# Patient Record
Sex: Male | Born: 1975 | Hispanic: Yes | Marital: Married | Smoking: Never smoker
Health system: Southern US, Community
[De-identification: ages and names within clinical notes are randomized; demographics above are authoritative.]

## PROBLEM LIST (undated history)

## (undated) ENCOUNTER — Telehealth

## (undated) ENCOUNTER — Encounter

## (undated) ENCOUNTER — Ambulatory Visit: Attending: Family | Primary: Family

## (undated) ENCOUNTER — Ambulatory Visit

## (undated) ENCOUNTER — Non-Acute Institutional Stay: Payer: BLUE CROSS/BLUE SHIELD

## (undated) ENCOUNTER — Ambulatory Visit: Payer: PRIVATE HEALTH INSURANCE

## (undated) ENCOUNTER — Encounter: Attending: Spinal Cord Injury Medicine | Primary: Spinal Cord Injury Medicine

## (undated) ENCOUNTER — Ambulatory Visit: Payer: BLUE CROSS/BLUE SHIELD

## (undated) ENCOUNTER — Encounter: Attending: Urology | Primary: Urology

## (undated) ENCOUNTER — Telehealth
Attending: Student in an Organized Health Care Education/Training Program | Primary: Student in an Organized Health Care Education/Training Program

## (undated) ENCOUNTER — Ambulatory Visit: Attending: Spinal Cord Injury Medicine | Primary: Spinal Cord Injury Medicine

## (undated) ENCOUNTER — Telehealth: Attending: Family | Primary: Family

## (undated) ENCOUNTER — Inpatient Hospital Stay

## (undated) ENCOUNTER — Telehealth: Attending: Clinical | Primary: Clinical

## (undated) ENCOUNTER — Ambulatory Visit
Attending: Student in an Organized Health Care Education/Training Program | Primary: Student in an Organized Health Care Education/Training Program

## (undated) ENCOUNTER — Telehealth: Attending: Urology | Primary: Urology

## (undated) ENCOUNTER — Encounter
Payer: BLUE CROSS/BLUE SHIELD | Attending: Rehabilitative and Restorative Service Providers" | Primary: Rehabilitative and Restorative Service Providers"

## (undated) ENCOUNTER — Encounter: Attending: Certified Registered" | Primary: Certified Registered"

## (undated) ENCOUNTER — Encounter: Attending: Physical Medicine & Rehabilitation | Primary: Physical Medicine & Rehabilitation

## (undated) ENCOUNTER — Ambulatory Visit
Payer: PRIVATE HEALTH INSURANCE | Attending: Rehabilitative and Restorative Service Providers" | Primary: Rehabilitative and Restorative Service Providers"

## (undated) DIAGNOSIS — B20 Human immunodeficiency virus [HIV] disease: Secondary | ICD-10-CM

## (undated) DIAGNOSIS — Z21 Asymptomatic human immunodeficiency virus [HIV] infection status: Secondary | ICD-10-CM

## (undated) MED ORDER — CRANBERRY 500 MG CAPSULE: Freq: Every day | ORAL | 0 days

---

## 2005-04-13 ENCOUNTER — Emergency Department: Payer: Self-pay | Admitting: Emergency Medicine

## 2005-07-29 ENCOUNTER — Emergency Department: Payer: Self-pay | Admitting: Emergency Medicine

## 2009-04-01 ENCOUNTER — Emergency Department: Payer: Self-pay | Admitting: Emergency Medicine

## 2018-12-09 ENCOUNTER — Emergency Department
Admission: EM | Admit: 2018-12-09 | Discharge: 2018-12-10 | Disposition: A | Discharge status: Other Acute Inpatient Hospital | Payer: HRSA Program | Attending: Student in an Organized Health Care Education/Training Program | Admitting: Student in an Organized Health Care Education/Training Program

## 2018-12-09 ENCOUNTER — Emergency Department: Payer: HRSA Program

## 2018-12-09 ENCOUNTER — Encounter: Payer: Self-pay | Admitting: Emergency Medicine

## 2018-12-09 ENCOUNTER — Other Ambulatory Visit: Payer: Self-pay

## 2018-12-09 DIAGNOSIS — U071 COVID-19: Secondary | ICD-10-CM | POA: Diagnosis not present

## 2018-12-09 DIAGNOSIS — L03116 Cellulitis of left lower limb: Secondary | ICD-10-CM | POA: Diagnosis not present

## 2018-12-09 DIAGNOSIS — M79673 Pain in unspecified foot: Secondary | ICD-10-CM | POA: Diagnosis present

## 2018-12-09 DIAGNOSIS — L03119 Cellulitis of unspecified part of limb: Secondary | ICD-10-CM

## 2018-12-09 DIAGNOSIS — R230 Cyanosis: Secondary | ICD-10-CM | POA: Insufficient documentation

## 2018-12-09 DIAGNOSIS — Z21 Asymptomatic human immunodeficiency virus [HIV] infection status: Secondary | ICD-10-CM | POA: Diagnosis not present

## 2018-12-09 DIAGNOSIS — A419 Sepsis, unspecified organism: Secondary | ICD-10-CM

## 2018-12-09 DIAGNOSIS — I96 Gangrene, not elsewhere classified: Secondary | ICD-10-CM

## 2018-12-09 DIAGNOSIS — B20 Human immunodeficiency virus [HIV] disease: Secondary | ICD-10-CM

## 2018-12-09 HISTORY — DX: Human immunodeficiency virus (HIV) disease: B20

## 2018-12-09 HISTORY — DX: Asymptomatic human immunodeficiency virus (hiv) infection status: Z21

## 2018-12-09 LAB — COMPREHENSIVE METABOLIC PANEL
ALT: 42 U/L (ref 0–44)
AST: 31 U/L (ref 15–41)
Albumin: 4 g/dL (ref 3.5–5.0)
Alkaline Phosphatase: 68 U/L (ref 38–126)
Anion gap: 7 (ref 5–15)
BUN: 13 mg/dL (ref 6–20)
CO2: 25 mmol/L (ref 22–32)
Calcium: 9.1 mg/dL (ref 8.9–10.3)
Chloride: 102 mmol/L (ref 98–111)
Creatinine, Ser: 0.61 mg/dL (ref 0.61–1.24)
GFR calc Af Amer: >60 mL/min (ref >60)
GFR calc non Af Amer: >60 mL/min (ref >60)
Glucose, Bld: 121 mg/dL — ABNORMAL HIGH (ref 70–99)
Potassium: 3.3 mmol/L — ABNORMAL LOW (ref 3.5–5.1)
Sodium: 134 mmol/L — ABNORMAL LOW (ref 135–145)
Total Bilirubin: 0.9 mg/dL (ref 0.3–1.2)
Total Protein: 9.2 g/dL — ABNORMAL HIGH (ref 6.5–8.1)

## 2018-12-09 LAB — CBC WITH DIFFERENTIAL/PLATELET
Abs Immature Granulocytes: 0.04 10*3/uL (ref 0.00–0.07)
Basophils Absolute: 0 10*3/uL (ref 0.0–0.1)
Basophils Relative: 0 %
Eosinophils Absolute: 1.2 10*3/uL — ABNORMAL HIGH (ref 0.0–0.5)
Eosinophils Relative: 16 %
HCT: 42.5 % (ref 39.0–52.0)
Hemoglobin: 14.1 g/dL (ref 13.0–17.0)
Immature Granulocytes: 1 %
Lymphocytes Relative: 37 %
Lymphs Abs: 2.6 10*3/uL (ref 0.7–4.0)
MCH: 27.2 pg (ref 26.0–34.0)
MCHC: 33.2 g/dL (ref 30.0–36.0)
MCV: 81.9 fL (ref 80.0–100.0)
Monocytes Absolute: 0.8 10*3/uL (ref 0.1–1.0)
Monocytes Relative: 11 %
Neutro Abs: 2.5 10*3/uL (ref 1.7–7.7)
Neutrophils Relative %: 35 %
Platelets: 212 10*3/uL (ref 150–400)
RBC: 5.19 MIL/uL (ref 4.22–5.81)
RDW: 13.2 % (ref 11.5–15.5)
WBC: 7.1 10*3/uL (ref 4.0–10.5)
nRBC: 0 % (ref 0.0–0.2)

## 2018-12-09 LAB — LACTIC ACID, PLASMA: Lactic Acid, Venous: 1.3 mmol/L (ref 0.5–1.9)

## 2018-12-09 LAB — SARS CORONAVIRUS 2 BY RT PCR (HOSPITAL ORDER, PERFORMED IN ~~LOC~~ HOSPITAL LAB): SARS Coronavirus 2: POSITIVE — AB

## 2018-12-09 MED ORDER — METRONIDAZOLE IN NACL 5-0.79 MG/ML-% IV SOLN
500.0000 mg | Freq: Once | INTRAVENOUS | Status: AC
  Administered 2018-12-09: 500 mg via INTRAVENOUS
  Filled 2018-12-09: qty 100

## 2018-12-09 MED ORDER — VANCOMYCIN HCL 1.5 G IV SOLR
1500.0000 mg | Freq: Once | INTRAVENOUS | Status: AC
  Administered 2018-12-09: 1500 mg via INTRAVENOUS
  Filled 2018-12-09: qty 1500

## 2018-12-09 MED ORDER — VANCOMYCIN HCL IN DEXTROSE 1-5 GM/200ML-% IV SOLN: 1000.0000 mg | Freq: Once | INTRAVENOUS | Status: DC

## 2018-12-09 MED ORDER — SODIUM CHLORIDE 0.9 % IV SOLN
2.0000 g | Freq: Once | INTRAVENOUS | Status: AC
  Administered 2018-12-09: 2 g via INTRAVENOUS
  Filled 2018-12-09: qty 2

## 2018-12-09 NOTE — ED Notes (Signed)
Pt sitting in lobby in w/c with no distress noted; per interpreter, pt reports left foot & leg pain/swelling x 3 days; noted cyanotic fingertips to left hand; pt reports after having dental work approx 48mos ago his fingers turned blue and the 3rd tip fell off; denies hx of diabetes and denies any f/u about such; charge nurse called for bed and acuity level changed

## 2018-12-09 NOTE — ED Notes (Signed)
Pt given water with MD permission.

## 2018-12-09 NOTE — Progress Notes (Signed)
PHARMACY -  BRIEF ANTIBIOTIC NOTE   Pharmacy has received consult(s) for Vancomycin and Cefepime from an ED provider.  The patient's profile has been reviewed for ht/wt/allergies/indication/available labs.    One time order(s) placed for Vancomycin 1500mg  and Cefepime 2000mg   Further antibiotics/pharmacy consults should be ordered by admitting physician if indicated.                       Thank you, Hart Robinsons A 12/09/2018  10:28 PM

## 2018-12-09 NOTE — ED Notes (Signed)
Pt states he is aware of and okay with pending transfer

## 2018-12-09 NOTE — ED Triage Notes (Signed)
Pt arrives with concerns over swelling and redness to pt's left foot. Pt reports the symptoms started 3 days prior. Pt also reports concerns over constipation. Pt reports he had a bowel movement today but states "it was just small."

## 2018-12-09 NOTE — ED Provider Notes (Signed)
[facility] Emergency Department Provider Note    First MD Initiated Contact with Patient 12/09/18 2006     (approximate)  I have reviewed the triage vital signs and the nursing notes.   HISTORY  Chief Complaint Foot Swelling and Constipation    HPI Lucile Didonato is a 43 y.o. male with a history of HIV not on antiretroviral medication presents the ER for evaluation of burning of bilateral feet for the past several days as well as decreased oral intake pain in his mouth some subjective fevers and chills as well as some crampy abdominal pain.  Patient also concerned because several months ago after having a dental procedure several days later developed coldness of 2 of his fingers and they have since necrosis off.  He is not seen a physician about this.  Patient does not have insurance and that is why I do not seek local care.  He was following or hoping to get established with [facility] but does not have transportation.    Past Medical History:  Diagnosis Date  . HIV (human immunodeficiency virus infection) ([place])    No family history on file. History reviewed. No pertinent surgical history. There are no active problems to display for this patient.     Prior to Admission medications   Not on File    Allergies Patient has no known allergies.    Social History Social History   Tobacco Use  . Smoking status: Never Smoker  . Smokeless tobacco: Never Used  Substance Use Topics  . Alcohol use: Never    Frequency: Never  . Drug use: Not on file    Review of Systems Patient denies headaches, rhinorrhea, blurry vision, numbness, shortness of breath, chest pain, edema, cough, abdominal pain, nausea, vomiting, diarrhea, dysuria, fevers, rashes or hallucinations unless otherwise stated above in HPI. ____________________________________________   PHYSICAL EXAM:  VITAL SIGNS: Vitals:   12/09/18 2230 12/09/18 2300  BP: (!) 144/98 (!)  146/96  Pulse: 95 (!) 101  Resp: 20 18  Temp:    SpO2: 99% 99%    Constitutional: Alert and oriented.  Eyes: Conjunctivae are normal.  Head: Atraumatic. Nose: No congestion/rhinnorhea. Mouth/Throat: Mucous membranes are moist.  Oral thrush Neck: No stridor. Painless ROM.  Cardiovascular: Normal rate, regular rhythm. Grossly normal heart sounds.  Good peripheral circulation. Respiratory: Normal respiratory effort.  No retractions. Lungs CTAB. Gastrointestinal: Soft and nontender. No distention. No abdominal bruits. No CVA tenderness. Genitourinary: deferred Musculoskeletal: Tinea pedis changes bilateral feet.  2+ PT and DP pulses.  No lower extremity tenderness nor edema.  No joint effusions. Neurologic:  Normal speech and language. No gross focal neurologic deficits are appreciated. No facial droop Skin:  Skin is warm, dry and intact. No rash noted. Psychiatric: Mood and affect are normal. Speech and behavior are normal.  ____________________________________________   LABS (all labs ordered are listed, but only abnormal results are displayed)  Results for orders placed or performed during the hospital encounter of 12/09/18 (from the past 24 hour(s))  CBC with Differential     Status: Abnormal   Collection Time: 12/09/18  6:12 PM  Result Value Ref Range   WBC 7.1 4.0 - 10.5 K/uL   RBC 5.19 4.22 - 5.81 MIL/uL   Hemoglobin 14.1 13.0 - 17.0 g/dL   HCT 42.5 39.0 - 52.0 %   MCV 81.9 80.0 - 100.0 fL   MCH 27.2 26.0 - 34.0 pg   MCHC 33.2 30.0 - 36.0 g/dL  RDW 13.2 11.5 - 15.5 %   Platelets 212 150 - 400 K/uL   nRBC 0.0 0.0 - 0.2 %   Neutrophils Relative % 35 %   Neutro Abs 2.5 1.7 - 7.7 K/uL   Lymphocytes Relative 37 %   Lymphs Abs 2.6 0.7 - 4.0 K/uL   Monocytes Relative 11 %   Monocytes Absolute 0.8 0.1 - 1.0 K/uL   Eosinophils Relative 16 %   Eosinophils Absolute 1.2 (H) 0.0 - 0.5 K/uL   Basophils Relative 0 %   Basophils Absolute 0.0 0.0 - 0.1 K/uL   Immature  Granulocytes 1 %   Abs Immature Granulocytes 0.04 0.00 - 0.07 K/uL  Comprehensive metabolic panel     Status: Abnormal   Collection Time: 12/09/18  6:12 PM  Result Value Ref Range   Sodium 134 (L) 135 - 145 mmol/L   Potassium 3.3 (L) 3.5 - 5.1 mmol/L   Chloride 102 98 - 111 mmol/L   CO2 25 22 - 32 mmol/L   Glucose, Bld 121 (H) 70 - 99 mg/dL   BUN 13 6 - 20 mg/dL   Creatinine, Ser 8.110.61 0.61 - 1.24 mg/dL   Calcium 9.1 8.9 - 91.410.3 mg/dL   Total Protein 9.2 (H) 6.5 - 8.1 g/dL   Albumin 4.0 3.5 - 5.0 g/dL   AST 31 15 - 41 U/L   ALT 42 0 - 44 U/L   Alkaline Phosphatase 68 38 - 126 U/L   Total Bilirubin 0.9 0.3 - 1.2 mg/dL   GFR calc non Af Amer >60 >60 mL/min   GFR calc Af Amer >60 >60 mL/min   Anion gap 7 5 - 15  SARS Coronavirus 2 [facility](Hospital order, Performed in [facility]) Nasopharyngeal Nasopharyngeal Swab     Status: Abnormal   Collection Time: 12/09/18  8:28 PM   Specimen: Nasopharyngeal Swab  Result Value Ref Range   SARS Coronavirus 2 POSITIVE (A) NEGATIVE  Lactic acid, plasma     Status: None   Collection Time: 12/09/18  8:28 PM  Result Value Ref Range   Lactic Acid, Venous 1.3 0.5 - 1.9 mmol/L   ____________________________________________  EKG My review and personal interpretation at Time: 20:26   Indication: gangrene of hand  Rate: 99  Rhythm: sinus Axis: normal Other: normal intervals, no stemi ____________________________________________  RADIOLOGY  I personally reviewed all radiographic images ordered to evaluate for the above acute complaints and reviewed radiology reports and findings.  These findings were personally discussed with the patient.  Please see medical record for radiology report.  ____________________________________________   PROCEDURES  Procedure(s) performed:  Procedures    Critical Care performed: no ____________________________________________   INITIAL IMPRESSION / ASSESSMENT AND PLAN / ED COURSE  Pertinent labs &  imaging results that were available during my care of the patient were reviewed by me and considered in my medical decision making (see chart for details).   DDX: gangrene, endocarditis, cellulitis, aids, sepsis covid  Ellison Carwinlvaro Torralba Aguilar is a 43 y.o. who presents to the ED with symptoms as described above.  She.  Patient has low-grade temperature.  Patient with concerning exam findings given evidence of gangrenous changes left hand.  Does have strong radial and ulnar pulses.  Abdominal exam is soft and benign.  Lower extremity appears to have chronic candidiasis but also a probable superimposed cellulitic changes of the left foot.  There triphasic DP and PT pulses to bilateral lower extremities.  Does have oral thrush.  Have a  high suspicion for AIDS.  Also possibility of endocarditis given necrotic changes to hands after dental procedure.  Blood will be sent for above differential.  Clinical Course as of Dec 08 2344  Wed Dec 09, 2018  2252 Patient found to be COVID positive.  Discussed this result with the patient.  I discussed case and results with patient.  Did [facility] medical management in the setting of his COVID-19 with gangrenous changes to his hand requiring Ortho consultation.  He [facility] as a try to establish care with them in the past.  Will [facility] with [facility] for [facility].   [PR]    Clinical Course User Index [PR] Willy Eddyobinson, Marrion Accomando, MD    The patient was evaluated in Emergency Department today for the symptoms described in the history of present illness. He/she was evaluated in the context of the global COVID-19 pandemic, which necessitated consideration that the patient might be at risk for infection with the SARS-CoV-2 virus that causes COVID-19. Institutional protocols and algorithms that pertain to the evaluation of patients at risk for COVID-19 are in a state of rapid change based on information  released by regulatory bodies including the CDC and federal and state organizations. These policies and algorithms were followed during the patient's care in the ED.  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    Willy Eddyobinson, Krisalyn Yankowski, MD 12/09/18 [phone]

## 2018-12-10 ENCOUNTER — Ambulatory Visit
Admit: 2018-12-10 | Discharge: 2019-01-22 | Disposition: A | Discharge status: Rehabilitation Facility | Payer: PRIVATE HEALTH INSURANCE | Source: Other Acute Inpatient Hospital

## 2018-12-10 ENCOUNTER — Ambulatory Visit
Admit: 2018-12-10 | Discharge: 2019-01-22 | Disposition: A | Discharge status: Rehabilitation Facility | Payer: BLUE CROSS/BLUE SHIELD | Source: Other Acute Inpatient Hospital

## 2018-12-10 ENCOUNTER — Encounter
Admit: 2018-12-10 | Discharge: 2019-01-22 | Disposition: A | Discharge status: Rehabilitation Facility | Payer: PRIVATE HEALTH INSURANCE | Source: Other Acute Inpatient Hospital | Attending: Anesthesiology

## 2018-12-10 ENCOUNTER — Encounter
Admit: 2018-12-10 | Discharge: 2019-01-22 | Disposition: A | Discharge status: Rehabilitation Facility | Payer: BLUE CROSS/BLUE SHIELD | Source: Other Acute Inpatient Hospital | Attending: Anesthesiology

## 2018-12-10 SURGERY — AMPUTATION, FOOT, RAY
Anesthesia: Choice | Laterality: Left

## 2018-12-10 MED ORDER — HEPARIN (PORCINE) IN NACL 25000-0.45 UT/250ML-% IV SOLN
18.00 | INTRAVENOUS | Status: DC
Start: ? — End: 2018-12-10

## 2018-12-10 MED ORDER — GENERIC EXTERNAL MEDICATION
Status: DC
Start: ? — End: 2018-12-10

## 2018-12-10 MED ORDER — GUAIFENESIN 100 MG/5ML PO LIQD
200.00 | ORAL | Status: DC
Start: ? — End: 2018-12-10

## 2018-12-10 MED ORDER — ALUM & MAG HYDROXIDE-SIMETH 400-400-40 MG/5ML PO SUSP
30.00 | ORAL | Status: DC
Start: ? — End: 2018-12-10

## 2018-12-10 MED ORDER — POLYETHYLENE GLYCOL 3350 17 G PO PACK
17.00 | PACK | ORAL | Status: DC
Start: 2018-12-15 — End: 2018-12-10

## 2018-12-10 MED ORDER — MELATONIN 3 MG PO TABS
3.00 | ORAL_TABLET | ORAL | Status: DC
Start: ? — End: 2018-12-10

## 2018-12-10 MED ORDER — HEPARIN SODIUM (PORCINE) 1000 UNIT/ML IJ SOLN
5000.00 | INTRAMUSCULAR | Status: DC
Start: ? — End: 2018-12-10

## 2018-12-10 MED ORDER — NALOXONE HCL 4 MG/10ML IJ SOLN
0.10 | INTRAMUSCULAR | Status: DC
Start: ? — End: 2018-12-10

## 2018-12-10 MED ORDER — NYSTATIN 100000 UNIT/ML MT SUSP
500000.00 | OROMUCOSAL | Status: DC
Start: 2018-12-14 — End: 2018-12-10

## 2018-12-10 MED ORDER — ACETAMINOPHEN 325 MG PO TABS
650.00 | ORAL_TABLET | ORAL | Status: DC
Start: ? — End: 2018-12-10

## 2018-12-11 LAB — HELPER T-LYMPH-CD4 (ARMC ONLY)
% CD 4 Pos. Lymph.: 8.6 % — ABNORMAL LOW (ref 30.8–58.5)
Absolute CD 4 Helper: 146 /uL — ABNORMAL LOW (ref 359–1519)
Basophils Absolute: 0 10*3/uL (ref 0.0–0.2)
Basos: 1 %
EOS (ABSOLUTE): 0.7 10*3/uL — ABNORMAL HIGH (ref 0.0–0.4)
Eos: 14 %
Hematocrit: 31.2 % — ABNORMAL LOW (ref 37.5–51.0)
Hemoglobin: 10.1 g/dL — ABNORMAL LOW (ref 13.0–17.7)
Immature Grans (Abs): 0 10*3/uL (ref 0.0–0.1)
Immature Granulocytes: 1 %
Lymphocytes Absolute: 1.7 10*3/uL (ref 0.7–3.1)
Lymphs: 36 %
MCH: 27.2 pg (ref 26.6–33.0)
MCHC: 32.4 g/dL (ref 31.5–35.7)
MCV: 84 fL (ref 79–97)
Monocytes Absolute: 0.4 10*3/uL (ref 0.1–0.9)
Monocytes: 9 %
Neutrophils Absolute: 1.9 10*3/uL (ref 1.4–7.0)
Neutrophils: 39 %
Platelets: 163 10*3/uL (ref 150–450)
RBC: 3.71 x10E6/uL — ABNORMAL LOW (ref 4.14–5.80)
RDW: 13.8 % (ref 11.6–15.4)
WBC: 4.7 10*3/uL (ref 3.4–10.8)

## 2018-12-11 LAB — HIV-1 RNA QUANT-NO REFLEX-BLD
HIV 1 RNA Quant: 1010000 copies/mL
LOG10 HIV-1 RNA: 6.004 log10copy/mL

## 2018-12-14 LAB — CULTURE, BLOOD (SINGLE)
Culture: NO GROWTH
Special Requests: ADEQUATE

## 2018-12-14 LAB — CULTURE, BLOOD (ROUTINE X 2)
Culture: NO GROWTH
Culture: NO GROWTH
Special Requests: ADEQUATE

## 2018-12-14 MED ORDER — GENERIC EXTERNAL MEDICATION
Status: DC
Start: ? — End: 2018-12-14

## 2018-12-14 MED ORDER — LORAZEPAM 2 MG/ML IJ SOLN
1.00 | INTRAMUSCULAR | Status: DC
Start: ? — End: 2018-12-14

## 2018-12-14 MED ORDER — ECONAZOLE NITRATE 1 % EX CREA
TOPICAL_CREAM | CUTANEOUS | Status: DC
Start: 2018-12-14 — End: 2018-12-14

## 2018-12-14 MED ORDER — GENERIC EXTERNAL MEDICATION
10.00 | Status: DC
Start: 2018-12-14 — End: 2018-12-14

## 2018-12-14 MED ORDER — Medication
5.00 | Status: DC
Start: 2018-12-14 — End: 2018-12-14

## 2018-12-14 MED ORDER — FLUCYTOSINE 500 MG PO CAPS
1500.00 | ORAL_CAPSULE | ORAL | Status: DC
Start: 2018-12-14 — End: 2018-12-14

## 2018-12-14 MED ORDER — BISACODYL 10 MG RE SUPP
10.00 | RECTAL | Status: DC
Start: ? — End: 2018-12-14

## 2018-12-14 MED ORDER — GLUCAGON HCL (DIAGNOSTIC) 1 MG IJ SOLR
1.00 | INTRAMUSCULAR | Status: DC
Start: ? — End: 2018-12-14

## 2018-12-14 MED ORDER — ENOXAPARIN SODIUM 30 MG/0.3ML ~~LOC~~ SOLN
30.00 | SUBCUTANEOUS | Status: DC
Start: 2018-12-14 — End: 2018-12-14

## 2018-12-14 MED ORDER — DEXTROSE 50 % IV SOLN
12.50 | INTRAVENOUS | Status: DC
Start: ? — End: 2018-12-14

## 2018-12-14 MED ORDER — GENERIC EXTERNAL MEDICATION
100.00 | Status: DC
Start: 2018-12-15 — End: 2018-12-14

## 2018-12-16 NOTE — Unmapped (Addendum)
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 A summary of his hospital stay by problem is listed below:     Cauda Equina syndrome:   Gradually worsening LE weakness/numbness??progressing to lower extremity paralysis 8/22, also urinary retention requiring foley catheter. MRI spine notable for multiple spinal cord lesions (increased T2??signal from T10??to conus medullaris). MRI brain with single enhancing brain lesion to the right corona radiata. He received empiric amphotericin 8/21-8/25, flucytosine 8/21-8/24. 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 LP 8/22 notable for elevated cells, lymphocytes, and protein, toxoplasmosis and EBV positive, negative cytology. Sulfadiazine, pyrimethamine, and leucovorin started 8/24 for toxoplasmosis. Repeat LP 8/25, again negative for malignant cells. CMV+ in the blood but not CSF. He received??ganciclovir 8/22-8/25.      He had a PET scan on 9/1 which revealed lymphadenopathy on concerning for lymphoma vs reactive process underwent left axillary excisional lymph node biopsy on 9/10.     Neurology consulted 9/11 and opined the since there had been no return to his leg function or sensation despite ongoing therapy for toxo, his neurologic deficits may be permanent. He received OT/PT starting 8/22.     Repeat brain MRI 9/21 demonstrated decreased size of the enhancing brain lesion, consistent with treatment-responsive toxoplasmosis and lowering our suspicion for CNS lymphoma.  ??  A spontaneous voiding trial was done on 9/24 and pt was unable to void on his own, so urology replaced the Foley. After discussing all options with the patient, we will move forward with transfer to [facility] (order placed 9/22).   ????????????????????  Dry gangrene L hand    Per patient, noted discoloration following an extraction of multiple teeth. 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 Dermatology consulted and felt that this was more suggestive of a macro-occlusive process (embolic versus coagulopathic versus vascular). Vascular surgery consulted and felt it was not due to occlusion since bilateral UE arterial PVLs were without any significant findings. TTE 8/20 showed no obvious vegetations. He had blood cultures obtained on 12/10/2018 which remained??negative. Ortho was consulted and performed bedside resection of exposed bone on the long finger on 8/20; the dressings were changed q3d afterwards. Rheumatology consulted on 8/24 and 9/18 and did not suspect vasculitis or other rheumatologic etiology. Benign hematology consulted on 9/14 and, after a full thorough lab workup, did not find any evidence of thrombophilia which would require full dose anticoagulation.   ??  Discoloration/desquamation bilateral feet  Patient began noticing color changes in his feet about a year ago but believed that it was getting acutely worse in the days prior to admission with increased pain and swelling. Received heparin drip on 8/21 with concern for arterial occlusion. 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 Bilateral LE arterial PVLs demonstrated 50-74% stenosis identified  in the right popliteal artery without waveform deterioration. ?? Vascular surgery was consulted and felt that, at this point, no acute intervention is likely needed from a surgical standpoint and the imaging findings would not explain the findings in his feet.??Skin biopsy showed cutaneous mycosis with marked neutrophilic scale crust consistent with dermatophyte infection. He started econazole cream on 8/21 with minimal improvement for 2 weeks. Oral itraconazole started 9/14.     Tinea pedis Scaly plaques on dorsal surface bilateral feet, s/p biopsy growing trichophyton species. Skin biopsy showed cutaneous mycosis with marked neutrophilic scale crust consistent with dermatophyte infection. CSF fungal culture negative.   - Continue econazole cream to bilateral feet BID (started 12/11/2018)  - PO itraconazole started 9/14  - If fails topical antifungals, can consider trial of topical steroids   - Systemic treatment with antifungals for onychomycosis can be pursued as an outpatient if desired  ??  Untreated HIV infection; AIDS  Diagnosed in 2007 and is treatment naive (he declined treatment then and in 2013 for religious reasons). His CD4 count was 142 and VL was 341,000 on admission. He started tivacay/descovy 8/26.??CD4 count 9/17 improved to 231. Plan for switch to [facility] on discharge. Dr. Dolores Frame has initiated process to coordinate outpatient follow-up and drug assistance enrollment. He received nystatin swishes for oral thrush.   - Will need bactrim prophylaxis for PJP (hold for now)  - Negative labs: serum parvovirus PCR, Quant gold  - Positive labs: serum CMV PCR, CSF EBV PCR, CSF Toxoplasma PCR  ??  Eosinophilia and strongyloidiasis   He has had eosinophilia (900 to 1000) as far back as 10/2010, per record review; this remains stable at this time. His??stool O&P, GIPP were??negative.??Strongyloid Ab??positive on??8/21. S/p 2 courses of ivermectin completed 9/13. Genital Herpes  Penile lesions identified by nurse on 9/16. Exam revealed 0.25mm and 0.71mm firm raised lesions with central ulceration under the dorsal foreskin. Pt thinks they appeared a week ago when he noticed some burning but was not sure. Denied pain, urethral dischange, hx of other penile lesions or STIs. No inguinal lymphadenopathy. 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 He was put on a 10 day course of valacyclovir 1000mg  bid. On 9/20 the lesions seemed to be improving, and on 9/24 they were almost completely resolved.    COVID-19 infection  Asymptomatic infection confirmed at OSH before transfer to [facility]. 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 Patient was 21 days from positive PCR on 9/9 and precautions were discontinued.   ??  Artificial hypoglycemia  He had a random POC glucose check on the morning of 12/12/2018 that was low at 50.??He was asymptomatic at the time and rebounded to 115 with consumption of juice. Over the course of the following 24 hours, he continued to have low BGs on POC checks, despite being completely asymptomatic and receiving dextrose-containing IV fluids. Blood glucose checked via venipuncture and found to be 340 post interventions (D50 and D10). Accu check re-performed via 1) venipuncture and 2) ear lobe with BG resulting as 340 and 321, respectively. He is to have future POC BG checks from his ear. ??  ??  Elevated aminotransferases: His AST and ALT increased on 12/12/2018 (142 and 94, respectively), one day after Remdesivir was initiated on 12/11/2018. Resolved on 9/1 and remained wnl for the rest of his stay.

## 2018-12-25 DIAGNOSIS — B353 Tinea pedis: Secondary | ICD-10-CM

## 2018-12-25 DIAGNOSIS — B20 Human immunodeficiency virus [HIV] disease: Secondary | ICD-10-CM

## 2018-12-25 DIAGNOSIS — B5889 Toxoplasmosis with other organ involvement: Secondary | ICD-10-CM

## 2018-12-25 DIAGNOSIS — D721 Eosinophilia, unspecified: Secondary | ICD-10-CM

## 2018-12-25 DIAGNOSIS — R202 Paresthesia of skin: Secondary | ICD-10-CM

## 2018-12-25 DIAGNOSIS — I96 Gangrene, not elsewhere classified: Secondary | ICD-10-CM

## 2018-12-25 DIAGNOSIS — U071 COVID-19: Secondary | ICD-10-CM

## 2018-12-25 DIAGNOSIS — E785 Hyperlipidemia, unspecified: Secondary | ICD-10-CM

## 2018-12-25 DIAGNOSIS — R339 Retention of urine, unspecified: Secondary | ICD-10-CM

## 2018-12-25 DIAGNOSIS — Z89022 Acquired absence of left finger(s): Secondary | ICD-10-CM

## 2018-12-25 DIAGNOSIS — I70203 Unspecified atherosclerosis of native arteries of extremities, bilateral legs: Secondary | ICD-10-CM

## 2018-12-25 DIAGNOSIS — G373 Acute transverse myelitis in demyelinating disease of central nervous system: Secondary | ICD-10-CM

## 2018-12-25 DIAGNOSIS — G4700 Insomnia, unspecified: Secondary | ICD-10-CM

## 2018-12-25 DIAGNOSIS — B370 Candidal stomatitis: Secondary | ICD-10-CM

## 2018-12-25 DIAGNOSIS — B351 Tinea unguium: Secondary | ICD-10-CM

## 2018-12-25 DIAGNOSIS — B789 Strongyloidiasis, unspecified: Secondary | ICD-10-CM

## 2018-12-25 DIAGNOSIS — E162 Hypoglycemia, unspecified: Secondary | ICD-10-CM

## 2018-12-25 DIAGNOSIS — R32 Unspecified urinary incontinence: Secondary | ICD-10-CM

## 2018-12-25 DIAGNOSIS — K592 Neurogenic bowel, not elsewhere classified: Secondary | ICD-10-CM

## 2018-12-25 DIAGNOSIS — G834 Cauda equina syndrome: Secondary | ICD-10-CM

## 2018-12-25 DIAGNOSIS — G8220 Paraplegia, unspecified: Secondary | ICD-10-CM

## 2018-12-25 DIAGNOSIS — D649 Anemia, unspecified: Secondary | ICD-10-CM

## 2018-12-25 DIAGNOSIS — E876 Hypokalemia: Secondary | ICD-10-CM

## 2018-12-31 DIAGNOSIS — E876 Hypokalemia: Secondary | ICD-10-CM

## 2018-12-31 DIAGNOSIS — B20 Human immunodeficiency virus [HIV] disease: Secondary | ICD-10-CM

## 2018-12-31 DIAGNOSIS — U071 COVID-19: Secondary | ICD-10-CM

## 2018-12-31 DIAGNOSIS — I96 Gangrene, not elsewhere classified: Secondary | ICD-10-CM

## 2018-12-31 DIAGNOSIS — B351 Tinea unguium: Secondary | ICD-10-CM

## 2018-12-31 DIAGNOSIS — G834 Cauda equina syndrome: Secondary | ICD-10-CM

## 2018-12-31 DIAGNOSIS — D721 Eosinophilia, unspecified: Secondary | ICD-10-CM

## 2018-12-31 DIAGNOSIS — E162 Hypoglycemia, unspecified: Secondary | ICD-10-CM

## 2018-12-31 DIAGNOSIS — G373 Acute transverse myelitis in demyelinating disease of central nervous system: Secondary | ICD-10-CM

## 2018-12-31 DIAGNOSIS — G8220 Paraplegia, unspecified: Secondary | ICD-10-CM

## 2018-12-31 DIAGNOSIS — B789 Strongyloidiasis, unspecified: Secondary | ICD-10-CM

## 2018-12-31 DIAGNOSIS — D649 Anemia, unspecified: Secondary | ICD-10-CM

## 2018-12-31 DIAGNOSIS — I70203 Unspecified atherosclerosis of native arteries of extremities, bilateral legs: Secondary | ICD-10-CM

## 2018-12-31 DIAGNOSIS — E785 Hyperlipidemia, unspecified: Secondary | ICD-10-CM

## 2018-12-31 DIAGNOSIS — R32 Unspecified urinary incontinence: Secondary | ICD-10-CM

## 2018-12-31 DIAGNOSIS — G4700 Insomnia, unspecified: Secondary | ICD-10-CM

## 2018-12-31 DIAGNOSIS — R339 Retention of urine, unspecified: Secondary | ICD-10-CM

## 2018-12-31 DIAGNOSIS — B5889 Toxoplasmosis with other organ involvement: Secondary | ICD-10-CM

## 2018-12-31 DIAGNOSIS — Z89022 Acquired absence of left finger(s): Secondary | ICD-10-CM

## 2018-12-31 DIAGNOSIS — R202 Paresthesia of skin: Secondary | ICD-10-CM

## 2018-12-31 DIAGNOSIS — B370 Candidal stomatitis: Secondary | ICD-10-CM

## 2018-12-31 DIAGNOSIS — B353 Tinea pedis: Secondary | ICD-10-CM

## 2018-12-31 DIAGNOSIS — K592 Neurogenic bowel, not elsewhere classified: Secondary | ICD-10-CM

## 2019-01-02 NOTE — Unmapped (Signed)
Medicine K Progress Note      Assessment/Plan:  Principal Problem:    Gangrene (CMS-HCC)  Active Problems:    Thrush    COVID-19 virus infection    History of dental problems    Lower extremity edema    HIV (human immunodeficiency virus infection) (CMS-HCC)    Bilateral leg weakness    Urinary retention with bladder stretch injury  Resolved Problems:    * No resolved hospital problems. Don [name]??is a 43 y.o.??male??with??previously untreated HIV/AIDS??(CD4 142 on admission) who initially presented with 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 Workup notable for Toxoplasma and EBV in his CSF, as well as CMV+ low level in the peripheral blood but negative in CSF. He was most recently found to have significant diffuse  lymphadenopathy on PET scan concerning for lymphoma and is now s/p left axillary excisional lymph node biopsy on 9/10.   ??  Cauda Equina syndrome  MRI consistent with transverse myelitis, possibly secondary to toxoplasmosis; however, his LE paralysis and numbness have persisted despite several weeks of treatment for toxoplasmosis, increasing our suspicion for lymphoma. Similar to yesterday, he could feel some pressure when I squeezed his toes and legs but no sensation to touch and 0/5 strength. Axillary lymph node pathology and micro results are pending. Neurology consulted on 9/11 about possibly starting steroids for his transverse myelitis, feel unlikely to be of benefit at this time. They also expressed concern that given his lack of LE neurologic improvement so far, his neurologic deficits may be permanent.   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  Dry gangrene L hand    Etiology remains unclear. Dermatology suspected macro-occlusive process (embolic vs coagulopathic vs vascular). Bilateral UE arterial PVLs without any HD-significant findings and blood cultures obtained in the ED were negative. Rheumatology did not suspect vasculitis. It is possible that Don [name] has an acquired underlying coagulopathy. HIV is known to cause coagulopathy and COVID-19 coagulopathy is being increasingly characterized in the literature Don [name] al, Crit Care, Jun 2020). Autoimmune causes of coagulopathies could be considered as well. Vascular surgery consulted 8/21 and said unlikely due to vascular cause.  -Benign hematology consulted yesterday afternoon and will evaluate the patient on Monday  -Continue dressing changes   -Outpatient follow-up in hand clinic  ??  Discoloration bilateral lower extremities   This appears to be progressing with gangrenous changes now evident when compared to the photos taken on admission. Etiology remains unclear. Feet remain dusky and edematous with 1+ pulses. Bilateral LE venous PVLs were negative for DVTs. 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 Vascular surgery was consulted on 8/21 and felt that no acute intervention was needed at that time, but we should have them take another look given the progression and weak pulses. Rheumatology did not suspect vasculitis.   - Vascular surgery consult   - Benign hematology consult on Monday  ??  Tinea pedis   Trichophyton confirmed on biopsy. The bottom of the  feet look good.  - Continue econazole cream to bilateral feet BID (started 12/11/2018)  - If fails topical antifungals, can consider trial of topical steroids   - Systemic treatment with antifungals for onychomycosis can be pursued as an outpatient if desired  ??  Untreated HIV infection; AIDS    Diagnosed in 2007 and was treatment naive on admission.??His CD4 count was 142 and VL was 341,000 (12/10/18). ??  - Started Tivacay/Descovy 8/26.??Plan for switch to [facility] on discharge. Dr. Dolores [name] has initiated process to coordinate outpatient follow-up and drug assistance enrollment.   - Will need bactrim prophylaxis for PJP (hold for now)  ??  Eosinophilia and strongyloidiasis   He has had eosinophilia (900 to 1000) as far back as 10/2010, per record review; this remains stable at this time. His??stool O&P, GIPP were??negative.??Strongy Ab + 8/21  - Repeat ivermectin 200 mcg/kg/day for 2 days starting today (this is the standard treatment timeline to cover for possible autoinoculation)  ??  COVID-19 infection  Asymptomatic. Completed 5 days remdesivir.??  Date of positive SARS-CoV-2 PCR: 12/09/2018 at OSH  - Patient was 21 days from positive PCR on 9/9 - and precautions discontinued.   ??  Artificial hypoglycemia  He is to have future POC BG checks from his ear. ??    ??  Checklist  Antibiotics:??Pyrimethamine, leucovorin, sulfadiazine   Steroids: None  Bowel regimen: Miralax, bisacodyl supp prn  Diet: Regular  Foley: Placed 12/10/2018  Access: RUE PICC placed 12/14/2018  Code status: Full  Healthcare proxy: Don [name], wife, [phone]  ??    ___________________________________________________________________    Subjective:   Surgical incision is feeling better with minimal pain. Did not sleep well last night - states that he was up thinking a lot. Otherwise, pt states that he is doing well.    Labs/Studies:  Labs and Studies from the last 24hrs per EMR and Reviewed    Wound 12/11/18 Other (comment) Finger (Comment which one) Mid;Left ischemia in left middle finger Unstageable (Active)   Dressing Status      Intact/not removed 12/21/18 0840   State of Healing Eschar 12/20/18 2000   Wound Bed White;Black;Red 12/20/18 1838   Odor None 12/18/18 1300   Peri-wound Assessment      UTA 12/20/18 2000   Exudate Type      Sanguineous 12/18/18 1300   Exudate Amnt      Small 12/18/18 1300   Treatments Cleansed/Irrigation 12/18/18 1300   Dressing Xeroform gauze 12/20/18 2000       Wound 12/18/18 Post-Surgical Foot Left;Anterior biopsy (Active)   Dressing Status      No dressing 12/21/18 0840   State of Healing Healing ridge 12/18/18 1300   Wound Bed Red;Black 12/18/18 2000   Odor None 12/18/18 2000   Peri-wound Assessment      Clean;Dry;Intact 12/20/18 2000   Exudate Amnt      None 12/18/18 1300   Tunneling      No 12/18/18 1300   Undermining     No 12/18/18 1300   Dressing Open to air 12/21/18 0840          Objective:  Temp:  [36.7 ??C (98.1 ??F)-37.2 ??C (99 ??F)] 36.8 ??C (98.2 ??F)  Heart Rate:  [84-94] 84  Resp:  [16-18] 16  BP: (118-126)/(69-76) 120/76  SpO2:  [96 %-99 %] 98 %     GEN: Sitting in bed, No acute distress  HEENT: Mucous membranes moist  CV: RRR, no murmurs. Extremities warm (leg warmers  on), 2+ radial pulses, very weak dorsalis pedis pulses  PULM: Normal WOB, no wheezing or crackles  ABD: Soft, non-tender, non-distended, no rebound or guarding  EXT:??Necrotic digits left hand wrapped, discoloration 2nd digit right hand, dusky appearance bilateral lower extremities with necrotic changes bilaterally  NEURO: Alert and oriented x 3.  0/5 strength in lower extremities.  Has sensation to light touch down to just above knees, no sensation to light touch below. Felt some pressure-like sensation to firm touch/squeeze in toes and lower legs.    GU: Foley in place.       Eustaquio Boyden - MS3      Charise Killian, MD  PGY-1, Internal Medicine  Pager #: [phone]

## 2019-01-04 NOTE — Unmapped (Signed)
Infectious Disease (Don [name]) Progress Note    Subjective and 24-hour Events:   No acute events overnight. Pt has no complaints today. Lymph node bx incision is healing well and is not causing pain. Still with some minor pain in his necrosed 3rd digit on L hand, unchanged from prior.      Assessment & Plan:   Don [name]??is a 43 y.o.??male??with??previously untreated HIV/AIDS??(CD4 142 on admission) who initially presented with dry gangrene to the 2nd and 3rd digits of L hand and pain/weakness/discoloration over his lower extremities, subsequently found to be COVID??+ without respiratory difficulties (now off precautions), hospital course complicated by cauda equina syndrome leading to LE paralysis with urinary retention, found to have patchy hyperintensities in the spine and brain on MRI c/w transverse myelitis. Workup notable for Toxoplasma and EBV in his CSF, as well as CMV+ low level in the peripheral blood but negative in CSF. He was most recently found to have significant diffuse lymphadenopathy on PET scan concerning for lymphoma and is now s/p left axillary excisional lymph node biopsy on 9/10.   ??  Cauda Equina syndrome  MRI consistent with transverse myelitis, possibly secondary to toxoplasmosis; however, his LE paralysis and numbness have persisted despite several weeks of treatment for toxoplasmosis, increasing our suspicion for lymphoma. Surgical pathology contacted today and they will add the AFB and silver stain to the lymph node tissue sample.  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  Anticipate a 4 to 6-week course and then transition to Bactrim after completion for PJP prophylaxis.  - f/u excisional lymph node pathology (9/10) to rule-out lymphoma  - f/u arboviral antibody panel   ????????????????????  Dry gangrene L hand    Etiology remains unclear. Dermatology suspected macro-occlusive process (embolic vs coagulopathic vs vascular). He states that he noticed color changes in his fingers following extraction of several teeth and the gangrene got progressively worse after that. TTE did not show any vegetations indicative of endocarditis, though it is possible that there was a small vegetations too small to visualize. Bilateral UE arterial PVLs without any HD-significant findings and blood cultures obtained in the ED were negative. Rheumatology did not suspect vasculitis, although the pt was found to be ANA+ and ANCA indeterminate and now with uptrending ESR and CRP, so we will plan to reengage them. It is also possible that Mr. Don [name] has an acquired underlying coagulopathy. HIV is known to cause coagulopathy. While Don [name] coagulopathy is being increasingly characterized in the literature Don [name] al, Crit Care, Jun 2020), his digit necrosis preceded his COVID infection by several months. Vascular surgery consulted 8/21 and again on 9/13 and determined that it was likely not vascular in etiology. Hematology consulted today and while they acknowledge that he is a thrombotic risk due to his HIV and several concomitant infections, they do not recommend full anticoagulation due to negative hypercoagulability workup so far and no proof of thrombosis. They recommend checking protein C, S and antithrombin III just to complete the full workup. However, they do agree that he has early atherosclerotic disease and recommended baby aspirin and agree with starting a statin. They also recommend closing the loop with rheumatology.  - Protein C and S activity, antithrombin III  - Consult rheumatology on Tuesday AM  - Outpatient follow-up in hand clinic   ??  Discoloration bilateral lower extremities   Etiology remains unclear. Upon further close review with the infectious disease team, this does not appear to be gangrene. His DP and PT pulses  are palpable and the feet are warm, which is reassuring. The plantar surface of the feet look good. 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 Given that the skin lesions seem restricted to the dorsum of the foot, this may just be a severe dermatophyte infection in a highly immunocompromised host. We will plan to start oral antifungals and see how he does. This should also help with his onychomycosis.   - Start itroconazole 200mg  BID  - Continue to monitor  ??  Tinea pedis   Trichophyton confirmed on biopsy.   - Itraconazole as above  - Continue econazole cream to bilateral feet BID (started 12/11/2018)  ??  Untreated HIV infection; AIDS    Diagnosed in 2007 and was treatment naive on admission.??His CD4 count was 142 and VL was 341,000 (12/10/18). ??  - Started Tivacay/Descovy 8/26.??Plan for switch to [facility] on discharge. Dr. Dolores [name] has initiated process to coordinate outpatient follow-up and drug assistance enrollment.   - Will need bactrim prophylaxis for PJP (hold for now)    ASCVD Risk  Pooled Cohort 10-yr risk is 3%, however, HIV is an independent risk factor for CVD which is not included in the risk score. Furthermore, he already has significant atherosclerotic disease in his lower extremities and dyslipidemia. Given this, we feel it is appropriate to start him on a statin. We don't have an A1c either so we should r/o diabetes as well.  - start atorvastatin 20mg  qd  - start aspirin 81mg  qd  - check A1c  ??  Eosinophilia and strongyloidiasis   He has had eosinophilia (900 to 1000) as far back as 10/2010, per record review; this remains stable at this time. His??stool O&P, GIPP were??negative.??Strongyloid Ab??+??8/21  -S/p course of ivermectin completed 9/13  ??  Don [name] infection  Asymptomatic. Completed 5 days remdesivir.??  Date of positive SARS-CoV-2 PCR: 12/09/2018 at OSH  - Patient was 21 days from positive PCR on 9/9 - and precautions discontinued.  ??    Lines  - PICC removed yesterday  - PIV placed    Objective:   Temp:  [36.9 ??C (98.4 ??F)-37.1 ??C (98.8 ??F)] 36.9 ??C (98.4 ??F)  Heart Rate:  [68-88] 88  Resp:  [16-18] 18  BP: (112-130)/(67-81) 130/81  SpO2:  [98 %-99 %] 99 %    General: No acute distress, alert, oriented, answers questions appropriately  HEENT: atraumatic, sclera anicteric, mucus membranes moist. oropharynx w/o erythema or exudate   Heart: Regular rate and rhythm, S1, S2, no murmer, rubs, or gallops, no chest wall tenderness  Lungs: Clear to auscultation bilaterally, no crackles or wheezes, no use of accessory muscles  Abdomen: Normoactive bowel sounds, soft, non-tender, non-distended, no rebound/guarding  Extremities: Black discoloration of the toes bilaterally with desquamation of thickened black epidermis. Weak DP and TP pulses though still palpable.  Left hand bandaged over amputation site.  Psych: Appropriate mood and affect        Labs/Studies: Labs and studies reviewed and pertinent information is addressed in the plan      Eustaquio Boyden, MS3      I attest that I have reviewed the student note and that the components of the history of the present illness, the physical exam, and the assessment and plan documented were performed by me or were performed in my presence by the student where I verified the documentation and performed (or re-performed) the exam and medical  decision making.    Charise Killian, MD  PGY-1, Internal Medicine

## 2019-01-05 NOTE — Unmapped (Signed)
Infectious Disease ([facility]) Progress Note    Subjective and 24-hour Events:   No acute events overnight. Pt has no complaints today. Still with some minor pain in his necrosed 3rd digit on L hand.       Assessment & Plan:   Don [name]??is a 43 y.o.??male??with??previously untreated HIV/AIDS??(CD4 142 on admission) who initially presented with dry gangrene to the 2nd and 3rd digits of L hand and pain/weakness/discoloration over his lower extremities, subsequently found to be COVID??+ without respiratory difficulties (now off precautions), hospital course complicated by cauda equina syndrome leading to LE paralysis with urinary retention, found to have patchy hyperintensities in the spine and brain on MRI c/w transverse myelitis. Workup notable for Toxoplasma and EBV in his CSF, as well as CMV+ low level in the peripheral blood but negative in CSF. He was most recently found to have significant diffuse lymphadenopathy on PET scan concerning for lymphoma and is now s/p left axillary excisional lymph node biopsy on 9/10.   ??  Cauda Equina syndrome  MRI consistent with transverse myelitis, possibly secondary to toxoplasmosis; however, his LE paralysis and numbness have persisted despite several weeks of treatment for toxoplasmosis, increasing our suspicion for lymphoma or neuronal scar formation from toxo infection. Surgical pathology contacted today and they will add the AFB and silver stain to the lymph node tissue sample.  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  Anticipate a 4 to 6-week course and then transition to Bactrim after completion for PJP prophylaxis.  - f/u excisional lymph node pathology (9/10) to rule-out lymphoma  - f/u arboviral antibody panel (collected 8/22)  ????????????????????  Dry gangrene L hand    Etiology remains unclear. Dermatology suspected macro-occlusive process (embolic vs coagulopathic vs vascular). He states that he noticed color changes in his fingers following extraction of several teeth and the gangrene got progressively worse after that. Echo did not show any vegetations indicative of endocarditis, though it is possible that there was a small vegetations too small to visualize. Bilateral UE arterial PVLs without any HD-significant findings and blood cultures obtained in the ED were negative. Rheumatology did not suspect vasculitis, although the pt was found to be ANA+ and ANCA indeterminate and now with uptrending ESR and CRP, so we will plan to reengage them. It is also possible that [name] has an acquired underlying coagulopathy. HIV is known to cause coagulopathy. While COVID-19 coagulopathy is being increasingly characterized in the literature [name] al, Crit Care, Jun 2020), his digit necrosis preceded his COVID infection by several months. Vascular surgery consulted 8/21 and again on 9/13 and determined that it was likely not vascular in etiology. Hematology consulted today and while they acknowledge that he is a thrombotic risk due to his HIV and several concomitant infections, they do not recommend full anticoagulation due to negative hypercoagulability workup so far and no proof of thrombosis. They recommend checking protein C, S and antithrombin III just to complete the full workup. However, they do agree that he has early atherosclerotic disease and recommended baby aspirin and agree with starting a statin. They also recommend closing the loop with rheumatology.  - f/u Protein C and S activity, antithrombin III to complete thrombophilia workup.   - Rheumatology consulted this AM, awaiting their input  - Outpatient follow-up in hand clinic   - His bandage on 3rd digit was crusted onto to the wound today. Changed bandage change frequency to daily and advised that they may need to use saline to remove  it  ??  Discoloration bilateral lower extremities   Etiology remains unclear. Upon further close review with the infectious disease team, this does not appear to be gangrene. His DP and PT pulses are palpable and the feet are warm, which is reassuring. The plantar surface of the feet look good. 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 Given that the skin lesions seem restricted to the dorsum of the foot, this may just be a severe dermatophyte infection in a highly immunocompromised host. We will plan to start oral antifungals and see how he does. This should also help with his onychomycosis.   - Continue itroconazole 200mg  BID  - Continue to monitor  ??  Tinea pedis   Trichophyton confirmed on biopsy.   - Itraconazole as above  - Continue econazole cream to bilateral feet BID (started 12/11/2018)  ??  Untreated HIV infection; AIDS    Diagnosed in 2007 and was treatment naive on admission (had previously declined ART due to religious reasons).??His CD4 count was 142 and VL was 341,000 (8/20). He was noted to have oral thrush.??  - Started Tivacay/Descovy 8/26.??Plan for switch to [facility] on discharge. Dr. Dolores Frame has initiated process to coordinate outpatient follow-up and drug assistance enrollment.   - Will need bactrim prophylaxis for PJP (hold for now)   - Continue nystatin swishes for oral thrush    ASCVD Risk  Pooled Cohort 10-yr risk is 3%, however, HIV is an independent risk factor for CVD which is not included in the risk score. Furthermore, he already has significant atherosclerotic disease in his lower extremities and dyslipidemia. Given this, we feel it is appropriate to start him on a statin. A1c 5.5.   - continue atorvastatin 20mg  qd  - continue aspirin 81mg  qd  ??  Eosinophilia and strongyloidiasis   He has had eosinophilia (900 to 1000) as far back as 10/2010, per record review; this remains stable at this time. His??stool O&P, GIPP were??negative.??Strongyloid Ab??positive on??8/21.  -S/p course of ivermectin completed 9/13 COVID-19 infection  Asymptomatic. Completed 5 days remdesivir.??  Date of positive SARS-CoV-2 PCR: 12/09/2018 at OSH  - Patient was 21 days from positive PCR on 9/9 - and precautions discontinued.  ??    Lines  - PICC removed yesterday  - PIV placement today    Objective:   Temp:  [36.8 ??C (98.2 ??F)-36.9 ??C (98.4 ??F)] 36.9 ??C (98.4 ??F)  Heart Rate:  [78-91] 78  Resp:  [14-18] 14  BP: (107-130)/(65-81) 107/65  SpO2:  [98 %-100 %] 100 %    General: No acute distress, alert, oriented, answers questions appropriately  HEENT: atraumatic, sclera anicteric, mucus membranes moist. oropharynx w/o erythema or exudate   Heart: Regular rate and rhythm, S1, S2, no murmer, rubs, or gallops, no chest wall tenderness  Lungs: Clear to auscultation bilaterally, no crackles or wheezes, no use of accessory muscles  Abdomen: Normoactive bowel sounds, soft, non-tender, non-distended, no rebound/guarding  Extremities: Black discoloration of the toes bilaterally with desquamation of thickened black epidermis. Weak DP and TP pulses though still palpable. Feet are warm. Left hand bandaged over amputation site.  Neuro: Alert and oriented x 3.  0/5 strength in lower extremities.  Has sensation to light touch down to just above knees, no sensation to light touch below.  Psych: Appropriate mood and affect        Labs/Studies: Labs and studies reviewed and pertinent  information is addressed in the plan      Eustaquio Boyden, MS3      I attest that I have reviewed the student note and that the components of the history of the present illness, the physical exam, and the assessment and plan documented were performed by me or were performed in my presence by the student where I verified the documentation and performed (or re-performed) the exam and medical decision making.    Charise Killian, MD  PGY-1, Internal Medicine

## 2019-01-06 DIAGNOSIS — B5889 Toxoplasmosis with other organ involvement: Secondary | ICD-10-CM

## 2019-01-06 DIAGNOSIS — B351 Tinea unguium: Secondary | ICD-10-CM

## 2019-01-06 DIAGNOSIS — I96 Gangrene, not elsewhere classified: Secondary | ICD-10-CM

## 2019-01-06 DIAGNOSIS — B789 Strongyloidiasis, unspecified: Secondary | ICD-10-CM

## 2019-01-06 DIAGNOSIS — G373 Acute transverse myelitis in demyelinating disease of central nervous system: Secondary | ICD-10-CM

## 2019-01-06 DIAGNOSIS — E162 Hypoglycemia, unspecified: Secondary | ICD-10-CM

## 2019-01-06 DIAGNOSIS — G4700 Insomnia, unspecified: Secondary | ICD-10-CM

## 2019-01-06 DIAGNOSIS — Z89022 Acquired absence of left finger(s): Secondary | ICD-10-CM

## 2019-01-06 DIAGNOSIS — D649 Anemia, unspecified: Secondary | ICD-10-CM

## 2019-01-06 DIAGNOSIS — B370 Candidal stomatitis: Secondary | ICD-10-CM

## 2019-01-06 DIAGNOSIS — R202 Paresthesia of skin: Secondary | ICD-10-CM

## 2019-01-06 DIAGNOSIS — R32 Unspecified urinary incontinence: Secondary | ICD-10-CM

## 2019-01-06 DIAGNOSIS — B20 Human immunodeficiency virus [HIV] disease: Secondary | ICD-10-CM

## 2019-01-06 DIAGNOSIS — G834 Cauda equina syndrome: Secondary | ICD-10-CM

## 2019-01-06 DIAGNOSIS — K592 Neurogenic bowel, not elsewhere classified: Secondary | ICD-10-CM

## 2019-01-06 DIAGNOSIS — R339 Retention of urine, unspecified: Secondary | ICD-10-CM

## 2019-01-06 DIAGNOSIS — B353 Tinea pedis: Secondary | ICD-10-CM

## 2019-01-06 DIAGNOSIS — D721 Eosinophilia, unspecified: Secondary | ICD-10-CM

## 2019-01-06 DIAGNOSIS — E785 Hyperlipidemia, unspecified: Secondary | ICD-10-CM

## 2019-01-06 DIAGNOSIS — I70203 Unspecified atherosclerosis of native arteries of extremities, bilateral legs: Secondary | ICD-10-CM

## 2019-01-06 DIAGNOSIS — U071 COVID-19: Secondary | ICD-10-CM

## 2019-01-06 DIAGNOSIS — E876 Hypokalemia: Secondary | ICD-10-CM

## 2019-01-06 DIAGNOSIS — G8220 Paraplegia, unspecified: Secondary | ICD-10-CM

## 2019-01-06 MED ORDER — BICTEGRAVIR 50 MG-EMTRICITABINE 200 MG-TENOFOVIR ALAFENAM 25 MG TABLET
ORAL_TABLET | Freq: Every day | ORAL | 6 refills | 30 days | Status: SS
Start: 2019-01-06 — End: ?

## 2019-01-06 NOTE — Unmapped (Signed)
Infectious Disease ([facility]) Progress Note    Subjective and 24-hour Events:   No acute events overnight. Pt has no complaints today. Nurse noted a penile lesion under the foreskin early this morning. Pt states it has been there for a week, but he does not touch down there so he doesn't know if it's gotten better. He states that he noticed some burning at the site and that's when he noticed it. It is not painful now and he doesn't notice the burning anymore.       Assessment & Plan:   Don [name]??is a 43 y.o.??male??with??previously untreated HIV/AIDS??(CD4 142 on admission) who initially presented with dry gangrene to the 2nd and 3rd digits of L hand and pain/weakness/discoloration over his lower extremities, subsequently found to be COVID??+ without respiratory difficulties (now off precautions), hospital course complicated by cauda equina syndrome leading to LE paralysis with urinary retention, found to have patchy hyperintensities in the spine and brain on MRI c/w transverse myelitis. Workup notable for Toxoplasma and EBV in his CSF, as well as CMV+ low level in the peripheral blood but negative in CSF. He was most recently found to have significant diffuse lymphadenopathy on PET scan concerning for lymphoma and is now s/p left axillary excisional lymph node biopsy on 9/10.   ??  Cauda Equina syndrome  MRI consistent with transverse myelitis, possibly secondary to toxoplasmosis; however, his LE paralysis and numbness have persisted despite several weeks of treatment for toxoplasmosis, increasing our suspicion for lymphoma or neuronal scar formation from toxo infection. Still waiting on surgical pathology of lymph node biopsy.  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  Anticipate a 4 to 6-week course and then transition to Bactrim after completion for PJP prophylaxis.  - f/u excisional lymph node pathology (9/10) to rule-out lymphoma  - f/u arboviral antibody panel (collected 8/22)  ????????????????????  Dry gangrene L hand    Etiology remains unclear. Dermatology suspected macro-occlusive process (embolic vs coagulopathic vs vascular). He states that he noticed color changes in his fingers following extraction of several teeth and the gangrene got progressively worse after that. TTE 8/20 did not show any vegetations indicative of endocarditis, though it is possible that there was a small vegetations too small to visualize. Bilateral UE arterial PVLs without any HD-significant findings and blood cultures obtained in the ED were negative. Rheumatology did not suspect vasculitis on their initial consult after admission. They were consulted this morning (9/16) for some abnormal rheumatologic/immunologic labs (ANA+, uptrending ESR/CRP) and again do not suspect vasculitis or an underlying rheumatologic disease. Vascular surgery consulted 8/21 and again on 9/13 and determined that it was likely not vascular in etiology. Hematology consulted 9/14 and while they acknowledge that he is a thrombotic risk due to his HIV and several concomitant infections, they do not recommend full anticoagulation due to negative hypercoagulability workup so far and no proof of thrombosis. However, they do agree that he has early atherosclerotic disease and recommended baby aspirin and agree with starting a statin.   - f/u Protein C and S activity, antithrombin III to complete thrombophilia workup per hematology  - Outpatient follow-up in hand clinic   - bandage changes q2d  ??  Discoloration bilateral lower extremities   Etiology remains unclear. Upon further close review with the infectious disease team, this does not appear to be gangrene. His DP and PT pulses are palpable and the feet are warm, which is reassuring. The plantar surface of the feet look good. CTA abdomen with  runoff showed vasculopathy in bilateral LE, with moderate-severe stenosis in several arteries, but vascular surgery does not believe this is contributing. Given that the skin lesions seem restricted to the dorsum of the foot, this may just be a severe dermatophyte infection in a highly immunocompromised host. Itraconazole started 9/14. Per pharmacology team, griseofulvin would be a better option for his tinea pedis and onychomycosis, so we will switch to that.  - STOP itroconazole 200mg  BID and START griseofulvin 1000mg  suspension daily  - Continue to monitor  ??  Tinea pedis   Trichophyton confirmed on biopsy.   - Griseofulvin as above  - Continue econazole cream to bilateral feet BID (started 12/11/2018)    Penile lesion  Exam revealed 0.73mm and 0.88mm firm raised lesions with central ulceration under the dorsal foreskin. Pt states that he thinks they were present a week ago when he noticed some burning. Denies pain, urethral dischange, hx of other penile lesions or STIs. No inguinal lymphadenopathy. HSV is at the top of the differential. Also considered are chancroid, syphilis (though negative RPR at admission likely rules this out), and molluscum.   - HSV swab collected  - Given risk factors, will also send urine GC/CT  ??  Untreated HIV infection; AIDS    Diagnosed in 2007 and was treatment naive on admission (had previously declined ART due to religious reasons).??His CD4 count was 142 and VL was 341,000 (8/20). He was noted to have oral thrush at [facility]. Started Tivacay/Descovy 8/26.??  - Plan for switch to [facility] on discharge. Dr. Dolores Frame has initiated process to coordinate outpatient follow-up and drug assistance enrollment.   - repeat HIV viral load and CD4 count today  - Will need bactrim prophylaxis for PJP (hold for now)   - Continue nystatin swishes for oral thrush    ASCVD Risk  Pooled Cohort 10-yr risk is 3%, however, HIV is an independent risk factor for CVD which is not included in the risk score. Furthermore, he already has significant atherosclerotic disease in his lower extremities and dyslipidemia. Given this, we feel it is appropriate to start him on a statin. A1c 5.5.   - continue atorvastatin 20mg  qd  - continue aspirin 81mg  qd  ??  Eosinophilia and strongyloidiasis   He has had eosinophilia (900 to 1000) as far back as 10/2010, per record review; this remains stable at this time. His??stool O&P, GIPP were??negative.??Strongyloid Ab??positive on??8/21.  -S/p course of ivermectin completed 9/13   ??  COVID-19 infection  Asymptomatic. Completed 5 days remdesivir.??  Date of positive SARS-CoV-2 PCR: 12/09/2018 at OSH  - Patient was 21 days from positive PCR on 9/9 - and precautions discontinued.  ??    Lines  - PIV in place    Objective:   Temp:  [36.8 ??C (98.2 ??F)-37.2 ??C (99 ??F)] 37.2 ??C (99 ??F)  Heart Rate:  [82-99] 82  Resp:  [14-18] 18  BP: (113-120)/(62-74) 118/71  SpO2:  [98 %-99 %] 98 %    General: No acute distress, alert, oriented, answers questions appropriately  HEENT: atraumatic, sclera anicteric   Heart: RRR, no M/R/G  Lungs: Clear to auscultation bilaterally, no crackles or wheezes, no use of accessory muscles  Abdomen: Normoactive bowel sounds, soft, non-tender, non-distended, no rebound/guarding  GU: 0.59mm and 0.30mm raised firm, skin-colored lesions with central ulceration under the dorsal foreskin. No inguinal lymphadenopathy, no perineal or perianal lesions.   Extremities: Black discoloration of the toes bilaterally with desquamation of thickened black epidermis. DP and TP pulses  palpable. Feet are warm. Left hand bandaged over amputation site.  Neuro: Alert and oriented x 3.  0/5 strength in lower extremities.  Has sensation to light touch down to just above knees, no sensation to light touch below.  Psych: Appropriate mood and affect        Labs/Studies: Labs and studies reviewed and pertinent information is addressed in the plan      Eustaquio Boyden, MS3    I attest that I have reviewed the student note and that the components of the history of the present illness, the physical exam, and the assessment and plan documented were performed by me or were performed in my presence by the student where I verified the documentation and performed (or re-performed) the exam and medical decision making.    Charise Killian, MD  PGY-1, Internal Medicine

## 2019-01-07 DIAGNOSIS — B589 Toxoplasmosis, unspecified: Secondary | ICD-10-CM

## 2019-01-07 LAB — CBC W/ AUTO DIFF
BASOPHILS ABSOLUTE COUNT: 0.1 10*9/L (ref 0.0–0.1)
BASOPHILS RELATIVE PERCENT: 1.5 %
EOSINOPHILS ABSOLUTE COUNT: 0.7 10*9/L — ABNORMAL HIGH (ref 0.0–0.4)
EOSINOPHILS RELATIVE PERCENT: 13.5 %
HEMATOCRIT: 35.2 % — ABNORMAL LOW (ref 41.0–53.0)
HEMOGLOBIN: 11.7 g/dL — ABNORMAL LOW (ref 13.5–17.5)
LARGE UNSTAINED CELLS: 3 % (ref 0–4)
LYMPHOCYTES ABSOLUTE COUNT: 1.7 10*9/L (ref 1.5–5.0)
LYMPHOCYTES RELATIVE PERCENT: 32.2 %
MEAN CORPUSCULAR HEMOGLOBIN CONC: 33.3 g/dL (ref 31.0–37.0)
MEAN CORPUSCULAR VOLUME: 83.7 fL (ref 80.0–100.0)
MEAN PLATELET VOLUME: 7.4 fL (ref 7.0–10.0)
MONOCYTES ABSOLUTE COUNT: 0.4 10*9/L (ref 0.2–0.8)
MONOCYTES RELATIVE PERCENT: 8.5 %
NEUTROPHILS ABSOLUTE COUNT: 2.1 10*9/L (ref 2.0–7.5)
NEUTROPHILS RELATIVE PERCENT: 41.1 %
PLATELET COUNT: 371 10*9/L (ref 150–440)
RED BLOOD CELL COUNT: 4.2 10*12/L — ABNORMAL LOW (ref 4.50–5.90)
RED CELL DISTRIBUTION WIDTH: 15.5 % — ABNORMAL HIGH (ref 12.0–15.0)
WBC ADJUSTED: 5.1 10*9/L (ref 4.5–11.0)

## 2019-01-07 LAB — COMPREHENSIVE METABOLIC PANEL
ALBUMIN: 3.6 g/dL (ref 3.5–5.0)
ALKALINE PHOSPHATASE: 93 U/L (ref 38–126)
ALT (SGPT): 32 U/L (ref <50)
ANION GAP: 8 mmol/L (ref 7–15)
AST (SGOT): 42 U/L (ref 19–55)
BILIRUBIN TOTAL: 0.4 mg/dL (ref 0.0–1.2)
BLOOD UREA NITROGEN: 12 mg/dL (ref 7–21)
BUN / CREAT RATIO: 18
CHLORIDE: 101 mmol/L (ref 98–107)
CO2: 26 mmol/L (ref 22.0–30.0)
CREATININE: 0.67 mg/dL — ABNORMAL LOW (ref 0.70–1.30)
EGFR CKD-EPI AA MALE: >90 mL/min/{1.73_m2} (ref >=60)
EGFR CKD-EPI NON-AA MALE: >90 mL/min/{1.73_m2} (ref >=60)
GLUCOSE RANDOM: 99 mg/dL (ref 70–179)
POTASSIUM: 4.4 mmol/L (ref 3.5–5.0)
PROTEIN TOTAL: 7.2 g/dL (ref 6.5–8.3)
SODIUM: 135 mmol/L (ref 135–145)

## 2019-01-07 LAB — HIV RNA, QUANTITATIVE, PCR
HIV RNA LOG(10): 2.07 {Log_copies}/mL — ABNORMAL HIGH (ref <0.00)
HIV RNA: 117 {copies}/mL — ABNORMAL HIGH (ref <0)

## 2019-01-07 LAB — PROTEIN S ACTIVITY: Lab: 39 — ABNORMAL LOW

## 2019-01-07 LAB — ALT (SGPT): Alanine aminotransferase:CCnc:Pt:Ser/Plas:Qn:: 32

## 2019-01-07 LAB — PROTEIN C ACTIVITY: Lab: 146

## 2019-01-07 LAB — HIV RNA: HIV 1 RNA:NCnc:Pt:Ser/Plas:Qn:Probe.amp.tar: 117 — ABNORMAL HIGH

## 2019-01-07 LAB — MEAN CORPUSCULAR HEMOGLOBIN CONC: Lab: 33.3

## 2019-01-07 LAB — MAGNESIUM: Magnesium:MCnc:Pt:Ser/Plas:Qn:: 1.8

## 2019-01-07 MED ORDER — PYRIMETHAMINE 25 MG TABLET
ORAL_TABLET | Freq: Two times a day (BID) | ORAL | 0 refills | 30.00000 days | Status: SS
Start: 2019-01-07 — End: 2019-01-07

## 2019-01-07 MED ORDER — GRISEOFULVIN ULTRAMICROSIZE 125 MG TABLET
ORAL_TABLET | Freq: Two times a day (BID) | ORAL | 0 refills | 30 days
Start: 2019-01-07 — End: 2019-01-07

## 2019-01-07 MED ORDER — LEUCOVORIN CALCIUM 25 MG TABLET
ORAL_TABLET | Freq: Every day | ORAL | 0 refills | 30 days
Start: 2019-01-07 — End: 2019-01-07

## 2019-01-07 MED ORDER — SULFASALAZINE 500 MG TABLET
ORAL_TABLET | Freq: Four times a day (QID) | ORAL | 0 refills | 30 days
Start: 2019-01-07 — End: 2019-01-07

## 2019-01-07 MED ORDER — PYRIMETHAMINE 25 MG TABLET: 75 mg | tablet | Freq: Two times a day (BID) | 0 refills | 30 days

## 2019-01-07 MED ORDER — LEUCOVORIN CALCIUM 10 MG TABLET
ORAL_TABLET | Freq: Every day | ORAL | 0 refills | 30 days
Start: 2019-01-07 — End: 2019-01-07

## 2019-01-07 NOTE — Unmapped (Signed)
Report received from Carlyle Lipa, RN at [phone]. VSS. Falls precautions maintained. Foley in place; UOP adequate. Zofran 8 mg ODT given x1 for nausea at start of shift. Turns prompted Q2H; patient able to self-turn. BLE continue to be swollen and discolored. Medium-sized, formed BM this AM. No acute concerns. Will continue to monitor.    Problem: Adult Inpatient Plan of Care  Goal: Plan of Care Review  Outcome: Progressing  Goal: Patient-Specific Goal (Individualization)  Outcome: Progressing  Goal: Absence of Hospital-Acquired Illness or Injury  Outcome: Progressing  Goal: Optimal Comfort and Wellbeing  Outcome: Progressing  Goal: Readiness for Transition of Care  Outcome: Progressing  Goal: Rounds/Family Conference  Outcome: Progressing     Problem: Infection  Goal: Infection Symptom Resolution  Outcome: Progressing     Problem: Fall Injury Risk  Goal: Absence of Fall and Fall-Related Injury  Outcome: Progressing     Problem: Self-Care Deficit  Goal: Improved Ability to Complete Activities of Daily Living  Outcome: Progressing     Problem: Wound  Goal: Optimal Wound Healing  Outcome: Progressing     Problem: Skin Injury Risk Increased  Goal: Skin Health and Integrity  Outcome: Progressing

## 2019-01-07 NOTE — Unmapped (Signed)
Infectious Disease (MDK) Progress Note    Subjective and 24-hour Events:   No acute events overnight. Pt has no complaints today.       Assessment & Plan:   Don [name]??is a 43 y.o.??male??with??previously untreated HIV/AIDS??(CD4 142 on admission) who initially presented with dry gangrene to the 2nd and 3rd digits of L hand and pain/weakness/discoloration over his lower extremities, subsequently found to be COVID??+ without respiratory difficulties (now off precautions), hospital course complicated by cauda equina syndrome leading to LE paralysis with urinary retention, found to have patchy hyperintensities in the spine and brain on MRI c/w transverse myelitis. Workup notable for Toxoplasma and EBV in his CSF, as well as CMV+ low level in the peripheral blood but negative in CSF. He was most recently found to have significant diffuse lymphadenopathy on PET scan concerning for lymphoma and is now s/p left axillary excisional lymph node biopsy on 9/10.   ??  Cauda Equina syndrome  MRI consistent with transverse myelitis, possibly secondary to toxoplasmosis; however, his LE paralysis and numbness have persisted despite a month of treatment for toxoplasmosis, which indicates that he may have neural scar formation and a guarded prognosis for neurologic recovery. Lymph node biopsy negative for malignancy and organisms and showed inflammatory changes consistent with chronic HIV. Neurology consulted again on 9/17 and do not recommend follow up imaging at this point since the clinical picture has not changed.   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  Anticipate a 4 to 6-week course and then transition to Bactrim after completion for PJP prophylaxis and toxo maintenance  - Per neurology, re-imaging can be considered if he develops new or worsening symptoms  - f/u arboviral antibody panel (collected 8/22)   ????????????????????  Dry gangrene L hand    Etiology remains unclear. Dermatology suspected macro-occlusive process (embolic vs coagulopathic vs vascular). He states that he noticed color changes in his fingers following extraction of several teeth and the gangrene got progressively worse after that. TTE 8/20 did not show any vegetations indicative of endocarditis, though it is possible that there was a small vegetations too small to visualize. Bilateral UE arterial PVLs without any HD-significant findings and blood cultures obtained in the ED were negative. Rheumatology did not suspect vasculitis on their initial consult after admission. They were consulted this morning (9/16) for some abnormal rheumatologic/immunologic labs (ANA+, uptrending ESR/CRP) and again do not suspect vasculitis or an underlying rheumatologic disease. Vascular surgery consulted 8/21 and again on 9/13 and determined that it was likely not vascular in etiology. Hematology consulted 9/14 and while they acknowledge that he is a thrombotic risk due to his HIV and several concomitant infections, they do not recommend full anticoagulation due to negative hypercoagulability workup so far and no proof of thrombosis. Protein S activity was low with normal Protein C and elevated ATIII activity. However, they do agree that he has early atherosclerotic disease and recommended baby aspirin and agree with starting a statin.   - Outpatient follow-up in hand clinic   - bandage changes q2d  ??  Discoloration bilateral lower extremities   Etiology remains unclear. Upon further close review with the infectious disease team, this does not appear to be gangrene. His DP and PT pulses are palpable and the feet are warm, which is reassuring. The plantar surface of the feet look good. 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 Given that the  skin lesions seem restricted to the dorsum of the foot, this may just be a severe dermatophyte infection in a highly immunocompromised host. Itraconazole started 9/14. Per Pharmacy team, griseofulvin is a better option for his tinea pedis and onychomycosis, so we switched to that on 9/16.  - continue griseofulvin 1000mg  suspension daily (6 month treatment course)  - Continue to monitor  ??  Tinea pedis   Trichophyton confirmed on biopsy.   - Griseofulvin as above  - Continue econazole cream to bilateral feet BID (started 12/11/2018)    Penile lesion  Exam revealed 0.41mm and 0.21mm firm raised lesions with central ulceration under the dorsal foreskin. Pt states that he thinks they were present a week ago when he noticed some burning. Denies pain, urethral dischange, hx of other penile lesions or STIs. No inguinal lymphadenopathy. HSV is at the top of the differential. Also considered are chancroid, syphilis (though negative RPR at admission likely rules this out), and molluscum.   - HSV pending  - Given risk factors, will also send urine GC/CT (pending)  ??  Untreated HIV infection; AIDS    Diagnosed in 2007 and was treatment naive on admission (had previously declined ART due to religious reasons).??His CD4 count was 142 and VL was 341,000 (8/20). He was noted to have oral thrush at [facility]. Started Tivacay/Descovy 8/26.??CD4 count 9/17 improved to 231.  - Plan for switch to [facility] on discharge. Dr. Dolores Frame has initiated process to coordinate outpatient follow-up and drug assistance enrollment.   - Will need bactrim prophylaxis for PJP (hold for now)   - Continue nystatin swishes for oral thrush    ASCVD Risk  Pooled Cohort 10-yr risk is 3%, however, HIV is an independent risk factor for CVD which is not included in the risk score. Furthermore, he already has significant atherosclerotic disease in his lower extremities and dyslipidemia. Given this, we feel it is appropriate to start him on a statin. A1c 5.5.   - continue atorvastatin 20mg  qd  - continue aspirin 81mg  qd  ??  Eosinophilia and strongyloidiasis   He has had eosinophilia (900 to 1000) as far back as 10/2010, per record review; this remains stable at this time. His??stool O&P, GIPP were??negative.??Strongyloid Ab??positive on??8/21.  -S/p course of ivermectin completed 9/13   ??  COVID-19 infection  Asymptomatic. Completed 5 days remdesivir.??  Date of positive SARS-CoV-2 PCR: 12/09/2018 at OSH  - Patient was 21 days from positive PCR on 9/9 - and precautions discontinued.  ??    Lines  - PIV in place    Objective:   Temp:  [36.5 ??C (97.7 ??F)-36.9 ??C (98.4 ??F)] 36.9 ??C (98.4 ??F)  Heart Rate:  [80-94] 80  Resp:  [14] 14  BP: (117-123)/(69-73) 123/73  SpO2:  [99 %-100 %] 99 %    General: No acute distress, alert, oriented, answers questions appropriately  HEENT: atraumatic, sclera anicteric   Heart: RRR, no M/R/G  Lungs: Clear to auscultation bilaterally, no crackles or wheezes, no use of accessory muscles  Abdomen: Normoactive bowel sounds, soft, non-tender, non-distended, no rebound/guarding  GU: defer   Extremities: Black discoloration of the toes bilaterally with desquamation of thickened black epidermis. DP and TP pulses palpable. Feet are warm. Left hand bandaged over amputation site.  Neuro: Alert and oriented x 3.  0/5 strength in lower extremities.  Has sensation to light touch down to just above knees, no sensation to light touch below.  Psych: Appropriate mood and affect  Labs/Studies: Labs and studies reviewed and pertinent information is addressed in the plan      Eustaquio Boyden, MS3    I attest that I have reviewed the student note and that the components of the history of the present illness, the physical exam, and the assessment and plan documented were performed by me or were performed in my presence by the student where I verified the documentation and performed (or re-performed) the exam and medical decision making.    Charise Killian, MD  PGY-1, Internal Medicine

## 2019-01-07 NOTE — Unmapped (Signed)
[facility] Specialty Medication Referral: Financial Assistance Approved      Medication (Brand/Generic): [id number]    Final Test Claim completed with resulted information below:    Patient ABLE to fill at [facility]  Insurance Company:  [employer]  Anticipated Copay: $0  Is anticipated copay with a copay card or grant? Yes, there was a grant approved for this patient for this medication.     Does this patient have to receive a partial fill of the medication due to insurance restrictions? NO  If so, please cofirm how many days supply is allowed per plan per fill and how long the patient will have to fill partial months supply for the medication: NOT APPLICABLE     If the copay is under the $25 defined limit, per policy there will be no further investigation of need for financial assistance at this time unless patient requests. This referral has been communicated to the provider and handed off to the [facility] team for further processing and filling of prescribed medication.   ______________________________________________________________________  Please utilize this referral for viewing purposes as it will serve as the central location for all relevant documentation and updates.

## 2019-01-08 LAB — COMPREHENSIVE METABOLIC PANEL
ALBUMIN: 3.8 g/dL (ref 3.5–5.0)
ALKALINE PHOSPHATASE: 108 U/L (ref 38–126)
ALT (SGPT): 40 U/L (ref <50)
ANION GAP: 10 mmol/L (ref 7–15)
AST (SGOT): 43 U/L (ref 19–55)
BILIRUBIN TOTAL: 0.5 mg/dL (ref 0.0–1.2)
BUN / CREAT RATIO: 13
CALCIUM: 9.1 mg/dL (ref 8.5–10.2)
CHLORIDE: 100 mmol/L (ref 98–107)
CO2: 25 mmol/L (ref 22.0–30.0)
CREATININE: 0.83 mg/dL (ref 0.70–1.30)
EGFR CKD-EPI AA MALE: >90 mL/min/{1.73_m2} (ref >=60)
EGFR CKD-EPI NON-AA MALE: >90 mL/min/{1.73_m2} (ref >=60)
GLUCOSE RANDOM: 152 mg/dL (ref 70–179)
POTASSIUM: 4.5 mmol/L (ref 3.5–5.0)

## 2019-01-08 LAB — CBC W/ AUTO DIFF
BASOPHILS RELATIVE PERCENT: 2 %
HEMATOCRIT: 36.7 % — ABNORMAL LOW (ref 41.0–53.0)
HEMOGLOBIN: 12 g/dL — ABNORMAL LOW (ref 13.5–17.5)
LARGE UNSTAINED CELLS: 5 % — ABNORMAL HIGH (ref 0–4)
LYMPHOCYTES ABSOLUTE COUNT: 1.4 10*9/L — ABNORMAL LOW (ref 1.5–5.0)
LYMPHOCYTES RELATIVE PERCENT: 31.1 %
MEAN CORPUSCULAR HEMOGLOBIN CONC: 32.7 g/dL (ref 31.0–37.0)
MEAN CORPUSCULAR HEMOGLOBIN: 27.4 pg (ref 26.0–34.0)
MEAN CORPUSCULAR VOLUME: 84 fL (ref 80.0–100.0)
MEAN PLATELET VOLUME: 7.9 fL (ref 7.0–10.0)
MONOCYTES ABSOLUTE COUNT: 0.4 10*9/L (ref 0.2–0.8)
MONOCYTES RELATIVE PERCENT: 8.1 %
NEUTROPHILS ABSOLUTE COUNT: 1.7 10*9/L — ABNORMAL LOW (ref 2.0–7.5)
NEUTROPHILS RELATIVE PERCENT: 38.8 %
PLATELET COUNT: 363 10*9/L (ref 150–440)
RED BLOOD CELL COUNT: 4.37 10*12/L — ABNORMAL LOW (ref 4.50–5.90)
RED CELL DISTRIBUTION WIDTH: 15.7 % — ABNORMAL HIGH (ref 12.0–15.0)
WBC ADJUSTED: 4.3 10*9/L — ABNORMAL LOW (ref 4.5–11.0)

## 2019-01-08 LAB — SODIUM: Sodium:SCnc:Pt:Ser/Plas:Qn:: 135

## 2019-01-08 LAB — HEMOGLOBIN: Hemoglobin:MCnc:Pt:Bld:Qn:: 12 — ABNORMAL LOW

## 2019-01-08 LAB — MAGNESIUM: Magnesium:MCnc:Pt:Ser/Plas:Qn:: 1.8

## 2019-01-08 NOTE — Unmapped (Signed)
Pt resting in bed. No distress. Medicated as needed for pain in finger wound. Will cont to monitor. No bm so far that this nurse is aware of.  Problem: Adult Inpatient Plan of Care  Goal: Plan of Care Review  Outcome: Progressing  Goal: Patient-Specific Goal (Individualization)  Outcome: Progressing  Goal: Absence of Hospital-Acquired Illness or Injury  Outcome: Progressing  Intervention: Identify and Manage Fall Risk  Flowsheets (Taken 01/07/2019 1714)  Safety Interventions:   low bed   nonskid shoes/slippers when out of bed  Intervention: Prevent Skin Injury  Flowsheets (Taken 01/07/2019 1714)  Pressure Reduction Techniques: frequent weight shift encouraged  Intervention: Prevent VTE (venous thromboembolism)  Flowsheets (Taken 01/07/2019 1714)  VTE Prevention/Management:   anticoagulation therapy   ambulation promoted  Intervention: Prevent Infection  Flowsheets (Taken 01/07/2019 1714)  Infection Prevention: handwashing promoted  Goal: Optimal Comfort and Wellbeing  Outcome: Progressing  Intervention: Monitor Pain and Promote Comfort  Flowsheets (Taken 01/07/2019 1714)  Pain Management Interventions: pain management plan reviewed with patient/caregiver  Intervention: Provide Person-Centered Care  Flowsheets (Taken 01/07/2019 1714)  Trust Relationship/Rapport:   choices provided   questions encouraged   care explained  Goal: Readiness for Transition of Care  Outcome: Progressing  Goal: Rounds/Family Conference  Outcome: Progressing     Problem: Infection  Goal: Infection Symptom Resolution  Outcome: Progressing     Problem: Fall Injury Risk  Goal: Absence of Fall and Fall-Related Injury  Outcome: Progressing  Intervention: Identify and Manage Contributors to Fall Injury Risk  Flowsheets (Taken 01/07/2019 1714)  Medication Review/Management: medications reviewed  Self-Care Promotion:   BADL personal objects within reach   independence encouraged  Intervention: Promote Injury-Free Environment  Flowsheets (Taken 01/07/2019 1714)  Safety Interventions:   low bed   nonskid shoes/slippers when out of bed  Environmental Safety Modification: clutter free environment maintained     Problem: Self-Care Deficit  Goal: Improved Ability to Complete Activities of Daily Living  Outcome: Progressing  Intervention: Promote Activity and Functional Independence  Flowsheets (Taken 01/07/2019 1714)  Activity Assistance Provided: assistance, 1 person  Self-Care Promotion:   BADL personal objects within reach   independence encouraged     Problem: Wound  Goal: Optimal Wound Healing  Outcome: Progressing  Intervention: Promote Effective Wound Healing  Flowsheets (Taken 01/07/2019 1714)  Oral Nutrition Promotion: calorie dense foods provided  Pain Management Interventions: pain management plan reviewed with patient/caregiver     Problem: Skin Injury Risk Increased  Goal: Skin Health and Integrity  Outcome: Progressing  Intervention: Optimize Skin Protection  Flowsheets (Taken 01/07/2019 1714)  Pressure Reduction Techniques: frequent weight shift encouraged  Head of Bed (HOB): HOB elevated  Pressure Reduction Devices: pressure-redistributing mattress utilized  Skin Protection: incontinence pads utilized  Intervention: Promote and Optimize Oral Intake  Flowsheets (Taken 01/07/2019 1714)  Oral Nutrition Promotion: calorie dense foods provided

## 2019-01-08 NOTE — Unmapped (Signed)
[facility]   Patient Onboarding/Medication Counseling    Don [name] is a 43 y.o. male with HIV who I am counseling today on initiation of therapy.  I am speaking to the patient.(via Spanish Interpreter: [employer] [phone])    Verified patient's date of birth / HIPAA.    Specialty medication(s) to be sent: Infectious Disease: Biktarvy      Non-specialty medications/supplies to be sent: n/a      Medications not needed at this time: n/a         Biktarvy (bictegravir, emtricitabine, and tenofovir alafenamide) tablets    Medication & Administration     Dosage: Take 1 tablet by mouth daily    Administration: Take without regard to food    Adherence/Missed dose instructions: take missed dose as soon as you remember. If it is close to the time of your next dose, skip the dose and resume with your next scheduled dose.    Goals of Therapy     To suppress viral replication and keep patient's HIV undetectable by lab tests    Side Effects & Monitoring Parameters     Common Side Effects:  ? Diarrhea  ? Upset stomach  ? Headache  ? Changes in Weight  ? Changes in mood    The following side effects should be reported to the provider:     ?? If patient experiences: signs of an allergic reaction (rash; hives; itching; red, swollen, blistered, or peeling skin with or without fever; wheezing; tightness in the chest or throat; trouble breathing, swallowing, or talking; unusual hoarseness; or swelling of the mouth, face, lips, tongue, or throat)  ?? signs of kidney problems (unable to pass urine, change in how much urine is passed, blood in the urine, or a big weight gain)  ?? signs of liver problems (dark urine, feeling tired, not hungry, upset stomach or stomach pain, light-colored stools, throwing up, or yellow skin or eyes)  ?? signs of lactic acidosis (fast breathing, fast heartbeat, a heartbeat that does not feel normal, very bad upset stomach or throwing up, feeling very sleepy, shortness of breath, feeling very tired or weak, very bad dizziness, feeling cold, or muscle pain or cramps)  Weight gain: some patients have reported weight gain after starting this medication. The amount of weight can vary.    Monitoring Parameters:  ?? CD4  Count  ?? HIV RNA plasma levels  ?? Liver function  ?? Total bilirubin  ?? serum creatinine  ?? urine glucose  ?? urine protein (prior to or when initiating therapy and as clinically indicated during therapy);       Drug/Food Interactions     ? Medication list reviewed in Epic. The patient was instructed to inform the care team before taking any new medications or supplements. No drug interactions identified.   ? Calcium Salts: May decrease the serum concentration of Biktarvy. If taken with food, Biktarvy can be administered with calcium salts. If taken on an empty stomach, separate doses by at least 2 hours.   ? Iron Preparations: May decrease the serum concentration of Biktarvy. If taken with food, Biktarvy can be administered with Ferrous sulfate. If taken on an empty stomach, separate doses by at least 2 hours. Avoid other iron salts.    Contraindications, Warnings, & Precautions     ? Black Box Warning: Severe acute exacertbations of HBV have been reported in patients coinfected with HIV-1 and HBV fllowing discontinuation of therapy  ? Coadministration with dofetilide, rifampin  is contraindicated  ? Immune reconstitution syndrome: Patients may develop immune reconstitution syndrome, resulting in the occurrence of an inflammatory response to an indolent or residual opportunistic infection or activation of autoimmune disorders (eg, Graves disease, polymyositis, Guillain-Barr?? syndrome, autoimmune hepatitis)   ? Lactic acidosis/hepatomegaly  ? Renal toxicity: patients with preexisting renal impairment and those taking nephrotoxic agents (including NSAIDs) are at increased risk.     Storage, Handling Precautions, & Disposal     ? Store in the original container at room temperature.   ? Keep lid tightly closed.   ? Store in a dry place. Do not store in a bathroom.   ? Keep all drugs in a safe place. Keep all drugs out of the reach of children and pets.   ? Throw away unused or expired drugs. Do not flush down a toilet or pour down a drain unless you are told to do so. Check with your pharmacist if you have questions about the best way to throw out drugs. There may be drug take-back programs in your area.        Current Medications (including OTC/herbals), Comorbidities and Allergies     No current facility-administered medications for this visit.      Current Outpatient Medications   Medication Sig Dispense Refill   ??? bictegrav-emtricit-tenofov ala (BIKTARVY) 50-200-25 mg tablet Take 1 tablet by mouth daily. 30 tablet 6   ??? pyrimethamine (DARAPRIM) 25 mg tablet Take 3 tablets (75 mg total) by mouth daily. 90 tablet 0     Facility-Administered Medications Ordered in Other Visits   Medication Dose Route Frequency Provider Last Rate Last Dose   ??? acetaminophen (TYLENOL) tablet 650 mg  650 mg Oral Q4H PRN Larina Earthly, MD   650 mg at 01/07/19 2141   ??? alteplase (ACTIVASE) injection small catheter clearance 2 mg  2 mg Intravenous Once PRN Larina Earthly, MD       ??? alteplase (ACTIVASE) injection small catheter clearance 2 mg  2 mg Intravenous Once PRN Sharen Counter, MD       ??? aluminum-magnesium hydroxide-simethicone (MAALOX MAX) 80-80-8 mg/mL oral suspension  30 mL Oral Q4H PRN Larina Earthly, MD       ??? aspirin chewable tablet 81 mg  81 mg Oral Daily Sharen Counter, MD   81 mg at 01/08/19 0827   ??? atorvastatin (LIPITOR) tablet 20 mg  20 mg Oral Daily Alison Murray Hoppens, MD   20 mg at 01/08/19 0827   ??? bisacodyL (DULCOLAX) suppository 10 mg  10 mg Rectal Daily PRN Larina Earthly, MD   10 mg at 12/13/18 1408   ??? calcium carbonate (TUMS) chewable tablet 400 mg elem calcium  400 mg elem calcium Oral Daily PRN Larina Earthly, MD       ??? dextrose 50 % in water (D50W) 50 % solution 12.5 g  12.5 g Intravenous Q10 Min PRN Larina Earthly, MD   12.5 g at 12/12/18 1725   ??? dolutegravir (TIVICAY) TABLET Tab 50 mg  50 mg Oral daily Larina Earthly, MD   50 mg at 01/08/19 0827   ??? econazole nitrate (SPECTAZOLE) 1 % cream   Topical BID Larina Earthly, MD       ??? emtricitabine-tenofovir alafen (Descovy) 200-25 mg tablet 1 tablet  1 tablet Oral Daily Larina Earthly, MD   1 tablet at 01/08/19 [phone]   ??? enoxaparin (LOVENOX) syringe 30 mg  30 mg Subcutaneous Q12H [facility]  Larina Earthly, MD   30 mg at 01/08/19 9604   ??? gabapentin (NEURONTIN) capsule 100 mg  100 mg Oral TID Larina Earthly, MD   100 mg at 01/08/19 0827   ??? glucagon injection 1 mg  1 mg Subcutaneous Once PRN Larina Earthly, MD       ??? griseofulvin (GRIS-PEG) tablet 375 mg  375 mg Oral BID Sharen Counter, MD   375 mg at 01/08/19 5409   ??? guaiFENesin (ROBITUSSIN) oral syrup  200 mg Oral Q4H PRN Larina Earthly, MD       ??? leucovorin (WELLCOVORIN) tablet 10 mg  10 mg Oral Daily Sharen Counter, MD   10 mg at 01/08/19 8119   ??? LORazepam (ATIVAN) injection 1 mg  1 mg Intravenous Once PRN Larina Earthly, MD       ??? LORazepam (ATIVAN) injection 1 mg  1 mg Intravenous Once PRN Larina Earthly, MD       ??? melatonin tablet 3 mg  3 mg Oral Nightly PRN Larina Earthly, MD       ??? naloxone (NARCAN) injection 0.1 mg  0.1 mg Intravenous Q5 Min PRN Larina Earthly, MD       ??? nystatin (MYCOSTATIN) oral suspension  500,000 Units Oral 4x Daily Larina Earthly, MD   500,000 Units at 01/08/19 0540   ??? ondansetron (ZOFRAN-ODT) disintegrating tablet 8 mg  8 mg Oral Q8H PRN Larina Earthly, MD   8 mg at 01/07/19 1516    Or   ??? ondansetron (ZOFRAN) injection 4 mg  4 mg Intravenous Q8H PRN Larina Earthly, MD   4 mg at 01/02/19 0546   ??? oxyCODONE (ROXICODONE) immediate release tablet 5 mg  5 mg Oral Q4H PRN Larina Earthly, MD   5 mg at 01/08/19 0947   ??? polyethylene glycol (MIRALAX) packet 17 g  17 g Oral BID Larina Earthly, MD   17 g at 01/08/19 1478   ??? pyrimethamine (DARAPRIM) oral suspension  75 mg Oral Daily Larina Earthly, MD   75 mg at 01/08/19 0946   ??? senna (SENOKOT) tablet 2 tablet  2 tablet Oral Nightly Larina Earthly, MD   2 tablet at 01/05/19 2030   ??? sulfaDIAZINE tablet 1,500 mg  1,500 mg Oral Q6H Larina Earthly, MD   1,500 mg at 01/08/19 0540       No Known Allergies    Patient Active Problem List   Diagnosis   ??? Thrush   ??? Gangrene (CMS-HCC)   ??? COVID-19 virus infection   ??? History of dental problems   ??? Lower extremity edema   ??? HIV (human immunodeficiency virus infection) (CMS-HCC)   ??? Bilateral leg weakness   ??? Urinary retention with bladder stretch injury       Reviewed and up to date in Epic.    Appropriateness of Therapy     Is medication and dose appropriate based on diagnosis? Yes    Baseline Quality of Life Assessment      How many days over the past month did your HIV keep you from your normal activities? prior to hospitalization, 0 days    Financial Information     Medication Assistance provided: Copay Assistance    Anticipated copay of $0.00 reviewed with patient. Verified delivery address.    Delivery Information     Scheduled delivery date: 01/12/2019    Expected start date: at time of  discharge    Medication will be delivered via hospital courier: [facility] to the temporary address in [place].  This shipment will not require a signature.      Explained the services we provide at [facility] and that each month we would call to set up refills.  Stressed importance of returning phone calls so that we could ensure they receive their medications in time each month.  Informed patient that we should be setting up refills 7-10 days prior to when they will run out of medication.  A pharmacist will reach out to perform a clinical assessment periodically.  Informed patient that a welcome packet and a drug information handout will be sent.      Patient verbalized understanding of the above information as well as how to contact the pharmacy at [phone] option 4 with any questions/concerns.  The pharmacy is open Monday through Friday 8:30am-4:30pm.  A pharmacist is available 24/7 via pager to answer any clinical questions they may have.    Patient Specific Needs     ? Does the patient have any physical, cognitive, or cultural barriers? No    ? Patient prefers to have medications discussed with  Patient     ? Is the patient able to read and understand education materials at a high school level or above? Yes    ? Patient's primary language is  Spanish     ? Is the patient high risk? No     ? Does the patient require a Care Management Plan? No     ? Does the patient require physician intervention or other additional services (i.e. nutrition, smoking cessation, social work)? No      Roderic Palau  [facility] Specialty Pharmacist

## 2019-01-08 NOTE — Unmapped (Signed)
VSS.  No acute events.  Patient nauseous overnight, but refused intervention.  Wanted to skip midnight scheduled medication.    Problem: Adult Inpatient Plan of Care  Goal: Absence of Hospital-Acquired Illness or Injury  Intervention: Prevent Skin Injury  Flowsheets (Taken 01/08/2019 0615)  Pressure Reduction Techniques: frequent weight shift encouraged     Problem: Self-Care Deficit  Goal: Improved Ability to Complete Activities of Daily Living  Intervention: Promote Activity and Functional Independence  Flowsheets (Taken 01/08/2019 0615)  Activity Assistance Provided: independent  Self-Care Promotion: independence encouraged

## 2019-01-08 NOTE — Unmapped (Signed)
Physical Medicine and Rehab  Follow Up Consult Note  ??  Requesting Attending Physician: Ebony Hail, MD  Service Requesting Consult: Infectious Disease ([facility])  ??  ASSESSMENT / RECOMMENDATIONS:   ??  Don [name] is a 43 y.o. male with past medical history of untreated HIV infection?? who is currently hospitalized for dry gangrene of left hand, bilat feet discoloration/pain/swelling, COVID-19 infection, and untreated HIV c/b LE paralysis.  The patient is seen in consultation for evaluation of rehabilitation needs.  ??  Functional Impairment  Paraplegia, Neurogenic Bowel and Bladder  HIV / AIDS  Left hand gangrene with finger amputation  ??  Nontraumatic Spinal Cord Injury:  -Patient presents with spinal cord injury at lower thoracic level. ??American Spinal Injury Association Impairment Scale??testing can be done during rehabilitation to assist with prognostication, but function may change.  ??  -Expected neurogenic bladder dysfunction/likely LMN:??Continue Foley catheter for now. ??Once daily urine output is less than 2.4 L, can transition to an intermittent catheterization program. ??Goal I&O cath volumes should be 350-500 cc. ??  ??  -Expected neurogenic bowel dysfunction:??The patient will likely have lower motor neuron bowel. ??Currently on Miralax 17G daily and PRN bisacodyl supp. ??Monitor bowel movement pattern and sensation for now. ??Likely will require addition of further stool softener/laxatives and may also need digital stimulation or manual evacuation of stool.  Pt deferred BC reflex test today, which would indicate UMN v. LMN bowel and help [place] make better BM regimen recommendations.  ??  -Skin protection:????Perform frequent skin checks over bony prominences in contact with bed, check skin under braces frequently. ??Reposition every 2 hours (30 degree angle from bed/ flank on left side, 30 degree angle from bed on right side).????Order bilateral??Multipodus boots to??both feet for at least 6 hours daily. Rehabilitation & Disposition  - Please continue to have patient work with PT and OT to maximize functional status with mobility and ADLs.  - Patient has complex rehab, nursing, and medical needs and may become appropriate for Acute Inpatient Rehabilitation with regular therapy participation / demonstrated progress, completion of medical workup / formulation of treatment plan and confirmation of necessary assistance at home.  In AIR the patient would be expected to tolerate minimum of 3 hrs of therapy daily, 5x/week.   - Functional goal would be for him to become modified independent at a manual wheelchair level in the home. Likely not requiring 24/7 assist, but will need some help with ADLs and IADLs. I advised patient that he will need a ramp.     In order for the patient to be considered for admission to [facility] inpatient rehab, the case manager must give the patient / family choice in post-acute discharge options AND if the patient desires [facility], place a referral order to [facility] inpatient rehab. The case manager may contact the Rehab Intake Coordinator with questions about acceptance to [facility] AIR,  bed availability and insurance authorization.     Darrick Grinder, MD    I reviewed the previous PM&R consult note by J. Catanzaro NP and agree with the initial assessment and plan.  I performed a functional evaluation and reviewed updated therapy notes.??  ??  SUBJECTIVE:   ??  Reason for Consult: Patient seen in consultation at the request of Ebony Hail, MD for evaluation of rehabilitation needs and recommendations.  ??  History of Present Illness: Don [name] is a 44 y.o. male seen in consultation regarding rehabilitation needs.    ??  Pt presents to [facility] with dusky  and painful left 2nd and 3rd digits, with autoamputation of tip of 3rd finger occurring several months ago. He reports 3 days prior he had 3 teeth extracted.  More concerning to pt was 3 days prior to presentation at hospital his toes became discolored and ankles swelled.  He endorses painful feet for a yr or so.  He 1st presented to [facility] 8/19, where he tested pos for COVID-19, but denied symptoms.  Of note, pt was diagnosed with HIV in 2007, offered HART in 2013, but denied d/t religious beliefs.  Since then, he has had 2 dtrs and would like to pursue treatment.  Etiology of left hand dry gangrene remains unclear.  BUE arterial PVLs unremarkable.  Blood cx neg.  Echo wo signs of endocarditis.  Rheum did not suspect vasculitis, though ANA+, and now uptrending ESR and CRP.  VS consulted twice and did not feel vascular etiology.  He has had neg hypercoagulability w/u.  Skin lesions on feet are also of unclear etiology, but may be d/t sever dermatophyte infection and will treat with oral antifungals.  Perhaps more importantly, course has been c/b LE paralysis and numbness, which have persisted in spite of several weeks of treatment of toxoplasmosis, therefore increased suspicion for CNS lymphoma.  PET scan demonstrated significant diffuse lymphadenopathy, and he is now s/p left axillary excisional LN bx 9/10.   MRI c/w transverse myelitis.    ??  Today, pt seen lying in bed.    Pt reports tingling sensation in bilat legs, but no pain in feet like he was feeling PTA.  He reports weakness in legs occurred over a few days after being admitted to hospital.  He denies pain.   He endorses the urge to have BM, but lacks control and has no sense if he has had BM.  He endorses saddle anesthesia.  He is alert, interactive, and interested in rehab when medically ready, but understands he will need more assistance at home.  ??  Prior Functional Status:  ??  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 Pt began walking with a cane immediately prior to admission, but was ambulatory upon admission. Pt was independent with ADLs and IADLs, and drives. Pt wears glasses for driving at baseline. Endorses occasional falls at home when quickly running to the shower and falling into the tub.  ??  Current Functional Status:    Activities of Daily Living:   Assessment  Problem List: Decreased strength, Decreased range of motion, Decreased endurance, Impaired balance, Decreased mobility, Decreased coordination, Impaired vision, Visual perception deficits, Impaired sensation, Decreased skin integrity, Orthopedic restrictions, Fall Risk, Impaired ADLs, Imparied fine motor skills  Assessment: Don [name] is making good progress. He has been transferred out of the COVID unit.  Today's Interventions: Received sup in bed and agreeable to OT treatment session. ModA sup<>sit, fluctuating assist EOB between close SBAS-ModA, Completed various core strengthening exercises EOB: trunk rotation, leaning, elbow prop, and reaching. Patient had the most difficulty c anterior reaching. He requires MaxA to scoot up HOB. ModA sit<>sup. Pt was left sup in bed c call bell in reach and all needs met  ??  Mobility:   Bed Mobility: Rolling supine to right and left with mod-A; requiring manual facilitation to position and maintain LEs in hooklying position. Supine with HOB elevated to longsitting position with CGA (pulling with RUE on bedrail); Longsitting to sitting EOB with moderate assist (x2 person  for safety) (Total assist to advance BLEs towards EOB); sit to supine with mod-A (x2 person)  Transfers: Unable to progress to OOB transfers d/t poor sitting balance  Balance: Static sitting balance with RUE propped on bed (when positioned in midline): CGA (brief moments of SBA); static sitting balance without UE support with min-A; impaired dynamic sitting balance (posterior and lateral LOBs with attempted trunk rotation)  Gait: 0  ??  PT Post Acute Discharge Recommendations: 5x weekly, High intensity  ??  Cognition, Swallow, Speech: Cognition / Swallow / Speech  Patient's Vision Adequate to Safely Complete Daily Activities: Yes Patient's Judgement Adequate to Safely Complete Daily Activities: Yes  Patient's Memory Adequate to Safely Complete Daily Activities: Yes  Patient Able to Express Needs/Desires: Yes  Patient has speech problem: No  ??  Assistive Devices: Cane(describes as a walking stick)  ??  Precautions:  Safety Interventions  Safety Interventions: low bed  Aspiration Precautions: awake/alert before oral intake  Bleeding Precautions: blood pressure closely monitored  Infection Management: aseptic technique maintained  Isolation Precautions: airborne precautions maintained, droplet precautions maintained  ??  Medical / Surgical History:   Past Medical History        Past Medical History:   Diagnosis Date   ??? HIV (human immunodeficiency virus infection) (CMS-HCC) ??        Past Surgical History         Past Surgical History:   Procedure Laterality Date   ??? PR BX/REMV,LYMPH NODE,DEEP AXILL Left 12/31/2018   ?? Procedure: BX/EXC LYMPH NODE; OPEN, DEEP AXILRY NODE;  Surgeon: [name], MD;  Location: MAIN OR [facility];  Service: Surgical Oncology         ??  Social History:   Social History   ??       Tobacco Use   ??? Smoking status: Never Smoker   ??? Smokeless tobacco: Never Used   Substance Use Topics   ??? Alcohol use: Not Currently   ??? Drug use: Not Currently   ??  ??  Living Environment: House  Lives With: Spouse, Daughter(3 daughters)  Home Living: One level home, Stairs to enter with rails, Tub/shower unit, Standard height toilet  Rail placement (outside): Bilateral rails  Number of Stairs: 6  ??  ??  Family History: Reviewed  family history includes Alcohol abuse in his brother; Diabetes in his sister; Kidney disease in his sister; Mental illness in an other family member.  ??  Allergies:   Patient has no known allergies.  ??  Medications:   Scheduled   ??? aspirin chewable tablet 81 mg Daily   ??? atorvastatin (LIPITOR) tablet 20 mg Daily   ??? dolutegravir (TIVICAY) TABLET Tab 50 mg daily   ??? econazole nitrate (SPECTAZOLE) 1 % cream BID ??? emtricitabine-tenofovir alafen (Descovy) 200-25 mg tablet 1 tablet Daily   ??? enoxaparin (LOVENOX) syringe 30 mg Q12H [facility]   ??? gabapentin (NEURONTIN) capsule 100 mg TID   ??? griseofulvin (GRIS-PEG) tablet 375 mg BID   ??? leucovorin (WELLCOVORIN) tablet 10 mg Daily   ??? nystatin (MYCOSTATIN) oral suspension 4x Daily   ??? polyethylene glycol (MIRALAX) packet 17 g BID   ??? pyrimethamine (DARAPRIM) oral suspension Daily   ??? senna (SENOKOT) tablet 2 tablet Nightly   ??? sulfaDIAZINE tablet 1,500 mg Q6H   ??? valACYclovir (VALTREX) tablet 1,000 mg BID     PRN acetaminophen, 650 mg, Q4H PRN  alteplase, 2 mg, Once PRN  alteplase, 2 mg, Once PRN  aluminum-magnesium hydroxide-simethicone, 30  mL, Q4H PRN  bisacodyL, 10 mg, Daily PRN  calcium carbonate, 400 mg elem calcium, Daily PRN  dextrose 50 % in water (D50W), 12.5 g, Q10 Min PRN  glucagon, 1 mg, Once PRN  guaiFENesin, 200 mg, Q4H PRN  LORazepam, 1 mg, Once PRN  LORazepam, 1 mg, Once PRN  melatonin, 3 mg, Nightly PRN  naloxone, 0.1 mg, Q5 Min PRN  ondansetron, 8 mg, Q8H PRN    Or  ondansetron, 4 mg, Q8H PRN  oxyCODONE, 5 mg, Q4H PRN      Continuous Infusions    ??  Review of Systems:    Full 10 systems reviewed and neg, unless noted in HPI  ??  OBJECTIVE:   ??  Vitals:  Vitals:    01/08/19 1332   BP: 133/68   Pulse: 92   Resp: 17   Temp: 36.7 ??C (98.1 ??F)   SpO2: 98%     ??  Physical Exam:    GEN: Lying in bed in NAD.  HEENT: Atraumatic. Normocephalic. Moist mucous membranes. Trachea midline.  RESP: NWOB on RA.  CV: Extremities warm and well perfused.  GI: Soft.  Non tender.  Non distended.  GU: Foley in place with clear-yellow urine  SKIN: Scattered abrasions on exposed skin.  MSK: 3 rd left finger amputated  NEURO:   Mental Status: Alert.  Regards examiner.  Fully oriented.  Attention intact.  Speech fluid and coherent.  Follows commands without difficulty.   Cranial Nerve: Cranial nerves II - XII grossly intact.  Sensory: Sensation diminished to light touch the entirety of RLE, below L2 level on LLE.  Sensation intact elsewhere.  Motor:     RUE/LUE: shoulder abd (C4) 5/5, biceps (C5) 5/5, wrist extension (C6) 5/5, triceps (C7) 5/5, hand grasp (T1)  5/2+    RLE/LLE: no movement BLE  Tone: Within normal limits.  No spasticity noted.  Reflexes: no clonus  PSYCH: Mood euthymic. Congruent affect.  Thought process logical.  ??  Labs and Diagnostic Studies: Reviewed   Lab Results   Component Value Date    WBC 4.3 (L) 01/08/2019    HGB 12.0 (L) 01/08/2019    HCT 36.7 (L) 01/08/2019    PLT 363 01/08/2019       Lab Results   Component Value Date    NA 135 01/08/2019    K 4.5 01/08/2019    CL 100 01/08/2019    CO2 25.0 01/08/2019    BUN 11 01/08/2019    CREATININE 0.83 01/08/2019    GLU 152 01/08/2019    CALCIUM 9.1 01/08/2019    MG 1.8 01/08/2019    PHOS 3.5 11/05/2010       Lab Results   Component Value Date    BILITOT 0.5 01/08/2019    BILIDIR 0.10 12/12/2018    PROT 7.7 01/08/2019    ALBUMIN 3.8 01/08/2019    ALT 40 01/08/2019    AST 43 01/08/2019    ALKPHOS 108 01/08/2019    GGT 54 11/05/2010       Lab Results   Component Value Date    PT 11.8 12/26/2018    INR 1.03 12/26/2018    APTT 31.4 12/26/2018     ??  Radiology Results: Reviewed   MRI spine 8/28  IMPRESSION:  Compared to 12/11/2018, there has been little interval change, with persistent increased T2 signal in the spinal cord from the level of T10 to the conus medullaris, predominantly involving the central portions of the cord, as  well as focal T2 hyperintensity within the cord at T10 and cord enhancement at T12.   ??  Multilevel degenerative changes are similar to prior as described above. Disc bulge and facet arthrosis at L5-S1 results in severe left and moderate right neural foraminal narrowing.    MRI Brain 8/28  Stable single enhancing lesion within the right corona radiata remains a nonspecific finding. No new lesions are identified.    Paralegic, has catheter. Lives with wife and 3 kids. Needs ramp for 5-6 stairs

## 2019-01-08 NOTE — Unmapped (Signed)
Infectious Disease ([facility]) Progress Note    Subjective and 24-hour Events:   No acute events overnight. Pt has no complaints today.       Assessment & Plan:   Don [name]??is a 43 y.o.??male??with??previously untreated HIV/AIDS??(CD4 142 on admission) who initially presented with dry gangrene to the 2nd and 3rd digits of L hand and pain/weakness/discoloration over his lower extremities, subsequently found to be COVID??+ without respiratory difficulties (now off precautions), hospital course complicated by cauda equina syndrome leading to LE paralysis with urinary retention, found to have patchy hyperintensities in the spine and brain on MRI c/w transverse myelitis. Workup notable for Toxoplasma and EBV in his CSF, as well as CMV+ low level in the peripheral blood but negative in CSF. He was most recently found to have significant diffuse lymphadenopathy on PET scan concerning for lymphoma and is now s/p left axillary excisional lymph node biopsy on 9/10.   ??  Cauda Equina syndrome  MRI consistent with transverse myelitis, possibly secondary to toxoplasmosis; however, his LE paralysis and numbness have persisted despite a month of treatment for toxoplasmosis, which indicates that he may have neural scar formation and a guarded prognosis for neurologic recovery. Lymph node biopsy negative for malignancy and organisms and showed inflammatory changes consistent with chronic HIV. Neurology consulted again on 9/17 and do not recommend follow up imaging at this point since the clinical picture has not changed.   -Ordered brain MRI to assess stability of the enhancing lesion in the brain.  If it does appear to be responding to antimicrobials, is likely toxoplasmosis.  If remains stable or enlarging despite treatment, will consider a brain biopsy to rule out CNS lymphoma.  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  Anticipate a 4 to 6-week course and then transition to Bactrim after completion for PJP prophylaxis and toxo maintenance  - f/u arboviral antibody panel (collected 8/22)   ????????????????????  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  Patient will be started on valganciclovir 1000 mg twice daily for 10 days.  Will reassess after that to see if the ulceration resolves???may extend if need be..  -Urine chlamydia and gonorrhea still pending.    Dry gangrene L hand    Etiology remains unclear. Dermatology suspected macro-occlusive process (embolic vs coagulopathic vs vascular). He states that he noticed color changes in his fingers following extraction of several teeth and the gangrene got progressively worse after that. TTE 8/20 did not show any vegetations indicative of endocarditis, though it is possible that there was a small vegetations too small to visualize. Bilateral UE arterial PVLs without any HD-significant findings and blood cultures obtained in the ED were negative. Rheumatology did not suspect vasculitis on their initial consult after admission. They were consulted this morning (9/16) for some abnormal rheumatologic/immunologic labs (ANA+, uptrending ESR/CRP) and again do not suspect vasculitis or an underlying rheumatologic disease. Vascular surgery consulted 8/21 and again on 9/13 and determined that it was likely not vascular in etiology. Hematology consulted 9/14 and while they acknowledge that he is a thrombotic risk due to his HIV and several concomitant infections, they do not recommend full anticoagulation due to negative hypercoagulability workup so far and no proof of thrombosis. Protein S activity was low with normal Protein C and elevated ATIII activity. However, they do agree that he has early atherosclerotic disease and recommended baby aspirin and agree with starting a statin.   - Outpatient follow-up in hand clinic   -  bandage changes daily  -We will follow-up with heme for their thoughts.  ??  Discoloration bilateral lower extremities Etiology remains unclear. Upon further close review with the infectious disease team, this does not appear to be gangrene. His DP and PT pulses are palpable and the feet are warm, which is reassuring. The plantar surface of the feet look good. 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 Given that the skin lesions seem restricted to the dorsum of the foot, this may just be a severe dermatophyte infection in a highly immunocompromised host. Itraconazole started 9/14. Per Pharmacy team, griseofulvin is a better option for his tinea pedis and onychomycosis, so we switched to that on 9/16.  - continue griseofulvin 1000mg  suspension daily (6 month treatment course)  - Continue to monitor  ??  Tinea pedis   Trichophyton confirmed on biopsy.   - Griseofulvin as above  - Continue econazole cream to bilateral feet BID (started 12/11/2018)  ??  Untreated HIV infection; AIDS    Diagnosed in 2007 and was treatment naive on admission (had previously declined ART due to religious reasons).??His CD4 count was 142 and VL was 341,000 (8/20). He was noted to have oral thrush at [facility]. Started Tivacay/Descovy 8/26.??CD4 count 9/17 improved to 231.  - Plan for switch to [facility] on discharge. Dr. Dolores Frame has initiated process to coordinate outpatient follow-up and drug assistance enrollment.   - Will need bactrim prophylaxis for PJP (hold for now)   - Continue nystatin swishes for oral thrush    ASCVD Risk  Pooled Cohort 10-yr risk is 3%, however, HIV is an independent risk factor for CVD which is not included in the risk score. Furthermore, he already has significant atherosclerotic disease in his lower extremities and dyslipidemia. Given this, we feel it is appropriate to start him on a statin. A1c 5.5.   - continue atorvastatin 20mg  qd  - continue aspirin 81mg  qd  ??  Eosinophilia and strongyloidiasis   He has had eosinophilia (900 to 1000) as far back as 10/2010, per record review; this remains stable at this time. His??stool O&P, GIPP were??negative.??Strongyloid Ab??positive on??8/21.  -S/p course of ivermectin completed 9/13   ??  COVID-19 infection (resolved)  Asymptomatic. Completed 5 days remdesivir.??  Date of positive SARS-CoV-2 PCR: 12/09/2018 at OSH  - Patient was 21 days from positive PCR on 9/9 - and precautions discontinued.  ??      Objective:   Temp:  [36.7 ??C-37.1 ??C] 36.7 ??C  Heart Rate:  [92-102] 92  Resp:  [17-18] 17  BP: (120-133)/(68-76) 133/68  SpO2:  [98 %-99 %] 98 %    General: No acute distress, alert, oriented, answers questions appropriately  HEENT: atraumatic, sclera anicteric   Heart: RRR, no M/R/G  Lungs: Clear to auscultation bilaterally, no crackles or wheezes, no use of accessory muscles  Abdomen: Normoactive bowel sounds, soft, non-tender, non-distended, no rebound/guarding  Extremities: Black discoloration of the toes bilaterally with desquamation of thickened black epidermis. DP and TP pulses palpable. Feet are warm. Left hand bandaged over amputation site.  Neuro: Alert and oriented x 3.  0/5 strength in lower extremities.  Has sensation to light touch down to just above knees, no sensation to light touch below.  Psych: Appropriate mood and affect        Labs/Studies: Labs and studies reviewed and pertinent information is addressed in the plan  Charise Killian, MD  PGY-1, Internal Medicine  Pager #: [phone]

## 2019-01-09 LAB — MAGNESIUM: Magnesium:MCnc:Pt:Ser/Plas:Qn:: 1.8

## 2019-01-09 NOTE — Unmapped (Signed)
VSS.  No acute events.  Will continue to monitor.     Problem: Adult Inpatient Plan of Care  Goal: Absence of Hospital-Acquired Illness or Injury  Intervention: Identify and Manage Fall Risk  Flowsheets (Taken 01/09/2019 0606)  Safety Interventions: infection management  Goal: Readiness for Transition of Care  Intervention: Mutually Develop Transition Plan  Flowsheets (Taken 01/09/2019 0606)  Concerns to be Addressed: denies needs/concerns at this time     Problem: Fall Injury Risk  Goal: Absence of Fall and Fall-Related Injury  Intervention: Identify and Manage Contributors to Fall Injury Risk  Flowsheets (Taken 01/09/2019 0606)  Medication Review/Management: medications reviewed

## 2019-01-09 NOTE — Unmapped (Signed)
MRI on hold until screen form is completed in epic. Please answer all the questions in the form. If patient isn't able to answer the questions, please have immediate family member to help answer the questions and list their name and their number in the form. Patients needs to be in a hospital gown, IV should be med locked and medication patches should be removed prior to coming to MRI. Please call MRI at [phone] if you have any question. Thank you!

## 2019-01-09 NOTE — Unmapped (Signed)
Pt alert and oriented x4, pain well controlled with PRN PO pain medication, utilized once this shift as of this note - Pt states pain mostly in gangrenous finger/ penile lesion - antifungal cream applied to areas of rash - foley remains in place without incident,urine specimen collect from foley via clamp for lab testing, no result as of this note - will continue POC    Problem: Adult Inpatient Plan of Care  Goal: Plan of Care Review  Outcome: Progressing  Goal: Patient-Specific Goal (Individualization)  Outcome: Progressing  Goal: Absence of Hospital-Acquired Illness or Injury  Outcome: Progressing  Goal: Optimal Comfort and Wellbeing  Outcome: Progressing  Goal: Readiness for Transition of Care  Outcome: Progressing  Goal: Rounds/Family Conference  Outcome: Progressing     Problem: Infection  Goal: Infection Symptom Resolution  Outcome: Progressing     Problem: Fall Injury Risk  Goal: Absence of Fall and Fall-Related Injury  Outcome: Progressing     Problem: Self-Care Deficit  Goal: Improved Ability to Complete Activities of Daily Living  Outcome: Progressing     Problem: Wound  Goal: Optimal Wound Healing  Outcome: Progressing     Problem: Skin Injury Risk Increased  Goal: Skin Health and Integrity  Outcome: Progressing

## 2019-01-09 NOTE — Unmapped (Signed)
Rheumatology Consult Follow-up Note    Reason for consult: Don [name] is seen in consultation at the request of Dr. Ebony Hail, MD for evaluation of dry gangrene, vasculitis query.    Assessment: Don [name] is a 89yoM with PMH of untreated HIV who presented 12/10/2018 with acute bilateral LE foot pain, swelling, & coloration changes with LUE digit deformities & concern for auto-amputation. During his hospitalization he developed rapid onset LE weakness & paresthesias.  ??  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 Several months prior to hospitalization he underwent dental extraction & within 72 hours afterwards noticed his L 2nd & 3rd digits turned dark with 3rd digit ultimately auto-amputating. Over the last ~1 year he has experienced bilateral LE foot pain described as swelling & intermittent coloration changes. On admission he describes a rapid ~3 day progression of  LE ankle/foot swelling & discoloration of his toes. During hospitalization he developed cauda equina with imaging concerning for possible transverse myelitis possibly related to noted toxoplasmosis CNS infection.    ??  A unifying diagnosis to explain to explain his multiple symptoms - cauda equina - transverse myelitis, digit auto-amputation, bilateral foot cutaneous changes - has not been identified and he is at risk for multifactorial disease processes with his degree of immunosuppression. Despite treatment for multiple infectious etiologies including CNS toxoplasmosis detailed below he continues to have rising CRP & ESR and his neurological deficits are not significantly improved.    ??  Evaluation for his UE digit changes has noted relatively unremarkable vascular studies & TTE without suggestion of cardioembolic phenomena. There was otherwise no indication for intervention per vascular surgery. Consideration for coagulopathy pursued with multiple risk factors including HIV with low protein S, elevated ATIII noted to date. It remains unknown how preceding dental work might be related to this rapid progression of auto-amputation. DDx from a rheumatological perspective has not been convincing for a primary vasculitis; he is ANA(+) 1:80 speckled, (-)ENA; - low C4/normal C3, normal RF, relatively unremarkable ANCAs including in the setting of long-standing eosinophilia [?relation to strongyloides - uncertain with ongoing treatment for this infection initiated this hospitalization]; (-)cryoglobulins; he otherwise did not have noted preceding symptoms or current exam findings to suggest an underlying CTD. It is uncertain if his other infectious processes including HIV have contributed to a vasculitic process. With vasculitis consideration his LE cutaneous changes were biopsied without concern for vasculitis on 12/16/2018: superficial cutaneous mycosis with subacute eczematous alterations, lympho-eosinophil infiltrate and marked neutrophilic scale crust, consistent with dermatophyte infection; superficial dermal perivascular lymphocytic infiltrate. His digits have not been biopsied.    His rapid progression of LE neurological deficits prompted MRI & LP studies which have been most concerning for central CNS infectious processes: LP [12/12/2018]: OP 17, 100 cells [4% lymphs], 1 RBC, protein 210, glucose 50, (+)PCR toxoplasma & (+)EBV. Toxoplasmosis was thought to be causative for abnormal MRI findings of T2 signal in T10 - conus medullaris. After weeks of treatment for CNS infection his neurological deficits have minimally improved. Follow-up PET scan noted suspicion for intact inflammation in spinal cord without much cord edema to suggest benefit from steroids per neurology. It remains difficult to connect his neurological deficits this hospitalization to his digit amputations as above. Paraneoplastic/malignancy evaluation pursued with excisional axillary biopsy 12/31/2018 consistent with HIV lymphadenopathy.  ??  Recommendations:  -- At this time patient does not have a clear rheumatological diagnosis to explain his complicated symptoms noted above. It remains uncertain if these symptoms are necessarily related. Agree with extensive infectious & malignancy evaluation that has been pursued.   -- There was prior consideration to trial IV methylpred however this was subsequently held given infectious processes. Would continue to forgo escalated immunosuppression at this time without clear underlying diagnosis and ongoing treatment for concurrent infection.  -- Patient continues on multiple antimicrobials for infections & his inflammatory markers continue to rise; while we cannot necessarily attribute this to a rheumatological process at this time it warrants close monitoring.  ??  Patient seen and discussed with attending physician, Dr. Cheree Ditto  Please page consult pager if any questions.   We will sign off at this time, please call back if we can be of further assistance.    -------------------------------------------------    Interval History/Subjective:   NAEON. Patient feels similar today x 72 hours without changes including without neurological changes.      Current Medications:  ??? aspirin  81 mg Oral Daily   ??? atorvastatin  20 mg Oral Daily   ??? dolutegravir  50 mg Oral daily   ??? econazole nitrate   Topical BID   ??? emtricitabine-tenofovir alafen  1 tablet Oral Daily   ??? enoxaparin (LOVENOX) injection  30 mg Subcutaneous Q12H [facility]   ??? gabapentin  100 mg Oral TID   ??? griseofulvin  375 mg Oral BID   ??? leucovorin  10 mg Oral Daily   ??? nystatin  500,000 Units Oral 4x Daily   ??? polyethylene glycol  17 g Oral BID   ??? pyrimethamine  75 mg Oral Daily   ??? senna  2 tablet Oral Nightly   ??? sulfaDIAZINE  1,500 mg Oral Q6H   ??? valACYclovir  1,000 mg Oral BID     Infusion: (none)  PRN: acetaminophen, alteplase, alteplase, aluminum-magnesium hydroxide-simethicone, bisacodyL, calcium carbonate, dextrose 50 % in water (D50W), glucagon, guaiFENesin, LORazepam, LORazepam, melatonin, naloxone, ondansetron **OR** ondansetron, oxyCODONE      Objective:  Vitals:  Temp:  [36.7 ??C-37.1 ??C] 36.7 ??C  Heart Rate:  [92-102] 92  Resp:  [17-18] 17  BP: (120-133)/(68-76) 133/68  SpO2:  [98 %-99 %] 98 %    Physical Exam:    GEN: laying supine; NAD; appears comfortable  EENT: no mucosal or nasal lesions; MMM; no posterior pharynx erythema  EYEs: normal sclera; no conjunctival injection  CARDS: RRR; no g/r/m;   PULM: CTAB without crackles, wheezing, or coarse breath sounds; no coughing  GI: abdomen is soft, nontender, nondistended; no palpable hepatosplenomegaly  SKIN: bilateral LE feet with dorsal aspect of skin with residual cutaneous darkening & scaling extending to ankle - improved from imaging; LUE 3rd digit with bandage; mild nail pitting 2nd nail LUE  NEURO: Strength: 0/5 bilateral LE diffusely; decreased sensation to light touch bilateral LE diffusely; unable to elicit bilateral femoral pulses; UE with 5/5 strength, normal sensation to light touch    Labs:   Lab Results   Component Value Date    WBC 4.3 (L) 01/08/2019    HGB 12.0 (L) 01/08/2019    HCT 36.7 (L) 01/08/2019    PLT 363 01/08/2019       Lab Results   Component Value Date    NA 135 01/08/2019    K 4.5 01/08/2019    CL 100 01/08/2019    CO2 25.0  01/08/2019    BUN 11 01/08/2019    CREATININE 0.83 01/08/2019    GLU 152 01/08/2019    CALCIUM 9.1 01/08/2019    MG 1.8 01/08/2019    PHOS 3.5 11/05/2010       Lab Results   Component Value Date    BILITOT 0.5 01/08/2019    BILIDIR 0.10 12/12/2018    PROT 7.7 01/08/2019    ALBUMIN 3.8 01/08/2019    ALT 40 01/08/2019    AST 43 01/08/2019    ALKPHOS 108 01/08/2019    GGT 54 11/05/2010       Lab Results   Component Value Date    INR 1.03 12/26/2018    APTT 31.4 12/26/2018

## 2019-01-10 LAB — CBC W/ AUTO DIFF
BASOPHILS RELATIVE PERCENT: 1.8 %
EOSINOPHILS ABSOLUTE COUNT: 0.8 10*9/L — ABNORMAL HIGH (ref 0.0–0.4)
EOSINOPHILS RELATIVE PERCENT: 16.2 %
HEMATOCRIT: 39 % — ABNORMAL LOW (ref 41.0–53.0)
HEMOGLOBIN: 12.7 g/dL — ABNORMAL LOW (ref 13.5–17.5)
LARGE UNSTAINED CELLS: 3 % (ref 0–4)
LYMPHOCYTES ABSOLUTE COUNT: 1.5 10*9/L (ref 1.5–5.0)
LYMPHOCYTES RELATIVE PERCENT: 30.5 %
MEAN CORPUSCULAR HEMOGLOBIN CONC: 32.6 g/dL (ref 31.0–37.0)
MEAN CORPUSCULAR HEMOGLOBIN: 27.6 pg (ref 26.0–34.0)
MEAN PLATELET VOLUME: 7.8 fL (ref 7.0–10.0)
MONOCYTES ABSOLUTE COUNT: 0.5 10*9/L (ref 0.2–0.8)
MONOCYTES RELATIVE PERCENT: 9.6 %
NEUTROPHILS ABSOLUTE COUNT: 1.9 10*9/L — ABNORMAL LOW (ref 2.0–7.5)
NEUTROPHILS RELATIVE PERCENT: 38.5 %
PLATELET COUNT: 350 10*9/L (ref 150–440)
RED BLOOD CELL COUNT: 4.61 10*12/L (ref 4.50–5.90)
RED CELL DISTRIBUTION WIDTH: 15.8 % — ABNORMAL HIGH (ref 12.0–15.0)
WBC ADJUSTED: 4.8 10*9/L (ref 4.5–11.0)

## 2019-01-10 LAB — BASIC METABOLIC PANEL
ANION GAP: 8 mmol/L (ref 7–15)
BLOOD UREA NITROGEN: 9 mg/dL (ref 7–21)
BUN / CREAT RATIO: 17
CALCIUM: 9.1 mg/dL (ref 8.5–10.2)
CHLORIDE: 101 mmol/L (ref 98–107)
CO2: 24 mmol/L (ref 22.0–30.0)
CREATININE: 0.54 mg/dL — ABNORMAL LOW (ref 0.70–1.30)
EGFR CKD-EPI AA MALE: >90 mL/min/{1.73_m2} (ref >=60)
EGFR CKD-EPI NON-AA MALE: >90 mL/min/{1.73_m2} (ref >=60)
GLUCOSE RANDOM: 188 mg/dL — ABNORMAL HIGH (ref 70–179)
POTASSIUM: 4 mmol/L (ref 3.5–5.0)

## 2019-01-10 LAB — SODIUM: Sodium:SCnc:Pt:Ser/Plas:Qn:: 133 — ABNORMAL LOW

## 2019-01-10 LAB — MAGNESIUM: Magnesium:MCnc:Pt:Ser/Plas:Qn:: 1.8

## 2019-01-10 LAB — BASOPHILS ABSOLUTE COUNT: Lab: 0.1

## 2019-01-10 NOTE — Unmapped (Signed)
Infectious Disease ([facility]) Progress Note    Subjective and 24-hour Events:   No acute events overnight. Pt has no complaints today. He and his wife are in the process of discussion next steps (e.g. AIR, SNF, home health) and will make a decision this week. We had a brief discussion about sexually transmitted infections and the importance of getting his wife tested for HIV. He states that she has been tested (negative) and that they use condoms.    Assessment & Plan:   Don [name]??is a 43 y.o.??male??with??previously untreated HIV/AIDS??(CD4 142 on admission) who initially presented with dry gangrene to the 2nd and 3rd digits of L hand and pain/weakness/discoloration over his lower extremities, subsequently found to be COVID??+ without respiratory difficulties (now off precautions), hospital course complicated by cauda equina syndrome leading to LE paralysis with urinary retention, found to have patchy hyperintensities in the spine and brain on MRI c/w transverse myelitis. Workup notable for Toxoplasma and EBV in his CSF, as well as CMV+ low level in the peripheral blood but negative in CSF. He was most recently found to have significant diffuse lymphadenopathy on PET scan concerning for lymphoma and is now s/p left axillary excisional lymph node biopsy on 9/10.   ??  Cauda Equina syndrome  MRI consistent with transverse myelitis, possibly secondary to toxoplasmosis; however, his LE paralysis and numbness have persisted despite a month of treatment for toxoplasmosis, which indicates that he may have neural scar formation and a guarded prognosis for neurologic recovery. Lymph node biopsy negative for malignancy and organisms and showed inflammatory changes consistent with chronic HIV. Neurology consulted again on 9/17 and do not recommend follow up imaging at this point since the clinical picture has not changed.   - Ordered brain MRI to assess stability of the enhancing lesion in the brain.  If it does appear to be responding to antimicrobials, is likely toxoplasmosis.  If remains stable or enlarging despite treatment, will consider a brain biopsy to rule out CNS lymphoma.  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  Anticipate a 4 to 6-week course and then transition to Bactrim after completion for PJP prophylaxis and toxo maintenance  - f/u arboviral antibody panel (collected 8/22)   - Consider transition to I&O caths when UOP consistently less than 2.4 liters.   ????????????????????  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  Patient was started on valacyclovir 1000 mg twice daily for 10 days. Will reassess after that to see if the ulceration resolves???may extend if need be. Urine GC/CT negative.   - Continue valacyclovir 1000mg  bid (9/18-9/27)    Dry gangrene L hand    Etiology remains unclear. Dermatology suspected macro-occlusive process (embolic vs coagulopathic vs vascular). He states that he noticed color changes in his fingers following extraction of several teeth and the gangrene got progressively worse after that. TTE 8/20 did not show any vegetations indicative of endocarditis, though it is possible that there was a small vegetations too small to visualize. Bilateral UE arterial PVLs without any HD-significant findings and blood cultures obtained in the ED were negative. Rheumatology did not suspect vasculitis on their initial consult after admission. They were consulted this morning (9/16) for some abnormal rheumatologic/immunologic labs (ANA+, uptrending ESR/CRP) and again do not suspect vasculitis or an underlying rheumatologic disease. Vascular surgery consulted 8/21 and again on 9/13 and determined that it was likely not vascular in etiology. Hematology consulted 9/14 and while they acknowledge that he is a  thrombotic risk due to his HIV and several concomitant infections, they do not recommend full anticoagulation due to negative hypercoagulability workup so far and no proof of thrombosis. Protein S activity was low with normal Protein C and elevated ATIII activity. However, they do agree that he has early atherosclerotic disease and recommended baby aspirin and agree with starting a statin.   - Outpatient follow-up in hand clinic   - bandage changes daily  -We will follow-up with heme for their thoughts.  ??  Discoloration bilateral lower extremities   Etiology remains unclear. Upon further close review with the infectious disease team, this does not appear to be gangrene. His DP and PT pulses are palpable and the feet are warm, which is reassuring. The plantar surface of the feet look good. 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 Given that the skin lesions seem restricted to the dorsum of the foot, this may just be a severe dermatophyte infection in a highly immunocompromised host. Itraconazole started 9/14. Per Pharmacy team, griseofulvin is a better option for his tinea pedis and onychomycosis, so we switched to that on 9/16.  - continue griseofulvin 1000mg  suspension daily (6 month treatment course)  - Continue to monitor  ??  Tinea pedis   Trichophyton confirmed on biopsy.   - Griseofulvin as above  - Continue econazole cream to bilateral feet BID (started 12/11/2018)  ??  Untreated HIV infection; AIDS    Diagnosed in 2007 and was treatment naive on admission (had previously declined ART due to religious reasons).??His CD4 count was 142 and VL was 341,000 (8/20). He was noted to have oral thrush at [facility] Started Tivacay/Descovy 8/26.??CD4 count 9/17 improved to 231.  - Plan for switch to [facility] on discharge. Dr. Dolores Frame has initiated process to coordinate outpatient follow-up and drug assistance enrollment.   - Will need bactrim prophylaxis for PJP (hold for now)   - Continue nystatin swishes for oral thrush    ASCVD Risk  Pooled Cohort 10-yr risk is 3%, however, HIV is an independent risk factor for CVD which is not included in the risk score. Furthermore, he already has significant atherosclerotic disease in his lower extremities and dyslipidemia. Given this, we feel it is appropriate to start him on a statin. A1c 5.5.   - continue atorvastatin 20mg  qd  - continue aspirin 81mg  qd  ??  Eosinophilia and strongyloidiasis   He has had eosinophilia (900 to 1000) as far back as 10/2010, per record review; this remains stable at this time. His??stool O&P, GIPP were??negative.??Strongyloid Ab??positive on??8/21.  -S/p course of ivermectin completed 9/13   ??  COVID-19 infection (resolved)  Asymptomatic. Completed 5 days remdesivir.??  Date of positive SARS-CoV-2 PCR: 12/09/2018 at OSH  - Patient was 21 days from positive PCR on 9/9 - and precautions discontinued.  ??      Objective:   Temp:  [36.7 ??C (98.1 ??F)-37.3 ??C (99.1 ??F)] 37 ??C (98.6 ??F)  Heart Rate:  [98-117] 98  Resp:  [14-18] 14  BP: (122-134)/(72-79) 122/79  SpO2:  [97 %-99 %] 99 %    General: No acute distress, alert, oriented, answers questions appropriately  HEENT: atraumatic, sclera anicteric   Heart: RRR, no M/R/G  Lungs: Clear to auscultation bilaterally, no crackles or wheezes, no use of accessory muscles  Abdomen: Normoactive bowel sounds, soft, non-tender, non-distended, no rebound/guarding  Extremities: Foot exam deferred due to air  boots. L index finger with normal color, L middle finger bandaged  Neuro: Alert and oriented x 3.  0/5 strength in lower extremities.  Has sensation to light touch down to just above knees, no sensation to light touch below.  GU: Penile lesions improving  Psych: Appropriate mood and affect      Labs/Studies: Labs and studies reviewed and pertinent information is addressed in the plan      Eustaquio Boyden ([id number])      I attest that I have reviewed the student note and that the components of the history of the present illness, the physical exam, and the assessment and plan documented were performed by me or were performed in my presence by the student where I verified the documentation and performed (or re-performed) the exam and medical decision making.    Charise Killian, MD  PGY-1, Internal Medicine

## 2019-01-10 NOTE — Unmapped (Signed)
Thomson had an uneventful day. Day began with a 1L LR bolus. Foley remains intact with decent UOP. Urine yellow/amber with sediment. Incontinent of stool x2. Nausea/vomiting x1 after food stuck in throat. Says it happens fairly frequently. Pain controlled well with PO meds. Remains in bed with heel protectors in place. No acute concerns, will continue POC.    Problem: Adult Inpatient Plan of Care  Goal: Plan of Care Review  Outcome: Progressing  Goal: Patient-Specific Goal (Individualization)  Outcome: Progressing  Goal: Absence of Hospital-Acquired Illness or Injury  Outcome: Progressing  Goal: Optimal Comfort and Wellbeing  Outcome: Progressing  Goal: Readiness for Transition of Care  Outcome: Progressing  Goal: Rounds/Family Conference  Outcome: Progressing     Problem: Infection  Goal: Infection Symptom Resolution  Outcome: Progressing     Problem: Fall Injury Risk  Goal: Absence of Fall and Fall-Related Injury  Outcome: Progressing     Problem: Self-Care Deficit  Goal: Improved Ability to Complete Activities of Daily Living  Outcome: Progressing     Problem: Wound  Goal: Optimal Wound Healing  Outcome: Progressing     Problem: Skin Injury Risk Increased  Goal: Skin Health and Integrity  Outcome: Progressing

## 2019-01-10 NOTE — Unmapped (Signed)
Don [name] is doing well today. Repositions self independently. Took good PO intake and fluids. Decent UOP. Foley intact. BM x 1 this shift. Pain in finger controlled well with PO meds. PIV remains patent. Wife visited this afternoon. Protective boots placed to bilateral feet. No acute concerns, will continue POC.    Problem: Adult Inpatient Plan of Care  Goal: Plan of Care Review  Outcome: Progressing  Goal: Patient-Specific Goal (Individualization)  Outcome: Progressing  Goal: Absence of Hospital-Acquired Illness or Injury  Outcome: Progressing  Goal: Optimal Comfort and Wellbeing  Outcome: Progressing  Goal: Readiness for Transition of Care  Outcome: Progressing  Goal: Rounds/Family Conference  Outcome: Progressing     Problem: Infection  Goal: Infection Symptom Resolution  Outcome: Progressing     Problem: Fall Injury Risk  Goal: Absence of Fall and Fall-Related Injury  Outcome: Progressing     Problem: Self-Care Deficit  Goal: Improved Ability to Complete Activities of Daily Living  Outcome: Progressing     Problem: Wound  Goal: Optimal Wound Healing  Outcome: Progressing     Problem: Skin Injury Risk Increased  Goal: Skin Health and Integrity  Outcome: Progressing

## 2019-01-10 NOTE — Unmapped (Signed)
VSS.  No acute events.  Will continue to monitor.     Problem: Adult Inpatient Plan of Care  Goal: Absence of Hospital-Acquired Illness or Injury  Intervention: Prevent Skin Injury  Flowsheets (Taken 01/10/2019 0626)  Pressure Reduction Techniques: frequent weight shift encouraged  Intervention: Prevent Infection  Flowsheets (Taken 01/10/2019 0626)  Infection Prevention: cohorting utilized     Problem: Fall Injury Risk  Goal: Absence of Fall and Fall-Related Injury  Intervention: Identify and Manage Contributors to Fall Injury Risk  Flowsheets (Taken 01/10/2019 0626)  Self-Care Promotion: independence encouraged     Problem: Wound  Goal: Optimal Wound Healing  Intervention: Promote Effective Wound Healing  Flowsheets (Taken 01/10/2019 1610)  Pain Management Interventions: care clustered

## 2019-01-10 NOTE — Unmapped (Signed)
Infectious Disease ([facility]) Progress Note    Subjective and 24-hour Events:   No acute events overnight. Pt has no complaints today. He and his wife had questions about next steps for once he is able to leave the hospital. Discussed options including AIR (if he were to qualify), SNF, and home with home health. Patient and wife said they will think about it and let us know.     Assessment & Plan:   Don [name]??is a 43 y.o.??male??with??previously untreated HIV/AIDS??(CD4 142 on admission) who initially presented with dry gangrene to the 2nd and 3rd digits of L hand and pain/weakness/discoloration over his lower extremities, subsequently found to be COVID??+ without respiratory difficulties (now off precautions), hospital course complicated by cauda equina syndrome leading to LE paralysis with urinary retention, found to have patchy hyperintensities in the spine and brain on MRI c/w transverse myelitis. Workup notable for Toxoplasma and EBV in his CSF, as well as CMV+ low level in the peripheral blood but negative in CSF. He was most recently found to have significant diffuse lymphadenopathy on PET scan concerning for lymphoma and is now s/p left axillary excisional lymph node biopsy on 9/10.   ??  Cauda Equina syndrome  MRI consistent with transverse myelitis, possibly secondary to toxoplasmosis; however, his LE paralysis and numbness have persisted despite a month of treatment for toxoplasmosis, which indicates that he may have neural scar formation and a guarded prognosis for neurologic recovery. Lymph node biopsy negative for malignancy and organisms and showed inflammatory changes consistent with chronic HIV. Neurology consulted again on 9/17 and do not recommend follow up imaging at this point since the clinical picture has not changed.   -Ordered brain MRI to assess stability of the enhancing lesion in the brain.  If it does appear to be responding to antimicrobials, is likely toxoplasmosis.  If remains stable or enlarging despite treatment, will consider a brain biopsy to rule out CNS lymphoma.  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  Anticipate a 4 to 6-week course and then transition to Bactrim after completion for PJP prophylaxis and toxo maintenance  - f/u arboviral antibody panel (collected 8/22)   - Consider transition to I&O caths when UOP consistently less than 2.4 liters.   ????????????????????  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  Patient will be started on valganciclovir 1000 mg twice daily for 10 days.  Will reassess after that to see if the ulceration resolves???may extend if need be..  -Urine chlamydia and gonorrhea negative.    Dry gangrene L hand    Etiology remains unclear. Dermatology suspected macro-occlusive process (embolic vs coagulopathic vs vascular). He states that he noticed color changes in his fingers following extraction of several teeth and the gangrene got progressively worse after that. TTE 8/20 did not show any vegetations indicative of endocarditis, though it is possible that there was a small vegetations too small to visualize. Bilateral UE arterial PVLs without any HD-significant findings and blood cultures obtained in the ED were negative. Rheumatology did not suspect vasculitis on their initial consult after admission. They were consulted this morning (9/16) for some abnormal rheumatologic/immunologic labs (ANA+, uptrending ESR/CRP) and again do not suspect vasculitis or an underlying rheumatologic disease. Vascular surgery consulted 8/21 and again on 9/13 and determined that it was likely not vascular in etiology. Hematology consulted 9/14 and while they acknowledge that he is a thrombotic risk due to his HIV and several concomitant infections, they do not  recommend full anticoagulation due to negative hypercoagulability workup so far and no proof of thrombosis. Protein S activity was low with normal Protein C and elevated ATIII activity. However, they do agree that he has early atherosclerotic disease and recommended baby aspirin and agree with starting a statin.   - Outpatient follow-up in hand clinic   - bandage changes daily  -We will follow-up with heme for their thoughts.  ??  Discoloration bilateral lower extremities   Etiology remains unclear. Upon further close review with the infectious disease team, this does not appear to be gangrene. His DP and PT pulses are palpable and the feet are warm, which is reassuring. The plantar surface of the feet look good. 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 Given that the skin lesions seem restricted to the dorsum of the foot, this may just be a severe dermatophyte infection in a highly immunocompromised host. Itraconazole started 9/14. Per Pharmacy team, griseofulvin is a better option for his tinea pedis and onychomycosis, so we switched to that on 9/16.  - continue griseofulvin 1000mg  suspension daily (6 month treatment course)  - Continue to monitor  ??  Tinea pedis   Trichophyton confirmed on biopsy.   - Griseofulvin as above  - Continue econazole cream to bilateral feet BID (started 12/11/2018)  ??  Untreated HIV infection; AIDS    Diagnosed in 2007 and was treatment naive on admission (had previously declined ART due to religious reasons).??His CD4 count was 142 and VL was 341,000 (8/20). He was noted to have oral thrush at [facility]. Started Tivacay/Descovy 8/26.??CD4 count 9/17 improved to 231.  - Plan for switch to [facility] on discharge. Dr. Dolores Frame has initiated process to coordinate outpatient follow-up and drug assistance enrollment.   - Will need bactrim prophylaxis for PJP (hold for now)   - Continue nystatin swishes for oral thrush    ASCVD Risk  Pooled Cohort 10-yr risk is 3%, however, HIV is an independent risk factor for CVD which is not included in the risk score. Furthermore, he already has significant atherosclerotic disease in his lower extremities and dyslipidemia. Given this, we feel it is appropriate to start him on a statin. A1c 5.5.   - continue atorvastatin 20mg  qd  - continue aspirin 81mg  qd  ??  Eosinophilia and strongyloidiasis   He has had eosinophilia (900 to 1000) as far back as 10/2010, per record review; this remains stable at this time. His??stool O&P, GIPP were??negative.??Strongyloid Ab??positive on??8/21.  -S/p course of ivermectin completed 9/13   ??  COVID-19 infection (resolved)  Asymptomatic. Completed 5 days remdesivir.??  Date of positive SARS-CoV-2 PCR: 12/09/2018 at OSH  - Patient was 21 days from positive PCR on 9/9 - and precautions discontinued.  ??      Objective:   Temp:  [36.7 ??C-37 ??C] 36.7 ??C  Heart Rate:  [96-116] 116  Resp:  [17-18] 18  BP: (126-131)/(72-79) 129/72  SpO2:  [97 %-99 %] 99 %    General: No acute distress, alert, oriented, answers questions appropriately  HEENT: atraumatic, sclera anicteric   Heart: RRR, no M/R/G  Lungs: Clear to auscultation bilaterally, no crackles or wheezes, no use of accessory muscles  Abdomen: Normoactive bowel sounds, soft, non-tender, non-distended, no rebound/guarding  Extremities: Black discoloration of the toes bilaterally with desquamation of thickened black epidermis. DP and TP pulses palpable. Feet are warm. Left hand bandaged over amputation  site.  Neuro: Alert and oriented x 3.  0/5 strength in lower extremities.  Has sensation to light touch down to just above knees, no sensation to light touch below.  Psych: Appropriate mood and affect        Labs/Studies: Labs and studies reviewed and pertinent information is addressed in the plan      Audelia Acton, MD  Internal Medicine-Pediatrics PGY3  Pager # [phone]

## 2019-01-11 DIAGNOSIS — D721 Eosinophilia, unspecified: Secondary | ICD-10-CM

## 2019-01-11 DIAGNOSIS — R32 Unspecified urinary incontinence: Secondary | ICD-10-CM

## 2019-01-11 DIAGNOSIS — D649 Anemia, unspecified: Secondary | ICD-10-CM

## 2019-01-11 DIAGNOSIS — G8220 Paraplegia, unspecified: Secondary | ICD-10-CM

## 2019-01-11 DIAGNOSIS — R202 Paresthesia of skin: Secondary | ICD-10-CM

## 2019-01-11 DIAGNOSIS — G834 Cauda equina syndrome: Secondary | ICD-10-CM

## 2019-01-11 DIAGNOSIS — E162 Hypoglycemia, unspecified: Secondary | ICD-10-CM

## 2019-01-11 DIAGNOSIS — K592 Neurogenic bowel, not elsewhere classified: Secondary | ICD-10-CM

## 2019-01-11 DIAGNOSIS — G4700 Insomnia, unspecified: Secondary | ICD-10-CM

## 2019-01-11 DIAGNOSIS — Z89022 Acquired absence of left finger(s): Secondary | ICD-10-CM

## 2019-01-11 DIAGNOSIS — R339 Retention of urine, unspecified: Secondary | ICD-10-CM

## 2019-01-11 DIAGNOSIS — G373 Acute transverse myelitis in demyelinating disease of central nervous system: Secondary | ICD-10-CM

## 2019-01-11 DIAGNOSIS — E785 Hyperlipidemia, unspecified: Secondary | ICD-10-CM

## 2019-01-11 DIAGNOSIS — B20 Human immunodeficiency virus [HIV] disease: Secondary | ICD-10-CM

## 2019-01-11 DIAGNOSIS — B370 Candidal stomatitis: Secondary | ICD-10-CM

## 2019-01-11 DIAGNOSIS — U071 COVID-19: Secondary | ICD-10-CM

## 2019-01-11 DIAGNOSIS — I70203 Unspecified atherosclerosis of native arteries of extremities, bilateral legs: Secondary | ICD-10-CM

## 2019-01-11 DIAGNOSIS — B353 Tinea pedis: Secondary | ICD-10-CM

## 2019-01-11 DIAGNOSIS — B789 Strongyloidiasis, unspecified: Secondary | ICD-10-CM

## 2019-01-11 DIAGNOSIS — B5889 Toxoplasmosis with other organ involvement: Secondary | ICD-10-CM

## 2019-01-11 DIAGNOSIS — E876 Hypokalemia: Secondary | ICD-10-CM

## 2019-01-11 DIAGNOSIS — B351 Tinea unguium: Secondary | ICD-10-CM

## 2019-01-11 DIAGNOSIS — I96 Gangrene, not elsewhere classified: Secondary | ICD-10-CM

## 2019-01-11 LAB — MAGNESIUM: Magnesium:MCnc:Pt:Ser/Plas:Qn:: 1.7

## 2019-01-11 NOTE — Unmapped (Signed)
VSS.  No acute events.  Will continue to monitor.     Problem: Adult Inpatient Plan of Care  Goal: Absence of Hospital-Acquired Illness or Injury  Intervention: Prevent Skin Injury  Flowsheets (Taken 01/11/2019 0635)  Pressure Reduction Techniques:   frequent weight shift encouraged   positioned off wounds     Problem: Infection  Goal: Infection Symptom Resolution  Intervention: Prevent or Manage Infection  Flowsheets (Taken 01/11/2019 1610)  Infection Management: aseptic technique maintained

## 2019-01-11 NOTE — Unmapped (Signed)
Infectious Disease (MDK) Progress Note    Subjective and 24-hour Events:   No acute events overnight. Pt vomited this morning, states he could feel the food and medicine in his throat and it made him feel nauseous. Feels a little better later this morning but appetite is still not at baseline.     He and his wife have been discussing dispo options. If possible, the patient would prefer to do his rehab at home. We discussed the pros and cons of AIR here vs home care. PT thinks he would qualify for AIR.      He also noted escalofrios (chills) going down his bilateral legs at night. He says they are not painful but feel very cold and strange. Pt was working with PT during the interview today.    Assessment & Plan:   Don [name]??is a 43 y.o.??male??with??previously untreated HIV/AIDS??(CD4 142 on admission) who initially presented with dry gangrene to the 2nd and 3rd digits of L hand and pain/weakness/discoloration over his lower extremities, subsequently found to be COVID??+ without respiratory difficulties (now off precautions), hospital course complicated by cauda equina syndrome leading to LE paralysis with urinary retention, found to have patchy hyperintensities in the spine and brain on MRI c/w transverse myelitis. Workup notable for Toxoplasma and EBV in his CSF, as well as CMV+ low level in the peripheral blood but negative in CSF. He was most recently found to have significant diffuse lymphadenopathy on PET scan concerning for lymphoma and is now s/p left axillary excisional lymph node biopsy on 9/10, which was negative for malignancy and organisms.   ??  Cauda Equina syndrome  MRI consistent with transverse myelitis, possibly secondary to toxoplasmosis; however, his LE paralysis and numbness have persisted despite a month of treatment for toxoplasmosis, which indicates that he may have neural scar formation and a guarded prognosis for neurologic recovery. Lymph node biopsy negative for malignancy and organisms and showed inflammatory changes consistent with chronic HIV. Neurology consulted again on 9/17 and do not recommend follow up imaging at this point since the clinical picture has not changed. On 9/21 the patient noted chills going down his legs for the past few days, more often at night. He says these aren't very painful, but feel cold and strange.   - F/u brain MRI - If it does appear to be responding to antimicrobials, is likely toxoplasmosis.  If remains stable or enlarging despite treatment, will consider a brain biopsy to rule out CNS lymphoma.  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  Anticipate a 4 to 6-week course and then transition to Bactrim after completion for PJP prophylaxis and toxo maintenance  - f/u arboviral antibody panel (collected 8/22)   - Consider transition to I&O caths when UOP consistently less than 2.4 liters.   ????????????????????  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  Patient was started on valacyclovir 1000 mg twice daily for 10 days. Will reassess after that to see if the ulceration resolves???may extend if need be. Urine GC/CT negative. On 9/20 the lesions seem to be improving.  - Continue valacyclovir 1000mg  bid (9/18-9/27)    Dry gangrene L hand    Etiology remains unclear. Dermatology suspected macro-occlusive process (embolic vs coagulopathic vs vascular). He states that he noticed color changes in his fingers following extraction of several teeth and the gangrene got progressively worse after that. TTE 8/20 did not show any vegetations indicative of endocarditis, though it is possible that there  was a small vegetations too small to visualize. Bilateral UE arterial PVLs without any HD-significant findings and blood cultures obtained in the ED were negative. Rheumatology did not suspect vasculitis on their initial consult after admission. They were consulted this morning (9/16) for some abnormal rheumatologic/immunologic labs (ANA+, uptrending ESR/CRP) and again do not suspect vasculitis or an underlying rheumatologic disease. Vascular surgery consulted 8/21 and again on 9/13 and determined that it was likely not vascular in etiology. Hematology consulted 9/14 and while they acknowledge that he is a thrombotic risk due to his HIV and several concomitant infections, they do not recommend full anticoagulation due to negative hypercoagulability workup so far and no proof of thrombosis. Protein S activity was low with normal Protein C and elevated ATIII activity. Heme reconsulted 9/21 about coag study results, and they are not concerned about these values. Protein S deficiency is uncommon, difficult to diagnose due to wide normal  variation and sensitivity to inflammatory changes, and is typically associated with VTE and not arterial thrombosis which would better explain the gangrene. However, they do agree that he has early atherosclerotic disease and recommended baby aspirin and agree with starting a statin.   - Outpatient follow-up in hand clinic   - bandage changes daily  ??  Discoloration bilateral lower extremities   Etiology remains unclear. Upon further close review with the infectious disease team, this does not appear to be gangrene. His DP and PT pulses are palpable and the feet are warm, which is reassuring. The plantar surface of the feet look good. 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 Given that the skin lesions seem restricted to the dorsum of the foot, this may just be a severe dermatophyte infection in a highly immunocompromised host. Itraconazole started 9/14. Per Pharmacy team, griseofulvin is a better option for his tinea pedis and onychomycosis, so we switched to that on 9/16. On 9/21 the scab from his biopsy site on L foot came off and started bleeding. This superficial wound does not appear to be healing well and so we will consult the wound nurse for dressing.  - wound nurse order placed 9/21  - continue griseofulvin 1000mg  suspension daily (6 month treatment course)  - Continue to monitor  ??  Tinea pedis   Trichophyton confirmed on biopsy.   - Griseofulvin as above  - Continue econazole cream to bilateral feet BID (started 12/11/2018)  ??  Untreated HIV infection; AIDS    Diagnosed in 2007 and was treatment naive on admission (had previously declined ART due to religious reasons).??His CD4 count was 142 and VL was 341,000 (8/20). He was noted to have oral thrush at [facility]. Started Tivacay/Descovy 8/26.??CD4 count 9/17 improved to 231.  - Plan for switch to [facility] on discharge. Dr. Dolores Frame has initiated process to coordinate outpatient follow-up and drug assistance enrollment.   - Will need bactrim prophylaxis for PJP (hold for now)   - Continue nystatin swishes for oral thrush    ASCVD Risk  Pooled Cohort 10-yr risk is 3%, however, HIV is an independent risk factor for CVD which is not included in the risk score. Furthermore, he already has significant atherosclerotic disease in his lower extremities and dyslipidemia. Given this, we feel it is appropriate to start him on a statin. A1c 5.5.   - continue atorvastatin 20mg  qd  - continue aspirin 81mg  qd  ??  Eosinophilia and strongyloidiasis  He has had eosinophilia (900 to 1000) as far back as 10/2010, per record review; this remains stable at this time. His??stool O&P, GIPP were??negative.??Strongyloid Ab??positive on??8/21.  -S/p course of ivermectin completed 9/13   ??  COVID-19 infection (resolved)  Asymptomatic. Completed 5 days remdesivir.??  Date of positive SARS-CoV-2 PCR: 12/09/2018 at OSH  - Patient was 21 days from positive PCR on 9/9 - and precautions discontinued.  ??      Objective:   Temp:  [36.5 ??C (97.7 ??F)-37 ??C (98.6 ??F)] 37 ??C (98.6 ??F)  Heart Rate:  [86-95] 86  Resp:  [16-17] 16  BP: (126-129)/(77-80) 129/80 SpO2:  [98 %-100 %] 98 %    General: No acute distress, alert, oriented, answers questions appropriately, sitting up on the edge of the bed with the help of the PT team  HEENT: atraumatic, sclera anicteric   Heart: RRR, no M/R/G  Lungs: Clear to auscultation bilaterally, no crackles or wheezes, no use of accessory muscles  Abdomen: Normoactive bowel sounds, soft, non-tender, non-distended, no rebound/guarding  Extremities: Foot exam with bilateral thick blackened scale on toes with discoloration of dorsal feet, unchanged from prior exam.  L index finger with normal color, L middle finger bandaged  Neuro: Alert and oriented x 3.  0/5 strength in lower extremities.  Has sensation to light touch down to just above knees, no sensation to light touch below.  GU: defer today  Psych: Appropriate mood and affect      Labs/Studies: Labs and studies reviewed and pertinent information is addressed in the plan      Eustaquio Boyden (MS3)    I attest that I have reviewed the student note and that the components of the history of the present illness, the physical exam, and the assessment and plan documented were performed by me or were performed in my presence by the student where I verified the documentation and performed (or re-performed) the exam and medical decision making.    Charise Killian, MD  PGY-1, Internal Medicine

## 2019-01-12 LAB — CBC W/ AUTO DIFF
BASOPHILS ABSOLUTE COUNT: 0.1 10*9/L (ref 0.0–0.1)
BASOPHILS RELATIVE PERCENT: 1.5 %
EOSINOPHILS ABSOLUTE COUNT: 0.7 10*9/L — ABNORMAL HIGH (ref 0.0–0.4)
EOSINOPHILS RELATIVE PERCENT: 14 %
HEMATOCRIT: 36.2 % — ABNORMAL LOW (ref 41.0–53.0)
HEMOGLOBIN: 12.4 g/dL — ABNORMAL LOW (ref 13.5–17.5)
LARGE UNSTAINED CELLS: 4 % (ref 0–4)
LYMPHOCYTES ABSOLUTE COUNT: 1.6 10*9/L (ref 1.5–5.0)
MEAN CORPUSCULAR HEMOGLOBIN CONC: 34.3 g/dL (ref 31.0–37.0)
MEAN CORPUSCULAR HEMOGLOBIN: 29 pg (ref 26.0–34.0)
MEAN CORPUSCULAR VOLUME: 84.6 fL (ref 80.0–100.0)
MEAN PLATELET VOLUME: 7.3 fL (ref 7.0–10.0)
MONOCYTES ABSOLUTE COUNT: 0.4 10*9/L (ref 0.2–0.8)
MONOCYTES RELATIVE PERCENT: 8.5 %
NEUTROPHILS ABSOLUTE COUNT: 2.1 10*9/L (ref 2.0–7.5)
NEUTROPHILS RELATIVE PERCENT: 41.4 %
PLATELET COUNT: 352 10*9/L (ref 150–440)
RED BLOOD CELL COUNT: 4.28 10*12/L — ABNORMAL LOW (ref 4.50–5.90)
RED CELL DISTRIBUTION WIDTH: 15.8 % — ABNORMAL HIGH (ref 12.0–15.0)
WBC ADJUSTED: 5.1 10*9/L (ref 4.5–11.0)

## 2019-01-12 LAB — MAGNESIUM: Magnesium:MCnc:Pt:Ser/Plas:Qn:: 1.7

## 2019-01-12 LAB — MEAN CORPUSCULAR VOLUME: Lab: 84.6

## 2019-01-12 LAB — BASIC METABOLIC PANEL
ANION GAP: 10 mmol/L (ref 7–15)
BLOOD UREA NITROGEN: 12 mg/dL (ref 7–21)
CALCIUM: 9.4 mg/dL (ref 8.5–10.2)
CHLORIDE: 98 mmol/L (ref 98–107)
CO2: 25 mmol/L (ref 22.0–30.0)
CREATININE: 0.62 mg/dL — ABNORMAL LOW (ref 0.70–1.30)
EGFR CKD-EPI NON-AA MALE: >90 mL/min/{1.73_m2} (ref >=60)
GLUCOSE RANDOM: 134 mg/dL (ref 70–179)
POTASSIUM: 4 mmol/L (ref 3.5–5.0)

## 2019-01-12 LAB — GLUCOSE RANDOM: Glucose:MCnc:Pt:Ser/Plas:Qn:: 134

## 2019-01-12 LAB — SMEAR REVIEW: Lab: 0

## 2019-01-12 MED FILL — BIKTARVY 50 MG-200 MG-25 MG TABLET: ORAL | 30 days supply | Qty: 30 | Fill #0

## 2019-01-12 MED FILL — BIKTARVY 50 MG-200 MG-25 MG TABLET: 30 days supply | Qty: 30 | Fill #0 | Status: AC

## 2019-01-12 NOTE — Unmapped (Signed)
Acute Inpatient Rehab referral has been received and patient is currently under review.     Due to the shared treatment spaces inherent to inpatient rehabilitation, [place] [facility] requires that a COVID-19 test is completed during the current hospital stay prior to admission to AIR.      Please call admissions office with any pertinent updates. Thank you.      Janit Pagan, OTR/L   [facility]  Inpatient Coordinator  Office: [phone]    3:51 PM 01/12/2019

## 2019-01-12 NOTE — Unmapped (Signed)
Pt VSS. Alert and oriented x4. WOCN placed new dressing on left axillary and left foot. Pt denies any pain at this time. Foley in place. Pt had BM today. Continue to monitor closely. Call bell within reach. Bed low and locked position.   Problem: Adult Inpatient Plan of Care  Goal: Plan of Care Review  Outcome: Progressing  Goal: Patient-Specific Goal (Individualization)  Outcome: Progressing  Goal: Absence of Hospital-Acquired Illness or Injury  Outcome: Progressing  Goal: Optimal Comfort and Wellbeing  Outcome: Progressing  Goal: Readiness for Transition of Care  Outcome: Progressing  Goal: Rounds/Family Conference  Outcome: Progressing     Problem: Infection  Goal: Infection Symptom Resolution  Outcome: Progressing     Problem: Fall Injury Risk  Goal: Absence of Fall and Fall-Related Injury  Outcome: Progressing     Problem: Self-Care Deficit  Goal: Improved Ability to Complete Activities of Daily Living  Outcome: Progressing     Problem: Wound  Goal: Optimal Wound Healing  Outcome: Progressing     Problem: Skin Injury Risk Increased  Goal: Skin Health and Integrity  Outcome: Progressing

## 2019-01-12 NOTE — Unmapped (Signed)
WOCN Consult Services                                                                 Wound Evaluation     Reason for Consult:   - Initial  - Wound    Problem List:   Principal Problem:    Gangrene (CMS-HCC)  Active Problems:    Thrush    COVID-19 virus infection    History of dental problems    Lower extremity edema    HIV (human immunodeficiency virus infection) (CMS-HCC)    Bilateral leg weakness    Urinary retention with bladder stretch injury    Assessment: Don [name]??is a 43 y.o.??male??with??previously untreated HIV/AIDS??(CD4 142 on admission) who initially presented with dry gangrene to the 2nd and 3rd digits of L hand and pain/weakness/discoloration over his lower extremities, subsequently found to be COVID??+ without respiratory difficulties (now off precautions), hospital course complicated by cauda equina syndrome leading to LE paralysis with urinary retention, found to have patchy hyperintensities in the spine and brain on MRI c/w transverse myelitis. Workup notable for Toxoplasma and EBV in his CSF, as well as CMV+ low level in the peripheral blood but negative in CSF. He was most recently found to have significant diffuse??lymphadenopathy on PET scan concerning for lymphoma and is now s/p left axillary excisional lymph node biopsy on 9/10, which was negative for malignancy and organisms.     Consult received for biopsy left foot not healing well after 1 month. Per MD note, biopsy was done on dorsum of left foot; Note states that scab from his biopsy site came off and started bleeding and does not appear to be healing well.    Pt also had left underarm biopsy which today appears healed. It was well cleansed, steristrips which were hanging loose were removed and a dry dressing was applied over the site.  ??  Pt has a small open area on the outer dorsal foot which according to chart is an old nonhealing biopsy site which began bleeding when top was removed. It is approximately 0.5cmx0.3cmx0.1cm with a pink wound bed. It is not bleeding at this time and does not appear infected.  Wound was cleansed with saline and a foam dressing was applied. Will order this dressing to be changed every other day      Lab Results   Component Value Date    WBC 5.1 01/12/2019    HGB 12.4 (L) 01/12/2019    HCT 36.2 (L) 01/12/2019    ESR 61 (H) 01/03/2019    CRP 18.2 (H) 01/03/2019    A1C 5.5 01/04/2019    GLUF 99 11/05/2010    GLU 134 01/12/2019    POCGLU 97 12/14/2018    ALBUMIN 3.8 01/08/2019    PROT 7.7 01/08/2019     Support Surface:   - Low Air Loss    Offloading:  Left: Heel Offloading Boot  Right: Heel Offloading Boot    Type Debridement Completed By WOCN:  N/A    Teaching:  - Elevation of extremity  - Wound care    WOCN Recommendations:   - See nursing orders for wound care instructions.  - Contact WOCN with questions, concerns, or wound deterioration.    Topical Therapy/Interventions:   - Foam  Recommended Consults:  - Not Applicable    WOCN Follow Up:  - Weekly    Plan of Care Discussed With:   - Patient  - RN Primary RN    Supplies Ordered: No    Workup Time:   60 minutes

## 2019-01-12 NOTE — Unmapped (Signed)
VSS.  No acute events.  Will continue to monitor.     Problem: Adult Inpatient Plan of Care  Goal: Optimal Comfort and Wellbeing  Intervention: Monitor Pain and Promote Comfort  Flowsheets (Taken 01/12/2019 0635)  Pain Management Interventions:   care clustered   position adjusted     Problem: Infection  Goal: Infection Symptom Resolution  Intervention: Prevent or Manage Infection  Flowsheets (Taken 01/12/2019 1610)  Infection Management: aseptic technique maintained

## 2019-01-12 NOTE — Unmapped (Signed)
Infectious Disease ([facility]K) Progress Note  I attest that I have reviewed the student note and that the components of the history of the present illness, the physical exam, and the assessment and plan documented were performed by me or were performed in my presence by the student where I verified the documentation and performed (or re-performed) the exam and medical decision making.      Edmon Crape, MD  PGY-1 - Internal  Medicine    Subjective and 24-hour Events:   No acute events overnight. Feels well today.    We again discussed the pros and cons of AIR here vs home care. Based on his needs and the recommendations of PM&R, we recommended that he stay here for intensive rehab. He states that while he'd ideally prefer to be home, he is very motivated to get stronger and understands why this might be a better option. He wants to do what the doctors think is best for his recovery.    Assessment & Plan:   Don [name]??is a 43 y.o.??male??with??previously untreated HIV/AIDS??(CD4 142 on admission) who initially presented with dry gangrene to the 2nd and 3rd digits of L hand and pain/weakness/discoloration over his lower extremities, subsequently found to be COVID??+ without respiratory difficulties (now off precautions), hospital course complicated by cauda equina syndrome leading to LE paralysis with urinary retention, found to have patchy hyperintensities in the spine on MRI c/w transverse myelitis. Also found to have enhancing lesion in the R corona radiata. Workup notable for Toxoplasma and EBV in his CSF, as well as CMV+ low level in the peripheral blood but negative in CSF. He was most recently found to have significant diffuse lymphadenopathy on PET scan concerning for lymphoma and is now s/p left axillary excisional lymph node biopsy on 9/10, which was negative for malignancy and organisms.  Repeat brain MRI 9/22 showed decreased size of lesion in the R corona radiata, increasing our suspicion for toxoplasmosis and lowering suspicion for CNS lymphoma.  ??  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 Lymph node biopsy negative for malignancy and organisms and showed inflammatory changes consistent with chronic HIV. Neurology consulted again on 9/17 and do not recommend follow up imaging at this point since the clinical picture has not changed. On 9/21 the patient noted chills going down his legs for the past few days, more often at night. He says these aren't very painful, but feel cold and strange. After discussing all options with the patient, we will move forward with transfer to AIR.  - Referral placed to AIR (he saw PM&R on 9/16 and 9/18)  - Continue sulfadiazine,??pyrimethamine, and leucovorin??started 8/24 for toxoplasmosis.  Anticipate a 4 to 6-week course and then transition to Bactrim after completion for PJP prophylaxis and toxo maintenance  - f/u arboviral antibody panel (collected 8/22)   - Consider transition to I&O caths when UOP consistently less than 2.4 liters.   ????????????????????  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  Patient was started on valacyclovir 1000 mg twice daily for 10 days. Will reassess after that to see if the ulceration resolves???may extend if need be. Urine GC/CT negative. On 9/20 the lesions seem to be improving.  - Continue valacyclovir 1000mg  bid (9/18-9/27)  Dry gangrene L hand    Etiology remains unclear. Dermatology suspected macro-occlusive process (embolic vs coagulopathic vs vascular). He states that he noticed color changes in his fingers following extraction of several teeth and the gangrene got progressively worse after that. TTE 8/20 did not show any vegetations indicative of endocarditis, though it is possible that there was a small vegetations too small to visualize. Bilateral UE arterial PVLs without any HD-significant findings and blood cultures obtained in the ED were negative. Rheumatology did not suspect vasculitis on their initial consult after admission. They were consulted this morning (9/16) for some abnormal rheumatologic/immunologic labs (ANA+, uptrending ESR/CRP) and again do not suspect vasculitis or an underlying rheumatologic disease. Vascular surgery consulted 8/21 and again on 9/13 and determined that it was likely not vascular in etiology. Hematology consulted 9/14 and while they acknowledge that he is a thrombotic risk due to his HIV and several concomitant infections, they do not recommend full anticoagulation due to negative hypercoagulability workup so far and no proof of thrombosis. Protein S activity was low with normal Protein C and elevated ATIII activity. Heme reconsulted 9/21 about coag study results, and they are not concerned about these values. Protein S deficiency is uncommon, difficult to diagnose due to wide normal  variation and sensitivity to inflammatory changes, and is typically associated with VTE and not arterial thrombosis which would better explain the gangrene. However, they do agree that he has early atherosclerotic disease and recommended baby aspirin and agree with starting a statin (see ASCVD risk, below).   - Outpatient follow-up in hand clinic   - bandage changes daily  ??  Discoloration bilateral lower extremities   Etiology remains unclear. Upon further close review with the infectious disease team, this does not appear to be gangrene. His DP and PT pulses are palpable and the feet are warm, which is reassuring. The plantar surface of the feet look good. 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 Given that the skin lesions seem restricted to the dorsum of the foot, this may just be a severe dermatophyte infection in a highly immunocompromised host. Itraconazole started 9/14. Per Pharmacy team, griseofulvin is a better option for his tinea pedis and onychomycosis, so we switched to that on 9/16. On 9/21 the scab from his biopsy site on L foot came off and started bleeding. This superficial wound does not appear to be healing well and so we will consult the wound nurse for dressing.  - wound nurse order placed 9/21  - continue griseofulvin 1000mg  suspension daily (6 month treatment course)  - Continue to monitor  ??  Tinea pedis   Trichophyton confirmed on biopsy.   - Griseofulvin as above  - Continue econazole cream to bilateral feet BID (started 12/11/2018)  ??  Untreated HIV infection; AIDS    Diagnosed in 2007 and was treatment naive on admission (had previously declined ART due to religious reasons).??His CD4 count was 142 and VL was 341,000 (8/20). He was noted to have oral thrush at [facility]. Started Tivacay/Descovy 8/26.??CD4 count 9/17 improved to 231.  - Plan for switch to [facility] on discharge. Dr. Dolores Frame has initiated process to coordinate outpatient follow-up and drug assistance enrollment.   - Will need bactrim prophylaxis for PJP (hold for now)   - Continue nystatin swishes for oral thrush    ASCVD Risk  Pooled Cohort 10-yr risk is 3%, however, HIV is  an independent risk factor for CVD which is not included in the risk score. Furthermore, he already has significant atherosclerotic disease in his lower extremities and dyslipidemia. Given this, we feel it is appropriate to start him on a statin. A1c 5.5.   - continue atorvastatin 20mg  qd  - continue aspirin 81mg  qd  ??  Eosinophilia and strongyloidiasis   He has had eosinophilia (900 to 1000) as far back as 10/2010, per record review; this remains stable at this time. His??stool O&P, GIPP were??negative.??Strongyloid Ab??positive on??8/21. S/p 2nd course of ivermectin completed 9/13.   ??  COVID-19 infection (resolved)  Asymptomatic. Completed 5 days remdesivir.??  Date of positive SARS-CoV-2 PCR: 12/09/2018 at OSH. Patient was 21 days from positive PCR on 9/9 and precautions discontinued.  ??      Objective:   Temp:  [37 ??C (98.6 ??F)-37.1 ??C (98.8 ??F)] 37 ??C (98.6 ??F)  Heart Rate:  [91-95] 91  Resp:  [14] 14  BP: (117-123)/(73-74) 123/73  SpO2:  [99 %-100 %] 100 %    General: No acute distress, alert, oriented, answers questions appropriately   HEENT: atraumatic, sclera anicteric   Heart: RRR, no M/R/G  Lungs: Clear to auscultation bilaterally, no crackles or wheezes, no use of accessory muscles  Abdomen: Normoactive bowel sounds, soft, non-tender, non-distended, no rebound/guarding  Extremities: Foot exam with bilateral thick blackened scale on toes with discoloration of dorsal feet, unchanged from prior exam.  L index finger with normal color, L middle finger bandaged  Neuro: Alert and oriented x 3.  0/5 strength in lower extremities.  Has sensation to light touch down to just above knees, no sensation to light touch below. Flicker of R 1st toe movement but not reproducible (pt says he was trying to do it but could not repeat it).  GU: defer today  Psych: Appropriate mood and affect      Labs/Studies: Labs and studies reviewed and pertinent information is addressed in the plan      Eustaquio Boyden - MS3

## 2019-01-12 NOTE — Unmapped (Signed)
Don [name] is doing well today, uneventful shift. No acute concerns, VSS. Pain well controlled with PO. Foley intact and care performed this morning. Took PO only once, was rather sleepy today. Dressing changed to finger with PO meds. Worked well with PT at the edge of bed. Will continue POC for dispo to AIR vs SNF.    Problem: Adult Inpatient Plan of Care  Goal: Plan of Care Review  Outcome: Progressing  Goal: Patient-Specific Goal (Individualization)  Outcome: Progressing  Goal: Absence of Hospital-Acquired Illness or Injury  Outcome: Progressing  Goal: Optimal Comfort and Wellbeing  Outcome: Progressing  Goal: Readiness for Transition of Care  Outcome: Progressing  Goal: Rounds/Family Conference  Outcome: Progressing     Problem: Infection  Goal: Infection Symptom Resolution  Outcome: Progressing     Problem: Fall Injury Risk  Goal: Absence of Fall and Fall-Related Injury  Outcome: Progressing     Problem: Self-Care Deficit  Goal: Improved Ability to Complete Activities of Daily Living  Outcome: Progressing     Problem: Wound  Goal: Optimal Wound Healing  Outcome: Progressing     Problem: Skin Injury Risk Increased  Goal: Skin Health and Integrity  Outcome: Progressing

## 2019-01-13 LAB — MAGNESIUM: Magnesium:MCnc:Pt:Ser/Plas:Qn:: 1.7

## 2019-01-13 NOTE — Unmapped (Signed)
Pt remains in the bed in no apparent distress or discomfort. He has been turned and repositioned in the bed for comfort. He has denied pain or discomfort. Left middle finger dressing changed this shift. No signs of infection but continues to have slowed healing. He continues to have bilat LE weakness. He is able to make most of his needs known.   Problem: Adult Inpatient Plan of Care  Goal: Plan of Care Review  Outcome: Progressing  Goal: Patient-Specific Goal (Individualization)  Outcome: Progressing  Goal: Absence of Hospital-Acquired Illness or Injury  Outcome: Progressing  Goal: Optimal Comfort and Wellbeing  Outcome: Progressing  Goal: Readiness for Transition of Care  Outcome: Progressing  Goal: Rounds/Family Conference  Outcome: Progressing     Problem: Infection  Goal: Infection Symptom Resolution  Outcome: Progressing     Problem: Fall Injury Risk  Goal: Absence of Fall and Fall-Related Injury  Outcome: Progressing     Problem: Self-Care Deficit  Goal: Improved Ability to Complete Activities of Daily Living  Outcome: Progressing     Problem: Wound  Goal: Optimal Wound Healing  Outcome: Progressing     Problem: Skin Injury Risk Increased  Goal: Skin Health and Integrity  Outcome: Progressing

## 2019-01-13 NOTE — Unmapped (Signed)
Infectious Disease (MDK) Progress Note    Subjective and 24-hour Events:   No acute events overnight. Feels well today.    Orders placed yesterday for AIR referral.     Assessment & Plan:   Don [name]??is a 43 y.o.??male??with??previously untreated HIV/AIDS??(CD4 142 on admission) who initially presented with dry gangrene to the 2nd and 3rd digits of L hand and pain/weakness/discoloration over his lower extremities, subsequently found to be COVID??+ without respiratory difficulties (now off precautions), hospital course complicated by cauda equina syndrome leading to LE paralysis with urinary retention, found to have patchy hyperintensities in the spine on MRI c/w transverse myelitis. Also found to have enhancing lesion in the R corona radiata. Workup notable for Toxoplasma and EBV in his CSF, as well as CMV+ low level in the peripheral blood but negative in CSF. He was most recently found to have significant diffuse lymphadenopathy on PET scan concerning for lymphoma and is now s/p left axillary excisional lymph node biopsy on 9/10, which was negative for malignancy and organisms.  Repeat brain MRI 9/22 showed decreased size of lesion in the R corona radiata, increasing our suspicion for toxoplasmosis and lowering suspicion for CNS lymphoma.  ??  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 Lymph node biopsy negative for malignancy and organisms and showed inflammatory changes consistent with chronic HIV. Neurology consulted again on 9/17 and do not recommend follow up imaging at this point since the clinical picture has not changed. On 9/21 the patient noted chills going down his legs for the past few days, more often at night. He says these aren't very painful, but feel cold and strange. After discussing all options with the patient, we will move forward with transfer to AIR.   - Referral placed to AIR 9/22, they require a negative COVID test before transfer but he is still <60 days from his recent positive test, so reswabbing him would likely give a false positive. They will let us know how they want to proceed.  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  Anticipate a 4 to 6-week course and then transition to Bactrim after completion for PJP prophylaxis and toxo maintenance  - f/u arboviral antibody panel (collected 8/22)   - Speak to nursing staff about removing Foley  ????????????????????  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  Patient was started on valacyclovir 1000 mg twice daily for 10 days. Will reassess after that to see if the ulceration resolves???may extend if need be. Urine GC/CT negative. On 9/20 the lesions seem to be improving.  - Continue valacyclovir 1000mg  bid (9/18-9/27)    Dry gangrene L hand    Etiology remains unclear. Dermatology suspected macro-occlusive process (embolic vs coagulopathic vs vascular). He states that he noticed color changes in his fingers following extraction of several teeth and the gangrene got progressively worse after that. TTE 8/20 did not show any vegetations indicative of endocarditis, though it is possible that there was a small vegetations too small to visualize. Bilateral UE arterial PVLs without any HD-significant findings and blood cultures obtained in the ED were negative. Rheumatology did not suspect vasculitis on their initial consult after admission. They were consulted this morning (9/16) for some abnormal rheumatologic/immunologic labs (ANA+, uptrending  ESR/CRP) and again do not suspect vasculitis or an underlying rheumatologic disease. Vascular surgery consulted 8/21 and again on 9/13 and determined that it was likely not vascular in etiology. Hematology consulted 9/14 and while they acknowledge that he is a thrombotic risk due to his HIV and several concomitant infections, they do not recommend full anticoagulation due to negative hypercoagulability workup so far and no proof of thrombosis. Protein S activity was low with normal Protein C and elevated ATIII activity. Heme reconsulted 9/21 about coag study results, and they are not concerned about these values. Protein S deficiency is uncommon, difficult to diagnose due to wide normal  variation and sensitivity to inflammatory changes, and is typically associated with VTE and not arterial thrombosis which would better explain the gangrene. However, they do agree that he has early atherosclerotic disease and recommended baby aspirin and agree with starting a statin (see ASCVD risk, below).   - Outpatient follow-up in hand clinic   - bandage changes daily  ??  Discoloration bilateral lower extremities   Etiology remains unclear. Upon further close review with the infectious disease team, this does not appear to be gangrene. His DP and PT pulses are palpable and the feet are warm, which is reassuring. The plantar surface of the feet look good. 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 Given that the skin lesions seem restricted to the dorsum of the foot, this may just be a severe dermatophyte infection in a highly immunocompromised host. Itraconazole started 9/14. Per Pharmacy team, griseofulvin is a better option for his tinea pedis and onychomycosis, so we switched to that on 9/16. On 9/21 the scab from his biopsy site on L foot came off and started bleeding. This superficial wound does not appear to be healing well and so we will consult the wound nurse for dressing.  - wound nurse order placed 9/21  - continue griseofulvin 1000mg  suspension daily (6 month treatment course)  - Continue to monitor  ??  Tinea pedis   Trichophyton confirmed on biopsy.   - Griseofulvin as above  - Continue econazole cream to bilateral feet BID (started 12/11/2018)  ??  Untreated HIV infection; AIDS    Diagnosed in 2007 and was treatment naive on admission (had previously declined ART due to religious reasons).??His CD4 count was 142 and VL was 341,000 (8/20). He was noted to have oral thrush at [facility]. Started Tivacay/Descovy 8/26.??CD4 count 9/17 improved to 231.  - Plan for switch to [facility] on discharge. Dr. Dolores Frame has initiated process to coordinate outpatient follow-up and drug assistance enrollment.   - Will need bactrim prophylaxis for PJP (hold for now)   - Continue nystatin swishes for oral thrush    ASCVD Risk  Pooled Cohort 10-yr risk is 3%, however, HIV is an independent risk factor for CVD which is not included in the risk score. Furthermore, he already has significant atherosclerotic disease in his lower extremities and dyslipidemia. Given this, we feel it is appropriate to start him on a statin. A1c 5.5.   - continue atorvastatin 20mg  qd  - continue aspirin 81mg  qd  ??  Eosinophilia and strongyloidiasis   He has had eosinophilia (900 to 1000) as far back as 10/2010, per record review; this remains stable at this time. His??stool O&P, GIPP were??negative.??Strongyloid Ab??positive on??8/21. S/p 2nd course of ivermectin completed 9/13.   ??  COVID-19 infection (resolved)  Asymptomatic. Completed  5 days remdesivir.??  Date of positive SARS-CoV-2 PCR: 12/09/2018 at OSH. Patient was 21 days from positive PCR on 9/9 and precautions discontinued.  ??      Objective:   Temp:  [36.4 ??C (97.5 ??F)-37 ??C (98.6 ??F)] 36.4 ??C (97.5 ??F)  Heart Rate:  [90-96] 96  Resp:  [16-18] 16  BP: (122-125)/(73-75) 122/74  SpO2:  [99 %] 99 %    General: No acute distress, alert, oriented, answers questions appropriately   HEENT: atraumatic, sclera anicteric   Heart: RRR, no M/R/G  Lungs: Clear to auscultation bilaterally, no crackles or wheezes, no use of accessory muscles  Abdomen: Normoactive bowel sounds, soft, non-tender, non-distended, no rebound/guarding  Extremities: Foot exam with bilateral thick blackened scale on toes with discoloration of dorsal feet, unchanged from prior exam.  L index finger with normal color, L middle finger bandaged  Neuro: defer today  GU: defer today  Psych: Appropriate mood and affect      Labs/Studies: Labs and studies reviewed and pertinent information is addressed in the plan      Eustaquio Boyden - MS3    I attest that I have reviewed the student note and that the components of the history of the present illness, the physical exam, and the assessment and plan documented were performed by me or were performed in my presence by the student where I verified the documentation and performed (or re-performed) the exam and medical decision making.      Edmon Crape, MD  PGY-1 - Internal  Medicine

## 2019-01-14 LAB — BASIC METABOLIC PANEL
ANION GAP: 14 mmol/L (ref 7–15)
BUN / CREAT RATIO: 18
CALCIUM: 9 mg/dL (ref 8.5–10.2)
CHLORIDE: 100 mmol/L (ref 98–107)
CO2: 24 mmol/L (ref 22.0–30.0)
CREATININE: 0.88 mg/dL (ref 0.70–1.30)
EGFR CKD-EPI AA MALE: >90 mL/min/{1.73_m2} (ref >=60)
EGFR CKD-EPI NON-AA MALE: >90 mL/min/{1.73_m2} (ref >=60)
GLUCOSE RANDOM: 129 mg/dL (ref 70–179)
POTASSIUM: 4.2 mmol/L (ref 3.5–5.0)
SODIUM: 138 mmol/L (ref 135–145)

## 2019-01-14 LAB — MEAN CORPUSCULAR HEMOGLOBIN: Lab: 28.2

## 2019-01-14 LAB — CBC W/ AUTO DIFF
BASOPHILS RELATIVE PERCENT: 1.4 %
EOSINOPHILS ABSOLUTE COUNT: 0.7 10*9/L — ABNORMAL HIGH (ref 0.0–0.4)
EOSINOPHILS RELATIVE PERCENT: 13.4 %
HEMATOCRIT: 37.2 % — ABNORMAL LOW (ref 41.0–53.0)
HEMOGLOBIN: 12.4 g/dL — ABNORMAL LOW (ref 13.5–17.5)
LARGE UNSTAINED CELLS: 4 % (ref 0–4)
LYMPHOCYTES RELATIVE PERCENT: 32.3 %
MEAN CORPUSCULAR HEMOGLOBIN CONC: 33.3 g/dL (ref 31.0–37.0)
MEAN CORPUSCULAR HEMOGLOBIN: 28.2 pg (ref 26.0–34.0)
MEAN CORPUSCULAR VOLUME: 84.8 fL (ref 80.0–100.0)
MEAN PLATELET VOLUME: 7.5 fL (ref 7.0–10.0)
MONOCYTES ABSOLUTE COUNT: 0.5 10*9/L (ref 0.2–0.8)
MONOCYTES RELATIVE PERCENT: 9.8 %
NEUTROPHILS ABSOLUTE COUNT: 2 10*9/L (ref 2.0–7.5)
NEUTROPHILS RELATIVE PERCENT: 39.4 %
RED BLOOD CELL COUNT: 4.39 10*12/L — ABNORMAL LOW (ref 4.50–5.90)
RED CELL DISTRIBUTION WIDTH: 16.1 % — ABNORMAL HIGH (ref 12.0–15.0)

## 2019-01-14 LAB — MAGNESIUM: Magnesium:MCnc:Pt:Ser/Plas:Qn:: 1.7

## 2019-01-14 LAB — CREATININE: Creatinine:MCnc:Pt:Ser/Plas:Qn:: 0.88

## 2019-01-14 NOTE — Unmapped (Signed)
Infectious Disease ([facility]) Progress Note    I attest that I have reviewed the student note and that the components of the history of the present illness, the physical exam, and the assessment and plan documented were performed by me or were performed in my presence by the student where I verified the documentation and performed (or re-performed) the exam and medical decision making.    Don [name], PGY2  Internal Medicine      Subjective and 24-hour Events:   No acute events overnight. Feels well today. Pt reflected for a while this morning on the course of his disease, what the future might hold, and how his faith in God and the continuous support of his family will help him through it.    [facility] contacted yesterday to discuss next steps regarding their requirement for a negative COVID test. Since Don [name] only about 6 weeks from his positive test, a repeat test would likely be positive again even though he is no longer infectious. They will get recs from the medical director and get back to [place].    Assessment & Plan:   Don [name]??is a 43 y.o.??male??with??previously untreated HIV/AIDS??(CD4 142 on admission) who initially presented with dry gangrene to the 2nd and 3rd digits of L hand and pain/weakness/discoloration over his lower extremities, subsequently found to be COVID??+ without respiratory difficulties (now off precautions), hospital course complicated by cauda equina syndrome leading to LE paralysis with urinary retention, found to have patchy hyperintensities in the spine on MRI c/w transverse myelitis. Also found to have enhancing lesion in the R corona radiata. Workup notable for Toxoplasma and EBV in his CSF, as well as CMV+ low level in the peripheral blood but negative in CSF. He was most recently found to have significant diffuse lymphadenopathy on PET scan concerning for lymphoma and is now s/p left axillary excisional lymph node biopsy on 9/10, which was negative for malignancy and organisms. Repeat brain MRI 9/22 showed decreased size of lesion in the R corona radiata, increasing our suspicion for toxoplasmosis and lowering suspicion for CNS lymphoma.  ??  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 Lymph node biopsy negative for malignancy and organisms and showed inflammatory changes consistent with chronic HIV. Neurology consulted again on 9/17 and do not recommend follow up imaging at this point since the clinical picture has not changed. On 9/21 the patient noted chills going down his legs for the past few days, more often at night. He says these aren't very painful, but feel cold and strange. After discussing all options with the patient, we will move forward with transfer to [facility].   - Referral placed to [facility] 9/22, they require a negative COVID test before transfer but he is still <60 days from his recent positive test, so reswabbing him would likely give a false positive. [facility] contacted yesterday to discuss next steps - they will let us know how they want to proceed.  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  Anticipate a 4 to 6-week course and then transition to Bactrim after completion for PJP prophylaxis and toxo maintenance  - f/u arboviral antibody panel (collected 8/22)   ????????????????????  Penile Ulcers - HSV2  Penile lesions identified by nurse  on 9/16. 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  Patient was started on valacyclovir 1000 mg twice daily for 10 days. Will reassess after that to see if the ulceration resolves???may extend if need be. Urine GC/CT negative. On 9/20 the lesions seemed to be improving, and on 9/24 they were almost completely resolved.  - Continue valacyclovir 1000mg  bid (9/18-9/27)    Dry gangrene L hand    Etiology remains unclear. Dermatology suspected macro-occlusive process (embolic vs coagulopathic vs vascular). He states that he noticed color changes in his fingers following extraction of several teeth and the gangrene got progressively worse after that. TTE 8/20 did not show any vegetations indicative of endocarditis, though it is possible that there was a small vegetations too small to visualize. Bilateral UE arterial PVLs without any HD-significant findings and blood cultures obtained in the ED were negative. Rheumatology did not suspect vasculitis on their initial consult after admission. They were consulted this morning (9/16) for some abnormal rheumatologic/immunologic labs (ANA+, uptrending ESR/CRP) and again do not suspect vasculitis or an underlying rheumatologic disease. Vascular surgery consulted 8/21 and again on 9/13 and determined that it was likely not vascular in etiology. Hematology consulted 9/14 and while they acknowledge that he is a thrombotic risk due to his HIV and several concomitant infections, they do not recommend full anticoagulation due to negative hypercoagulability workup so far and no proof of thrombosis. Protein S activity was low with normal Protein C and elevated ATIII activity. Heme reconsulted 9/21 about coag study results, and they are not concerned about these values. Protein S deficiency is uncommon, difficult to diagnose due to wide normal  variation and sensitivity to inflammatory changes, and is typically associated with VTE and not arterial thrombosis which would better explain the gangrene. However, they do agree that he has early atherosclerotic disease and recommended baby aspirin and agree with starting a statin (see ASCVD risk, below).   - Outpatient follow-up in hand clinic   - bandage changes daily  ??  Discoloration bilateral lower extremities   Etiology remains unclear. Upon further close review with the infectious disease team, this does not appear to be gangrene. His DP and PT pulses are palpable and the feet are warm, which is reassuring. The plantar surface of the feet look good. 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 Given that the skin lesions seem restricted to the dorsum of the foot, this may just be a severe dermatophyte infection in a highly immunocompromised host. Itraconazole started 9/14. Per Pharmacy team, griseofulvin is a better option for his tinea pedis and onychomycosis, so we switched to that on 9/16. On 9/21 the scab from his biopsy site on L foot came off and started bleeding. This superficial wound does not appear to be healing well and so we will consult the wound nurse for dressing.  - wound nurse order placed 9/21  - continue griseofulvin 1000mg  suspension daily (6 month treatment course)  - Continue to monitor  ??  Tinea pedis   Trichophyton confirmed on biopsy.   - Griseofulvin as above  - Continue econazole cream to bilateral feet BID (started 12/11/2018)  ??  Untreated HIV infection; AIDS    Diagnosed in 2007 and was treatment naive on admission (had previously declined ART due to religious reasons).??His CD4 count was 142 and VL was 341,000 (8/20). He was noted to have oral thrush at [facility].  Started Tivacay/Descovy 8/26.??CD4 count 9/17 improved to 231.  - Plan for switch to [facility] on discharge. Dr. Dolores Frame has initiated process to coordinate outpatient follow-up and drug assistance enrollment.   - Will need bactrim prophylaxis for PJP (hold for now)   - Continue nystatin swishes for oral thrush    ASCVD Risk  Pooled Cohort 10-yr risk is 3%, however, HIV is an independent risk factor for CVD which is not included in the risk score. Furthermore, he already has significant atherosclerotic disease in his lower extremities and dyslipidemia. Given this, we feel it is appropriate to start him on a statin. A1c 5.5.   - continue atorvastatin 20mg  qd  - continue aspirin 81mg  qd  ??  Eosinophilia and strongyloidiasis   He has had eosinophilia (900 to 1000) as far back as 10/2010, per record review; this remains stable at this time. His??stool O&P, GIPP were??negative.??Strongyloid Ab??positive on??8/21. S/p 2nd course of ivermectin completed 9/13.   ??  COVID-19 infection (resolved)  Asymptomatic. Completed 5 days remdesivir.??  Date of positive SARS-CoV-2 PCR: 12/09/2018 at OSH. Patient was 21 days from positive PCR on 9/9 and precautions discontinued.  ??      Objective:   Temp:  [36.1 ??C (97 ??F)-37 ??C (98.6 ??F)] 37 ??C (98.6 ??F)  Heart Rate:  [96-110] 101  Resp:  [16-18] 17  BP: (122-129)/(74-79) 129/79  SpO2:  [99 %-100 %] 99 %    General: No acute distress, alert, oriented, answers questions appropriately   HEENT: atraumatic, sclera anicteric   Heart: RRR, no M/R/G  Lungs: Clear to auscultation bilaterally, no crackles or wheezes, no use of accessory muscles  Abdomen: Normoactive bowel sounds, soft, non-tender, non-distended, no rebound/guarding  Extremities: Foot exam with bilateral thick blackened scale on toes with discoloration of dorsal feet, unchanged from prior exam.  L index finger with normal color, L middle finger bandaged  Neuro: 0/5 strength in bilateral LE. Has sensation to light touch down to just above knees, no sensation to light touch below, unchanged from prior.  GU: Penile ulcerations almost completely resolved. Foley in place.  Psych: Appropriate mood and affect      Labs/Studies: Labs and studies reviewed and pertinent information is addressed in the plan      Don [name] - MS3

## 2019-01-14 NOTE — Unmapped (Signed)
Vital signs are stable and patient is afebrile. Frequent repositioning performed. Kerrafoam to the sacrum. Patient denies pain. Care clustered to promote a regular sleep pattern. All medications administered per MD order and patient tolerating we.. Foley in place and is patent, see flow sheets for details. Wife at the bedside last night, denies any questions or concerns.     Problem: Adult Inpatient Plan of Care  Goal: Plan of Care Review  Outcome: Progressing  Goal: Patient-Specific Goal (Individualization)  Outcome: Progressing  Goal: Absence of Hospital-Acquired Illness or Injury  Outcome: Progressing  Intervention: Identify and Manage Fall Risk  Flowsheets (Taken 01/14/2019 0451)  Safety Interventions:   fall reduction program maintained   infection management   low bed  Intervention: Prevent Skin Injury  Flowsheets (Taken 01/14/2019 0451)  Pressure Reduction Techniques:   frequent weight shift encouraged   heels elevated off bed   pressure points protected  Intervention: Prevent VTE (venous thromboembolism)  Flowsheets (Taken 01/14/2019 0451)  VTE Prevention/Management: fluids promoted  Intervention: Prevent Infection  Flowsheets (Taken 01/14/2019 0451)  Infection Prevention:   handwashing promoted   personal protective equipment utilized   rest/sleep promoted   single patient room provided   visitors restricted/screened  Goal: Optimal Comfort and Wellbeing  Outcome: Progressing  Intervention: Monitor Pain and Promote Comfort  Flowsheets (Taken 01/14/2019 0451)  Pain Management Interventions:   care clustered   pain management plan reviewed with patient/caregiver   quiet environment facilitated   relaxation techniques promoted  Intervention: Provide Person-Centered Care  Flowsheets (Taken 01/14/2019 0451)  Trust Relationship/Rapport: care explained  Goal: Readiness for Transition of Care  Outcome: Progressing  Intervention: Mutually Develop Transition Plan  Flowsheets (Taken 01/14/2019 0451)  Concerns to be Addressed: basic needs   decision making  Goal: Rounds/Family Conference  Outcome: Progressing     Problem: Infection  Goal: Infection Symptom Resolution  Outcome: Progressing  Intervention: Prevent or Manage Infection  Flowsheets (Taken 01/14/2019 0451)  Infection Management: aseptic technique maintained     Problem: Fall Injury Risk  Goal: Absence of Fall and Fall-Related Injury  Outcome: Progressing  Intervention: Identify and Manage Contributors to Fall Injury Risk  Flowsheets (Taken 01/14/2019 0451)  Medication Review/Management: medications reviewed  Self-Care Promotion:   BADL personal objects within reach   BADL personal routines maintained  Intervention: Promote Injury-Free Environment  Flowsheets (Taken 01/14/2019 0451)  Safety Interventions:   fall reduction program maintained   infection management   low bed     Problem: Self-Care Deficit  Goal: Improved Ability to Complete Activities of Daily Living  Outcome: Progressing  Intervention: Promote Activity and Functional Independence  Flowsheets (Taken 01/14/2019 0451)  Activity Assistance Provided: assistance, 1 person  Self-Care Promotion:   BADL personal objects within reach   BADL personal routines maintained     Problem: Wound  Goal: Optimal Wound Healing  Outcome: Progressing  Intervention: Promote Effective Wound Healing  Flowsheets (Taken 01/14/2019 0451)  Oral Nutrition Promotion: calorie dense foods provided  Pain Management Interventions:   care clustered   pain management plan reviewed with patient/caregiver   quiet environment facilitated   relaxation techniques promoted  Sleep/Rest Enhancement:   awakenings minimized   regular sleep/rest pattern promoted   relaxation techniques promoted     Problem: Skin Injury Risk Increased  Goal: Skin Health and Integrity  Outcome: Progressing  Intervention: Optimize Skin Protection  Flowsheets (Taken 01/14/2019 0451)  Pressure Reduction Techniques:   frequent weight shift encouraged   heels elevated off bed  pressure points protected  Pressure Reduction Devices: pressure-redistributing mattress utilized  Intervention: Promote and Optimize Oral Intake  Flowsheets (Taken 01/14/2019 0451)  Oral Nutrition Promotion: calorie dense foods provided

## 2019-01-15 NOTE — Unmapped (Signed)
Pt oriented x 4, VSS and all medications passed as ordered per MAR. Pt had minor c/o pain but requested no interventions. Urology came and reinserted a foley this shift after pt unable to void. Pt received bath this shift. Wound care performed to L hand, dressing changed on L foot. No acute events to report overnight. Will continue to monitor.  Problem: Adult Inpatient Plan of Care  Goal: Plan of Care Review  Outcome: Progressing  Goal: Patient-Specific Goal (Individualization)  Outcome: Progressing  Goal: Absence of Hospital-Acquired Illness or Injury  Outcome: Progressing  Goal: Optimal Comfort and Wellbeing  Outcome: Progressing  Goal: Readiness for Transition of Care  Outcome: Progressing  Goal: Rounds/Family Conference  Outcome: Progressing     Problem: Infection  Goal: Infection Symptom Resolution  Outcome: Progressing     Problem: Fall Injury Risk  Goal: Absence of Fall and Fall-Related Injury  Outcome: Progressing     Problem: Self-Care Deficit  Goal: Improved Ability to Complete Activities of Daily Living  Outcome: Progressing     Problem: Wound  Goal: Optimal Wound Healing  Outcome: Progressing     Problem: Skin Injury Risk Increased  Goal: Skin Health and Integrity  Outcome: Progressing

## 2019-01-15 NOTE — Unmapped (Signed)
Don [name] had an uneventful shift. VSS. Remains in bed, and is able to reposition himself. Minimal pain, controlled by PO meds. Offloading boots remain on. Dressing changed to L hand with minimal pain. No acute concerns, decent PO intake and UOP. No stool as of yet. Will continue  POC.    Problem: Adult Inpatient Plan of Care  Goal: Plan of Care Review  Outcome: Progressing  Goal: Patient-Specific Goal (Individualization)  Outcome: Progressing  Goal: Absence of Hospital-Acquired Illness or Injury  Outcome: Progressing  Goal: Optimal Comfort and Wellbeing  Outcome: Progressing  Goal: Readiness for Transition of Care  Outcome: Progressing  Goal: Rounds/Family Conference  Outcome: Progressing     Problem: Infection  Goal: Infection Symptom Resolution  Outcome: Progressing     Problem: Fall Injury Risk  Goal: Absence of Fall and Fall-Related Injury  Outcome: Progressing     Problem: Self-Care Deficit  Goal: Improved Ability to Complete Activities of Daily Living  Outcome: Progressing     Problem: Wound  Goal: Optimal Wound Healing  Outcome: Progressing     Problem: Skin Injury Risk Increased  Goal: Skin Health and Integrity  Outcome: Progressing

## 2019-01-15 NOTE — Unmapped (Signed)
Pt. Resting this shift watching tv. Pt. Finally ate dinner once his wife brought food. Pt.s foley d/c mid morning. Pt. Has not voided since. I&O pt. Almost at 400 in his bladder. Will rescan and I &O as needed. Pt. Repositions slightly. Pt. Will bathed this evening and have dressing changed to his left hand and ointment to his feet. No c/o pain.

## 2019-01-15 NOTE — Unmapped (Signed)
Foley Catheter Placement Note    Indications: Urinary retention in setting of known transverse myelitis and Cauda equina.     Surgeon:   Cherlyn Labella, MD  PGY2 Urologic Surgery    Assistants: None    Procedure Details   Patient was placed in the supine position, prepped with Betadine and draped in the usual sterile fashion.  We injected lidocaine jelly per urethra prior to the procedure.  We then inserted a 16 [place] coude catheter per urethra which easily passed into the bladder without any resistance at the prostatic urethra.  We achieved return of clear yellow urine and then proceeded to insert 10 mL of sterile water into the Foley balloon.  The catheter was attached to a drainage bag and secured with a StatLock.  Placement of the catheter had return of greater than 400 mL of clear yellow urine.                 Complications: None; patient tolerated the procedure well.

## 2019-01-15 NOTE — Unmapped (Signed)
Infectious Disease ([facility]) Progress Note    I attest that I have reviewed the student note and that the components of the history of the present illness, the physical exam, and the assessment and plan documented were performed by me or were performed in my presence by the student where I verified the documentation and performed (or re-performed) the exam and medical decision making.      Edmon Crape, MD  PGY-1 - Internal  Medicine    Subjective and 24-hour Events:   No acute events overnight. Feels well today. Failed a spontaneous voiding trial yesterday and is disappointed about that. Would like to start rehab.    Assessment & Plan:   Don [name]??is a 43 y.o.??male??with??previously untreated HIV/AIDS??(CD4 142 on admission) who initially presented with dry gangrene to the 2nd and 3rd digits of L hand and pain/weakness/discoloration over his lower extremities, subsequently found to be COVID??+ without respiratory difficulties (now off precautions), hospital course complicated by cauda equina syndrome leading to LE paralysis with urinary retention, found to have patchy hyperintensities in the spine on MRI c/w transverse myelitis. Also found to have enhancing lesion in the R corona radiata. Workup notable for Toxoplasma and EBV in his CSF, as well as CMV+ low level in the peripheral blood but negative in CSF. He was most recently found to have significant diffuse lymphadenopathy on PET scan concerning for lymphoma and is now s/p left axillary excisional lymph node biopsy on 9/10, which was negative for malignancy and organisms.  Repeat brain MRI 9/22 showed decreased size of lesion in the R corona radiata, increasing our suspicion for toxoplasmosis and lowering suspicion for CNS lymphoma.  ??  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 Lymph node biopsy negative for malignancy and organisms and showed inflammatory changes consistent with chronic HIV. Neurology consulted again on 9/17 and do not recommend follow up imaging at this point since the clinical picture has not changed. On 9/21 the patient noted chills going down his legs for the past few days, more often at night. He says these aren't very painful, but feel cold and strange. After discussing all options with the patient, we will move forward with transfer to [facility] (order placed 9/22). A spontaneous voiding trial was done on 9/24 and pt was unable to void on his own, so urology replaced the Foley.  - Transfer pt to [facility] as soon as they are able to take him (we were informed today that he will NOT need a negative COVID test before transfer)  - Continue sulfadiazine,??pyrimethamine, and leucovorin??started 8/24 for toxoplasmosis.  Anticipate a 4 to 6-week course and then transition to Bactrim after completion for PJP prophylaxis and toxo maintenance  - f/u arboviral antibody panel (collected 8/22)   ????????????????????  Penile Ulcers - HSV2  Penile lesions identified by nurse on 9/16. 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  Patient was started on valacyclovir 1000 mg twice daily for 10 days. Will reassess after that to see if the ulceration resolves???may extend if need be. Urine GC/CT negative. On 9/20 the lesions seemed to be improving, and on 9/24 they were almost completely resolved.  - Continue valacyclovir 1000mg  bid (9/18-9/27)    Dry gangrene L hand    Etiology remains unclear. Dermatology suspected  macro-occlusive process (embolic vs coagulopathic vs vascular). He states that he noticed color changes in his fingers following extraction of several teeth and the gangrene got progressively worse after that. TTE 8/20 did not show any vegetations indicative of endocarditis, though it is possible that there was a small vegetations too small to visualize. Bilateral UE arterial PVLs without any HD-significant findings and blood cultures obtained in the ED were negative. Rheumatology did not suspect vasculitis on their initial consult after admission. They were consulted this morning (9/16) for some abnormal rheumatologic/immunologic labs (ANA+, uptrending ESR/CRP) and again do not suspect vasculitis or an underlying rheumatologic disease. Vascular surgery consulted 8/21 and again on 9/13 and determined that it was likely not vascular in etiology. Hematology consulted 9/14 and while they acknowledge that he is a thrombotic risk due to his HIV and several concomitant infections, they do not recommend full anticoagulation due to negative hypercoagulability workup so far and no proof of thrombosis. Protein S activity was low with normal Protein C and elevated ATIII activity. Heme reconsulted 9/21 about coag study results, and they are not concerned about these values. Protein S deficiency is uncommon, difficult to diagnose due to wide normal  variation and sensitivity to inflammatory changes, and is typically associated with VTE and not arterial thrombosis which would better explain the gangrene. However, they do agree that he has early atherosclerotic disease and recommended baby aspirin and agree with starting a statin (see ASCVD risk, below).   - Outpatient follow-up in hand clinic   - bandage changes daily  ??  Discoloration bilateral lower extremities   Etiology remains unclear. Upon further close review with the infectious disease team, this does not appear to be gangrene. His DP and PT pulses are palpable and the feet are warm, which is reassuring. The plantar surface of the feet look good. 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 Given that the skin lesions seem restricted to the dorsum of the foot, this may just be a severe dermatophyte infection in a highly immunocompromised host. Itraconazole started 9/14. Per Pharmacy team, griseofulvin is a better option for his tinea pedis and onychomycosis, so we switched to that on 9/16. On 9/21 the scab from his biopsy site on L foot came off and started bleeding. This superficial wound does not appear to be healing well and so we will consult the wound nurse for dressing.  - wound nurse order placed 9/21  - continue griseofulvin 1000mg  suspension daily (6 month treatment course)  - Continue to monitor  ??  Tinea pedis   Trichophyton confirmed on biopsy.   - Griseofulvin as above  - Continue econazole cream to bilateral feet BID (started 12/11/2018)  ??  Untreated HIV infection; AIDS    Diagnosed in 2007 and was treatment naive on admission (had previously declined ART due to religious reasons).??His CD4 count was 142 and VL was 341,000 (8/20). He was noted to have oral thrush at [facility]. Started Tivacay/Descovy 8/26.??CD4 count 9/17 improved to 231.  - Plan for switch to [facility] on discharge. Dr. Dolores Frame has initiated process to coordinate outpatient follow-up and drug assistance enrollment.   - Will need bactrim prophylaxis for PJP (hold for now)   - Continue nystatin swishes for oral thrush    ASCVD Risk  Pooled Cohort 10-yr risk is 3%, however, HIV is an independent risk factor for CVD which is not included in the  risk score. Furthermore, he already has significant atherosclerotic disease in his lower extremities and dyslipidemia. Given this, we feel it is appropriate to start him on a statin. A1c 5.5.   - continue atorvastatin 20mg  qd  - continue aspirin 81mg  qd  ??  Eosinophilia and strongyloidiasis   He has had eosinophilia (900 to 1000) as far back as 10/2010, per record review; this remains stable at this time. His??stool O&P, GIPP were??negative.??Strongyloid Ab??positive on??8/21. S/p 2nd course of ivermectin completed 9/13. COVID-19 infection (resolved)  Asymptomatic. Completed 5 days remdesivir.??  Date of positive SARS-CoV-2 PCR: 12/09/2018 at OSH. Patient was 21 days from positive PCR on 9/9 and precautions discontinued.  ??      Objective:   Temp:  [36.3 ??C (97.3 ??F)-36.8 ??C (98.2 ??F)] 36.3 ??C (97.3 ??F)  Heart Rate:  [95-115] 115  Resp:  [16-18] 16  BP: (126-133)/(78-84) 126/78  SpO2:  [98 %-99 %] 99 %    General: No acute distress, alert, oriented, answers questions appropriately   HEENT: atraumatic, sclera anicteric   Heart: RRR, no M/R/G  Lungs: Clear to auscultation bilaterally   Abdomen: Normoactive bowel sounds, soft, non-tender, non-distended, no rebound/guarding  Extremities: Foot exam with bilateral thick blackened scale on toes with discoloration of dorsal feet, unchanged from prior exam.  L index finger with normal color, L middle finger bandaged  Neuro: 0/5 strength in bilateral LE. Has sensation to light touch down to just above knees, no sensation to light touch below, unchanged from prior.  GU: defer    Psych: Appropriate mood and affect      Labs/Studies: Labs and studies reviewed and pertinent information is addressed in the plan      Don [name] - MS3

## 2019-01-16 NOTE — Unmapped (Signed)
Infectious Disease (MDK) Progress Note      Subjective and 24-hour Events:   No acute events overnight. Ready to start inpatient rehab.     Assessment & Plan:   Don [name]??is a 43 y.o.??male??with??previously untreated HIV/AIDS??(CD4 142 on admission) who initially presented with dry gangrene to the 2nd and 3rd digits of L hand and pain/weakness/discoloration over his lower extremities, subsequently found to be COVID??+ without respiratory difficulties (now off precautions), hospital course complicated by cauda equina syndrome leading to LE paralysis with urinary retention, found to have patchy hyperintensities in the spine on MRI c/w transverse myelitis. Also found to have enhancing lesion in the R corona radiata. Workup notable for Toxoplasma and EBV in his CSF, as well as CMV+ low level in the peripheral blood but negative in CSF. He was most recently found to have significant diffuse lymphadenopathy on PET scan concerning for lymphoma and is now s/p left axillary excisional lymph node biopsy on 9/10, which was negative for malignancy and organisms.  Repeat brain MRI 9/22 showed decreased size of lesion in the R corona radiata, increasing our suspicion for toxoplasmosis and lowering suspicion for CNS lymphoma.  ??  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 Repeat imaging showing decreasing brain lesions suggesting toxo with ongoing treatment. After discussing all options with the patient, we will move forward with transfer to [facility] (order placed 9/22). Failed spontaneous void trial 9/24, foley replaced.   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  Anticipate a 4 to 6-week course and then transition to Bactrim after completion for PJP prophylaxis and toxo maintenance  ????????????????????  Penile Ulcers - HSV2 (resolved)  Penile lesions identified by nurse on 9/16. 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  Patient was started on valacyclovir 1000 mg twice daily for 10 days. Urine GC/CT negative. On 9/20 the lesions seemed to be improving, and on 9/24 they were almost completely resolved.  - Continue valacyclovir 1000mg  bid (9/18-9/27)    Dry gangrene L hand (improving)    Etiology remains unclear, likely multifactorial. Bilateral UE arterial PVLs without any HD-significant findings and blood cultures obtained in the ED were negative. Has had extensive work up including multispecialty consultations including rheumatology, vascular surgery, hematology. Likely early atherosclerotic disease and recommended baby aspirin and agree with starting a statin (see ASCVD risk, below).   - Outpatient follow-up in hand clinic   - bandage changes daily  ??  Discoloration bilateral lower extremities   Etiology remains unclear. Upon further close review with the infectious disease team, this does not appear to be gangrene. 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 Given that the skin lesions seem restricted to the dorsum of the foot, this may just be a severe dermatophyte infection in a highly immunocompromised host. Itraconazole started 9/14. Per Pharmacy team, griseofulvin is a better option for his tinea pedis and onychomycosis, so we switched to that on 9/16. On 9/21 the scab from his biopsy site on L foot came off and started bleeding. This superficial wound does not appear  to be healing well and so we will consult the wound nurse for dressing.  - wound nurse order placed 9/21  - continue griseofulvin 1000mg  suspension daily (6 month treatment course)  - Continue to monitor  ??  Tinea pedis   Trichophyton confirmed on biopsy.   - Griseofulvin as above  - Continue econazole cream to bilateral feet BID (started 12/11/2018)  ??  Untreated HIV infection; AIDS    Diagnosed in 2007 and was treatment naive on admission (had previously declined ART due to religious reasons).??His CD4 count was 142 and VL was 341,000 (8/20). He was noted to have oral thrush at [facility]. Started Tivacay/Descovy 8/26.??CD4 count 9/17 improved to 231.  - Plan for switch to [facility] on discharge. Dr. Dolores Frame has initiated process to coordinate outpatient follow-up and drug assistance enrollment.   - Will need bactrim prophylaxis for PJP (hold for now)   - Continue nystatin swishes for oral thrush    ASCVD Risk  Pooled Cohort 10-yr risk is 3%, however, HIV is an independent risk factor for CVD which is not included in the risk score. Furthermore, he already has significant atherosclerotic disease in his lower extremities and dyslipidemia. Given this, we feel it is appropriate to start him on a statin. A1c 5.5.   - continue atorvastatin 20mg  qd  - continue aspirin 81mg  qd  ??  Eosinophilia and strongyloidiasis   He has had eosinophilia (900 to 1000) as far back as 10/2010, per record review; this remains stable at this time. His??stool O&P, GIPP were??negative.??Strongyloid Ab??positive on??8/21. S/p 2nd course of ivermectin completed 9/13.   ??  COVID-19 infection (resolved)  Asymptomatic. Completed 5 days remdesivir.??  Date of positive SARS-CoV-2 PCR: 12/09/2018 at OSH. Patient was 21 days from positive PCR on 9/9 and precautions discontinued.  ??      Objective:   Temp:  [36.7 ??C-37.2 ??C] 36.7 ??C  Heart Rate:  [86-101] 96  Resp:  [14-16] 16  BP: (117-125)/(69-78) 120/69  SpO2:  [97 %-100 %] 97 %    General: No acute distress, alert, oriented, answers questions appropriately   HEENT: atraumatic, sclera anicteric   Heart: RRR, no M/R/G  Lungs: Clear to auscultation bilaterally   Abdomen: Normoactive bowel sounds, soft, non-tender, non-distended, no rebound/guarding  Extremities: Foot exam with bilateral thick blackened scale on toes with discoloration of dorsal feet, unchanged from prior exam.  L index finger with normal color, L middle finger bandaged  Neuro: 0/5 strength in bilateral LE. Has sensation to light touch down to just above knees, no sensation to light touch below, unchanged from prior.  GU: defer    Psych: Appropriate mood and affect      Labs/Studies: Labs and studies reviewed and pertinent information is addressed in the plan

## 2019-01-16 NOTE — Unmapped (Signed)
VVS, no c/o pain.  Problem: Adult Inpatient Plan of Care  Goal: Plan of Care Review  Outcome: Ongoing - Unchanged  Goal: Patient-Specific Goal (Individualization)  Outcome: Ongoing - Unchanged  Goal: Absence of Hospital-Acquired Illness or Injury  Outcome: Ongoing - Unchanged  Goal: Optimal Comfort and Wellbeing  Outcome: Ongoing - Unchanged  Goal: Readiness for Transition of Care  Outcome: Ongoing - Unchanged  Goal: Rounds/Family Conference  Outcome: Ongoing - Unchanged     Problem: Infection  Goal: Infection Symptom Resolution  Outcome: Ongoing - Unchanged     Problem: Fall Injury Risk  Goal: Absence of Fall and Fall-Related Injury  Outcome: Ongoing - Unchanged     Problem: Self-Care Deficit  Goal: Improved Ability to Complete Activities of Daily Living  Outcome: Ongoing - Unchanged     Problem: Wound  Goal: Optimal Wound Healing  Outcome: Ongoing - Unchanged     Problem: Skin Injury Risk Increased  Goal: Skin Health and Integrity  Outcome: Ongoing - Unchanged

## 2019-01-17 LAB — CBC W/ AUTO DIFF
BASOPHILS RELATIVE PERCENT: 1.4 %
EOSINOPHILS ABSOLUTE COUNT: 0.8 10*9/L — ABNORMAL HIGH (ref 0.0–0.4)
EOSINOPHILS RELATIVE PERCENT: 15.3 %
HEMATOCRIT: 37.8 % — ABNORMAL LOW (ref 41.0–53.0)
HEMOGLOBIN: 12.5 g/dL — ABNORMAL LOW (ref 13.5–17.5)
LARGE UNSTAINED CELLS: 3 % (ref 0–4)
LYMPHOCYTES ABSOLUTE COUNT: 1.6 10*9/L (ref 1.5–5.0)
LYMPHOCYTES RELATIVE PERCENT: 31.4 %
MEAN CORPUSCULAR HEMOGLOBIN CONC: 33.2 g/dL (ref 31.0–37.0)
MEAN CORPUSCULAR HEMOGLOBIN: 28.6 pg (ref 26.0–34.0)
MEAN CORPUSCULAR VOLUME: 86.1 fL (ref 80.0–100.0)
MEAN PLATELET VOLUME: 7.9 fL (ref 7.0–10.0)
MONOCYTES ABSOLUTE COUNT: 0.5 10*9/L (ref 0.2–0.8)
MONOCYTES RELATIVE PERCENT: 9.2 %
NEUTROPHILS ABSOLUTE COUNT: 2 10*9/L (ref 2.0–7.5)
NEUTROPHILS RELATIVE PERCENT: 39.4 %
PLATELET COUNT: 321 10*9/L (ref 150–440)
RED CELL DISTRIBUTION WIDTH: 17.3 % — ABNORMAL HIGH (ref 12.0–15.0)
WBC ADJUSTED: 5.2 10*9/L (ref 4.5–11.0)

## 2019-01-17 LAB — BASIC METABOLIC PANEL
ANION GAP: 11 mmol/L (ref 7–15)
BLOOD UREA NITROGEN: 12 mg/dL (ref 7–21)
BUN / CREAT RATIO: 16
CALCIUM: 9.1 mg/dL (ref 8.5–10.2)
CO2: 25 mmol/L (ref 22.0–30.0)
CREATININE: 0.74 mg/dL (ref 0.70–1.30)
EGFR CKD-EPI AA MALE: >90 mL/min/{1.73_m2} (ref >=60)
EGFR CKD-EPI NON-AA MALE: >90 mL/min/{1.73_m2} (ref >=60)
GLUCOSE RANDOM: 137 mg/dL — ABNORMAL HIGH (ref 70–99)
POTASSIUM: 4.3 mmol/L (ref 3.5–5.0)

## 2019-01-17 LAB — MAGNESIUM: Magnesium:MCnc:Pt:Ser/Plas:Qn:: 1.8

## 2019-01-17 LAB — ANION GAP: Anion gap 3:SCnc:Pt:Ser/Plas:Qn:: 11

## 2019-01-17 LAB — EOSINOPHILS ABSOLUTE COUNT: Lab: 0.8 — ABNORMAL HIGH

## 2019-01-17 NOTE — Unmapped (Signed)
Pt alert and oriented x4 - turns self, weight shifting encouragement continues by staff - L middle finger dressing changed this shift without incident - pain adequately managed with PRN PO pain medication - no acute concerns this shift - will continue POC    Problem: Adult Inpatient Plan of Care  Goal: Plan of Care Review  Outcome: Progressing  Goal: Patient-Specific Goal (Individualization)  Outcome: Progressing  Goal: Absence of Hospital-Acquired Illness or Injury  Outcome: Progressing  Goal: Optimal Comfort and Wellbeing  Outcome: Progressing  Goal: Readiness for Transition of Care  Outcome: Progressing  Goal: Rounds/Family Conference  Outcome: Progressing     Problem: Infection  Goal: Infection Symptom Resolution  Outcome: Progressing     Problem: Fall Injury Risk  Goal: Absence of Fall and Fall-Related Injury  Outcome: Progressing     Problem: Self-Care Deficit  Goal: Improved Ability to Complete Activities of Daily Living  Outcome: Progressing     Problem: Wound  Goal: Optimal Wound Healing  Outcome: Progressing     Problem: Skin Injury Risk Increased  Goal: Skin Health and Integrity  Outcome: Progressing

## 2019-01-17 NOTE — Unmapped (Signed)
Infectious Disease ([facility]) Progress Note      Subjective and 24-hour Events:     Anxious to begin inpatient rehab. Feeling very tired today, having difficulty sleeping in the hospital.     Assessment & Plan:   Don [name]??is a 43 y.o.??male??with??previously untreated HIV/AIDS??(CD4 142 on admission) who initially presented with dry gangrene to the 2nd and 3rd digits of L hand and pain/weakness/discoloration over his lower extremities, subsequently found to be COVID??+ without respiratory difficulties (now off precautions), hospital course complicated by cauda equina syndrome leading to LE paralysis with urinary retention, found to have patchy hyperintensities in the spine on MRI c/w transverse myelitis. Also found to have enhancing lesion in the R corona radiata. Workup notable for Toxoplasma and EBV in his CSF, as well as CMV+ low level in the peripheral blood but negative in CSF. He was most recently found to have significant diffuse lymphadenopathy on PET scan concerning for lymphoma and is now s/p left axillary excisional lymph node biopsy on 9/10, which was negative for malignancy and organisms.  Repeat brain MRI 9/22 showed decreased size of lesion in the R corona radiata, increasing our suspicion for toxoplasmosis and lowering suspicion for CNS lymphoma.  ??  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 Repeat imaging showing decreasing brain lesions suggesting toxo with ongoing treatment. After discussing all options with the patient, we will move forward with transfer to [facility] (order placed 9/22). Failed spontaneous void trial 9/24, foley replaced.   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  Anticipate a 4 to 6-week course and then transition to Bactrim after completion for PJP prophylaxis and toxo maintenance  ????????????????????  Penile Ulcers - HSV2 (resolved)  Penile lesions identified by nurse on 9/16. 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  Patient was started on valacyclovir 1000 mg twice daily for 10 days. Urine GC/CT negative. On 9/20 the lesions seemed to be improving, and on 9/24 they were almost completely resolved.  - Continue valacyclovir 1000mg  bid (9/18-9/27)    Dry gangrene L hand (improving)    Etiology remains unclear, likely multifactorial. Bilateral UE arterial PVLs without any HD-significant findings and blood cultures obtained in the ED were negative. Has had extensive work up including multispecialty consultations including rheumatology, vascular surgery, hematology. Likely early atherosclerotic disease and recommended baby aspirin and agree with starting a statin (see ASCVD risk, below).   - Outpatient follow-up in hand clinic   - bandage changes daily  ??  Discoloration bilateral lower extremities   Etiology remains unclear. Upon further close review with the infectious disease team, this does not appear to be gangrene. 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 Given that the skin lesions seem restricted to the dorsum of the foot, this may just be a severe dermatophyte infection in a highly immunocompromised host. Itraconazole started 9/14. Per Pharmacy team, griseofulvin is a better option for his tinea pedis and onychomycosis, so we switched to that on 9/16. On 9/21 the scab from his biopsy site on L foot came off and  started bleeding. This superficial wound does not appear to be healing well and so we will consult the wound nurse for dressing.  - wound nurse order placed 9/21  - continue griseofulvin 1000mg  suspension daily (6 month treatment course)  - Continue to monitor  ??  Tinea pedis   Trichophyton confirmed on biopsy.   - Griseofulvin as above  - Continue econazole cream to bilateral feet BID (started 12/11/2018)  ??  Untreated HIV infection; AIDS    Diagnosed in 2007 and was treatment naive on admission (had previously declined ART due to religious reasons).??His CD4 count was 142 and VL was 341,000 (8/20). He was noted to have oral thrush at [facility]. Started Tivacay/Descovy 8/26.??CD4 count 9/17 improved to 231.  - Plan for switch to [facility] on discharge. Dr. Dolores Frame has initiated process to coordinate outpatient follow-up and drug assistance enrollment.   - Will need bactrim prophylaxis for PJP (hold for now)   - Continue nystatin swishes for oral thrush    ASCVD Risk  Pooled Cohort 10-yr risk is 3%, however, HIV is an independent risk factor for CVD which is not included in the risk score. Furthermore, he already has significant atherosclerotic disease in his lower extremities and dyslipidemia. Given this, we feel it is appropriate to start him on a statin. A1c 5.5.   - continue atorvastatin 20mg  qd  - continue aspirin 81mg  qd  ??  Eosinophilia and strongyloidiasis   He has had eosinophilia (900 to 1000) as far back as 10/2010, per record review; this remains stable at this time. His??stool O&P, GIPP were??negative.??Strongyloid Ab??positive on??8/21. S/p 2nd course of ivermectin completed 9/13.   ??  COVID-19 infection (resolved)  Asymptomatic. Completed 5 days remdesivir.??  Date of positive SARS-CoV-2 PCR: 12/09/2018 at OSH. Patient was 21 days from positive PCR on 9/9 and precautions discontinued.  ??    Insomnia  - Melatonin 3mg  qhs  - Trazodone 50mg  daily prn (Not home medication)      Objective:   Temp:  [36.8 ??C-37.1 ??C] 36.8 ??C  Heart Rate:  [86-95] 86  Resp:  [14] 14  BP: (126-129)/(73-74) 129/73  SpO2:  [98 %-99 %] 99 %    General: No acute distress, alert, oriented, answers questions appropriately Heart: RRR  Lungs: Respirations unlabored on room air. Clear to auscultation bilaterally   Abdomen: Normoactive bowel sounds, soft, non-tender, non-distended, no rebound/guarding  Neuro: 0/5 strength in bilateral LE    Labs/Studies: Labs and studies reviewed and pertinent information is addressed in the plan

## 2019-01-17 NOTE — Unmapped (Signed)
Pt alert and oriented x4, feet and groin rash responding well to cream - no acute concerns this shift - dressings changed without incident - Pt endorses mild pain but declines pain medication this shift    Problem: Adult Inpatient Plan of Care  Goal: Plan of Care Review  Outcome: Progressing  Goal: Patient-Specific Goal (Individualization)  Outcome: Progressing  Goal: Absence of Hospital-Acquired Illness or Injury  Outcome: Progressing  Goal: Optimal Comfort and Wellbeing  Outcome: Progressing  Goal: Readiness for Transition of Care  Outcome: Progressing  Goal: Rounds/Family Conference  Outcome: Progressing     Problem: Infection  Goal: Infection Symptom Resolution  Outcome: Progressing     Problem: Fall Injury Risk  Goal: Absence of Fall and Fall-Related Injury  Outcome: Progressing     Problem: Self-Care Deficit  Goal: Improved Ability to Complete Activities of Daily Living  Outcome: Progressing     Problem: Wound  Goal: Optimal Wound Healing  Outcome: Progressing     Problem: Skin Injury Risk Increased  Goal: Skin Health and Integrity  Outcome: Progressing

## 2019-01-18 NOTE — Unmapped (Signed)
Pt's pain needs addressed. Pt incontinent of large bowel movement overnight.  Problem: Adult Inpatient Plan of Care  Goal: Plan of Care Review  Outcome: Progressing  Goal: Patient-Specific Goal (Individualization)  Outcome: Progressing  Goal: Absence of Hospital-Acquired Illness or Injury  Outcome: Progressing  Goal: Optimal Comfort and Wellbeing  Outcome: Progressing  Goal: Readiness for Transition of Care  Outcome: Progressing  Goal: Rounds/Family Conference  Outcome: Progressing     Problem: Infection  Goal: Infection Symptom Resolution  Outcome: Progressing     Problem: Fall Injury Risk  Goal: Absence of Fall and Fall-Related Injury  Outcome: Progressing     Problem: Self-Care Deficit  Goal: Improved Ability to Complete Activities of Daily Living  Outcome: Progressing     Problem: Wound  Goal: Optimal Wound Healing  Outcome: Progressing     Problem: Skin Injury Risk Increased  Goal: Skin Health and Integrity  Outcome: Progressing

## 2019-01-18 NOTE — Unmapped (Signed)
Infectious Disease ([facility]) Progress Note      Subjective and 24-hour Events:     Don [name]. Feeling frustrated he is still on the floor and hasn't begun inpatient rehab.      Assessment & Plan:   Egidio Torralba??is a 43 y.o.??male??with??previously untreated HIV/AIDS??(CD4 142 on admission) who initially presented with dry gangrene to the 2nd and 3rd digits of L hand and pain/weakness/discoloration over his lower extremities, subsequently found to be COVID??+ without respiratory difficulties (now off precautions), hospital course complicated by cauda equina syndrome leading to LE paralysis with urinary retention, found to have patchy hyperintensities in the spine on MRI c/w transverse myelitis. Also found to have enhancing lesion in the R corona radiata. Workup notable for Toxoplasma and EBV in his CSF, as well as CMV+ low level in the peripheral blood but negative in CSF. He was most recently found to have significant diffuse lymphadenopathy on PET scan concerning for lymphoma and is now s/p left axillary excisional lymph node biopsy on 9/10, which was negative for malignancy and organisms.  Repeat brain MRI 9/22 showed decreased size of lesion in the R corona radiata, increasing our suspicion for toxoplasmosis and lowering suspicion for CNS lymphoma.  ??  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 Repeat imaging showing decreasing brain lesions suggesting toxo with ongoing treatment. After discussing all options with the patient, we will move forward with transfer to [facility] (order placed 9/22). Failed spontaneous void trial 9/24, foley replaced.   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  Anticipate a 4 to 6-week course and then transition to Bactrim after completion for PJP prophylaxis and toxo maintenance  ????????????????????  Penile Ulcers - HSV2 (resolved)  Penile lesions identified by nurse on 9/16. 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  Patient was started on valacyclovir 1000 mg twice daily for 10 days. Urine GC/CT negative. On 9/20 the lesions seemed to be improving, and on 9/24 they were almost completely resolved.  - Continue valacyclovir 1000mg  bid (9/18-9/27)    Dry gangrene L hand (improving)    Etiology remains unclear, likely multifactorial. Bilateral UE arterial PVLs without any HD-significant findings and blood cultures obtained in the ED were negative. Has had extensive work up including multispecialty consultations including rheumatology, vascular surgery, hematology. Likely early atherosclerotic disease and recommended baby aspirin and agree with starting a statin (see ASCVD risk, below).   - Outpatient follow-up in hand clinic   - bandage changes daily  ??  Discoloration bilateral lower extremities   Etiology remains unclear. Upon further close review with the infectious disease team, this does not appear to be gangrene. 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 Given that the skin lesions seem restricted to the dorsum of the foot, this may just be a severe dermatophyte infection in a highly immunocompromised host. Itraconazole started 9/14. Per Pharmacy team, griseofulvin is a better option for his tinea pedis and onychomycosis, so we switched to that on 9/16. On 9/21 the scab from his biopsy site on L foot came off and  started bleeding. This superficial wound does not appear to be healing well and so we will consult the wound nurse for dressing.  - wound nurse order placed 9/21  - continue griseofulvin 1000mg  suspension daily (6 month treatment course)  - Continue to monitor  ??  Tinea pedis   Trichophyton confirmed on biopsy.   - Griseofulvin as above  - Continue econazole cream to bilateral feet BID (started 12/11/2018)  ??  Untreated HIV infection; AIDS    Diagnosed in 2007 and was treatment naive on admission (had previously declined ART due to religious reasons).??His CD4 count was 142 and VL was 341,000 (8/20). He was noted to have oral thrush at [facility]. Started Tivacay/Descovy 8/26.??CD4 count 9/17 improved to 231.  - Plan for switch to [facility] on discharge. Dr. Dolores Frame has initiated process to coordinate outpatient follow-up and drug assistance enrollment.   - Will need bactrim prophylaxis for PJP (hold for now)   - Continue nystatin swishes for oral thrush    ASCVD Risk  Pooled Cohort 10-yr risk is 3%, however, HIV is an independent risk factor for CVD which is not included in the risk score. Furthermore, he already has significant atherosclerotic disease in his lower extremities and dyslipidemia. Given this, we feel it is appropriate to start him on a statin. A1c 5.5.   - continue atorvastatin 20mg  qd  - continue aspirin 81mg  qd  ??  Eosinophilia and strongyloidiasis   He has had eosinophilia (900 to 1000) as far back as 10/2010, per record review; this remains stable at this time. His??stool O&P, GIPP were??negative.??Strongyloid Ab??positive on??8/21. S/p 2nd course of ivermectin completed 9/13.   ??  COVID-19 infection (resolved)  Asymptomatic. Completed 5 days remdesivir.??  Date of positive SARS-CoV-2 PCR: 12/09/2018 at OSH. Patient was 21 days from positive PCR on 9/9 and precautions discontinued.  ??    Insomnia  - Melatonin 3mg  qhs  - Trazodone 50mg  daily prn (Not home medication)      Objective:   Temp:  [37 ??C-37.1 ??C] 37 ??C  Heart Rate:  [92-107] 92  Resp:  [14] 14  BP: (129-132)/(78-79) 129/79  SpO2:  [97 %-100 %] 100 %    General: No acute distress, alert, oriented, answers questions appropriately   Heart: RRR  Lungs: Respirations unlabored on room air. Clear to auscultation bilaterally   Abdomen: Normoactive bowel sounds, soft, non-tender, non-distended, no rebound/guarding  Neuro: 0/5 strength in bilateral LE    Labs/Studies: Labs and studies reviewed and pertinent information is addressed in the plan

## 2019-01-19 NOTE — Unmapped (Signed)
Problem: Adult Inpatient Plan of Care  Goal: Plan of Care Review  Outcome: Progressing  Pt remains alert and orientedx4, VSS on room air, denied pain, dressing were changed and documented, bil feet floated by bunny boots, cont oral antibx, keep monitoring  Goal: Patient-Specific Goal (Individualization)  Outcome: Progressing  Goal: Absence of Hospital-Acquired Illness or Injury  Outcome: Progressing  Goal: Optimal Comfort and Wellbeing  Outcome: Progressing  Goal: Readiness for Transition of Care  Outcome: Progressing  Goal: Rounds/Family Conference  Outcome: Progressing     Problem: Infection  Goal: Infection Symptom Resolution  Outcome: Progressing     Problem: Fall Injury Risk  Goal: Absence of Fall and Fall-Related Injury  Outcome: Progressing     Problem: Self-Care Deficit  Goal: Improved Ability to Complete Activities of Daily Living  Outcome: Progressing     Problem: Wound  Goal: Optimal Wound Healing  Outcome: Progressing     Problem: Skin Injury Risk Increased  Goal: Skin Health and Integrity  Outcome: Progressing

## 2019-01-19 NOTE — Unmapped (Signed)
Infectious Disease ([facility]) Progress Note      Subjective and 24-hour Events:     NAEON. Awaiting [facility] bed placement.     Assessment & Plan:   Don [name]??is a 43 y.o.??male??with??previously untreated HIV/AIDS??(CD4 142 on admission) who initially presented with dry gangrene to the 2nd and 3rd digits of L hand and pain/weakness/discoloration over his lower extremities, subsequently found to be COVID??+ without respiratory difficulties (now off precautions), hospital course complicated by cauda equina syndrome leading to LE paralysis with urinary retention, found to have patchy hyperintensities in the spine on MRI c/w transverse myelitis. Also found to have enhancing lesion in the R corona radiata. Workup notable for Toxoplasma and EBV in his CSF, as well as CMV+ low level in the peripheral blood but negative in CSF. He was most recently found to have significant diffuse lymphadenopathy on PET scan concerning for lymphoma and is now s/p left axillary excisional lymph node biopsy on 9/10, which was negative for malignancy and organisms.  Repeat brain MRI 9/22 showed decreased size of lesion in the R corona radiata, increasing our suspicion for toxoplasmosis and lowering suspicion for CNS lymphoma.  ??  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 Repeat imaging showing decreasing brain lesions suggesting toxo with ongoing treatment. After discussing all options with the patient, we will move forward with transfer to [facility] (order placed 9/22). Failed spontaneous void trial 9/24, foley replaced.   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  Anticipate a 4 to 6-week course and then transition to Bactrim after completion for PJP prophylaxis and toxo maintenance  ????????????????????  Penile Ulcers - HSV2 (resolved)  Penile lesions identified by nurse on 9/16. 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  Patient was started on valacyclovir 1000 mg twice daily for 10 days. Urine GC/CT negative. On 9/20 the lesions seemed to be improving, and on 9/24 they were almost completely resolved.  - Valacyclovir 1000mg  bid (9/18-9/27)    Dry gangrene L hand (improving)    Etiology remains unclear, likely multifactorial. Bilateral UE arterial PVLs without any HD-significant findings and blood cultures obtained in the ED were negative. Has had extensive work up including multispecialty consultations including rheumatology, vascular surgery, hematology. Likely early atherosclerotic disease and recommended baby aspirin and agree with starting a statin (see ASCVD risk, below).   - Outpatient follow-up in hand clinic   - bandage changes daily  ??  Discoloration bilateral lower extremities   Etiology remains unclear. Upon further close review with the infectious disease team, this does not appear to be gangrene. 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 Given that the skin lesions seem restricted to the dorsum of the foot, this may just be a severe dermatophyte infection in a highly immunocompromised host. Itraconazole started 9/14. Per Pharmacy team, griseofulvin is a better option for his tinea pedis and onychomycosis, so we switched to that on 9/16. On 9/21 the scab from his biopsy site on L foot came off and started bleeding. This superficial wound does not appear to be healing  well and so we will consult the wound nurse for dressing.  - wound nurse order placed 9/21  - continue griseofulvin 1000mg  suspension daily (6 month treatment course)  - Continue to monitor  ??  Tinea pedis   Trichophyton confirmed on biopsy.   - Griseofulvin as above  - Continue econazole cream to bilateral feet BID (started 12/11/2018)  ??  Untreated HIV infection; AIDS    Diagnosed in 2007 and was treatment naive on admission (had previously declined ART due to religious reasons).??His CD4 count was 142 and VL was 341,000 (8/20). He was noted to have oral thrush at [facility]. Started Tivacay/Descovy 8/26.??CD4 count 9/17 improved to 231.  - Plan for switch to [facility] on discharge. Dr. Dolores Frame has initiated process to coordinate outpatient follow-up and drug assistance enrollment.   - Will need bactrim prophylaxis for PJP (hold for now)   - Continue nystatin swishes for oral thrush    ASCVD Risk  Pooled Cohort 10-yr risk is 3%, however, HIV is an independent risk factor for CVD which is not included in the risk score. Furthermore, he already has significant atherosclerotic disease in his lower extremities and dyslipidemia. Given this, we feel it is appropriate to start him on a statin. A1c 5.5.   - continue atorvastatin 20mg  qd  - continue aspirin 81mg  qd  ??  Eosinophilia and strongyloidiasis   He has had eosinophilia (900 to 1000) as far back as 10/2010, per record review; this remains stable at this time. His??stool O&P, GIPP were??negative.??Strongyloid Ab??positive on??8/21. S/p 2nd course of ivermectin completed 9/13.   ??  COVID-19 infection (resolved)  Asymptomatic. Completed 5 days remdesivir.??  Date of positive SARS-CoV-2 PCR: 12/09/2018 at OSH. Patient was 21 days from positive PCR on 9/9 and precautions discontinued.  ??    Insomnia  - Melatonin 3mg  qhs  - Trazodone 50mg  daily prn (Not home medication)      Objective:   Temp:  [36.5 ??C-36.9 ??C] 36.5 ??C  Heart Rate:  [86-102] 86  Resp:  [14-16] 16  BP: (118-142)/(71-81) 142/81  SpO2:  [98 %-100 %] 100 %    General: No acute distress, alert, oriented, answers questions appropriately   Heart: RRR  Lungs: Respirations unlabored on room air. Clear to auscultation bilaterally   Abdomen: Normoactive bowel sounds, soft, non-tender, non-distended, no rebound/guarding  Neuro: 0/5 strength in bilateral LE    Labs/Studies: Labs and studies reviewed and pertinent information is addressed in the plan

## 2019-01-19 NOTE — Unmapped (Signed)
Patient is alert and oriented  x4, afebrile. Continue with po antibiotic therapy. He is incontinent of bowels, foley intact patent. Turned and reposition every two hours and as needed. No falls. Will continue with POC.  Problem: Adult Inpatient Plan of Care  Goal: Plan of Care Review  Outcome: Progressing     Problem: Adult Inpatient Plan of Care  Goal: Patient-Specific Goal (Individualization)  Outcome: Progressing     Problem: Adult Inpatient Plan of Care  Goal: Absence of Hospital-Acquired Illness or Injury  Outcome: Progressing     Problem: Adult Inpatient Plan of Care  Goal: Optimal Comfort and Wellbeing  Outcome: Progressing     Problem: Adult Inpatient Plan of Care  Goal: Readiness for Transition of Care  Outcome: Progressing     Problem: Infection  Goal: Infection Symptom Resolution  Outcome: Progressing     Problem: Fall Injury Risk  Goal: Absence of Fall and Fall-Related Injury  Outcome: Progressing     Problem: Self-Care Deficit  Goal: Improved Ability to Complete Activities of Daily Living  Outcome: Progressing     Problem: Wound  Goal: Optimal Wound Healing  Outcome: Progressing     Problem: Skin Injury Risk Increased  Goal: Skin Health and Integrity  Outcome: Progressing

## 2019-01-19 NOTE — Unmapped (Signed)
VVS; no acute events overnight. Addressed hand pain last night;denies pain this morning.  Problem: Adult Inpatient Plan of Care  Goal: Plan of Care Review  Outcome: Progressing  Goal: Patient-Specific Goal (Individualization)  Outcome: Progressing  Goal: Absence of Hospital-Acquired Illness or Injury  Outcome: Progressing  Goal: Optimal Comfort and Wellbeing  Outcome: Progressing  Goal: Readiness for Transition of Care  Outcome: Progressing  Goal: Rounds/Family Conference  Outcome: Progressing     Problem: Infection  Goal: Infection Symptom Resolution  Outcome: Progressing     Problem: Fall Injury Risk  Goal: Absence of Fall and Fall-Related Injury  Outcome: Progressing     Problem: Self-Care Deficit  Goal: Improved Ability to Complete Activities of Daily Living  Outcome: Progressing     Problem: Wound  Goal: Optimal Wound Healing  Outcome: Progressing     Problem: Skin Injury Risk Increased  Goal: Skin Health and Integrity  Outcome: Progressing

## 2019-01-20 LAB — CBC W/ AUTO DIFF
BASOPHILS ABSOLUTE COUNT: 0 10*9/L (ref 0.0–0.1)
BASOPHILS RELATIVE PERCENT: 0.6 %
EOSINOPHILS ABSOLUTE COUNT: 0.9 10*9/L — ABNORMAL HIGH (ref 0.0–0.4)
EOSINOPHILS RELATIVE PERCENT: 14.8 %
HEMATOCRIT: 36.8 % — ABNORMAL LOW (ref 41.0–53.0)
HEMOGLOBIN: 12.4 g/dL — ABNORMAL LOW (ref 13.5–17.5)
LARGE UNSTAINED CELLS: 3 % (ref 0–4)
LYMPHOCYTES ABSOLUTE COUNT: 1.7 10*9/L (ref 1.5–5.0)
LYMPHOCYTES RELATIVE PERCENT: 28.9 %
MEAN CORPUSCULAR HEMOGLOBIN CONC: 33.7 g/dL (ref 31.0–37.0)
MEAN CORPUSCULAR HEMOGLOBIN: 28.9 pg (ref 26.0–34.0)
MONOCYTES ABSOLUTE COUNT: 0.4 10*9/L (ref 0.2–0.8)
MONOCYTES RELATIVE PERCENT: 6.8 %
NEUTROPHILS ABSOLUTE COUNT: 2.7 10*9/L (ref 2.0–7.5)
NEUTROPHILS RELATIVE PERCENT: 45.8 %
PLATELET COUNT: 318 10*9/L (ref 150–440)
RED BLOOD CELL COUNT: 4.29 10*12/L — ABNORMAL LOW (ref 4.50–5.90)
RED CELL DISTRIBUTION WIDTH: 17.2 % — ABNORMAL HIGH (ref 12.0–15.0)
WBC ADJUSTED: 5.9 10*9/L (ref 4.5–11.0)

## 2019-01-20 LAB — BASIC METABOLIC PANEL
ANION GAP: 13 mmol/L (ref 7–15)
BLOOD UREA NITROGEN: 12 mg/dL (ref 7–21)
BUN / CREAT RATIO: 16
CALCIUM: 9.4 mg/dL (ref 8.5–10.2)
CHLORIDE: 101 mmol/L (ref 98–107)
CO2: 23 mmol/L (ref 22.0–30.0)
CREATININE: 0.74 mg/dL (ref 0.70–1.30)
EGFR CKD-EPI NON-AA MALE: >90 mL/min/{1.73_m2} (ref >=60)
POTASSIUM: 4.3 mmol/L (ref 3.5–5.0)
SODIUM: 137 mmol/L (ref 135–145)

## 2019-01-20 LAB — MEAN PLATELET VOLUME: Lab: 7.2

## 2019-01-20 LAB — MAGNESIUM: Magnesium:MCnc:Pt:Ser/Plas:Qn:: 1.8

## 2019-01-20 LAB — GLUCOSE RANDOM: Glucose:MCnc:Pt:Ser/Plas:Qn:: 98

## 2019-01-20 NOTE — Unmapped (Signed)
Infectious Disease ([facility]) Progress Note      Subjective and 24-hour Events:     NAEON. Awaiting AIR bed placement.     Assessment & Plan:   Don Mitchellis a 43 y.o.??male??with??previously untreated HIV/AIDS??(CD4 142 on admission) who initially presented with dry gangrene to the 2nd and 3rd digits of L hand and pain/weakness/discoloration over his lower extremities, subsequently found to be COVID??+ without respiratory difficulties (now off precautions). Hospital course complicated by cauda equina leading to LE paralysis with urinary retention, found to have patchy hyperintensities in the spine on MRI c/w transverse myelitis.  ??  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 Repeat imaging shows decreasing size of lesions s/p initiation of toxo treatment with sulfadiazine, pyrimethamine and leucovorin. Patient still with significant lower extremity neurological/motor inability. Failed spontaneous void trial 9/24, foley replaced.   - Pending AIR bed placement, later this week? (does NOT req repeat COVID test)  - Sulfadiazine, pyrimethamine, and leucovorin for 6 weeks (8/24 - 10/5), then decrease to chronic/maintenance dosing vs bactrim?    HIV/AIDS   Diagnosed in 2007 and was treatment naive on admission (had previously declined ART due to religious reasons).??His CD4 count was 142 and VL was 341,000 (8/20). Started Tivacay/Descovy 8/26.??Most recent VL 117 on 9/16.  - Plan for switch to [facility] on discharge. Dr. Dolores Frame has initiated process to coordinate outpatient follow-up and drug assistance enrollment.  - Will need bactrim prophylaxis for PJP (hold for now)   - Continue nystatin swishes for oral thrush  ????????????????????  Penile Ulcers - HSV2 (resolved)  Penile lesions identified by nurse on 9/16. 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 Urine GC/CT negative. S/p 10 days Valtrex.     Dry gangrene L hand (improving)    Etiology remains unclear, likely multifactorial. Bilateral UE arterial PVLs without any HD-significant findings and blood cultures obtained in the ED were negative. Has had extensive work up including multispecialty consultations including rheumatology, vascular surgery, hematology. Likely early atherosclerotic disease and recommended baby aspirin and agree with starting a statin (see ASCVD risk, below).   - Outpatient follow-up in hand clinic   - bandage changes daily   ??  Tinea pedis and onychomycosis  Trichophyton confirmed on biopsy.   - Griseofulvin for 6 months total (9/16 - )  - Continue econazole cream to bilateral feet BID    ASCVD Risk  Pooled Cohort 10-yr risk is 3%, however, HIV is an independent risk factor for CVD which is not included in the risk score. Furthermore, he already has significant atherosclerotic disease in his lower extremities and dyslipidemia. Given this, we feel it is appropriate to start him on a statin. A1c 5.5.   - continue atorvastatin 20mg  qd  - continue aspirin 81mg  qd  ??  Eosinophilia and strongyloidiasis (resolved)  He has had eosinophilia (900 to 1000) as far back as 10/2010, per record review; this remains stable at this time. His??stool O&P, GIPP were??negative.??Strongyloid Ab??positive on??8/21. S/p 2nd course of ivermectin completed 9/13.   ??  COVID-19 infection (resolved)  Asymptomatic. Completed 5 days remdesivir.??  Date of positive SARS-CoV-2 PCR: 12/09/2018 at OSH. Patient was 21 days from positive PCR on 9/9 and precautions discontinued.  ??    Insomnia  - Melatonin 3mg  qhs  - Trazodone 50mg  daily prn (Not home medication)      Objective:   Temp:  [36.2 ??C-37.3 ??C] 36.2 ??C  Heart Rate:  [86-88] 86  Resp:  [  16] 16  BP: (125-128)/(77-79) 128/79  SpO2:  [99 %] 99 %    General: No acute distress, alert, oriented, answers questions appropriately   Heart: RRR  Lungs: Respirations unlabored on room air. Clear to auscultation bilaterally   Abdomen: Normoactive bowel sounds, soft, non-tender, non-distended, no rebound/guarding  Neuro: 0/5 strength in bilateral LE    Labs/Studies: Labs and studies reviewed and pertinent information is addressed in the plan

## 2019-01-20 NOTE — Unmapped (Signed)
Pt VSS. Pt afebrile. Pt alert & oriented. Pt c/o pain, PRN pain medication administered, pt reported adequate relief. Pt refused Miralax this shift, LBM 9/29. Off loading boots remain on BLE throughout shift. Dressing remain c/d/I. Pt resting comfortably with bed in lowest position and call bell within reach. Will continue to monitor and assess.    Problem: Adult Inpatient Plan of Care  Goal: Plan of Care Review  Outcome: Progressing  Goal: Patient-Specific Goal (Individualization)  Outcome: Progressing  Goal: Absence of Hospital-Acquired Illness or Injury  Outcome: Progressing  Intervention: Identify and Manage Fall Risk  Flowsheets (Taken 01/20/2019 0425)  Safety Interventions:   fall reduction program maintained   lighting adjusted for tasks/safety   low bed   nonskid shoes/slippers when out of bed  Intervention: Prevent Skin Injury  Flowsheets (Taken 01/20/2019 0425)  Pressure Reduction Techniques: frequent weight shift encouraged  Intervention: Prevent VTE (venous thromboembolism)  Flowsheets (Taken 01/20/2019 0425)  VTE Prevention/Management:   ambulation promoted   fluids promoted  Intervention: Prevent Infection  Flowsheets (Taken 01/20/2019 0425)  Infection Prevention:   handwashing promoted   rest/sleep promoted   single patient room provided  Goal: Optimal Comfort and Wellbeing  Outcome: Progressing  Intervention: Monitor Pain and Promote Comfort  Flowsheets (Taken 01/20/2019 0425)  Pain Management Interventions:   care clustered   pain management plan reviewed with patient/caregiver   position adjusted   quiet environment facilitated  Intervention: Provide Person-Centered Care  Flowsheets (Taken 01/20/2019 0425)  Trust Relationship/Rapport:   care explained   questions answered   questions encouraged  Goal: Readiness for Transition of Care  Outcome: Progressing  Goal: Rounds/Family Conference  Outcome: Progressing     Problem: Infection  Goal: Infection Symptom Resolution  Outcome: Progressing  Intervention: Prevent or Manage Infection  Flowsheets (Taken 01/20/2019 0425)  Infection Management: aseptic technique maintained     Problem: Fall Injury Risk  Goal: Absence of Fall and Fall-Related Injury  Outcome: Progressing  Intervention: Identify and Manage Contributors to Fall Injury Risk  Flowsheets (Taken 01/20/2019 0425)  Medication Review/Management:   medications reviewed   high risk medications identified  Self-Care Promotion:   independence encouraged   BADL personal objects within reach   BADL personal routines maintained  Intervention: Promote Injury-Free Environment  Flowsheets (Taken 01/20/2019 0425)  Safety Interventions:   fall reduction program maintained   lighting adjusted for tasks/safety   low bed   nonskid shoes/slippers when out of bed  Environmental Safety Modification:   assistive device/personal items within reach   clutter free environment maintained   lighting adjusted     Problem: Self-Care Deficit  Goal: Improved Ability to Complete Activities of Daily Living  Outcome: Progressing  Intervention: Promote Activity and Functional Independence  Flowsheets (Taken 01/20/2019 0425)  Activity Assistance Provided: assistance, 1 person  Self-Care Promotion:   independence encouraged   BADL personal objects within reach   BADL personal routines maintained     Problem: Wound  Goal: Optimal Wound Healing  Outcome: Progressing  Intervention: Promote Effective Wound Healing  Flowsheets (Taken 01/20/2019 0425)  Oral Nutrition Promotion: calorie dense foods provided  Pain Management Interventions:   care clustered   pain management plan reviewed with patient/caregiver   position adjusted   quiet environment facilitated  Sleep/Rest Enhancement:   awakenings minimized   regular sleep/rest pattern promoted     Problem: Skin Injury Risk Increased  Goal: Skin Health and Integrity  Outcome: Progressing  Intervention: Optimize Skin Protection  Flowsheets (Taken 01/20/2019 0425)  Pressure Reduction Techniques: frequent weight shift encouraged  Head of Bed (HOB): HOB at 30-45 degrees  Pressure Reduction Devices: pressure-redistributing mattress utilized  Skin Protection:   adhesive use limited   incontinence pads utilized   sacral silicone foam dressing in place   tubing/devices free from skin contact  Intervention: Promote and Optimize Oral Intake  Flowsheets (Taken 01/20/2019 0425)  Oral Nutrition Promotion: calorie dense foods provided

## 2019-01-21 DIAGNOSIS — G373 Acute transverse myelitis in demyelinating disease of central nervous system: Secondary | ICD-10-CM

## 2019-01-21 DIAGNOSIS — E162 Hypoglycemia, unspecified: Secondary | ICD-10-CM

## 2019-01-21 DIAGNOSIS — I96 Gangrene, not elsewhere classified: Secondary | ICD-10-CM

## 2019-01-21 DIAGNOSIS — B351 Tinea unguium: Secondary | ICD-10-CM

## 2019-01-21 DIAGNOSIS — K592 Neurogenic bowel, not elsewhere classified: Secondary | ICD-10-CM

## 2019-01-21 DIAGNOSIS — B20 Human immunodeficiency virus [HIV] disease: Secondary | ICD-10-CM

## 2019-01-21 DIAGNOSIS — G834 Cauda equina syndrome: Secondary | ICD-10-CM

## 2019-01-21 DIAGNOSIS — Z89022 Acquired absence of left finger(s): Secondary | ICD-10-CM

## 2019-01-21 DIAGNOSIS — R339 Retention of urine, unspecified: Secondary | ICD-10-CM

## 2019-01-21 DIAGNOSIS — E876 Hypokalemia: Secondary | ICD-10-CM

## 2019-01-21 DIAGNOSIS — B353 Tinea pedis: Secondary | ICD-10-CM

## 2019-01-21 DIAGNOSIS — U071 COVID-19: Secondary | ICD-10-CM

## 2019-01-21 DIAGNOSIS — B5889 Toxoplasmosis with other organ involvement: Secondary | ICD-10-CM

## 2019-01-21 DIAGNOSIS — E785 Hyperlipidemia, unspecified: Secondary | ICD-10-CM

## 2019-01-21 DIAGNOSIS — G4700 Insomnia, unspecified: Secondary | ICD-10-CM

## 2019-01-21 DIAGNOSIS — R32 Unspecified urinary incontinence: Secondary | ICD-10-CM

## 2019-01-21 DIAGNOSIS — R202 Paresthesia of skin: Secondary | ICD-10-CM

## 2019-01-21 DIAGNOSIS — G8220 Paraplegia, unspecified: Secondary | ICD-10-CM

## 2019-01-21 DIAGNOSIS — B789 Strongyloidiasis, unspecified: Secondary | ICD-10-CM

## 2019-01-21 DIAGNOSIS — D649 Anemia, unspecified: Secondary | ICD-10-CM

## 2019-01-21 DIAGNOSIS — D721 Eosinophilia, unspecified: Secondary | ICD-10-CM

## 2019-01-21 DIAGNOSIS — I70203 Unspecified atherosclerosis of native arteries of extremities, bilateral legs: Secondary | ICD-10-CM

## 2019-01-21 DIAGNOSIS — B370 Candidal stomatitis: Secondary | ICD-10-CM

## 2019-01-21 NOTE — Unmapped (Signed)
Infectious Disease ([facility]K) Progress Note      Subjective and 24-hour Events:     Ready to start therapy in AIR. No complaints.     Assessment & Plan:   Don [name]??is a 43 y.o.??male??with??previously untreated HIV/AIDS??(CD4 142 on admission) who initially presented with dry gangrene to the 2nd and 3rd digits of L hand and pain/weakness/discoloration over his lower extremities, subsequently found to be COVID??+ without respiratory difficulties (now off precautions). Hospital course complicated by cauda equina leading to LE paralysis with urinary retention, found to have patchy hyperintensities in the spine on MRI c/w transverse myelitis.  ??  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 Repeat imaging shows decreasing size of lesions s/p initiation of toxo treatment with sulfadiazine, pyrimethamine and leucovorin. Patient still with significant lower extremity neurological/motor inability. Failed spontaneous void trial 9/24, foley replaced.   - Pending AIR bed placement, later this week? (does NOT req repeat COVID test)  - Sulfadiazine, pyrimethamine, and leucovorin for 6 weeks (8/24 - 10/5), then decrease to chronic/maintenance dosing vs bactrim?    HIV/AIDS   Diagnosed in 2007 and was treatment naive on admission (had previously declined ART due to religious reasons).??His CD4 count was 142 and VL was 341,000 (8/20). Started Tivacay/Descovy 8/26.??Most recent VL 117 on 9/16.  - Plan for switch to [facility] on discharge. Dr. Dolores Frame has initiated process to coordinate outpatient follow-up and drug assistance enrollment.  - Will need bactrim prophylaxis for PJP (hold for now)   - Continue nystatin swishes for oral thrush  ????????????????????  Penile Ulcers - HSV2 (resolved)  Penile lesions identified by nurse on 9/16. 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 Urine GC/CT negative. S/p 10 days Valtrex.     Dry gangrene L hand (improving)    Etiology remains unclear, likely multifactorial. Bilateral UE arterial PVLs without any HD-significant findings and blood cultures obtained in the ED were negative. Has had extensive work up including multispecialty consultations including rheumatology, vascular surgery, hematology. Likely early atherosclerotic disease and recommended baby aspirin and agree with starting a statin (see ASCVD risk, below).   - Outpatient follow-up in hand clinic   - bandage changes daily   ??  Tinea pedis and onychomycosis  Trichophyton confirmed on biopsy.   - Griseofulvin for 6 months total (9/16 - )  - Continue econazole cream to bilateral feet BID    ASCVD Risk  Pooled Cohort 10-yr risk is 3%, however, HIV is an independent risk factor for CVD which is not included in the risk score. Furthermore, he already has significant atherosclerotic disease in his lower extremities and dyslipidemia. Given this, we feel it is appropriate to start him on a statin. A1c 5.5.   - continue atorvastatin 20mg  qd  - continue aspirin 81mg  qd  ??  Eosinophilia and strongyloidiasis (resolved)  He has had eosinophilia (900 to 1000) as far back as 10/2010, per record review; this remains stable at this time. His??stool O&P, GIPP were??negative.??Strongyloid Ab??positive on??8/21. S/p 2nd course of ivermectin completed 9/13.   ??  COVID-19 infection (resolved)  Asymptomatic. Completed 5 days remdesivir.??  Date of positive SARS-CoV-2 PCR: 12/09/2018 at OSH. Patient was 21 days from positive PCR on 9/9 and precautions discontinued.  ??    Insomnia  - Melatonin 3mg  qhs  - Trazodone 50mg  daily prn (Not home medication)      Objective:   Temp:  [36.5 ??C-36.8 ??C] 36.8 ??C  Heart Rate:  [77-86] 77  Resp:  [17-18] 17  BP: (96-122)/(65-77) 122/77  SpO2:  [99 %-100 %] 99 %    General: No acute distress, alert, oriented, answers questions appropriately   Heart: RRR  Lungs: Respirations unlabored on room air. Clear to auscultation bilaterally   Abdomen: Normoactive bowel sounds, soft, non-tender, non-distended, no rebound/guarding  Neuro: 0/5 strength in bilateral LE    Labs/Studies: Labs and studies reviewed and pertinent information is addressed in the plan

## 2019-01-21 NOTE — Unmapped (Signed)
Patient is alert and oriented  x4, afebrile. Continue with po antibiotic therapy. He is incontinent of bowels, foley intact patent. Turned and reposition every two hours and as needed. No falls. Will continue with POC.  Problem: Adult Inpatient Plan of Care  Goal: Plan of Care Review  Outcome: Progressing     Problem: Adult Inpatient Plan of Care  Goal: Patient-Specific Goal (Individualization)  Outcome: Progressing     Problem: Adult Inpatient Plan of Care  Goal: Absence of Hospital-Acquired Illness or Injury  Outcome: Progressing     Problem: Adult Inpatient Plan of Care  Goal: Optimal Comfort and Wellbeing  Outcome: Progressing     Problem: Adult Inpatient Plan of Care  Goal: Readiness for Transition of Care  Outcome: Progressing     Problem: Infection  Goal: Infection Symptom Resolution  Outcome: Progressing     Problem: Fall Injury Risk  Goal: Absence of Fall and Fall-Related Injury  Outcome: Progressing     Problem: Self-Care Deficit  Goal: Improved Ability to Complete Activities of Daily Living  Outcome: Progressing     Problem: Wound  Goal: Optimal Wound Healing  Outcome: Progressing     Problem: Skin Injury Risk Increased  Goal: Skin Health and Integrity  Outcome: Progressing

## 2019-01-21 NOTE — Unmapped (Signed)
Pt VSS, NAD, pt I&O have been sufficient,  pt denies any pain, taking all meds, will continue with POC.      Problem: Adult Inpatient Plan of Care  Goal: Plan of Care Review  Outcome: Progressing  Goal: Patient-Specific Goal (Individualization)  Outcome: Progressing  Goal: Absence of Hospital-Acquired Illness or Injury  Outcome: Progressing  Goal: Optimal Comfort and Wellbeing  Outcome: Progressing  Goal: Readiness for Transition of Care  Outcome: Progressing  Goal: Rounds/Family Conference  Outcome: Progressing     Problem: Infection  Goal: Infection Symptom Resolution  Outcome: Progressing     Problem: Fall Injury Risk  Goal: Absence of Fall and Fall-Related Injury  Outcome: Progressing     Problem: Self-Care Deficit  Goal: Improved Ability to Complete Activities of Daily Living  Outcome: Progressing     Problem: Wound  Goal: Optimal Wound Healing  Outcome: Progressing     Problem: Skin Injury Risk Increased  Goal: Skin Health and Integrity  Outcome: Progressing

## 2019-01-21 NOTE — Unmapped (Addendum)
WOCN Consult Services                                                                 Wound Evaluation     Reason for Consult:   - Follow-up  - Wound    Problem List:   Principal Problem:    Gangrene (CMS-HCC)  Active Problems:    Thrush    COVID-19 virus infection    History of dental problems    Lower extremity edema    HIV (human immunodeficiency virus infection) (CMS-HCC)    Bilateral leg weakness    Urinary retention with bladder stretch injury    Assessment: Don [name]??is a 43 y.o.??male??with??previously untreated HIV/AIDS??(CD4 142 on admission) who initially presented with dry gangrene to the 2nd and 3rd digits of L hand and pain/weakness/discoloration over his lower extremities, subsequently found to be COVID??+ without respiratory difficulties (now off precautions), hospital course complicated by cauda equina syndrome leading to LE paralysis with urinary retention, found to have patchy hyperintensities in the spine and brain on MRI c/w transverse myelitis. Workup notable for Toxoplasma and EBV in his CSF, as well as CMV+ low level in the peripheral blood but negative in CSF. He was most recently found to have significant diffuse??lymphadenopathy on PET scan concerning for lymphoma and is now s/p left axillary excisional lymph node biopsy on 9/10, which was negative for malignancy and organisms.     Consult received for biopsy left foot not healing well after 1 month. Per MD note, biopsy was done on dorsum of left foot; Note states that scab from his biopsy site came off and started bleeding and does not appear to be healing well.    Pt has a small open area on the outer dorsal foot which according to chart is an old nonhealing biopsy site which began bleeding when top was removed. It is approximately 0.3cmx0.3cmx0.1cm with a pink wound bed. It is not bleeding at this time and does not appear infected.  Wound was cleansed with saline and a foam dressing was applied. Will order this dressing to be changed every other day      Lab Results   Component Value Date    WBC 5.9 01/20/2019    HGB 12.4 (L) 01/20/2019    HCT 36.8 (L) 01/20/2019    ESR 61 (H) 01/03/2019    CRP 18.2 (H) 01/03/2019    A1C 5.5 01/04/2019    GLUF 99 11/05/2010    GLU 98 01/20/2019    POCGLU 97 12/14/2018    ALBUMIN 3.8 01/08/2019    PROT 7.7 01/08/2019     Support Surface:   - Low Air Loss    Offloading:  Left: Heel Offloading Boot  Right: Heel Offloading Boot    Type Debridement Completed By WOCN:  N/A    Teaching:  - Elevation of extremity  - Wound care    WOCN Recommendations:   - See nursing orders for wound care instructions.  - Contact WOCN with questions, concerns, or wound deterioration.    Topical Therapy/Interventions:   - Foam    Recommended Consults:  - Not Applicable    WOCN Follow Up:  - Weekly    Plan of Care Discussed With:   - Patient  - RN Primary  RN    Supplies Ordered: No    Workup Time:   60 minutes     Tonny Bollman, BSN, RN, [facility]  Wound Ostomy Consult Service  Pager [phone]

## 2019-01-21 NOTE — Unmapped (Signed)
Problem: Adult Inpatient Plan of Care  Goal: Plan of Care Review  Outcome: Progressing  Goal: Patient-Specific Goal (Individualization)  Outcome: Progressing  Goal: Absence of Hospital-Acquired Illness or Injury  Outcome: Progressing  Goal: Optimal Comfort and Wellbeing  Outcome: Progressing  Goal: Readiness for Transition of Care  Outcome: Progressing  Goal: Rounds/Family Conference  Outcome: Progressing     Problem: Infection  Goal: Infection Symptom Resolution  Outcome: Progressing     Problem: Fall Injury Risk  Goal: Absence of Fall and Fall-Related Injury  Outcome: Progressing     Problem: Self-Care Deficit  Goal: Improved Ability to Complete Activities of Daily Living  Outcome: Progressing     Problem: Wound  Goal: Optimal Wound Healing  Outcome: Progressing     Problem: Skin Injury Risk Increased  Goal: Skin Health and Integrity  Outcome: Progressing   Patient resting in bed, on phone with family at the beginning of shift.  Request for pain medications once this evening, dressing in tact, foley patent and UO adequate, dark amber in color. Encourage IS, oral intake and bilateral off loading boots in place and will continue to monitor.

## 2019-01-22 ENCOUNTER — Ambulatory Visit
Admission: TF | Admit: 2019-01-22 | Discharge: 2019-02-18 | Disposition: A | Discharge status: Home Health | Payer: BLUE CROSS/BLUE SHIELD | Source: Intra-hospital | Admitting: Spinal Cord Injury Medicine

## 2019-01-22 ENCOUNTER — Ambulatory Visit
Admission: TF | Admit: 2019-01-22 | Discharge: 2019-02-18 | Disposition: A | Discharge status: Home Health | Payer: PRIVATE HEALTH INSURANCE | Source: Intra-hospital | Admitting: Spinal Cord Injury Medicine

## 2019-01-22 DIAGNOSIS — B20 Human immunodeficiency virus [HIV] disease: Secondary | ICD-10-CM

## 2019-01-22 DIAGNOSIS — K592 Neurogenic bowel, not elsewhere classified: Secondary | ICD-10-CM

## 2019-01-22 DIAGNOSIS — R339 Retention of urine, unspecified: Secondary | ICD-10-CM

## 2019-01-22 DIAGNOSIS — I96 Gangrene, not elsewhere classified: Secondary | ICD-10-CM

## 2019-01-22 DIAGNOSIS — G834 Cauda equina syndrome: Secondary | ICD-10-CM

## 2019-01-22 DIAGNOSIS — B370 Candidal stomatitis: Secondary | ICD-10-CM

## 2019-01-22 DIAGNOSIS — B789 Strongyloidiasis, unspecified: Secondary | ICD-10-CM

## 2019-01-22 DIAGNOSIS — R32 Unspecified urinary incontinence: Secondary | ICD-10-CM

## 2019-01-22 DIAGNOSIS — G373 Acute transverse myelitis in demyelinating disease of central nervous system: Secondary | ICD-10-CM

## 2019-01-22 DIAGNOSIS — G8220 Paraplegia, unspecified: Secondary | ICD-10-CM

## 2019-01-22 DIAGNOSIS — B5889 Toxoplasmosis with other organ involvement: Secondary | ICD-10-CM

## 2019-01-22 DIAGNOSIS — B351 Tinea unguium: Secondary | ICD-10-CM

## 2019-01-22 DIAGNOSIS — G4700 Insomnia, unspecified: Secondary | ICD-10-CM

## 2019-01-22 DIAGNOSIS — D721 Eosinophilia, unspecified: Secondary | ICD-10-CM

## 2019-01-22 DIAGNOSIS — Z89022 Acquired absence of left finger(s): Secondary | ICD-10-CM

## 2019-01-22 DIAGNOSIS — R202 Paresthesia of skin: Secondary | ICD-10-CM

## 2019-01-22 DIAGNOSIS — E785 Hyperlipidemia, unspecified: Secondary | ICD-10-CM

## 2019-01-22 DIAGNOSIS — B353 Tinea pedis: Secondary | ICD-10-CM

## 2019-01-22 DIAGNOSIS — D649 Anemia, unspecified: Secondary | ICD-10-CM

## 2019-01-22 DIAGNOSIS — I70203 Unspecified atherosclerosis of native arteries of extremities, bilateral legs: Secondary | ICD-10-CM

## 2019-01-22 DIAGNOSIS — E162 Hypoglycemia, unspecified: Secondary | ICD-10-CM

## 2019-01-22 DIAGNOSIS — U071 COVID-19: Secondary | ICD-10-CM

## 2019-01-22 DIAGNOSIS — E876 Hypokalemia: Secondary | ICD-10-CM

## 2019-01-22 LAB — URINALYSIS
BILIRUBIN UA: NEGATIVE
GLUCOSE UA: NEGATIVE
HYALINE CASTS: <1 /LPF (ref 0–1)
KETONES UA: NEGATIVE
NITRITE UA: NEGATIVE
PH UA: 6.5 (ref 5.0–9.0)
PROTEIN UA: 100 — AB
RBC UA: >100 /HPF — ABNORMAL HIGH (ref <=3)
SPECIFIC GRAVITY UA: 1.01 (ref 1.003–1.030)
SQUAMOUS EPITHELIAL: <1 /HPF (ref 0–5)
UROBILINOGEN UA: 0.2
WBC UA: >100 /HPF — ABNORMAL HIGH (ref <=2)

## 2019-01-22 LAB — PROTEIN UA: Protein:MCnc:Pt:Urine:Qn:Test strip: 100 — AB

## 2019-01-22 MED ORDER — MELATONIN 3 MG TABLET
ORAL_TABLET | Freq: Every evening | ORAL | 0 refills | 30 days | Status: SS
Start: 2019-01-22 — End: ?

## 2019-01-22 MED ORDER — ECONAZOLE 1 % TOPICAL CREAM
Freq: Two times a day (BID) | TOPICAL | 0 refills | 0 days | Status: SS
Start: 2019-01-22 — End: 2020-01-22

## 2019-01-22 MED ORDER — MAGNESIUM OXIDE 400 MG (241.3 MG MAGNESIUM) TABLET
ORAL_TABLET | Freq: Every day | ORAL | 0 refills | 30.00000 days | Status: SS
Start: 2019-01-22 — End: 2020-01-22

## 2019-01-22 MED ORDER — SULFADIAZINE 500 MG TABLET
ORAL_TABLET | ORAL | 0 refills | 184 days | Status: SS
Start: 2019-01-22 — End: 2019-07-25

## 2019-01-22 MED ORDER — ONDANSETRON 8 MG DISINTEGRATING TABLET
Freq: Three times a day (TID) | ORAL | 0 refills | 0 days | Status: SS | PRN
Start: 2019-01-22 — End: 2019-01-29

## 2019-01-22 MED ORDER — ASPIRIN 81 MG CHEWABLE TABLET
ORAL_TABLET | Freq: Every day | ORAL | 0 refills | 30.00000 days | Status: SS
Start: 2019-01-22 — End: ?

## 2019-01-22 MED ORDER — ALUMINUM-MAG HYDROXIDE-SIMETHICONE 400 MG-400 MG-40 MG/5 ML ORAL SUSP
ORAL | 0 refills | 2 days | Status: SS | PRN
Start: 2019-01-22 — End: ?

## 2019-01-22 MED ORDER — ATORVASTATIN 20 MG TABLET
ORAL_TABLET | Freq: Every day | ORAL | 0 refills | 30.00000 days | Status: SS
Start: 2019-01-22 — End: ?

## 2019-01-22 MED ORDER — GRISEOFULVIN ULTRAMICROSIZE 125 MG TABLET
Freq: Two times a day (BID) | ORAL | 0 refills | 0.00000 days | Status: SS
Start: 2019-01-22 — End: ?

## 2019-01-22 MED ORDER — BISACODYL 10 MG RECTAL SUPPOSITORY
Freq: Every day | RECTAL | 0 refills | 12.00000 days | Status: SS | PRN
Start: 2019-01-22 — End: 2019-02-21

## 2019-01-22 MED ORDER — GABAPENTIN 100 MG CAPSULE
ORAL_CAPSULE | Freq: Three times a day (TID) | ORAL | 0 refills | 30 days | Status: SS
Start: 2019-01-22 — End: ?

## 2019-01-22 NOTE — Unmapped (Signed)
Physician Discharge Summary [facility]  5 EST [facility]  [address]  [place] [address]  Dept: [phone]  Loc: [phone]     Identifying Information:   Don [name]  1976-02-13  [id number]    Primary Care Physician: No PCP Per Patient   Code Status: Full Code    Admit Date: 12/10/2018    Discharge Date: 01/22/2019     Discharge To: Acute Inpatient Rehab    Discharge Service: [facility] - MDK - ID Admitting     Discharge Attending Physician: Leane Platt, MD    Discharge Diagnoses:  Principal Problem:    Gangrene (CMS-HCC) POA: Yes  Active Problems:    Thrush POA: Yes    COVID-19 virus infection POA: Yes    History of dental problems POA: Yes    Lower extremity edema POA: Yes    HIV (human immunodeficiency virus infection) (CMS-HCC) POA: Yes    Bilateral leg weakness POA: Yes    Urinary retention with bladder stretch injury POA: Yes  Resolved Problems:    * No resolved hospital problems. 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 A summary of his hospital stay by problem is listed below:     Cauda Equina syndrome:   Gradually worsening LE weakness/numbness??progressing to lower extremity paralysis 8/22, also urinary retention requiring foley catheter. MRI spine notable for multiple spinal cord lesions (increased T2??signal from T10??to conus medullaris). MRI brain with single enhancing brain lesion to the right corona radiata. He received empiric amphotericin 8/21-8/25, flucytosine 8/21-8/24. 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 LP 8/22 notable for elevated cells, lymphocytes, and protein, toxoplasmosis and EBV positive, negative cytology. Sulfadiazine, pyrimethamine, and leucovorin started 8/24 for toxoplasmosis. Repeat LP 8/25, again negative for malignant cells. CMV+ in the blood but not CSF. He received??ganciclovir 8/22-8/25.      He had a PET scan on 9/1 which revealed lymphadenopathy on concerning for lymphoma vs reactive process underwent left axillary excisional lymph node biopsy on 9/10.     Neurology consulted 9/11 and opined the since there had been no return to his leg function or sensation despite ongoing therapy for toxo, his neurologic deficits may be permanent. He received OT/PT starting 8/22.     Repeat brain MRI 9/21 demonstrated decreased size of the enhancing brain lesion, consistent with treatment-responsive toxoplasmosis and lowering our suspicion for CNS lymphoma.  ??  A spontaneous voiding trial was done on 9/24 and pt was unable to void on his own, so urology replaced the Foley. After discussing all options with the patient, we will move forward with transfer to [facility] (order placed 9/22).   ????????????????????  Dry gangrene L hand    Per patient, noted discoloration following an extraction of multiple teeth. 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 Dermatology consulted and felt that this  was more suggestive of a macro-occlusive process (embolic versus coagulopathic versus vascular). Vascular surgery consulted and felt it was not due to occlusion since bilateral UE arterial PVLs were without any significant findings. TTE 8/20 showed no obvious vegetations. He had blood cultures obtained on 12/10/2018 which remained??negative. Ortho was consulted and performed bedside resection of exposed bone on the long finger on 8/20; the dressings were changed q3d afterwards. Rheumatology consulted on 8/24 and 9/18 and did not suspect vasculitis or other rheumatologic etiology. Benign hematology consulted on 9/14 and, after a full thorough lab workup, did not find any evidence of thrombophilia which would require full dose anticoagulation.   ??  Discoloration/desquamation bilateral feet  Patient began noticing color changes in his feet about a year ago but believed that it was getting acutely worse in the days prior to admission with increased pain and swelling. Received heparin drip on 8/21 with concern for arterial occlusion. 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 Bilateral LE arterial PVLs demonstrated 50-74% stenosis identified in the right popliteal artery without waveform deterioration. ?? Vascular surgery was consulted and felt that, at this point, no acute intervention is likely needed from a surgical standpoint and the imaging findings would not explain the findings in his feet.??Skin biopsy showed cutaneous mycosis with marked neutrophilic scale crust consistent with dermatophyte infection. He started econazole cream on 8/21 with minimal improvement for 2 weeks. Oral itraconazole started 9/14.     Tinea pedis Scaly plaques on dorsal surface bilateral feet, s/p biopsy growing trichophyton species. Skin biopsy showed cutaneous mycosis with marked neutrophilic scale crust consistent with dermatophyte infection. CSF fungal culture negative.   - Continue econazole cream to bilateral feet BID (started 12/11/2018)  - PO itraconazole started 9/14  - If fails topical antifungals, can consider trial of topical steroids   - Systemic treatment with antifungals for onychomycosis can be pursued as an outpatient if desired  ??  Untreated HIV infection; AIDS  Diagnosed in 2007 and is treatment naive (he declined treatment then and in 2013 for religious reasons). His CD4 count was 142 and VL was 341,000 on admission. He started tivacay/descovy 8/26.??CD4 count 9/17 improved to 231. Plan for switch to [facility] on discharge. Dr. Dolores Frame has initiated process to coordinate outpatient follow-up and drug assistance enrollment. He received nystatin swishes for oral thrush.   - Will need bactrim prophylaxis for PJP (hold for now)  - Negative labs: serum parvovirus PCR, Quant gold  - Positive labs: serum CMV PCR, CSF EBV PCR, CSF Toxoplasma PCR  ??  Eosinophilia and strongyloidiasis   He has had eosinophilia (900 to 1000) as far back as 10/2010, per record review; this remains stable at this time. His??stool O&P, GIPP were??negative.??Strongyloid Ab??positive on??8/21. S/p 2 courses of ivermectin completed 9/13.   ??  Genital Herpes  Penile lesions identified by nurse on 9/16. Exam revealed 0.54mm and 0.75mm firm raised lesions with central ulceration under the dorsal foreskin. Pt thinks they appeared a week ago when he noticed some burning but was not sure. Denied pain, urethral dischange, hx of other penile lesions or STIs. No inguinal lymphadenopathy. 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 He was put on a 10 day course of valacyclovir 1000mg  bid. On 9/20 the lesions seemed to be improving, and on 9/24 they were almost completely resolved.    COVID-19 infection  Asymptomatic infection confirmed at OSH before transfer to [facility]. 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 Patient was 21 days from positive PCR on 9/9 and precautions were discontinued.   ??  Artificial hypoglycemia  He had a random POC glucose check on the morning of 12/12/2018 that was low at 50.??He was asymptomatic at the time and rebounded to 115 with consumption of juice. Over the course of the following 24 hours, he continued to have low BGs on POC checks, despite being completely asymptomatic and receiving dextrose-containing IV fluids. Blood glucose checked via venipuncture and found to be 340 post interventions (D50 and D10). Accu check re-performed via 1) venipuncture and 2) ear lobe with BG resulting as 340 and 321, respectively. He is to have future POC BG checks from his ear. ??  ??  Elevated aminotransferases: His AST and ALT increased on 12/12/2018 (142 and 94, respectively), one day after Remdesivir was initiated on 12/11/2018. Resolved on 9/1 and remained wnl for the rest of his stay.  ????????????????????????????????????????????????    Touchbase with Outpatient Provider:  Warm Handoff: Completed on 01/22/19 by Napoleon Form  (Resident) via Epic Secure Chat    Procedures:  lumbar puncture  No admission procedures for hospital encounter.  ______________________________________________________________________  Discharge Medications:     Your Medication List      START taking these medications    aluminum-magnesium hydroxide-simethicone 400-400-40 mg/5 mL suspension  Commonly known as: MAALOX MAX  Take 30 mL by mouth every four (4) hours as needed.     aspirin 81 MG chewable tablet  Chew 1 tablet (81 mg total) daily.     atorvastatin 20 MG tablet  Commonly known as: LIPITOR  Take 1 tablet (20 mg total) by mouth daily.     BIKTARVY 50-200-25 mg tablet  Generic drug: bictegrav-emtricit-tenofov ala  Tome una tableta por v??a oral diariamente.  (Take 1 tablet by mouth daily.)     bisacodyL 10 mg suppository  Commonly known as: DULCOLAX  Insert 1 suppository (10 mg total) into the rectum daily as needed. econazole nitrate 1 % cream  Commonly known as: SPECTAZOLE  Apply topically Two (2) times a day.     gabapentin 100 MG capsule  Commonly known as: NEURONTIN  Take 1 capsule (100 mg total) by mouth Three (3) times a day.     griseofulvin 125 MG tablet  Commonly known as: GRIS-PEG  Take 3 tablets (375 mg total) by mouth Two (2) times a day.     leucovorin 10 MG tablet  Commonly known as: WELLCOVORIN  Take 1 tablet (10 mg total) by mouth daily.  Start taking on: January 23, 2019     magnesium oxide 400 mg (241.3 mg magnesium) tablet  Commonly known as: MAG-OX  Take 1 tablet (400 mg total) by mouth daily.     melatonin 3 mg Tab  Take 1 tablet (3 mg total) by mouth every evening.     ondansetron 8 MG disintegrating tablet  Commonly known as: ZOFRAN-ODT  Take 1 tablet (8 mg total) by mouth every eight (8) hours as needed for up to 7 days.     pyrimethamine 25 mg tablet  Commonly known as:  DARAPRIM  Take 3 tablets (75 mg total) by mouth daily.     sulfaDIAZINE 500 mg tablet  Take 3 tablets (1,500 mg total) by mouth Every six (6) hours for 4 days, THEN 3 tablets (1,500 mg total) Two (2) times a day.  Start taking on: January 22, 2019            Allergies:  Patient has no known allergies.  ______________________________________________________________________  Pending Test Results (if blank, then none):  Pending Labs     Order Current Status    Arbovirus Antibodies, CSF In process    Arbovirus Antibodies, Serum In process    AFB culture Preliminary result    AFB culture Preliminary result          Most Recent Labs:  All lab results last 24 hours - No results found for this or any previous visit (from the past 24 hour(s)).    Relevant Studies/Radiology (if blank, then none):  Xr Chest Portable    Result Date: 12/10/2018  EXAM: XR CHEST PORTABLE DATE: 12/10/2018 ACCESSION: [id number] UN DICTATED: 12/10/2018 8:57 AM [facility]PRET[facility] CLINICAL INDICATION: 43 years old Male with PNEUMONIA  COMPARISON: None TECHNIQUE: Portable Chest Radiograph. FINDINGS: Lungs radiographically clear. No pleural effusion or pneumothorax. Unremarkable cardiomediastinal silhouette.     No acute airspace disease    Xr Hand 3 Or More Views Left    Result Date: 12/10/2018  EXAM: XR HAND 3 OR MORE VIEWS LEFT DATE: 12/10/2018 ACCESSION: [id number] UN DICTATED: 12/10/2018 9:03 AM INTERPRETATION [facility] CLINICAL INDICATION: 43 years old Male with dry gangrene  COMPARISON: None TECHNIQUE: AP, oblique and lateral views of left hand. 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 Periarticular osteoporosis of the MCP and PIP joints. The other bones appears intact.     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     Xr Foot 3 Or More Views Bilateral    Result Date: 12/10/2018  EXAM: XR FOOT 3 OR MORE VIEWS BILATERAL DATE: 12/10/2018 8:45 AM ACCESSION: [id number] UN DICTATED: 12/10/2018 8:57 AM INTERPRETATION [facility]ATION: [facility] CLINICAL INDICATION: 43 years old Male with Eval for osteo  COMPARISON: None. TECHNIQUE: Dorsoplantar, oblique and lateral views of the feet. FINDINGS: Right foot: No acute fracture. Joint alignment is anatomic. Moderate subchondral sclerosis at the first MTP joint. No osseous erosions. Tiny os navicularis. Mild soft tissue swelling over the forefoot. No subcutaneous gas. Vascular calcifications. Left foot: No acute fracture. Os navicularis. Joint alignment is anatomic. Joint spaces are normal. Moderate subchondral sclerosis at the first MTP joint. Mild subchondral sclerosis of the subtalar joint and calcaneonavicular joints. No osseous erosions. Well corticated large osseous body in the medial soft tissues of the hindfoot, likely accessory ossicle. Mild soft tissue swelling over the forefoot. No subcutaneous gas. Vascular calcifications. Moderate soft tissue swelling about the bilateral feet without underlying osseous abnormality or evidence for gas forming infection.     Mri Brain W Wo Contrast    Result Date: 01/11/2019  EXAM: Magnetic resonance imaging, brain, without and with contrast material. DATE: 01/11/2019 9:07 PM ACCESSION: [id number] UN DICTATED: 01/11/2019 9:38 PM INTERPRETATION LOCATION: [facility] CLINICAL INDICATION: 43 years old Male with F/u enhancing brain lesion. Previously untreated HIV/AIDS. COMPARISON: MRI brain 12/18/2018. TECHNIQUE: Multiplanar, multisequence MR imaging of the brain was performed without and with I.V. contrast. FINDINGS:  Previous  enhancing lesion in the right corona radiata has decreased in size, now measuring approximately 4 x 2 mm on the axial images compared to 7 x 5 mm previously. The extent of associated FLAIR signal abnormality appears mildly increased, which could represent associated edema and/or gliosis. No evidence of new lesions. No midline shift. The basal cisterns are patent. The major vascular flow-voids are present. Decreased retention cyst in the left maxillary sinus. Minimal mucosal thickening in the ethmoid sinuses. Orbits are within normal limits. There is leftward deviation of the nasal septum with prominent osseous spur.     Decreased size of enhancing lesion in the right corona radiata, with slightly increased surrounding edema and/or gliosis.    Mri Brain W Wo Contrast    Result Date: 12/19/2018  EXAM: Magnetic resonance imaging, brain, without and with contrast material. DATE: 12/18/2018 10:41 PM ACCESSION: [id number] UN DICTATED: 12/18/2018 11:13 PM INTERPRETATION [facility] CLINICAL INDICATION: 43 years old Male with brain lesion corona radiata.  COMPARISON: Concurrent MRI cervical spine, MRI brain 12/11/2018 TECHNIQUE: Multiplanar, multisequence MR imaging of the brain was performed without and with I.V. contrast. FINDINGS:  Similar subcentimeter focus of T2/FLAIR hyperintensity within the right corona radiata with associated enhancement (15:112). There is similar associated increased signal on diffusion-weighted images, without definite abnormality on ADC images. Ventricles are normal in size. There is no midline shift. No extra-axial fluid collection. No evidence of intracranial hemorrhage. No mass. Complete opacification of the left maxillary sinus.     Stable single enhancing lesion within the right corona radiata remains a nonspecific finding. No new lesions are identified.    Mri Brain W Wo Contrast    Result Date: 12/11/2018  EXAM: Magnetic resonance imaging, brain, without and with contrast material. DATE: 12/11/2018 6:37 PM ACCESSION: [id number] UN DICTATED: 12/11/2018 6:44 PM INTERPRETATION [facility]ATION: [facility] CLINICAL INDICATION: 43 years old Male with [name] is a 43 y/o with longstanding untreated HIV infection, COVID with acute change in LE weakness, urinary retention with saddle aneathesia  COMPARISON: Same-day total spine MRI. TECHNIQUE: Multiplanar, multisequence MR imaging of the brain was performed without and with I.V. contrast. FINDINGS:  There is a subcentimeter focus of T2/FLAIR hyperintensity within the right corona radiata with associated enhancement (16:16, 52:118). Associated increased signal on diffusion-weighted images. There is no definite abnormality on ADC images which may represent pseudonormalization in the setting of subacute infarct. No evidence of intracranial hemorrhage. Ventricles are normal in size. There is no midline shift. No extra-axial fluid collection. Complete opacification of the left maxillary sinus.     - T2/FLAIR hyperintense focus in the right corona radiata with associated enhancement, indeterminate. ==================== ADDENDUM (12/11/2018 8:32 PM): On review, the following additional findings were noted: The above-described findings are indeterminate in the setting of untreated HIV and may represent sequelae of fungal infection. Infarct is not favored.    Cta Abdominal Aorta And Bilateral Iliofemoral Runoff W Wo Contrast    Result Date: 12/10/2018  EXAM: CTA aorta and iliofemoral runoff DATE: 12/10/2018 4:08 PM ACCESSION: [id number] UN DICTATED: 12/10/2018 4:18 PM [facility]PRETATION [facility] CLINICAL INDICATION: 43 years old Male with eval for peripheral arterial disease  COMPARISON: CXR 12/10/2018. Same-day lumbar spine CT. TECHNIQUE: A spiral CTA scan was obtained with IV contrast from the top of the kidneys through the toes.  Images were reconstructed in the axial plane.  Multiplanar reformatted and MIP images were provided for further evaluation of the vessels.  For selected cases, 3D volume rendered images are also provided. Findings: VASCULAR: Aorta:  Normal in caliber and patent with mild atherosclerotic calcification of the distal abdominal aorta. No evidence of dissection, intramural hematoma, or aneurysm. Celiac artery: Patent Superior mesenteric artery: Patent Inferior mesenteric artery: Patent Renal arteries: Single bilateral renal arteries are patent Right lower extremity: Common iliac artery: Patent with mild atherosclerotic calcification External iliac artery: Patent Internal iliac artery: Patent with mild atherosclerotic calcification Common femoral artery: Patent Profunda femoral artery: Moderate to severe focal stenosis along its midportion (4:136), though it remains patent. Superficial femoral artery and popliteal artery: Multifocal moderate to severe narrowing of the distal superficial femoral artery (4:229) and the proximal popliteal artery (4:235), though they remain patent. Anterior tibial artery: Patent at the level of the ankle Peroneal tibial trunk: Patent Peroneal artery: Short segment occlusion at the level of the distal lower leg (4:329), with distal reconstitution. Posterior tibial artery: Patent extending into the foot Left lower extremity: Common iliac artery: Patent External iliac artery: Patent Internal iliac artery: Patent Common femoral artery: Patent Profunda femoral artery: Patent Superficial femoral artery: Patent with mild atherosclerotic calcification distally Popliteal artery: Patent with mild atherosclerotic calcification Anterior tibial artery: Patent at the level of the ankle Peroneal tibial trunk: Patent Peroneal artery: Occluded at the level of the midcalf, with distal reconstitution. Posterior tibial artery: Patent extending into the foot NONVASCULAR FINDINGS: LOWER THORAX: Mild bibasilar dependent atelectasis. HEPATOBILIARY: Hypoattenuation along the falciform ligament, likely focal steatosis (4:21). Otherwise, no focal hepatic lesion in the visualized liver (the hepatic dome is excluded from field-of-view). The gallbladder is present and otherwise unremarkable. No biliary dilatation.  SPLEEN: Unremarkable. PANCREAS: Unremarkable. ADRENALS: Unremarkable. KIDNEYS/URETERS: Symmetric nephrograms. No hydronephrosis. BLADDER: Distended urinary bladder. No focal bladder wall thickening. PELVIC/REPRODUCTIVE ORGANS: Calcifications within the prostate, nonspecific. GI TRACT: No dilated or thick walled loops of bowel. No evidence of appendiceal inflammation. PERITONEUM/RETROPERITONEUM AND MESENTERY: No free air or fluid. LYMPH NODES: Borderline enlarged bilateral external iliac nodes, measuring up to 1.0 cm on the left (4:2) and 1.2 similar on the right (4:110), and numerous prominent but nonenlarged bilateral inguinal nodes, likely reactive. VESSELS: The portal venous system is patent. The hepatic veins and IVC are unremarkable. BONES AND SOFT TISSUES: No acute osseous abnormality. Small fat-containing left inguinal hernia.     --Moderate to severe focal stenosis of the right profunda femoral artery, distal right superficial femoral artery, and proximal right popliteal artery, though they appear to remain patent. There is short segment occlusion of the right peroneal artery, with distal reconstitution. The right anterior tibial and posterior tibial arteries are patent. --Mild atherosclerotic disease of the left lower extremity with patent femoral and popliteal arteries and patent two-vessel runoff. Occlusion of the left peroneal artery at the level of the distal lower leg.    Ct Chest High Resolution Wo Contrast    Result Date: 12/14/2018  EXAM: CT Chest High Resolution without contrast DATE: 12/14/2018 3:42 PM ACCESSION: [id number] UN DICTATED: 12/14/2018 4:15 PM [facility]PRETATION [facility] CLINICAL INDICATION: 43 years old Male with Sarcoidosis  COMPARISON: CXR 12/10/2018 TECHNIQUE: Non Contrast High resolution CT Chest was performed with inspiratory, expiratory, and prone series. FINDINGS: AIRWAYS, LUNGS, PLEURA: Clear central tracheobronchial tree.      Left upper lobe calcified granuloma. No pleural effusion. Bibasilar subsegmental atelectasis. No peripheral reticulation. No traction bronchiectasis. No honeycombing. No evidence for air trapping on expiratory phase imaging. MEDIASTINUM: Right PICC tip is in the proximal right atrium. Normal heart size.  No pericardial effusion.   Normal caliber thoracic aorta.  No mediastinal lymphadenopathy. 0.8 cm  right hilar lymph node. IMAGED ABDOMEN: Hyperdensity debris within the stomach likely pills. SOFT TISSUES: Mild body wall edema.Bilateral axillary lymphadenopathy measuring up to 1 cm. BONES: Unremarkable.     Bibasilar subsegmental atelectasis. No CT evidence of fibrotic lung disease or sarcoidosis. Mild anasarca; may represent underlying volume overload status. Bilateral axillary lymphadenopathy measuring up to 1 cm. ;indeterminate but likely reactive.         Mri Cervical Thoracic Lumbar Spine W Wo Contrast    Result Date: 12/19/2018  EXAM: Magnetic resonance imaging, spine, without and with contrast material, cervical, thoracic and lumbar. DATE: 12/18/2018 10:41 PM ACCESSION: [id number] UN DICTATED: 12/19/2018 12:38 AM [facility]PRETATION [facility] CLINICAL INDICATION: 43 years old Male with re - evaluate toxoplasmosis infection  COMPARISON: MRI cervical thoracic and lumbar spine 12/11/2018 TECHNIQUE: Multiplanar MRI was performed through the cervical, thoracic, and lumbar spine prior to and following intravenous contrast administration. FINDINGS: Bone marrow signal intensity is normal. The vertebral bodies are normally aligned. Vertebral body heights are maintained. Disc spaces are preserved.  Similar to prior, there is increased T2 signal within the gray matter tracks of the spinal cord extending from the level of T10 to conus medullaris. Additional focal T2 hyperintensity in the dorsal columns at the level of T10 is unchanged (116:62). There is persistent focal enhancement of the thoracic spinal cord at the level of T12 (115:7). CERVICAL: Posterior disc osteophyte complexes are present at C3-C4 and C4-C5 without significant spinal canal narrowing. Disc osteophyte complex at C5-C6 with mild spinal canal narrowing. No significant neural foraminal narrowing. THORACIC: Posterior disc bulges at T5-T6, T6-T7, and T7-T8 without significant spinal canal narrowing. No significant neural foraminal narrowing. LUMBAR: At L4-L5 there is a posterior disc bulge with bilateral facet arthrosis and bilateral facet joint effusions, without significant spinal canal narrowing. There is mild neural foraminal narrowing on the right at L4-L5. At L5-S1, there is a posterior disc bulge with superimposed left foraminal disc protrusion and bilateral facet arthrosis, resulting in moderate right and severe left neural foraminal narrowing. No significant spinal canal stenosis. The paraspinal tissues are within normal limits. Trace bilateral pleural effusions.     Compared to 12/11/2018, there has been little interval change, with persistent increased T2 signal in the spinal cord from the level of T10 to the conus medullaris, predominantly involving the central portions of the cord, as well as focal T2 hyperintensity within the cord at T10 and cord enhancement at T12. Multilevel degenerative changes are similar to prior as described above. Disc bulge and facet arthrosis at L5-S1 results in severe left and moderate right neural foraminal narrowing.    Mri Cervical Thoracic Lumbar Spine W Wo Contrast    Result Date: 12/11/2018  EXAM: Magnetic resonance imaging, spine, without and with contrast material, cervical, thoracic and lumbar. DATE: 12/11/2018 6:37 PM ACCESSION: [id number] UN DICTATED: 12/11/2018 6:57 PM INTERPRETATION LOCATION: [facility] CLINICAL INDICATION: 42 years old Male with [name] is a 43 y/o with longstanding untreated HIV infection, COVID with acute change in LE weakness, urinary retention with saddle aneathesia  COMPARISON: Same day MRI brain. TECHNIQUE: Multiplanar MRI was performed through the cervical, thoracic, and lumbar spine prior to and following intravenous contrast administration. FINDINGS: Bone marrow signal intensity is normal. The vertebral bodies are normally aligned. Vertebral body heights are maintained. Disc spaces are preserved. There is increased T2 signal within the gray matter tracts of the spinal cord extending from the level of T10 to the conus medullaris. Additional focal T2 hyperintensity in the dorsal columns  at the level of T10 (19:16). There is focal enhancement of the thoracic spinal cord at the level of T12 primarily involving the gray matter tracts. CERVICAL: Posterior disc osteophyte complexes at C3-4 and C4-5 without significant spinal canal narrowing. Disc osteophyte complex at C5-6 with mild spinal canal narrowing. No significant neural foraminal narrowing. THORACIC: Disc bulges at T5-6, T6-7, and T7-8 without significant spinal canal narrowing. No significant neural foraminal narrowing. LUMBAR: Disc bulge at L4-5 with bilateral facet arthrosis and bilateral facet joint effusions without significant spinal canal narrowing. There is mild neural foraminal narrowing on the right at L4-5. Disc bulge with superimposed left foraminal/extraforaminal disc protrusion and bilateral facet arthrosis. There is no significant neural foraminal narrowing at L5-S1. There is moderate right and severe left neural foraminal narrowing at L5-S1. The paraspinal tissues are within normal limits. Trace bilateral pleural effusions.     - Increased T2 signal in the spinal cord from the level of T10 to the conus medullaris primarily involving the gray matter tracts with focal T2 hyperintensity in the dorsal columns at T10, question HIV-related vacuolar myelopathy. -Multilevel degenerative changes, as above. Disc bulge and facet arthrosis at L5-S1 with resulting severe left and moderate right neural foraminal narrowing. ==================== ADDENDUM (12/11/2018 8:35 PM): On review, the following additional findings were noted: On further evaluation, the above-described findings in the spinal cord in conjunction with findings on concurrent MRI brain are concerning for infection, likely of fungal etiology in the setting of untreated HIV infection. HIV-related vacuolar myelopathy is not likely.    Echocardiogram W Colorflow Spectral Doppler    Result Date: 12/11/2018  ?? Technically difficult study due to chest wall/lung interference ?? Normal left ventricular systolic function, ejection fraction > 55% ?? Degenerative mitral valve disease ?? Normal right ventricular systolic function ?? No apparent valvular vegetations      Echocardiogram Follow Up/limited Echo    Result Date: 01/06/2019  Patient Info Name:     Don [name] Age:     55 years DOB:     1976/02/19 Gender:     Male MRN:     [id number] Accession #:     [id number] UN Ht:     165 cm Wt:     70 kg BSA:     1.81 m2 Technical Quality:     Poor Exam Date:     01/06/2019 9:18 AM Site Location:     [facility] Exam Location:     [facility] Admit Date:     12/10/2018 Exam Type:     ECHOCARDIOGRAM FOLLOW UP/LIMITED ECHO-AR Study Info Indications      - Assess for vegetations/endocarditis Limited 2D, color flow, Doppler and agitated saline transthoracic echocardiogram is performed. Staff Attending Physician:     Ebony Hail Referring Physician:     Willy Eddy  ; Fellow:     Janace Hoard MD Sonographer:     Susette Racer Ordering Physician:     Charise Killian A Account #:     [id number] Reason for Poor Study:     poor echocardiographic windows Summary   1. Limited study to assess valves.   2. Normal left ventricular systolic function, ejection fraction > 55%.   3. Normal right ventricular systolic function.   4. Tricuspid regurgitation - mild.   5. No detectable vegetations. Left Ventricle   The left ventricular systolic function is normal, LVEF is visually estimated at > 55%. Right Ventricle   Normal right ventricular systolic function. Aortic Valve   The aortic valve  is trileaflet with normal appearing leaflets with normal excursion.   There is no significant aortic regurgitation by color flow and continuous wave Doppler imaging. Pulmonic Valve   Pulmonic valve is suboptimally imaged. Mitral Valve   The mitral valve leaflets are mildly thickened with normal leaflet mobility.   There is trivial mitral valve regurgitation by color flow and continuous wave Doppler imaging. Tricuspid Valve   The tricuspid valve leaflets are normal, with normal leaflet mobility.   There is mild tricuspid regurgitation by color flow and continuous wave Doppler imaging. Other Findings   There are no detectable vegetations; clinical correlation and complementary diagnostic studies are required to confirm or exclude a diagnosis of endocarditis. Tricuspid Valve ---------------------------------------------------------------------- Name                                 Value        Normal ---------------------------------------------------------------------- TV Regurgitation Doppler ---------------------------------------------------------------------- TR Peak Velocity                     2 m/s               TR Peak Gradient                   20 mmHg Ventricles ---------------------------------------------------------------------- Name                                 Value        Normal ---------------------------------------------------------------------- LV Dimensions 2D/MM ---------------------------------------------------------------------- IVS Diastolic Thickness (2D)        0.9 cm       0.6-1.0 Report Signatures Finalized by Vevelyn Francois on 01/06/2019 02:48 PM Resident Janace Hoard on 01/06/2019 10:07 AM    Pvl Arterial Duplex Lower Extremity Bilateral    Result Date: 12/10/2018    Peripheral Vascular Lab     [address]   [place], [place] [address]  PVL ARTERIAL DUPLEX LOWER EXTREMITY BILATERAL Patient Demographics Pt. Name: Don [name] Location: PVL Inpatient Bedside MRN:      [id number]        Sex:      M DOB:      Aug 17, 1975       Age:      51 years  Study Information Authorizing         [id number] Ladell Pier Performed Time       12/10/2018 Provider Name       REARICK                                    11:38:47 AM Ordering Physician  Ladell Pier       Patient Location     [facility]                     [place] Accession Number    [id number] UN         Technologist         Eli Phillips Diagnosis:                                Assisting  Technologist Ordered Reason For Exam: vasculitis  Other Indication: Discoloration of bilateral feet, vasculitis. Other Factors: H/o HIV. Examination Protocol: Duplex scanning was selectively utilized to scan segments of one or both extremities. Images were recorded to document color and PW spectral Doppler analysis as well as B-mode characteristics. Physiologic Findings Right CFA Acceleration Time Left CFA Acceleration Time 89 msec                     122 msec  Pressures and PPG tracings were not obtained per Covid protocol.  Right Duplex Findings: A focal velocity elevation of 308 cm/s was obtained at proximal popliteal artery with a VR of 2.6. Findings are characteristic of a 50-74% stenosis. Heterogenous plaque was visualized at the distal SFA to proximal popliteal artery. No significant waveform deterioration was identified distal to the popliteal stenosis.  Summary of Findings  Right Inflow:   No evidence of hemodynamically significant inflow obstruction at rest. Outflow:  50-74% stenosis identified in the popliteal artery without waveform           deterioration. Runoff:   No evidence of hemodynamically significant runoff obstruction at rest. Left Inflow:   No evidence of hemodynamically significant inflow obstruction at           rest. Outflow:  No evidence of hemodynamically significant outflow obstruction at           rest. Runoff:   No evidence of hemodynamically significant runoff obstruction at           rest.  Final Interpretation  Right: Arterial obstruction involving the SFA and/or popliteal        artery.  Left: Normal examination. No evidence of arterial occlusive disease. Electronically signed by [id number] Jodell Cipro MD on 12/10/2018 at 3:46:34 PM.    Final     Pvl Arterial Duplex Upper Extremity Bilateral    Result Date: 12/10/2018    Peripheral Vascular Lab     [address]   [place], [place] [address]  PVL ARTERIAL DUPLEX UPPER EXTREMITY BILATERAL Patient Demographics Pt. Name: Don [name] Location: PVL Inpatient Bedside MRN:      [id number]        Sex:      M DOB:      [birth year]       Age:      21 years  Study Information Authorizing         [id number] Ladell Pier Performed Time       12/10/2018 Provider Name       REARICK                                    11:50:37 AM Ordering Physician  Ladell Pier       Patient Location     [facility]                     [place] Accession Number    [id number] UN         Technologist         Eli Phillips Diagnosis:                                Assisting  Technologist Ordered Reason For Exam: gangrene  Other Indication: Gangrene of left fingers. 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  Risk Factors: None.  Examination Protocol: Duplex is used to assess specific areas of concern per ordering physician. If no specific area is identified prior to exam, Doppler spectral waveforms from bilateral brachial, radial, and ulnar arteries. Additional Doppler waveforms may be obtained from the axillary and subclavian artery at the discretion of the technologist. Further duplex scanning may be performed at any level of the upper extremity at the discretion of the technologist/ordering physician. Blood pressures may be obtained from one or more levels of the arm bilaterally. Findings  Right  Duplex waveforms in the subclavian, axillary, brachial, radial and ulnar        artery appear multiphasic with sharp systolic upstrokes. Pressures and        PPG tracings were not obtained per Covid protocol. Left: Duplex waveforms in the subclavian, axillary, brachial, radial and ulnar       artery appear multiphasic with sharp systolic upstrokes. Pressures and PPG       tracings were not obtained per Covid protocol.  Summary of Findings Right: No evidence of hemodynamically significant arterial obstruction detected        proximal to the wrist in the upper extremity. Left: No evidence of hemodynamically significant arterial obstruction detected       proximal to the wrist in the upper extremity.  Final Interpretation  Right: No significant arterial obstruction detected proximal to the        wrist in the right upper extremity. Left: No significant arterial obstruction detected proximal to the       wrist in the left upper extremity.  Electronically signed by [id number] Jodell Cipro MD on 12/10/2018 at 3:45:44 PM.   --------------------------------------------------------------------------------   Final     Pvl Venous Duplex Lower Extremity Bilateral    Result Date: 01/22/2019   Peripheral Vascular Lab     [address]   [place], [place] [address]  PVL VENOUS DUPLEX LOWER EXTREMITY BILATERAL Patient Demographics Pt. Name: Don [name] Location: PVL Inpatient Lab MRN:      [id number]        Sex:      M DOB:      09-17-1975       Age:      61 years  Study Information Authorizing         [id number] Edmon Crape Performed Time       01/21/2019 3:04:46 Provider Name                                                  PM Ordering Physician  Edmon Crape        Patient Location     [facility] Accession Number    [id number] [facility]         Technologist         Carlton Adam  RVT Diagnosis:                                Assisting            Juanna Cao,                                           Technologist         Extern Ordered Reason For Exam: DVT Indication: weakness LE Risk Factors: Immobility and Surgery (lymph node surgery 12/31/2018). Other Factors: (HIV). Anticoagulation: (Lovenox). Protocol The major deep veins from the inguinal ligament to the ankle are assessed for bilaterally for compressibility and spectral Doppler flow characteristics. The assessed veins include bilateral common femoral vein, femoral vein in the thigh, popliteal vein, and intramuscular calf veins. The iliac vein is assessed indirectly using Doppler waveform analysis. The great saphenous vein is assessed for compressibility at the saphenofemoral junction, and the small saphenous vein assessed for compressibility behind the knee.  Right Duplex Findings All veins visualized appear fully compressible. Doppler flow signals demonstrate normal spontaneity, phasicity, and augmentation. Ultrasound characteristics of lymph nodes noted in the groin.  Left Duplex Findings All veins visualized appear fully compressible. Doppler flow signals demonstrate normal spontaneity, phasicity, and augmentation. Ultrasound characteristics of lymph nodes noted in the groin.  Right Technical Summary No evidence of deep venous obstruction in the lower extremity. No indirect evidence of obstruction proximal to the inguinal ligament. Ultrasound characteristics of lymph nodes noted in the groin.  Left Technical Summary No evidence of deep venous obstruction in the lower extremity. No indirect evidence of obstruction proximal to the inguinal ligament. Ultrasound characteristics of lymph nodes noted in the groin.  Final Interpretation Right There is no evidence of DVT in the lower extremity. There is no evidence of obstruction proximal to the inguinal ligament or in the common femoral vein. Lymph nodes noted in the groin. Left There is no evidence of DVT in the lower extremity. There is no evidence of obstruction proximal to the inguinal ligament or in the common femoral vein. Lymph nodes noted in the groin.  Electronically signed by [id number] Jodell Cipro MD on 01/22/2019 at 10:27:30 AM.   Final     Pvl Venous Duplex Lower Extremity Bilateral    Result Date: 12/10/2018   Peripheral Vascular Lab     [address]   [place], [place] [address]  PVL VENOUS DUPLEX LOWER EXTREMITY BILATERAL Patient Demographics Pt. Name: Don [name] Location: PVL Inpatient Bedside MRN:      [id number]        Sex:      M DOB:      1975/10/09       Age:      16 years  Study Information Authorizing         [id number] Ladell Pier Performed Time       12/10/2018 Provider Name       REARICK                                    11:27:17 AM Ordering Physician  Ladell Pier       Patient Location     [facility]  [place] Accession Number    [id number] UN         Technologist         Eli Phillips Diagnosis:                                Dispensing optician Ordered Reason For Exam: lower extremity swelling Indication: Swelling Risk Factors: Current Covid infection and immobility. Anticoagulation: (Heparin). Protocol The major deep veins from the inguinal ligament to the ankle are assessed for bilaterally for compressibility and spectral Doppler flow characteristics. The assessed veins include bilateral common femoral vein, femoral vein in the thigh, popliteal vein, and intramuscular calf veins. The iliac vein is assessed indirectly using Doppler waveform analysis. The great saphenous vein is assessed for compressibility at the saphenofemoral junction, and the small saphenous vein assessed for compressibility behind the knee. Limitations: Limited study per Covid protocol.  Right Duplex Findings All veins visualized appear fully compressible. Doppler flow signals demonstrate normal spontaneity, phasicity, and augmentation.  Left Duplex Findings All veins visualized appear fully compressible. Doppler flow signals demonstrate normal spontaneity, phasicity, and augmentation.  Right Technical Summary No evidence of deep venous obstruction in the lower extremity. No indirect evidence of obstruction proximal to the inguinal ligament.  Left Technical Summary No evidence of deep venous obstruction in the lower extremity. No indirect evidence of obstruction proximal to the inguinal ligament.  Final Interpretation Right There is no evidence of DVT in the lower extremity. There is no evidence of obstruction proximal to the inguinal ligament or in the common femoral vein. Left There is no evidence of DVT in the lower extremity. There is no evidence of obstruction proximal to the inguinal ligament or in the common femoral vein.  Electronically signed by [id number] Jodell Cipro MD on 12/10/2018 at 3:47:06 PM.   Final     Xr Abdomen 1 View    Result Date: 01/22/2019  EXAM: XR ABDOMEN 1 VIEW DATE: 01/22/2019 12:02 PM ACCESSION: [id number] UN DICTATED: 01/22/2019 12:51 PM [facility]PRETATION LOCATION: [facility] CLINICAL INDICATION: 43 years old Male with evaluate stool burden ; OTHER  COMPARISON: PET CT 12/25/2018 TECHNIQUE: Supine view of the abdomen. FINDINGS: Mild gaseous distention of the stomach. Multiple prominent, although nondilated loops of small bowel and colon throughout the abdomen. Nonobstructive bowel gas pattern. Moderate colonic stool burden centered about the cecum. No other soft tissue or osseous abnormality.     Nonobstructive bowel gas pattern. Moderate colonic stool burden centered about the cecum.    Ct Lumbar Spine Reformat W Contrast    Result Date: 12/10/2018  EXAM: Computed tomography, lumbar spine with contrast material. DATE: 12/10/2018 4:08 PM ACCESSION: [id number] UN DICTATED: 12/10/2018 4:16 PM INTERPRETATION LOCATION: [facility] CLINICAL INDICATION: 43 years old Male with HIV, low CD 4, LE weakness, urinary retention  COMPARISON: None TECHNIQUE: Axial CT images through the lumbar spine with contrast. Coronal and sagittal reformatted images, bone and soft tissue algorithm are provided. FINDINGS:  There is no fracture. The vertebrae are normally aligned. No paravertebral soft tissue abnormality.  No abnormal contrast enhancement.     No osseous or surrounding soft tissue abnormality of the lumbar spine.    Pet Ct Skull Base To Thigh  Result Date: 12/25/2018  EXAM: Positron emission tomography (PET) with concurrently acquired computed tomography (CT) for attenuation correction and anatomic localization: PET CT SKULL BASE TO THIGH DATE: 12/25/18 ACCESSION: [id number] UN DICTATED: 12/25/2018 5:04 PM INTERPRETATION LOCATION: [facility] CLINICAL INDICATION: 43 years old Male: evaluate for malignancy. ? Toxo vs lymphoma. Initial treatment strategy. RADIOPHARMACEUTICAL: F-18 Fluorodeoxyglucose (FDG), IV  TECHNIQUE: Following the administration of radiopharmaceutical, PET images were acquired using 3D-acquisition and reconstructed with attenuation-correction.  A single-breathhold CT scan was obtained at quiet end-expiration without oral or IV contrast for anatomic localization and attenuation-correction. Additional dedicated head and neck PET/CT images were acquired. The coregistered PET and CT images were evaluated in axial, coronal, and sagittal planes.  Scanner: Siemens Biograph mCT Injected activity: 8.5 mCi Uptake time: 60 minutes Site of injection: Port Serum glucose: 75 mg/dL COMPARISON: Chest CT 9/81/1914, MRI brain and spine 12/10/2018  FINDINGS: HEAD, NECK and SUPRACLAVICULAR REGIONS: FDG avid bilateral neck (levels 2-5) and supraclavicular region lymph nodes. No abnormal uptake in the brain, specifically in the region of the right corona radiata, although the lesion described on brain MRI may be too small for FDG PET due to size and physiologic surrounding activity. Left maxillary retention cyst. CHEST: Thyroid: Grossly unremarkable. Lungs and Pleura: No suspicious hypermetabolic nodules. Bilateral basilar atelectasis. No pleural effusion.] Mediastinum, Hila and Axillae: FDG avid lymph nodes in bilateral axillary and subpectoral regions. FDG avid right hilar lymph node. Mild uptake in distal esophagus (CT 128), likely of no significance. Recommend attention on future imaging. Cardiovascular: [No significant coronary calcifications. No pericardial effusion.Right PICC tip is in the proximal right atrium. ABDOMEN and PELVIS: Liver: No suspicious hypermetabolic lesions. Gallbladder: Distended. No cholelithiasis. Spleen: No suspicious hypermetabolic lesions or diffuse uptake. No splenomegaly. Pancreas: No suspicious hypermetabolic lesions. GI/Peritoneum/Mesentery: No suspicious hypermetabolic lesions or diffuse uptake. Adrenals: No suspicious hypermetabolic lesions. Kidneys: Grossly unremarkable. GU: Grossly unremarkable. Foley catheter in place. Adenopathy: Subcentimeter FDG avid left para-aortic (index node CT 188), left common iliac, bilateral pelvic sidewall and bilateral external iliac lymph nodes. Bilateral FDG avid inguinal lymph nodes, most likely reactive. Vasculature: [Grossly unremarkable] MUSCULOSKELETAL: Increased spinal cord uptake at about T11 and T12 level.     - Mildly FDG avid lymph nodes in the neck, supraclavicular regions, axillae, left para-aortic and bilateral iliac and pelvic sidewall distributions, as above. These findings are most compatible with low-grade lymphoma or systemic infection/inflammation. Tissue biopsy should be considered. Lymph nodes in left lower neck (CT 61) and right axilla (CT 84) appear to be more intense than the remaining lymph nodes. - Increased spinal cord uptake at about T11 and T12 level. This is felt to be physiologic due to movement of the lower extremities and of no significance despite being in about the same region as the area of focal enhancement on recent spine MRI. - No abnormal uptake in the brain, specifically in the region of the right corona radiata, although the lesion described on brain MRI may be too small for FDG PET due to size and physiologic surrounding activity.    ______________________________________________________________________  Discharge Instructions:               Resources and Referrals     [facility]  [address] [place], [place] [address]  Phone: [phone]    If you are uninsured, payment for services is based on a sliding-scale fee based on your income. Please bring:  1. Proof of income for every income-earning member in your household. Examples:  Paycheck stub   Tax form reporting your income   Bank statement if you receive direct deposit  2. Proof of residence. For example, a bill addressed to you sent by mail.  [id number] Food Assistance information  http://adkins.net/    When money is tight or when you are faced with a sudden illness or emergency, there may not be enough for food. Don???t go hungry! There are programs that may be able to help. We provided the resources below to find food assistance programs across the state, specific to your county or region. Information below on Food Pantries, Help Paying for Food, and Hot Meals. Call or follow the links to identify programs to assist your family.     Food Pantries  Looking for a food pantry?  Use the link below to find programs that may be able to help.  Food Pantries - provide non-perishable groceries MobileKicks.be    Help Paying for Food  Need help paying for food? Use the link below to find programs that may be able to help.  NightlifePreviews.ch    Food Stamps/SNAP/EBT - provide SNAP, food stamps, EBT, and other financial help to purchase food  NightlifePreviews.ch    WIC - provide nutrition education and food vouchers for pregnant women, new mothers, infants, and children younger than age five who cannot afford an adequate diet  ToePaint.co.nz    Hot Meals  Looking for a hot meal? Can't leave your home and need meals delivered? Use the links below to find programs that may be able to help.  Hot Meals - serve hot meals CallRank.dk  Home Delivered Meals - provide meals to people who are unable to leave their home SeekArcade.nl    Can't find what you need or need additional help?  Our [place] 2-1-1 call specialists are available 24 hours a day, 7 days a week by dialing 2-1-1 or [phone] from any phone. The call is free, confidential, and available in any language.                  Follow Up instructions and Outpatient Referrals     Infectious Disease            Appointments which have been scheduled for you    Feb 15, 2019 10:00 AM  (Arrive by 9:45 AM)  HOSPITAL FOLLOW UP with Hoyle Sauer, MD  [facility] INFECTIOUS DISEASES [address] [facility]) [address]  [address]  [place] [address]  [phone]           ______________________________________________________________________  Discharge Day Services:  BP 122/72  - Pulse 78  - Temp 36.8 ??C  - Resp 16  - Ht 165.1 cm (5' 5)  - Wt 70.4 kg (155 lb 1.6 oz)  - SpO2 99%  - BMI 25.81 kg/m??   Pt seen on the day of discharge and determined appropriate for discharge.    Condition at Discharge: good    Length of Discharge: I spent greater than 30 mins in the discharge of this patient.

## 2019-01-22 NOTE — Unmapped (Signed)
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   - SLP for cognitive and swallow function.  - Neuropsych for higher level cognitive evaluation and coping.  - RT for community re-integration, education, and leisure support services.  - P&O for assistive devices PRN.  - Pharmacy consult for patient and family education on medication management.   - Nutrition consult for diet information/teaching.   - To be discussed in weekly Interdisciplinary Team Conference.      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 Workup significant for MRI showing increased T2 signal in the spinal cord from the level of T10 to the conus medullaris primarily involving the gray matter tracts with focal T2 hyperintensity in the dorsal columns at T10.   ??? Pain Control  o Tylenol for mild, Tramadol for mod/severe  o Neuropathic pain: Gabapentin 100mg    ??? Neurogenic Bladder  o Start SCI TOV on admission, anticipate he will need I/O caths (q4H)  o Maintain cath volumes <500 cc   ??? Neurogenic Bowel: UMN Bowel  o Admission KUB with moderate stool burden: plan for bowel cleanout with Mag Citrate + SMOG Enema on admission   o 3:2:1 (Colace, Senna, Dulcolax suppository in evening + dig stim)   ??? Spasticity  o No complaints of spasms at this time.  ??? Equipment/Support  o Please apply PRAFO boots to bilateral feet while in bed to prevent ankle contractures  ??? DVT Prophylaxis  o DVT ppx with Lovenox x 8 weeks (11/27)   ??? Skin  o Patients with SCI are at higher risk for pressure injuries due to lack of sensation and inability to reposition self.  o Turn patient Q2H to prevent pressure injuries  o Maintain nursing skin integrity protocol with daily skin checks  ??? Autonomic Dysreflexia  o Pt not at risk for AD given level  ??? Prognostication/Counseling  o ASIA Exam - Pending  o Education regarding patient's diagnosis, anticipated recovery, reintegration to the community, and fertility to be completed    CNS Toxoplasmosis: Gradually worsening LE weakness/numbness??progressing to lower extremity paralysis 8/22,. MRI spine notable for multiple spinal cord lesions (increased T2??signal from T10??to conus medullaris). MRI brain with??single enhancing??brain lesion to the right??corona radiata. 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 LP 8/22 notable for elevated cells, lymphocytes, and protein, toxoplasmosis and EBV positive, negative cytology. Sulfadiazine, pyrimethamine, and leucovorin started 8/24 for toxoplasmosis. Repeat LP 8/25, again negative for malignant cells. CMV+ in the blood but not CSF. He received??ganciclovir 8/22-8/25.    - Continue Sulfadiazine,  pyrimethamine, and leucovorin for 6 weeks (8/24 - 10/5)  - After 6 weeks of therapy, will start pyrimethamine   - Should pyrimethamine be unavailable outpatient, he can be placed on Bactrim DS 2XD.    HIV/AIDS: Last CD4 = 231/14%.  Diagnosed in 2007 and was treatment naive on admission (had previously declined ART due to religious reasons). Started Tivacay/Descovy 8/26.??  - Plan for switch to [facility] on discharge.  -  Dr. Dolores Frame has initiated process to coordinate outpatient follow-up and drug assistance enrollment.  - Will need bactrim prophylaxis for PJP (hold for now)   - Continue nystatin swishes for oral thrush  ????????????????????  Dry gangrene L hand (improving) ??  Etiology remains unclear, likely multifactorial. Bilateral UE arterial PVLs without any HD-significant findings and blood cultures obtained in the ED were negative. Has had extensive work up including multispecialty consultations including rheumatology, vascular surgery, hematology. Likely early atherosclerotic disease and recommended baby aspirin and agree with starting a statin (see ASCVD risk, below).   - Outpatient follow-up in hand clinic   - bandage changes daily   ??  Tinea pedis and onychomycosis: Trichophyton confirmed on biopsy.   - Griseofulvin 375mg  BID x 6 months total (9/16 - )  - Continue econazole cream to bilateral feet BID  ??  ASCVD Risk: Pooled Cohort 10-yr risk is 3%, however, HIV is an independent risk factor for CVD which is not included in the risk score. Pt has atherosclerotic disease in his lower extremities and dyslipidemia.   - Atorvastatin 20mg  daily  - Aspirin 81mg  daily     COVID-19 infection (resolved)  - Asymptomatic. Completed 5 days remdesivir.??  - Date of positive SARS-CoV-2 PCR: 12/09/2018 at OSH.   - Patient was 21 days from positive PCR on 9/9 and precautions discontinued.  ??  ??  Insomnia  - Melatonin 3mg  qhs  - Trazodone 50mg  daily prn (Not home medication)      FEN/PPX:  - Diet: Regular   - Fluids: Encourage PO hydration.  - Electrolytes: Monitor and replete PRN.  - DVT ppx: High risk for VTE given limited mobility during hospitalization. Lovenox.  - GI ppx: Not indicated at this time.    Access: none     Code status: full, confirmed on admission     DISPO: Admitted to Rehab floor, patient will be discussed at next interdisciplinary team conference.    Estimated Length of Stay: 14-21 days    Anticipated Post-Rehab Destination / Needs: home    Medical Necessity: The patient requires acute inpatient rehabilitation to maximize functional independence and requires daily physician visits to manage DVT/PE prevention, abx management, wound care, preventative skin care, bowel and bladder management, nutrition and hydration.  This patient's rehabilitation goals and medical complexity could not adequately be managed in a less intensive setting.  Potential risks for clinical complications include DVT/PE, wound infection, SBO, UTI, pressure injury to skin.    Medical Prognosis: Good for continued progress and participation with therapy.    Medical Prognosis: Good for continued progress and participation with therapy.  ??  Anticipated Interdisciplinary Rehabilitation Interventions: Activity tolerance: Patient is expected to tolerate minimum of 3 hrs of therapy daily @ 5x/week. Physical therapy to work on mobility. Occupational therapy to work on self care. Recreational therapy for adjustment to new disability. Neuropsych. Weekly interdisciplinary team conference to assess progress and plan of care changes.  ??  Expected Functional Outcomes:  Expected level of improvement: Goals for inpatient rehabilitation are Mod I for mobility and ADLs.      SUBJECTIVE:     Reason for Admission: Comprehensive interdisciplinary inpatient rehabilitation program.    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 Workup notable for Toxoplasma and EBV in his CSF, as well as CMV+ low level in the peripheral blood but negative in CSF. He was most recently found to have significant diffuse lymphadenopathy on PET scan concerning for lymphoma and is now s/p left axillary excisional lymph node biopsy on 9/10, which was negative for malignancy and organisms. Don [name] has participated in acute inpatient physical and occupational therapies to improve functional mobility, activity tolerance, functional strength, balance, and endurance in order to facilitate safe performance of ADLs and daily routines. Don [name] has been referred to [facility] for continued acute medical management, provision of intensive inpatient therapies, and patient/family training to facilitate safe performance of ADLs and mobility prior to discharge home.     Today, the patient states he is doing well. Denies pain. States he has some feeling to his LEs, only when someone is pressnig really hard. He denies sensation with BMs or voiding. Denies fevers, chills, chest pain, SOB, cough. Reports he is eating well. No abdominal pain.       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 Pt began walking with a cane immediately prior to admission, but was ambulatory upon admission. Pt was independent with ADLs and IADLs, and drives. Pt wears glasses for driving at baseline. Endorses occasional falls at home when quickly running to the shower and falling into the tub.    Feeding - Needs Assistance: Requires additional structure  ??  Grooming - Needs Assistance: Requires additional structure  ??  Bathing - Needs Assistance: Mod assist  ??  Toileting - Needs Assistance: Max assist  ??  UB Dressing - Needs Assistance: Min assist;Physical assistance required  ??  LB Dressing - Needs Assistance: Max assist  ??  IADLs: NT  ??  ??  Mobility:   Bed Mobility: Supine to sit min assist, sit to supine mod assist.EOB ~20 minutes initially requiring min A for balance  ??  Transfers: Bed<>chair via transfer board with max assist.  ??  Gait: Unable  ??  Stairs: NA  ??  Wheelchair Mobility: NA  ??  ??  Cognition/Swallow/Speech: Alert, OX4, follow commands, reg diet  ??  DME Recommendations:  PT DME Recommendations: Defer to post acute  ??  ??  Willingness to Participate: Pt willing and able to participate in three hours of therapies daily          Medical / Surgical History: Reviewed  Past Medical History:   Diagnosis Date   ??? HIV (human immunodeficiency virus infection) (CMS-HCC)      Past Surgical History:   Procedure Laterality Date   ??? PR BX/REMV,LYMPH NODE,DEEP AXILL Left 12/31/2018    Procedure: BX/EXC LYMPH NODE; OPEN, DEEP AXILRY NODE;  Surgeon: [name], MD;  Location: MAIN OR [facility];  Service: Surgical Oncology     Social History: Reviewed  Social History     Tobacco Use   ??? Smoking status: Never Smoker   ??? Smokeless tobacco: Never Used  Substance Use Topics   ??? Alcohol use: Not Currently   ??? Drug use: Not Currently         Family History: Pertinent as stated and otherwise reviewed and non-contributory     Allergies: Reviewed    Medications at Discharge from Acute Hospital: Reviewed     Your Medication List      Notice    Cannot display discharge medications because the patient has not yet been admitted.       Review of Systems:    Full 10 systems reviewed and negative, other than as noted in the HPI.    OBJECTIVE:     Vitals:  Temp:  [36.6 ??C-36.8 ??C] 36.7 ??C  Heart Rate:  [78-102] 85  Resp:  [16-18] 18  BP: (122-132)/(72-79) 132/78  MAP (mmHg):  [90-99] 99  SpO2:  [99 %] 99 %    Intake/Output:  No intake/output data recorded.    Physical Exam:  General Appearance:Chronically ill appearing. In no acute distress.  HEENT: Head is atraumatic and normocephalic. Sclera anicteric without injection. Oropharyngeal membranes are moist with no erythema or exudate.  Neck: Supple.  Lungs: Normal work of breathing. Clear to auscultation in anterior fields. No wheezes or crackles.  Heart: Regular rate and rhythm. No murmurs, rubs, or gallops.  Abdomen: Soft, nontender, nondistended.  Extremities: No clubbing, cyanosis, or edema. + Dressing to L hand c/d/i   Neurological Examination:   Mental Status: Alert, conversant, able to follow conversation and interview. Spontaneous speech was fluent without word finding pauses, dysarthria, or paraphasic errors. Comprehension was intact. Memory for recent and remote events was intact.  Cranial Nerves: PERRL. Facial sensation intact bilaterally to light touch in all three divisions of CNV. Face symmetric at rest. Normal facial movement bilaterally, including forehead, eye closure and grimace/smile. Hearing intact to conversation. Shoulder shrug full strength bilaterally. Palate movement is symmetric. Tongue protrudes midline and tongue movements are normal.      Motor:   RUE:    5/5 elbow flexion (C5)   5/5 wrist extension (C6)   5/5 elbow extension (C7)   5/5 long finger flexors (C8)   5/5 abductor digiti minimi (T1)  LUE:   5/5 elbow flexion (C5)   5/5 wrist extension (C6)   5/5 elbow extension (C7)   5/5 long finger flexors (C8)   5/5 abductor digiti minimi (T1)  RLE:   0/5 hip flexion (L2)   0/5 knee extension (L3)   0/5 ankle dorsiflexion (L4)   0/5 extensor hallicus longus (L5)   0/5 plantar flexion (S1)  LLE:   0/5 hip flexion (L2)   0/5 knee extension (L3)   0/5 ankle dorsiflexion (L4)   0/5 extensor hallicus longus (L5)   0/5 plantar flexion (S1)     Sensory: BUE sensation intact to light touch. Decreased sensation to light touch to BLEs.     Tone: within normal limits, no spasticity noted    Psych: mood euthymic, affect appropriate, thought process logical       Labs and Diagnostic Studies: Reviewed  CBC:  Recent Labs   Lab Units 01/20/19  0503 01/17/19  0516   WBC 10*9/L 5.9 5.2   RBC 10*12/L 4.29* 4.39*   HEMOGLOBIN g/dL 16.1* 09.6*   HEMATOCRIT % 36.8* 37.8*   MCV fL 85.7 86.1   MCH pg 28.9 28.6   MCHC g/dL 04.5 40.9   RDW % 81.1* 17.3*   PLATELET COUNT (1) 10*9/L 318 321   MPV fL 7.2  7.9 CMP:  Recent Labs   Lab Units 01/20/19  0503 01/17/19  0516   SODIUM mmol/L 137 137   POTASSIUM mmol/L 4.3 4.3   CHLORIDE mmol/L 101 101   CO2 mmol/L 23.0 25.0   BUN mg/dL 12 12   CREATININE mg/dL 1.61 0.96   GLUCOSE mg/dL 98 045*   CALCIUM mg/dL 9.4 9.1   MAGNESIUM mg/dL 1.8 1.8         Radiology Results: Reviewed      Sueanne Margarita, D.O., PGY-2  [facility] Physical Medicine & Rehabilitation  Between 8am-5pm Monday-Friday, please page [phone]. After hours, please page PM&R 1st call.

## 2019-01-22 NOTE — Unmapped (Signed)
Patient is alert and oriented  x4, afebrile. Continue with po antibiotic therapy. He is incontinent of bowels, foley intact patent. Turned and reposition every two hours and as needed. No falls. D/C to [facility] as ordered. Call report to center.Will continue with POC  Problem: Adult Inpatient Plan of Care  Goal: Plan of Care Review  Outcome: Progressing     Problem: Adult Inpatient Plan of Care  Goal: Patient-Specific Goal (Individualization)  Outcome: Progressing     Problem: Adult Inpatient Plan of Care  Goal: Absence of Hospital-Acquired Illness or Injury  Outcome: Progressing     Problem: Adult Inpatient Plan of Care  Goal: Optimal Comfort and Wellbeing  Outcome: Progressing     Problem: Adult Inpatient Plan of Care  Goal: Readiness for Transition of Care  Outcome: Progressing     Problem: Infection  Goal: Infection Symptom Resolution  Outcome: Progressing     Problem: Adult Inpatient Plan of Care  Goal: Rounds/Family Conference  Outcome: Progressing     Problem: Self-Care Deficit  Goal: Improved Ability to Complete Activities of Daily Living  Outcome: Progressing     Problem: Skin Injury Risk Increased  Goal: Skin Health and Integrity  Outcome: Progressing     Problem: Wound  Goal: Optimal Wound Healing  Outcome: Progressing     Problem: Self-Care Deficit  Goal: Improved Ability to Complete Activities of Daily Living  Outcome: Progressing

## 2019-01-22 NOTE — Unmapped (Signed)
Problem: Adult Inpatient Plan of Care  Goal: Plan of Care Review  Outcome: Progressing  Goal: Patient-Specific Goal (Individualization)  Outcome: Progressing  Goal: Absence of Hospital-Acquired Illness or Injury  Outcome: Progressing  Goal: Optimal Comfort and Wellbeing  Outcome: Progressing  Goal: Readiness for Transition of Care  Outcome: Progressing  Goal: Rounds/Family Conference  Outcome: Progressing     Problem: Infection  Goal: Infection Symptom Resolution  Outcome: Progressing     Problem: Fall Injury Risk  Goal: Absence of Fall and Fall-Related Injury  Outcome: Progressing     Problem: Self-Care Deficit  Goal: Improved Ability to Complete Activities of Daily Living  Outcome: Progressing     Problem: Wound  Goal: Optimal Wound Healing  Outcome: Progressing     Problem: Skin Injury Risk Increased  Goal: Skin Health and Integrity  Outcome: Progressing

## 2019-01-22 NOTE — Unmapped (Signed)
Facility Information: [facility]  Facility Medicare provider number: [id number]      Physical Medicine and Rehabilitation  PMR Rehab Pre-Admit Screening [facility]  Date: 01/22/2019   Time: 11:08 AM     Patient Information    Patient Name:  Don [name] Medical Record Number: [id number]   Address:  [address], [place] [address] Sex: Male   Date of Birth: [birth year] Age: 43 y.o.   Room/Bed:  5221/5221-01    _____________________________________________________________________________    Attending Physician's Review and Admission Determination     Medical Necessity:  The patient requires acute inpatient rehabilitation to maximize functional independence and requires daily physician visits to manage DVT/PE prevention, abx management, wound care, preventative skin care, bowel and bladder management, nutrition and hydration.  This patient's rehabilitation goals and medical complexity could not adequately be managed in a less intensive setting.  Potential risks for clinical complications include DVT/PE, wound infection, SBO, UTI, pressure injury to skin.    Medical Prognosis: Good for continued progress and participation with therapy.    Anticipated Interdisciplinary Rehabilitation Interventions: Activity tolerance: Patient is expected to tolerate minimum of 3 hrs of therapy daily @ 5x/week. Physical therapy to work on mobility. Occupational therapy to work on self care. Recreational therapy for adjustment to new disability. Neuropsych. Weekly interdisciplinary team conference to assess progress and plan of care changes.    Expected Functional Outcomes:  Expected level of improvement: Goals for inpatient rehabilitation are Mod I for mobility and ADLs.    Caro Hight, MD 01/22/2019 11:14 AM                  _____________________________________________________________________________    Advanced Directives:                        Coverage Information  Authorization number:    Authorization Code:  [id number]  Activation Code Expiration: 03/22/2019 10:29 AM  Payor: Ezequiel Essex / Plan: BCBS BLUE VALUE / Product Type: *No Product type* /     Physician/Referral Information  Referring Facility: [facility]       Leane Platt, MD  Referring Case Manager: Desma Maxim           Prior Living Situation:  Living Environment: House    Lives With: Spouse;Daughter    Home Living: One level home;Stairs to enter with rails;Tub/shower unit;Standard height toilet               Prior Level of Function:  Prior Function Comments: Prior to admission, 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 Pt began walking with a cane immediately prior to admission, but was ambulatory upon admission. Pt was independent with ADLs and IADLs, and drives. Pt wears glasses for driving at baseline. Endorses occasional falls at home when quickly running to the shower and falling into the tub.                           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 Workup notable for Toxoplasma and EBV in his CSF, as well as CMV+ low level in the peripheral blood but negative in CSF. He was most recently found to have significant diffuse lymphadenopathy on PET scan concerning for lymphoma and is now s/p left axillary excisional lymph node biopsy on 9/10, which was negative for malignancy and organisms. Mr. Don [name] has participated in acute inpatient physical and occupational therapies to improve functional mobility, activity tolerance, functional strength, balance, and endurance in order to facilitate safe performance of ADLs and daily routines. Mr. Don [name] has been referred to [facility] for continued acute medical management, provision of intensive inpatient therapies, and patient/family training to facilitate safe performance of ADLs and mobility prior to discharge home.      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 Bilateral leg weakness 12/11/2018   ??? Urinary retention with bladder stretch injury 12/11/2018   ??? Thrush 12/10/2018   ??? Gangrene (CMS-HCC) 12/10/2018   ??? COVID-19 virus infection 12/10/2018   ??? History of dental problems 12/10/2018   ??? Lower extremity edema 12/10/2018   ??? HIV (human immunodeficiency virus infection) (CMS-HCC) 12/10/2018       Medical / Functional Conditions Requiring Inpatient Rehab: Patient requires monitoring of labwork, electrolytes, blood pressure and monitoring/management of medical diagnosis. Medical management/administration of antilipemics, anticoagulants, antiretrovirals, antibiotics and other prescribed/necessary medications.  Monitoring/management of pain, cardiopulmonary status, renal status, gastrointestinal status, neurological status, infection, bleeding, bowel and bladder, DVT, and skin breakdown.      Risk for Medical / Clinical Complication: Patient at increased risk for complications of cardiopulmonary insufficiency, renal insufficiency, hypertensive crisis, gastrointestinal insufficiency, neurological decline, seizures, fluid volume overload, aspiration, bleeding, infection, uncontrolled pain, bowel/bladder retention, falls, and skin breakdown.      Special Rehabilitation Needs  IV Lines / Tubes / Drains: Patient at increased risk for complications of cardiopulmonary insufficiency, renal insufficiency, hypertensive crisis, gastrointestinal insufficiency, neurological decline, seizures, fluid volume overload, aspiration, bleeding, infection, uncontrolled pain, bowel/bladder retention, falls, and skin breakdown.                   Cultural Requests During Hospitalization: Pt at increased risk of anxiety/depression related to recent medical status change       Nutrition Type: General            Precautions: Falls precautions       Required Braces or Orthoses: RLE PRAFO;LLE PRAFO    Lines/Drains/Airways:  Urethral Catheter Coude 16 Fr. (Active)   Site Assessment Clean;Intact 01/22/19 0500   Collection Container Standard drainage bag 01/22/19 0500   Securement Method Securing device (Describe) 01/22/19 0500   Urinary Catheter Necessity Yes 01/22/19 0500   Necessity reviewed with MDK 01/21/19 2000   Indications Urinary catheter placed for urologic procedure/surgery 01/22/19 0500   Output (mL) 800 mL 01/22/19 0500   Number of days: 8                   Past Medical History:  Past Medical History:   Diagnosis Date   ??? HIV (human immunodeficiency virus infection) (CMS-HCC)      Past Surgical History:  Past Surgical History:  Procedure Laterality Date   ??? PR BX/REMV,LYMPH NODE,DEEP AXILL Left 12/31/2018    Procedure: BX/EXC LYMPH NODE; OPEN, DEEP AXILRY NODE;  Surgeon: [name], MD;  Location: MAIN OR [facility];  Service: Surgical Oncology     Family History:  Family History   Problem Relation Age of Onset   ??? Diabetes Sister    ??? Kidney disease Sister    ??? Alcohol abuse Brother    ??? Mental illness Other      Social History:  Social History     Socioeconomic History   ??? Marital status: Single     Spouse name: None   ??? Number of children: None   ??? Years of education: None   ??? Highest education level: None   Occupational History   ??? None   Social Needs   ??? Financial resource strain: None   ??? Food insecurity     Worry: None     Inability: None   ??? Transportation needs     Medical: None     Non-medical: None   Tobacco Use   ??? Smoking status: Never Smoker   ??? Smokeless tobacco: Never Used   Substance and Sexual Activity   ??? Alcohol use: Not Currently   ??? Drug use: Not Currently   ??? Sexual activity: None   Lifestyle   ??? Physical activity     Days per week: None     Minutes per session: None   ??? Stress: None   Relationships   ??? Social Wellsite geologist on phone: None     Gets together: None     Attends religious service: None     Active member of club or organization: None     Attends meetings of clubs or organizations: None     Relationship status: None   Other Topics Concern   ??? None   Social History Narrative   ??? None     Current Facility-Administered Medications Ordered in Epic   Medication Dose Route Frequency Provider Last Rate Last Dose   ??? acetaminophen (TYLENOL) tablet 650 mg  650 mg Oral Q4H PRN Larina Earthly, MD   650 mg at 01/22/19 1610   ??? alteplase (ACTIVASE) injection small catheter clearance 2 mg  2 mg Intravenous Once PRN Larina Earthly, MD       ??? alteplase (ACTIVASE) injection small catheter clearance 2 mg  2 mg Intravenous Once PRN Sharen Counter, MD       ??? aluminum-magnesium hydroxide-simethicone (MAALOX MAX) 80-80-8 mg/mL oral suspension  30 mL Oral Q4H PRN Larina Earthly, MD       ??? aspirin chewable tablet 81 mg  81 mg Oral Daily Sharen Counter, MD   81 mg at 01/22/19 0939   ??? atorvastatin (LIPITOR) tablet 20 mg  20 mg Oral Daily Alison Murray Hoppens, MD   20 mg at 01/22/19 9604   ??? bisacodyL (DULCOLAX) suppository 10 mg  10 mg Rectal Daily PRN Larina Earthly, MD   10 mg at 12/13/18 1408   ??? calcium carbonate (TUMS) chewable tablet 400 mg elem calcium  400 mg elem calcium Oral Daily PRN Larina Earthly, MD       ??? dextrose 50 % in water (D50W) 50 % solution 12.5 g  12.5 g Intravenous Q10 Min PRN Larina Earthly, MD   12.5 g at 12/12/18 1725   ??? dolutegravir (TIVICAY) TABLET Tab 50 mg  50 mg  Oral daily Larina Earthly, MD   50 mg at 01/22/19 0941   ??? econazole nitrate (SPECTAZOLE) 1 % cream   Topical BID Larina Earthly, MD       ??? emtricitabine-tenofovir alafen (Descovy) 200-25 mg tablet 1 tablet  1 tablet Oral Daily Larina Earthly, MD   1 tablet at 01/22/19 0941   ??? enoxaparin (LOVENOX) syringe 40 mg  40 mg Subcutaneous Q24H [facility] Edmon Crape, MD   40 mg at 01/22/19 0941   ??? gabapentin (NEURONTIN) capsule 100 mg  100 mg Oral TID Larina Earthly, MD   100 mg at 01/22/19 0981   ??? glucagon injection 1 mg  1 mg Subcutaneous Once PRN Larina Earthly, MD       ??? griseofulvin (GRIS-PEG) tablet 375 mg  375 mg Oral BID Sharen Counter, MD   375 mg at 01/22/19 0942   ??? guaiFENesin (ROBITUSSIN) oral syrup  200 mg Oral Q4H PRN Larina Earthly, MD       ??? leucovorin (WELLCOVORIN) tablet 10 mg  10 mg Oral Daily Sharen Counter, MD   10 mg at 01/22/19 0941   ??? LORazepam (ATIVAN) injection 1 mg  1 mg Intravenous Once PRN Larina Earthly, MD       ??? magnesium oxide (MAG-OX) tablet 400 mg  400 mg Oral Daily Napoleon Form, MD   400 mg at 01/22/19 0939   ??? melatonin tablet 3 mg  3 mg Oral QPM Napoleon Form, MD   3 mg at 01/21/19 2134   ??? naloxone (NARCAN) injection 0.1 mg  0.1 mg Intravenous Q5 Min PRN Larina Earthly, MD       ??? nystatin (MYCOSTATIN) oral suspension  500,000 Units Oral 4x Daily Larina Earthly, MD   500,000 Units at 01/22/19 0511   ??? ondansetron (ZOFRAN-ODT) disintegrating tablet 8 mg  8 mg Oral Q8H PRN Larina Earthly, MD   8 mg at 01/20/19 0855    Or   ??? ondansetron [facility]) injection 4 mg  4 mg Intravenous Q8H PRN Larina Earthly, MD   4 mg at 01/02/19 0546   ??? polyethylene glycol (MIRALAX) packet 17 g  17 g Oral BID Larina Earthly, MD   17 g at 01/21/19 2146   ??? pyrimethamine (DARAPRIM) oral suspension  75 mg Oral Daily Larina Earthly, MD   75 mg at 01/22/19 0857   ??? senna (SENOKOT) tablet 2 tablet  2 tablet Oral Nightly Larina Earthly, MD   2 tablet at 01/21/19 2146   ??? sulfaDIAZINE tablet 1,500 mg  1,500 mg Oral Q6H Larina Earthly, MD   1,500 mg at 01/22/19 0510   ??? traZODone (DESYREL) tablet 50 mg  50 mg Oral Nightly PRN Napoleon Form, MD         Current Outpatient Medications Ordered in Epic   Medication Sig Dispense Refill   ??? bictegrav-emtricit-tenofov ala (BIKTARVY) 50-200-25 mg tablet Take 1 tablet by mouth daily. 30 tablet 6   ??? pyrimethamine (DARAPRIM) 25 mg tablet Take 3 tablets (75 mg total) by mouth daily. 90 tablet 0     No medications prior to admission.          Vitals:    01/21/19 1300 01/21/19 2200 01/22/19 0500 01/22/19 0800   BP: 132/78 131/79 122/72    Pulse: 103 102 78    Resp: 18 17 16     Temp: 36.8 ??C (98.2 ??F) 36.6 ??C (97.9 ??  F) 36.8 ??C (98.2 ??F) 36.8 ??C (98.2 ??F)   TempSrc: Oral Oral Oral    SpO2: 99% 99% 99%    Weight:       Height:           Vitals    Height:  165.1 cm (5' 5), Weight: 70.4 kg (155 lb 1.6 oz)    Labs:      CBC -   Lab Results   Component Value Date    WBC 5.9 01/20/2019    RBC 4.29 (L) 01/20/2019    HGB 12.4 (L) 01/20/2019    HCT 36.8 (L) 01/20/2019    MCV 85.7 01/20/2019    MCH 28.9 01/20/2019    MCHC 33.7 01/20/2019    MPV 7.2 01/20/2019    PLT 318 01/20/2019     BMP -   Lab Results   Component Value Date    NA 137 01/20/2019    K 4.3 01/20/2019    CL 101 01/20/2019    CO2 23.0 01/20/2019    BUN 12 01/20/2019    CREATININE 0.74 01/20/2019    GFR >= 60 11/05/2010    GFRAAM >90 01/20/2019    GFRNAAM >90 01/20/2019    GLU 98 01/20/2019     CARD -   Lab Results   Component Value Date    TROPONINI <0.034 12/23/2018     Coagulation -   Lab Results   Component Value Date    PT 11.8 12/26/2018    INR 1.03 12/26/2018    APTT 31.4 12/26/2018     ABGs- No results found for: PHART, PO2ART, PCO2ART, BEART, HCO3ART, O2SATART  LFT's -   Lab Results   Component Value Date    ALBUMIN 3.8 01/08/2019    ALT 40 01/08/2019    AST 43 01/08/2019    ALKPHOS 108 01/08/2019    BILITOT 0.5 01/08/2019    BILIDIR 0.10 12/12/2018    PROT 7.7 01/08/2019       Current Functional Status:        Feeding - Needs Assistance: Requires additional structure    Grooming - Needs Assistance: Requires additional structure    Bathing - Needs Assistance: Mod assist    Toileting - Needs Assistance: Max assist    UB Dressing - Needs Assistance: Min assist;Physical assistance required    LB Dressing - Needs Assistance: Max assist    IADLs: NT      Mobility:   Bed Mobility: Supine to sit min assist, sit to supine mod assist.EOB ~20 minutes initially requiring min A for balance    Transfers: Bed<>chair via transfer board with max assist.    Gait: Unable    Stairs: NA    Wheelchair Mobility: NA      Cognition/Swallow/Speech: Alert, OX4, follow commands, reg diet                   DME Recommendations:  PT DME Recommendations: Defer to post acute         Willingness to Participate: Pt willing and able to participate in three hours of therapies daily    Rehab Goals and Plan    Expected level of improvement for safe discharge: Patient will discharge home as independent as possible with ADLs and functional mobility with least restrictive assistive device for household distances.     Patient / Family Goals: Patient will safely return home with family, modified independence in ADLs and assist as needed with ambulation for household distances.  Required treatments and services: Rehab nursing, Case management, Dietician/nutrition     Anticipated Interventions:    Physical Therapy: 60-120 min/day 5-7 days/wk  Occupational Therapy: 60-120 min/day 5-7 days/wk  Recreational therapy: 30 min/day 3 days/wk  Prosthetics and Orthotics: As Needed      Anticipated services upon discharge:     Outpatient therapy: PT, OT    Expected discharge destination: Home with spouse and other family assistance    Discharge support: Patient has a caregiver available    Patient/family/caregiver orientation: Patient and family agreeable to inpatient rehab plan    Estimated Length of Stay: 14-21 days     Projected Admission Date: Friday January 22 2019    Reviewer's Signature, Date and Time: Myriam Forehand [facility]  [facility]  Inpatient Coordinator  [phone]    11:08 AM 01/22/2019

## 2019-01-23 LAB — COMPREHENSIVE METABOLIC PANEL
ALBUMIN: 3.8 g/dL (ref 3.5–5.0)
ALKALINE PHOSPHATASE: 84 U/L (ref 38–126)
ALT (SGPT): 21 U/L (ref <50)
ANION GAP: 8 mmol/L (ref 7–15)
AST (SGOT): 22 U/L (ref 19–55)
BILIRUBIN TOTAL: 0.4 mg/dL (ref 0.0–1.2)
BLOOD UREA NITROGEN: 10 mg/dL (ref 7–21)
BUN / CREAT RATIO: 14
CALCIUM: 9.3 mg/dL (ref 8.5–10.2)
CHLORIDE: 100 mmol/L (ref 98–107)
CO2: 27 mmol/L (ref 22.0–30.0)
CREATININE: 0.69 mg/dL — ABNORMAL LOW (ref 0.70–1.30)
EGFR CKD-EPI AA MALE: >90 mL/min/{1.73_m2} (ref >=60)
EGFR CKD-EPI NON-AA MALE: >90 mL/min/{1.73_m2} (ref >=60)
GLUCOSE RANDOM: 122 mg/dL — ABNORMAL HIGH (ref 70–99)
SODIUM: 135 mmol/L (ref 135–145)

## 2019-01-23 LAB — CBC
HEMATOCRIT: 36.1 % — ABNORMAL LOW (ref 41.0–53.0)
HEMOGLOBIN: 11.9 g/dL — ABNORMAL LOW (ref 13.5–17.5)
MEAN CORPUSCULAR HEMOGLOBIN: 28.6 pg (ref 26.0–34.0)
MEAN CORPUSCULAR VOLUME: 86.5 fL (ref 80.0–100.0)
MEAN PLATELET VOLUME: 7.2 fL (ref 7.0–10.0)
PLATELET COUNT: 288 10*9/L (ref 150–440)
RED BLOOD CELL COUNT: 4.17 10*12/L — ABNORMAL LOW (ref 4.50–5.90)
RED CELL DISTRIBUTION WIDTH: 17.6 % — ABNORMAL HIGH (ref 12.0–15.0)
WBC ADJUSTED: 6.5 10*9/L (ref 4.5–11.0)

## 2019-01-23 LAB — ARBOVIRUS ABS, SERUM: Lab: 0

## 2019-01-23 LAB — PLATELET COUNT: Platelets:NCnc:Pt:Bld:Qn:Automated count: 288

## 2019-01-23 LAB — ALKALINE PHOSPHATASE: Alkaline phosphatase:CCnc:Pt:Ser/Plas:Qn:: 84

## 2019-01-23 MED ORDER — LEUCOVORIN CALCIUM 10 MG TABLET
Freq: Every day | ORAL | 0 refills | 0.00000 days | Status: SS
Start: 2019-01-23 — End: 2019-07-25

## 2019-01-23 NOTE — Unmapped (Signed)
Renaldo Fiddler received evaluations by therapists and staff on first day since admission to rehab.    Azlan requires an indwelling urinary catheter managed by staff; bowel program - teaching provided with assistance of interpreter; incontinence x1 during shift; no complaints of pain, wound dressings clean and intact; no caregiver present.      Problem: Rehabilitation (IRF) Plan of Care  Goal: Plan of Care Review  Outcome: Ongoing - Unchanged  Goal: Patient-Specific Goal (Individualization)  Outcome: Ongoing - Unchanged  Goal: Absence of Hospital-Acquired Illness or Injury  Outcome: Ongoing - Unchanged  Goal: Home Safety Plan Established  Outcome: Ongoing - Unchanged  Goal: Demonstration of Effective Coping Strategies  Outcome: Ongoing - Unchanged  Goal: Community Reintegration Plan Established  Outcome: Ongoing - Unchanged     Problem: Fall Injury Risk  Goal: Absence of Fall and Fall-Related Injury  Outcome: Ongoing - Unchanged     Problem: Wound  Goal: Optimal Wound Healing  Outcome: Ongoing - Unchanged     Problem: Skin Injury Risk Increased  Goal: Skin Health and Integrity  Outcome: Ongoing - Unchanged     Problem: Self-Care Deficit  Goal: Improved Ability to Complete Activities of Daily Living  Outcome: Ongoing - Unchanged

## 2019-01-23 NOTE — Unmapped (Signed)
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   ??  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 Urine sent for culture, plan to wait on sensitivities to treat.     REHAB:   - PT and OT to maximize functional status with mobility and ADLs as well as prevention of joint contracture.   - SLP for cognitive and swallow function.  - Neuropsych for higher level cognitive evaluation and coping.  - RT for community re-integration, education, and leisure support services.  - P&O for assistive devices PRN.  - Pharmacy consult for patient and family education on medication management.   - Nutrition consult for diet information/teaching.   - To be discussed in weekly Interdisciplinary Team Conference.  ??  ??  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 Workup significant for MRI showing increased T2 signal in the spinal cord from the level of T10 to the conus medullaris primarily involving the gray matter tracts with focal T2 hyperintensity in the dorsal columns at T10.   ?? Pain Control  ? Tylenol for mild, Tramadol for mod/severe  ? Neuropathic pain: Gabapentin 100mg    ?? Neurogenic Bladder  ? Start SCI TOV on admission, anticipate he will need I/O caths (q4H)  ? Maintain cath volumes <500 cc   ?? Neurogenic Bowel: UMN Bowel  ? Admission KUB with moderate stool burden: plan for bowel cleanout with Mag Citrate + SMOG Enema on admission   ? 3:2:1 (Colace, Senna, Dulcolax suppository in evening + dig stim)   ?? Spasticity  ? No complaints of spasms at this time.  ?? Equipment/Support  ? Please apply PRAFO boots to bilateral feet while in bed to prevent ankle contractures  ?? DVT Prophylaxis  ? DVT ppx with Lovenox x 8 weeks (11/27)   ?? Skin  ? Patients with SCI are at higher risk for pressure injuries due to lack of sensation and inability to reposition self.  ? Turn patient Q2H to prevent pressure injuries  ? Maintain nursing skin integrity protocol with daily skin checks  ?? Autonomic Dysreflexia  ? Pt not at risk for AD given level  ?? Prognostication/Counseling  ? ASIA Exam - Pending  ? Education regarding patient's diagnosis, anticipated recovery, reintegration to the community, and fertility to be completed  ??  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 MRI spine notable for multiple spinal cord lesions (increased T2??signal from T10??to conus medullaris). MRI brain with??single enhancing??brain lesion to the right??corona radiata. Per radiology,??spinal cord findings most consistent with toxoplasmosis, brain lesion less consistent (not rim enhancing). LP 8/22 notable for elevated cells, lymphocytes, and protein, toxoplasmosis and EBV positive, negative cytology. Sulfadiazine, pyrimethamine, and leucovorin started 8/24 for toxoplasmosis.??Repeat LP 8/25,??again??negative for malignant cells.??CMV+ in the blood but not CSF.??He received??ganciclovir 8/22-8/25.????  - Continue Sulfadiazine,  pyrimethamine, and leucovorin for 6 weeks (8/24 - 10/5)  - After 6 weeks of therapy,  will start pyrimethamine   - Should pyrimethamine be unavailable outpatient, he can be placed on Bactrim DS 2XD.  ??  HIV/AIDS: Last CD4 = 231/14%. Diagnosed in 2007 and was treatment naive on admission (had previously declined ART due to religious reasons). Started Tivacay/Descovy 8/26.??  - Plan for switch to [facility] on discharge.  -  Dr. Dolores Frame has initiated process to coordinate outpatient follow-up and drug assistance enrollment.  - Will need bactrim prophylaxis for PJP (hold for now)   - Continue nystatin swishes for oral thrush  ????????????????????  Dry gangrene L hand (improving) ??  Etiology remains unclear, likely multifactorial. Bilateral UE arterial PVLs without any HD-significant findings and blood cultures obtained in the ED were negative. Has had extensive work up including multispecialty consultations including rheumatology, vascular surgery, hematology. Likely early atherosclerotic disease and recommended baby aspirin and agree with starting a statin (see ASCVD risk, below).   - Outpatient follow-up in hand clinic   - bandage changes daily   ??  Tinea pedis??and onychomycosis: Trichophyton confirmed on biopsy.   - Griseofulvin 375mg  BID x 6 months total (9/16 - )  - Continue econazole cream to bilateral feet BID  ??  ASCVD Risk: Pooled Cohort 10-yr risk is 3%, however, HIV is an independent risk factor for CVD which is not included in the risk score. Pt has atherosclerotic disease in his lower extremities and dyslipidemia.   - Atorvastatin 20mg  daily  - Aspirin 81mg  daily   ??  COVID-19 infection (resolved)  - Asymptomatic. Completed 5 days remdesivir.??  - Date of positive SARS-CoV-2 PCR: 12/09/2018 at OSH.   - Patient was 21 days from positive PCR on 9/9 and precautions discontinued. ????  ??  Insomnia  - Melatonin 3mg  qhs  - Trazodone 50mg  daily prn (Not home medication)  ??  ??  FEN/PPX:  - Diet: Regular   - Fluids: Encourage PO hydration.  - Electrolytes: Monitor and replete PRN.  - DVT ppx: High risk for VTE given limited mobility during hospitalization. Lovenox.  - GI ppx: Not indicated at this time.    SUBJECTIVE:     Interval History: NAEO. Doing well this AM, no concerns, eager to start rehab.       OBJECTIVE:     Vital signs (last 24 hours):   Temp:  [36.6 ??C-36.7 ??C] 36.7 ??C  Heart Rate:  [85-88] 85  Resp:  [14-18] 14  BP: (127-140)/(78-88) 127/88  MAP (mmHg):  [99] 99  SpO2:  [99 %-100 %] 99 %  BMI (Calculated):  [23.37] 23.37    Intake/Output:  I/O last 3 completed shifts:  In: -   Out: 950 [Urine:950]    Physical Exam:    GEN: NAD, awake, alert  HEENT: Normcephalic, sclera anicteric   Cardio: No cyanosis or clubbing  Resp: Normal work of breathing, no retractions  Abd: non distended  Neuro: EOMI      Medications:    Scheduled   ??? aspirin chewable tablet 81 mg Daily   ??? atorvastatin (LIPITOR) tablet 20 mg Daily   ??? bisacodyL (DULCOLAX) suppository 10 mg QPM   ??? docusate sodium (COLACE) capsule 100 mg TID   ??? dolutegravir (TIVICAY) TABLET Tab 50 mg daily   ??? econazole nitrate (SPECTAZOLE) 1 % cream BID   ??? emtricitabine-tenofovir alafen (Descovy) 200-25 mg tablet 1 tablet Daily   ??? enoxaparin (LOVENOX) syringe 40 mg Q24H SCH   ??? gabapentin (NEURONTIN) capsule 100 mg TID   ??? griseofulvin (GRIS-PEG) tablet 375 mg  Daily   ??? leucovorin (WELLCOVORIN) tablet 10 mg Daily   ??? magnesium oxide (MAG-OX) tablet 400 mg Daily   ??? melatonin tablet 3 mg QPM   ??? nystatin (MYCOSTATIN) oral suspension 4x Daily   ??? SMOG ENEMA Once   ??? sulfaDIAZINE tablet 1,500 mg Q6H     PRN acetaminophen, 650 mg, Q4H PRN  calcium carbonate, 200 mg elem calcium, TID PRN  guaiFENesin, 100 mg, Q4H PRN  lidocaine 2% gel, 20 mL, Daily PRN  lidocaine 2% gel, 3 mL, Q4H PRN  ondansetron, 4 mg, Q6H PRN  simethicone, 80 mg, TID PRN  traMADoL, 25 mg, Q6H PRN  traMADoL, 50 mg, Q6H PRN      Continuous Infusions      Labs: I have reviewed the labs and studies from the last 24hrs.    CBC -   Recent Labs   Lab Units 01/23/19  0712 01/20/19  0503 01/17/19  0516   WBC 10*9/L 6.5 5.9 5.2   RBC 10*12/L 4.17* 4.29* 4.39*   HEMOGLOBIN g/dL 08.6* 57.8* 46.9*   HEMATOCRIT % 36.1* 36.8* 37.8*   MCV fL 86.5 85.7 86.1   MCH pg 28.6 28.9 28.6   MCHC g/dL 62.9 52.8 41.3   RDW % 17.6* 17.2* 17.3*   PLATELET COUNT (1) 10*9/L 288 318 321   MPV fL 7.2 7.2 7.9     BMP -   Recent Labs   Lab Units 01/23/19  0712 01/20/19  0503 01/17/19  0516   SODIUM mmol/L 135 137 137   POTASSIUM mmol/L 4.1 4.3 4.3   CHLORIDE mmol/L 100 101 101   CO2 mmol/L 27.0 23.0 25.0   BUN mg/dL 10 12 12    CREATININE mg/dL 2.44* 0.10 2.72   GLUCOSE mg/dL 536* 98 644*           This patient is admitted to the Physical Medicine and Rehabilitation - Inpatient - A service, please page [phone] service, please contact the author of this note 8am-5pm on weekdays for questions regarding this patient. After hours and on weekends please contact the 1st call reisdent pager

## 2019-01-23 NOTE — Unmapped (Signed)
This note serves to complete order for cognitive evaluation, completed today at 12:11 but inadvertently order not attached to note.

## 2019-01-23 NOTE — Unmapped (Signed)
OCCUPATIONAL THERAPY  Evaluation (01/23/19 1101)    Patient Name:  Don [name]       Medical Record Number: [id number]   Date of Birth: 1976/02/22  Sex: Male          OT Treatment Diagnosis:  ADL deficits s/p SCI 2/2 CNS toxoplasmosis      ASSESSMENT:  Problem List: Decreased strength, Decreased range of motion, Decreased endurance, Impaired balance, Decreased mobility, Decreased coordination, Impaired sensation, Orthopedic restrictions, Fall Risk, Impaired ADLs, Decreased knowledge of self-care  Clinical Decision Making: Moderate    Assessment: Don [name] is a 43 y.o. 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 Pt presents to AIR OT with paraparesis, BLE numbness, neurogenic bladder and bowel, and decreased functional mobility, all resulting in ADL deficits well below his baseline level of independence. Pt will benefit from OT in AIR setting to address deficits, provide patient/caregiver education, and to promote safe return to daily routines.    Today's Interventions: ADL retraining, Bed mobility, Education - Patient, Transfer training, Safety education, Positioning, AIR OT evaluation, education re: OT role & POC in AIR, educ re: visual skin checks, pressure relief, positioning to reduce risk of pressure injury, BSC transfer training    Activity Tolerance During Today's Session  Patient tolerated treatment well    Plan  Planned Frequency of Treatment:  1-2 hours per day for: 5-7 days per week  Planned Treatment Duration: 21 days    Planned Interventions:  Adaptive equipment, ADL retraining, Balance activities, Bed mobility, Conservation, Education - Patient, Education - Family / caregiver, Endurance activities, Functional mobility, Home exercise program, Neuromuscular re-education, Postular / Proximal stability, Positioning, Range of motion, Safety education, Therapeutic exercise, Transfer training, UE Strength / coordination exercise, Compensatory tech. training, Wheelchair training    Post-Discharge Occupational Therapy Recommendations:  OT Post Acute Discharge Recommendations: (anticipate HH OT)   OT DME Recommendations: (TBD)    GOALS:   Patient and Family Goals: Get stronger, be able to do activities, try to walk again    Long Term Goal #1: Pt will complete BADLs with setup to mod I by d/c.       Short Term:  Pt will complete DABSC transfer with SBA + LRAD.   Time Frame : 1 week  Pt will complete upper body dressing at EOB with SBA and no LOB.   Time Frame : 1 week  Pt will complete lower body dressing with min A at bed level.   Time Frame : 1 week  Pt will complete bathing assessment.   Time Frame : 1 week  Pt will be independent with pressure relief schedule during OOB ADL engagement.   Time Frame : 1 week    Prognosis:  Good  Positive Indicators:  PLOF, motivation  Barriers to Discharge: Functional strength deficits, Impaired Balance, Inability to safely perform ADLS, Inaccessible home environment    Subjective  Current Status Received seated in wheelchair, left supine in semi fowler position with all immediate needs within reach  Prior Functional Status Pt was independent with ADLs/IADLs prior to admission. 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 Pt reports he loves his job. Pt enjoys spending time with his daughers, going to parks, eating, and going swimming.  Services patient receives: OT, PT    Patient / Caregiver reports: I think I can do it without the slideboard (re: BSC transfer)    Past Medical History:   Diagnosis Date   ??? HIV (human immunodeficiency virus infection) (CMS-HCC)     Social History     Tobacco Use   ??? Smoking status: Never Smoker   ??? Smokeless tobacco: Never Used   Substance Use Topics   ??? Alcohol use: Not Currently      Past Surgical History:   Procedure Laterality Date   ??? PR BX/REMV,LYMPH NODE,DEEP AXILL Left 12/31/2018    Procedure: BX/EXC LYMPH NODE; OPEN, DEEP AXILRY NODE;  Surgeon: [name], MD;  [facility]: [facility];  Service: Surgical Oncology    Family History   Problem Relation Age of Onset   ??? Diabetes Sister    ??? Kidney disease Sister    ??? Alcohol abuse Brother    ??? Mental illness Other         Patient has no known allergies.     Objective Findings  Precautions / Restrictions  Falls precautions    Weight Bearing  Able to Maintain(NWB in ischemic digits)    Required Braces or Orthoses  RLE PRAFO, LLE PRAFO    Communication Preference  Verbal(Spanish interpreter Davonna Belling present throughout)    Pain  denied pain    Equipment / Environment  Vascular access (PIV, TLC, Port-a-cath, PICC), Patient not wearing mask for full session, Foley, Caregiver wearing mask for full session, Wheelchair, Bedside Commode(OT & OTs wearing eye shields)    Living Situation  Living Environment: Mobile home  Lives With: Spouse(3 daughters, ages 70, 53, and 4)  Home Living: One level home, Stairs to enter with rails, Tub/shower unit, Standard height toilet  Rail placement (outside): Bilateral rails  Number of Stairs: 5     Cognition   Orientation Level:  Oriented x 4   Arousal/Alertness:  Appropriate responses to stimuli   Attention Span:  Appears intact   Memory:  Appears intact   Following Commands:  Follows all commands and directions without difficulty   Safety Judgment:  Good awareness of safety precautions   Awareness of Errors:  Good awareness of safety precautions   Problem Solving:  Able to problem solve independently   Comments: Pleasant, highly motivated    Vision / Perception    Hearing: WFL   Vision: Wears glasses for reading only  Perception: appears intact       Hand Function  Hand Dominance: Right handed  R hand WFL, L hand with finger/thumb opposition intact, NWB digits 2 & 3    Skin Inspection  visible skin intact    ROM / Strength/Coordination  UE ROM/ Strength/ Coordination: BUE AROM WFL, 5/5 strength shoulder flexion, elbow flexion/extension  LE ROM/ Strength/ Coordination: no BLE AROM    Sensation:  Numbness distal to hips and around buttocks    Balance:  SBA static sitting EOB with 1 UE support on bed rail, min A dynamic sitting    Functional Mobility  Transfer Assistance Needed: Yes  Transfers - Needs Assistance: Min assist(min A slideboard transfers wheelchair<>bed and DABSC<>bed with B knee block, min A scoot pivot EOB<>DABSC with min A to manage LLE, LOB requiring min A to correct  Bed Mobility Assistance Needed: Yes  Bed Mobility - Needs Assistance: Min assist(SBA sit<>supine first attempt, min A to bring LLE onto bed on second attempt, SBA sup<>sit x2 with use of bed rails)  ADLs  ADLs: Needs assistance with ADLs  ADLs - Needs Assistance: Grooming, Bathing, Toileting, UB dressing, LB dressing  Grooming - Needs Assistance: Requires additional structure  Bathing - Needs Assistance: Delayed  Toileting - Needs Assistance: Max assist  UB Dressing - Needs Assistance: Min assist  LB Dressing - Needs Assistance: Max assist      Vitals / Orthostatics  At Rest: NAD  With Activity: NAD  Orthostatics: NAD             Occupational Therapy Session Duration  OT Individual - Duration: 60         I attest that I have reviewed the above information.  Signed: Raelene Bott, OT  Filed 01/23/2019    The care for this patient was completed by Raelene Bott, OT:  A student was present and participated in the care. Licensed therapist was physically present and immediately available to direct and supervise tasks that were related to patient management. The direction and supervision was continuous throughout the time these tasks were performed.    Raelene Bott, OT

## 2019-01-23 NOTE — Unmapped (Addendum)
Adult Nutrition Assessment Note    Visit Type: MD Consult  Reason for Visit: Assessment (Nutrition)    ASSESSMENT:   HPI & PMH: Per MD note, patient is a 43 y.o.??male??with??previously untreated HIV/AIDS??(CD4 142 on admission) who initially presented with dry gangrene to the 2nd and 3rd digits of L hand and pain/weakness/discoloration over his lower extremities, subsequently found to be COVID??+ without respiratory difficulties (now off precautions). 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   Nutrition Hx: RD attempted to speak with patient today in person.  RD was able to get minimal information from patient due to language barrier.  Patient indicates that he does eat well at home, 3 meals per day (Cooked by his wife), and that he is hungry all the time.  RD observed patient with breakfast tray (2 bowls of cereal), which patient was consuming without issue and states that it is very good.  Patient unable to express UBW likely due to language barrier.  Attempted to all patient about 15 minutes later with an interpreter Byrd Hesselbach [phone]).  Patient answered the phone, but stated he was in therapy.  RD will attempt to follow-up with patient at a later date.   Nutritionally Pertinent Meds: aspirin, Colace, Mag-Ox, melatonin, Zofran  Labs: Hgb: 11.9(L), Hct: 36.1(L)   Abd/GI: Denies N/V.  Last BM: 10/03 - per RN shift assessment  Skin: wound report reviewed  Patient Lines/Drains/Airways Status    Active Wounds     Name:   Placement date:   Placement time:   Site:   Days:    Wound 12/11/18 Incision Finger (Comment which one) Mid;Left   12/11/18    1900    Finger (Comment which one)   42    Wound 01/14/19 Skin Tear Foot Left   01/14/19    1128    Foot   8               Current nutrition therapy order:   Nutrition Orders          Nutrition Therapy General (Regular) starting at 10/02 1649           Anthropometric Data:  -- Height: 165.1 cm (5' 5)   -- Last recorded weight: 63.7 kg (140 lb 6.9 oz)  -- Admission weight: 63.7 kg (140 lb 6.9 oz)  -- IBW: 61.77 kg  -- Percent IBW: 103.12 %  -- BMI: Body mass index is 23.37 kg/m??.   -- Weight changes this admission:   Last 5 Recorded Weights    01/22/19 1641 01/22/19 1715   Weight: 63.7 kg (140 lb 6.9 oz) 63.7 kg (140 lb 6.9 oz)      -- Weight history PTA: -6.7kg (9.5%) in less than 2 months  Wt Readings from Last 10 Encounters:   01/22/19 63.7 kg (140 lb 6.9 oz)   12/10/18 70.4 kg (155 lb 1.6 oz)   06/24/11 90.2 kg (198 lb 13.7 oz)   11/05/10 (!) 189 kg (416 lb 10.7 oz)   07/28/06 76.2 kg (167 lb 15.9 oz)   04/28/06 77.9 kg (171 lb 11.8 oz)   01/20/06 75.3 kg (166 lb 0.1 oz)   09/23/05 82.5 kg (181 lb 14.1 oz)   08/05/05 83.3 kg (183 lb 10.3 oz)        Daily Estimated Nutrient Needs:   Energy: 1593-1911 kcals [25-30 kcal/kg using admission body weight, 63.7 kg (01/23/19 1003)]  Protein: 64-76 gm [1.0-1.2 gm/kg using admission body weight, 63.7 kg (01/23/19  1003)]  Carbohydrate:   [no restriction]  Fluid: 1593-1911 [1 mL/kcal (maintenance)]     Nutrition Focused Physical Exam:                         DIAGNOSIS:  Malnutrition Assessment using AND/ASPEN Clinical Characteristics:    Patient does not meet AND/ASPEN criteria for malnutrition at this time (01/23/19 1122)                       Overall nutrition impression: Patient will likely to be able to meet estimated needs with current po intake.  Weight documentation shows a significant 6.7kg (9.5%) weight loss in less than 2 months despite patient account that he eats well.  Nutrition supplements would be appropriate for patient to promote weight maintenance/weight gain.      GOALS:  Oral Intake:       - Patient to consume 75% of 3 meals per day.  - Patient to consume 100% of 2 oral supplements per day.  Anthropometric:       - Prevent further weight loss     RECOMMENDATIONS AND INTERVENTIONS:  1.  Recommend to continue current diet as ordered: Regular  2.  Recommend Ensure Plus BID for weight gain  3.  Monitor po intake and record % meals in Epic  4.  Weekly weights    RD Follow Up Parameters:  1-2 times per week (and more frequent as indicated)     I appreciate the opportunity to participate in the care of this patient.  Please contact me with any questions.    Terrence Dupont, MS, RD, LDN  Pager: [phone]

## 2019-01-23 NOTE — Unmapped (Signed)
PHYSICAL THERAPY  Evaluation (01/23/19 0845)     Patient Name:  Don [name]       Medical Record Number: [id number]   Date of Birth: Apr 28, 1975  Sex: Male            Treatment Diagnosis: Decreased mobility    ASSESSMENT  Problem List: Decreased strength, Impaired ADLs, Poor sensory processing, Decreased range of motion, Impaired sensation, Impaired balance, Fall Risk, Decreased mobility, Postural Weakness     Assessment : Don [name] is a 43 year old male admitted to [facility] on 12/10/2018 with previously untreated HIV/AIDS (CD4 142 on admission) who initially presented with 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 Workup notable for Toxoplasma and EBV in his CSF, as well as CMV+ low level in the peripheral blood but negative in CSF. He was most recently found to have significant diffuse lymphadenopathy on PET scan concerning for lymphoma and is now s/p left axillary excisional lymph node biopsy on 9/10, which was negative for malignancy and organisms. Pt would benefit with PT to maximize functional mobility     Today's Interventions: PT eval., w/c parts management, transfer training, pressure relief, preparation for d/c home (English ramp packet provided. Pt. reports relative who can build ramp should be able to read)    Personal Factors/Comorbidities Present: 3       Examination of Body System: 3-5 elements       Clinical Decision Making: High     PLAN  Planned Frequency of Treatment:  1-2 hours per day, for: 5-7 days per week Planned Treatment Duration: 2 1/2 weeks    Planned Interventions: Balance activities, Education - Patient, Education - Family / caregiver, Neuromuscular re-education, Home exercise program, Functional mobility, Postural re-education, Self-care / Home training, Therapeutic exercise, Therapeutic activity, Wheelchair evaluation / modification, Wheelchair training, Endurance activities, Actor Physical Therapy Recommendations:  (outpt PT - prelim)    PT DME Recommendations: Wheelchair(slide bd -prelim)           Goals:   Patient and Family Goals: Work hard and be able to walk again    Long Term Goal #1: in 2 weeks, pt. will perform all transfers indep. with or without slide bd       SHORT GOAL #1: Pt. will perform all bed mobility including achieving long sit and circle sit positions with Modified indep.              Time Frame : 1 week  SHORT GOAL #2: Pt. will perform all transfers including car with supervision              Time Frame : 1 week  SHORT GOAL #3: pt. will propel w/c 400 feet with modified indep. maintaining left UE precautions              Time Frame : 1 week  SHORT GOAL #4: Pt. will perform pressure relief independently according to protocol              Time Frame : 1 week                      Prognosis:  Good  Positive  Indicators: Motivated  Barriers to Discharge: Functional strength deficits, Severity of deficits    SUBJECTIVE  Patient reports: I wish i wasn't going to the bathroom so much.  Don Hansen, RN  report mag citrate had been  given. Dr. Delene [name] report pt. to start bowel program this PM.  Current Functional Status: max A slide bd transfer on acute care     Prior Functional Status: Per report,  pt was an independent community ambulator who works at a [employer] in [place]. Pt began walking with a cane immediately prior to admission.  Equipment available at home: Gilmer Mor     Past Medical History:   Diagnosis Date   ??? HIV (human immunodeficiency virus infection) (CMS-HCC)     Social History     Tobacco Use   ??? Smoking status: Never Smoker   ??? Smokeless tobacco: Never Used   Substance Use Topics   ??? Alcohol use: Not Currently      Past Surgical History:   Procedure Laterality Date   ??? PR BX/REMV,LYMPH NODE,DEEP AXILL Left 12/31/2018    Procedure: BX/EXC LYMPH NODE; OPEN, DEEP AXILRY NODE;  Surgeon: [name], MD;  Location: MAIN OR [facility];  Service: Surgical Oncology    Family History   Problem Relation Age of Onset   ??? Diabetes Sister    ??? Kidney disease Sister    ??? Alcohol abuse Brother    ??? Mental illness Other         Allergies: Patient has no known allergies.                Objective Findings  Precautions / Restrictions  Precautions: Falls precautions  Weight Bearing Status: Able to Maintain(Left UE WBAT except through ischemic digits. Pt. may Weight bear through Left Hypothenar eminence and wrist)  Required Braces or Orthoses: RLE PRAFO, LLE PRAFO    Communication Preference: Verbal(via spanish interpreter Don [name])   Pain Comments: none stated     Equipment / Environment: Patient wearing mask for full session, Caregiver wearing mask for full session, Foley(PT wearing eye shield.  Dr. Delene [name] report foley likely to be d/c'd later in day)       With Activity: 148/86, HR 95, O2 sats 100% in w/c. Don Hansen, RN informed          Living Situation  Living Environment: Mobile home  Lives With: Spouse, Daughter  Home Living: One level home, Stairs to enter with rails, Tub/shower unit, Standard height toilet  Rail placement (outside): Bilateral rails  Number of Stairs: 5(pt. reports he may be able to borrow portable ramp from pastor.  If not, a relative who is a carperter can build. Ramp info provided)     Cognition: Follows all commands via interpreter.  Very pleasant and cooperative  Visual / Perception Status: wears glasses for driving per report  Skin Inspection: Left 3rd digit bandaged    UE ROM: WFL except left 3rd digit  UE Strength: grossly 5/5 except left grip strength not tested due to precautions  LE ROM: WFL with bilateral Ankles to neutral  LE Strength: No purposeful movements noted in bilateral LEs                       Sensation: Decreased to light touch bilateral LEs.  Balance: Able to sit edge of mat without UEs for support  but loses balance posteriorly with larger perturabtions      Other: Pt. able to perform pressure relief in w/c while maintaining  Left UE precautions.  Written handout in spanish provided.  Bed Mobility: Roll left and right with modified indep, supine to sit with CGA, sit to supine with SBA, supine to long sit with SBA. Pt. able to maintain left UE weight bearing precautions throughout  Transfers: slide board transfer to right with MinA blocking Knees and after board positioned   Gait  Gait: N/A  Stairs: N/A.  Ramp packet provided.   Wheelchair Mobility: propel w/c 150 feet with MinA on turns and initially with using proper technique in light of precautions.  Endurance: Tolerated full eval well.    Physical Therapy Session Duration  PT Individual - Duration: 75    Medical Staff Made Aware: Don Hansen, RN provided Zofran prior to PT.    I attest that I have reviewed the above information.  SignedClearance Coots, PT  Filed 01/23/2019

## 2019-01-23 NOTE — Unmapped (Signed)
A & O x 4 ,incontinent bowel ,Foley's cath in situ ,patient had 3 bowel movements ,not in any pain ,dressing changed ,call bell & phone within reach ,will continue to monitor.  Problem: Rehabilitation (IRF) Plan of Care  Goal: Plan of Care Review  Outcome: Progressing  Goal: Patient-Specific Goal (Individualization)  Outcome: Progressing  Goal: Absence of Hospital-Acquired Illness or Injury  Outcome: Progressing  Goal: Home Safety Plan Established  Outcome: Progressing  Goal: Demonstration of Effective Coping Strategies  Outcome: Progressing  Goal: Community Reintegration Plan Established  Outcome: Progressing     Problem: Fall Injury Risk  Goal: Absence of Fall and Fall-Related Injury  Outcome: Progressing     Problem: Wound  Goal: Optimal Wound Healing  Outcome: Progressing     Problem: Skin Injury Risk Increased  Goal: Skin Health and Integrity  Outcome: Progressing     Problem: Self-Care Deficit  Goal: Improved Ability to Complete Activities of Daily Living  Outcome: Progressing

## 2019-01-23 NOTE — Unmapped (Addendum)
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 You have made excellent progress during your rehab stay.     MEDICATIONS:  Please take all your medications as prescribed above and note any changes.  Below is a list of your medications and what you take them for:   - HIV: Biktarvy  - Infection (Toxoplasmosis): Bactrim   - Toenail fungus: Griseofulvin   - Heart health: Aspirin, Atorvastatin     DIET:  Please try to consume a heart healthy diet and limit your intake of fast food, sweets, and sodas.     ACTIVITY:   Please continue to perform daily tasks and mobility as instructed by your doctors and therapists.    BOWEL PROGRAM:  - Docusate three times daily, Senna at noon, and Bisacodyl suppository at [facility].  - Perform digital stimulation with administration of evening suppository. Performed 5-10 minutes after inserting the suppository:   1) With the patient lying on their left side, gently insert a lubricated finger into the rectum.   2) Gently move the finger in a counterclockwise circular motion keeping the finger in contact with the rectal wall.   3) Continue for 20-60 seconds and no longer than 1 minute.   4) Repeat every 5-10 minutes until a bowel movement is produced.   4) Completion of the bowel program is noted by no further stool after 2 digital stimulations or when mucus is excreted without stool.    BLADDER PROGRAM:  - Continue performing In and Out catheterization every 6 hours.   - If you largely increase your amount of fluid intake you may have to increase the frequency you perform catheterization.  - If you begin to leak inbetween catheterizations, please contact Dr. Alfredia Ferguson.     BLOOD CLOT PREVENTION: Patients with spinal cord injuries are at increased risk of developing blood clots the first 8 weeks after injury.  - Continue your Lovenox shots until 03/19/2019     WHEN TO CALL YOUR PHYSICIAN:  Following discharge from the hospital, please call 911 immediately and go to the nearest Emergency Department if you notice:  - Temperature greater than 101.70F or chills  - Pain not controlled with prescribed medications  - Uncontrolled nausea or vomiting  - Worsening or persistent abdominal pain  - Inability to pass stool for 3 days or more despite performing bowel program  - Inability to empty bladder after several catheterization attempts  - Worsening or uncontrolled spasms  - Severe or worsening headache with no known cause  - Increased weakness or sensory changes   - Chest pain or shortness of breath      If you develop these symptoms or if you have trouble obtaining any of your medications, you may also call the [facility] at [phone]  as needed.    You may go to the [facility] at [address] in [place] or call the [facility] Link at [phone] for further assistance.      Follow-up:    You will have follow-up visits with the following clinics, please call the provided numbers if you do not have a visit on your discharge paperwork or hear from these clinics within a week from discharge.     [facility] Physical Medicine & Rehabilitation Clinic [facility], [address] [place], [place] [address], Phone: [phone], Fax: [phone]  [facility]  Orthopaedics Clinic - [facility], 2nd Floor, [address] [place], [place] [address] - Phone: [phone]  [facility] Infectious Disease Clinic - [facility], 1st Floor, [address] [place], [place] [address] - Phone: [phone] - Fax: [phone]    - A referral to with [facility] (Dr. Raoul Pitch)  has been made. If you are not contacted within 1-2 weeks after discharge, you will need contact the office to schedule an appointment date.       Dr. Raoul Pitch:    [facility]   [address]   [facility] Office Building [facility] Third Floor   [place], [place] [address]   Phone [phone]    A referral to [facility] has been made so you can get established with a PCP. If you are not contacted within 1 week after discharge to schedule an appointment, please call the number below.     [facility] - [address], [place], [place] [address] - Phone: [phone]

## 2019-01-24 LAB — COMPREHENSIVE METABOLIC PANEL
ALBUMIN: 4 g/dL (ref 3.5–5.0)
ALKALINE PHOSPHATASE: 101 U/L (ref 38–126)
ALT (SGPT): 21 U/L (ref <50)
ANION GAP: 15 mmol/L (ref 7–15)
AST (SGOT): 23 U/L (ref 19–55)
BILIRUBIN TOTAL: 0.5 mg/dL (ref 0.0–1.2)
BLOOD UREA NITROGEN: 11 mg/dL (ref 7–21)
CALCIUM: 9.3 mg/dL (ref 8.5–10.2)
CHLORIDE: 96 mmol/L — ABNORMAL LOW (ref 98–107)
CO2: 25 mmol/L (ref 22.0–30.0)
CREATININE: 0.67 mg/dL — ABNORMAL LOW (ref 0.70–1.30)
EGFR CKD-EPI AA MALE: >90 mL/min/{1.73_m2} (ref >=60)
EGFR CKD-EPI NON-AA MALE: >90 mL/min/{1.73_m2} (ref >=60)
GLUCOSE RANDOM: 184 mg/dL — ABNORMAL HIGH (ref 70–179)
POTASSIUM: 4 mmol/L (ref 3.5–5.0)
PROTEIN TOTAL: 7.4 g/dL (ref 6.5–8.3)
SODIUM: 136 mmol/L (ref 135–145)

## 2019-01-24 LAB — RED CELL DISTRIBUTION WIDTH: Lab: 17.2 — ABNORMAL HIGH

## 2019-01-24 LAB — CBC
HEMATOCRIT: 37.6 % — ABNORMAL LOW (ref 41.0–53.0)
HEMOGLOBIN: 12.1 g/dL — ABNORMAL LOW (ref 13.5–17.5)
MEAN CORPUSCULAR HEMOGLOBIN CONC: 32.2 g/dL (ref 31.0–37.0)
MEAN CORPUSCULAR HEMOGLOBIN: 28 pg (ref 26.0–34.0)
MEAN PLATELET VOLUME: 7.2 fL (ref 7.0–10.0)
RED BLOOD CELL COUNT: 4.31 10*12/L — ABNORMAL LOW (ref 4.50–5.90)
RED CELL DISTRIBUTION WIDTH: 17.2 % — ABNORMAL HIGH (ref 12.0–15.0)
WBC ADJUSTED: 10.1 10*9/L (ref 4.5–11.0)

## 2019-01-24 LAB — LIPASE: Triacylglycerol lipase:CCnc:Pt:Ser/Plas:Qn:: 62

## 2019-01-24 LAB — SODIUM: Sodium:SCnc:Pt:Ser/Plas:Qn:: 136

## 2019-01-24 NOTE — Unmapped (Signed)
PT reported that nausea had subsided and has rested well with eyes closed.  Iv intact to right forearm.  Iv ABT completed as ordered.  No further nausea or vomiting. Foley intact and draining dark yellow urine.  Pt encourage to drinks fluids. Vitals signs assessed and temp decreased.  Will cont to monitor.  Problem: Rehabilitation (IRF) Plan of Care  Goal: Plan of Care Review  Outcome: Progressing  Goal: Patient-Specific Goal (Individualization)  Outcome: Progressing  Goal: Absence of Hospital-Acquired Illness or Injury  Outcome: Progressing  Goal: Home Safety Plan Established  Outcome: Progressing  Goal: Demonstration of Effective Coping Strategies  Outcome: Progressing  Goal: Community Reintegration Plan Established  Outcome: Progressing     Problem: Fall Injury Risk  Goal: Absence of Fall and Fall-Related Injury  Outcome: Progressing     Problem: Wound  Goal: Optimal Wound Healing  Outcome: Progressing     Problem: Skin Injury Risk Increased  Goal: Skin Health and Integrity  Outcome: Progressing     Problem: Self-Care Deficit  Goal: Improved Ability to Complete Activities of Daily Living  Outcome: Progressing

## 2019-01-24 NOTE — Unmapped (Signed)
Physical Medicine and Rehabilitation  Individualized Overall Plan of Care  01/24/2019 9:35 AM          Patient Name: Don [name]   Medical Record Number: [id number]   Date of Birth: August 06, 1975   Sex: Male   Room/Bed: 7325/7325-02     Primary Impairment Group:       Admit Date/Time: 01/22/2019  4:38 PM     Physician Summary     Georgiana Shore will undergo inpatient rehabilitation to manage complex medical and rehabilitation needs related to SCI.         The patient will receive interdisciplinary care, including daily physician management for the following:   Pain, Neurogenic Bowel, Neurogenic Bladder, Wound care, Impaired sleep/wake cycles, Monitoring for medication side effects, Nutrition and Infection treatment     The patient will benefit from continued services by:   Physical Therapy, Occupational Therapy, Speech Therapy, Recreational Therapy and Neuropsychology     Rehab nursing is required to help manage the patient???s complex nursing needs related to their documented medical conditions.     The patient's medical prognosis is Fair to achieve the stated goals below and to be able to be discharged home with family assistance or supervision within the estimated length of stay of  .     Patient and Family Goals     ADL: Get stronger, be able to do activities, try to walk again  Mobility: Work hard and be able to walk again  [employer] Reintegration:       Quality Indicators - Goals       Eating Discharge Goal  Discharge Goal: Independent     Oral Hygiene Discharge Goal  Discharge Goal: Independent    Toileting Hygiene Discharge Goal  Discharge Goal: Independent    Toilet Transfer Discharge Goal  Discharge Goal: Independent    Shower/Bathe Self Discharge Goal  Discharge Goal: Set-up/clean-up    Upper Body Dressing Discharge Goal  Discharge Goal: Independent    Lower Body Dressing Discharge Goal  Discharge Goal: Set-up/clean-up    Putting On/Taking Off Footwear Discharge Goal  Discharge Goal: Set-up/clean-up Roll Left and Right Discharge Goal  Discharge Goal: Independent    Sit to Lying Discharge Goal  Discharge Goal: Independent    Lying to Sitting on Side of Bed Discharge Goal  Discharge Goal: Independent    Sit to Stand Discharge Goal  Discharge Goal: Not applicable    Chair/Bed-to-Chair Transfer Discharge Goal  Discharge Goal: Independent    Car Transfer Discharge Goal  Discharge Goal: Independent    Walk 10 Feet Discharge Goal  Discharge Goal: Not applicable    Walk 50 Feet with Two Turns Discharge Goal  Discharge Goal: Not applicable    Walk 150 Feet Discharge Goal  Discharge Goal: Not applicable    Walking 10 Feet on Uneven Surfaces Discharge Goal  Discharge Goal: Not applicable    1 Step (Curb) Discharge Goal  Discharge Goal: Not applicable    4 Steps Discharge Goal  Discharge Goal: Not applicable    12 Steps Discharge Goal  Discharge Goal: Not applicable    Picking Up Object Discharge Goal  Discharge Goal: Not applicable    Wheel 50 Feet with Two Turns Discharge Goal  Discharge Goal: Independent    Wheel 150 Feet Discharge Goal  Discharge Goal: Independent

## 2019-01-24 NOTE — Unmapped (Signed)
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   ??  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 Urine sent for culture, plan to wait on sensitivities to treat.   10/4 - Febrile overnight, started ceftriaxone for his UTI. BP not suggestive of AD. Nausea and vomiting, CBC/CMP/Lipase sent. Small loose BM with bowel program, holding on TOV until 24 hour with antibiotics.     REHAB:   - PT and OT to maximize functional status with mobility and ADLs as well as prevention of joint contracture.   - SLP for cognitive and swallow function.  - Neuropsych for higher level cognitive evaluation and coping.  - RT for community re-integration, education, and leisure support services.  - P&O for assistive devices PRN.  - Pharmacy consult for patient and family education on medication management.   - Nutrition consult for diet information/teaching.   - To be discussed in weekly Interdisciplinary Team Conference.  ??  ??  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 Workup significant for MRI showing increased T2 signal in the spinal cord from the level of T10 to the conus medullaris primarily involving the gray matter tracts with focal T2 hyperintensity in the dorsal columns at T10.   ?? Pain Control  ? Tylenol for mild, Tramadol for mod/severe  ? Neuropathic pain: Gabapentin 100mg    ?? Neurogenic Bladder  ? Start SCI TOV on admission, anticipate he will need I/O caths (q4H)  ? Maintain cath volumes <500 cc   ?? Neurogenic Bowel: UMN Bowel  ? Admission KUB with moderate stool burden: plan for bowel cleanout with Mag Citrate + SMOG Enema on admission   ? 3:2:1 (Colace, Senna, Dulcolax suppository in evening + dig stim)   ?? Spasticity  ? No complaints of spasms at this time.  ?? Equipment/Support  ? Please apply PRAFO boots to bilateral feet while in bed to prevent ankle contractures  ?? DVT Prophylaxis  ? DVT ppx with Lovenox x 8 weeks (11/27)   ?? Skin  ? Patients with SCI are at higher risk for pressure injuries due to lack of sensation and inability to reposition self.  ? Turn patient Q2H to prevent pressure injuries  ? Maintain nursing skin integrity protocol with daily skin checks  ?? Autonomic Dysreflexia  ? Pt not at risk for AD given level  ?? Prognostication/Counseling  ? ASIA Exam - Pending  ? Education regarding patient's diagnosis, anticipated recovery, reintegration to the community, and fertility to be completed  ??  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 MRI spine notable for multiple spinal cord lesions (increased T2??signal from T10??to conus medullaris). MRI brain with??single enhancing??brain lesion to the right??corona radiata. Per radiology,??spinal cord findings most consistent with toxoplasmosis, brain lesion less consistent (not rim enhancing). LP 8/22 notable for elevated cells, lymphocytes, and protein, toxoplasmosis and EBV positive, negative cytology. Sulfadiazine, pyrimethamine, and leucovorin started 8/24 for  toxoplasmosis.??Repeat LP 8/25,??again??negative for malignant cells.??CMV+ in the blood but not CSF.??He received??ganciclovir 8/22-8/25.????  - Continue Sulfadiazine,  pyrimethamine, and leucovorin for 6 weeks (8/24 - 10/5)  - After 6 weeks of therapy, will start pyrimethamine   - Should pyrimethamine be unavailable outpatient, he can be placed on Bactrim DS 2XD.  ??  HIV/AIDS: Last CD4 = 231/14%. Diagnosed in 2007 and was treatment naive on admission (had previously declined ART due to religious reasons). Started Tivacay/Descovy 8/26.??  - Plan for switch to [facility] on discharge.  -  Dr. Dolores Frame has initiated process to coordinate outpatient follow-up and drug assistance enrollment.  - Will need bactrim prophylaxis for PJP (hold for now)   - Continue nystatin swishes for oral thrush  ????????????????????  Dry gangrene L hand (improving) ??  Etiology remains unclear, likely multifactorial. Bilateral UE arterial PVLs without any HD-significant findings and blood cultures obtained in the ED were negative. Has had extensive work up including multispecialty consultations including rheumatology, vascular surgery, hematology. Likely early atherosclerotic disease and recommended baby aspirin and agree with starting a statin (see ASCVD risk, below).   - Outpatient follow-up in hand clinic   - bandage changes daily   ??  Tinea pedis??and onychomycosis: Trichophyton confirmed on biopsy.   - Griseofulvin 375mg  BID x 6 months total (9/16 - )  - Continue econazole cream to bilateral feet BID  ??  ASCVD Risk: Pooled Cohort 10-yr risk is 3%, however, HIV is an independent risk factor for CVD which is not included in the risk score. Pt has atherosclerotic disease in his lower extremities and dyslipidemia.   - Atorvastatin 20mg  daily  - Aspirin 81mg  daily   ??  COVID-19 infection (resolved)  - Asymptomatic. Completed 5 days remdesivir.??  - Date of positive SARS-CoV-2 PCR: 12/09/2018 at OSH.   - Patient was 21 days from positive PCR on 9/9 and precautions discontinued. ????  ??  Insomnia  - Melatonin 3mg  qhs  - Trazodone 50mg  daily prn (Not home medication)  ??  ??  FEN/PPX:  - Diet: Regular   - Fluids: Encourage PO hydration.  - Electrolytes: Monitor and replete PRN.  - DVT ppx: High risk for VTE given limited mobility during hospitalization. Lovenox.  - GI ppx: Not indicated at this time. SUBJECTIVE:     Interval History: NAEO. Overnight having nausea which       OBJECTIVE:     Vital signs (last 24 hours):   Temp:  [36.8 ??C-38.4 ??C] 37.2 ??C  Heart Rate:  [87-104] 88  Resp:  [14-18] 14  BP: (121-132)/(76-83) 121/82  MAP (mmHg):  [92] 92  SpO2:  [98 %-100 %] 99 %    Intake/Output:  I/O last 3 completed shifts:  In: -   Out: 2300 [Urine:2300]    Physical Exam:    GEN: NAD, awake, alert  HEENT: Normcephalic, sclera anicteric   Cardio: No cyanosis or clubbing  Resp: Normal work of breathing, no retractions  Abd: non distended  Neuro: EOMI      Medications:    Scheduled   ??? aspirin chewable tablet 81 mg Daily   ??? atorvastatin (LIPITOR) tablet 20 mg Daily   ??? bisacodyL (DULCOLAX) suppository 10 mg QPM   ??? cefTRIAXone (ROCEPHIN) 1 g in sodium chloride 0.9 % (NS) 100 mL IVPB-connector bag Q24H   ??? docusate sodium (COLACE) capsule 100 mg TID   ??? dolutegravir (TIVICAY) TABLET Tab 50 mg daily   ??? econazole nitrate (SPECTAZOLE) 1 % cream BID   ???  emtricitabine-tenofovir alafen (Descovy) 200-25 mg tablet 1 tablet Daily   ??? enoxaparin (LOVENOX) syringe 40 mg Q24H SCH   ??? gabapentin (NEURONTIN) capsule 100 mg TID   ??? griseofulvin (GRIS-PEG) tablet 375 mg Daily   ??? leucovorin (WELLCOVORIN) tablet 10 mg Daily   ??? magnesium oxide (MAG-OX) tablet 400 mg Daily   ??? melatonin tablet 3 mg QPM   ??? nystatin (MYCOSTATIN) oral suspension 4x Daily   ??? senna (SENOKOT) tablet 2 tablet Daily with lunch   ??? SMOG ENEMA Once   ??? sulfaDIAZINE tablet 1,500 mg Q6H     PRN acetaminophen, 650 mg, Q4H PRN  calcium carbonate, 200 mg elem calcium, TID PRN  guaiFENesin, 100 mg, Q4H PRN  lidocaine 2% gel, 20 mL, Daily PRN  lidocaine 2% gel, 3 mL, Q4H PRN  ondansetron, 4 mg, Once PRN  ondansetron, 4 mg, Q6H PRN  simethicone, 80 mg, TID PRN  traMADoL, 25 mg, Q6H PRN  traMADoL, 50 mg, Q6H PRN      Continuous Infusions      Labs: I have reviewed the labs and studies from the last 24hrs.    CBC -   Recent Labs   Lab Units 01/23/19  0712 01/20/19 0503   WBC 10*9/L 6.5 5.9   RBC 10*12/L 4.17* 4.29*   HEMOGLOBIN g/dL 16.1* 09.6*   HEMATOCRIT % 36.1* 36.8*   MCV fL 86.5 85.7   MCH pg 28.6 28.9   MCHC g/dL 04.5 40.9   RDW % 81.1* 17.2*   PLATELET COUNT (1) 10*9/L 288 318   MPV fL 7.2 7.2     BMP -   Recent Labs   Lab Units 01/23/19  0712 01/20/19  0503   SODIUM mmol/L 135 137   POTASSIUM mmol/L 4.1 4.3   CHLORIDE mmol/L 100 101   CO2 mmol/L 27.0 23.0   BUN mg/dL 10 12   CREATININE mg/dL 9.14* 7.82   GLUCOSE mg/dL 956* 98           This patient is admitted to the Physical Medicine and Rehabilitation - Inpatient - A service, please page [phone] service, please contact the author of this note 8am-5pm on weekdays for questions regarding this patient. After hours and on weekends please contact the 1st call reisdent pager

## 2019-01-25 LAB — COMPREHENSIVE METABOLIC PANEL
ALBUMIN: 3.8 g/dL (ref 3.5–5.0)
ALKALINE PHOSPHATASE: 110 U/L (ref 38–126)
ALT (SGPT): 21 U/L (ref <50)
ANION GAP: 12 mmol/L (ref 7–15)
AST (SGOT): 22 U/L (ref 19–55)
BILIRUBIN TOTAL: 0.5 mg/dL (ref 0.0–1.2)
BLOOD UREA NITROGEN: 10 mg/dL (ref 7–21)
BUN / CREAT RATIO: 17
CALCIUM: 8.9 mg/dL (ref 8.5–10.2)
CHLORIDE: 96 mmol/L — ABNORMAL LOW (ref 98–107)
CO2: 27 mmol/L (ref 22.0–30.0)
CREATININE: 0.58 mg/dL — ABNORMAL LOW (ref 0.70–1.30)
EGFR CKD-EPI AA MALE: >90 mL/min/{1.73_m2} (ref >=60)
EGFR CKD-EPI NON-AA MALE: >90 mL/min/{1.73_m2} (ref >=60)
POTASSIUM: 4.2 mmol/L (ref 3.5–5.0)
PROTEIN TOTAL: 7.5 g/dL (ref 6.5–8.3)
SODIUM: 135 mmol/L (ref 135–145)

## 2019-01-25 LAB — CBC
HEMATOCRIT: 35.3 % — ABNORMAL LOW (ref 41.0–53.0)
MEAN CORPUSCULAR HEMOGLOBIN CONC: 32.9 g/dL (ref 31.0–37.0)
MEAN CORPUSCULAR VOLUME: 87.6 fL (ref 80.0–100.0)
MEAN PLATELET VOLUME: 8.1 fL (ref 7.0–10.0)
PLATELET COUNT: 276 10*9/L (ref 150–440)
RED CELL DISTRIBUTION WIDTH: 17.6 % — ABNORMAL HIGH (ref 12.0–15.0)
WBC ADJUSTED: 7 10*9/L (ref 4.5–11.0)

## 2019-01-25 LAB — ALBUMIN: Albumin:MCnc:Pt:Ser/Plas:Qn:: 3.8

## 2019-01-25 LAB — MEAN PLATELET VOLUME: Lab: 8.1

## 2019-01-25 NOTE — Unmapped (Signed)
For toxoplasmosis, following 6 week induction, we recommend maintenance dose pyrimethamine (50mg  qd) with leucovorin and sulfadiazine (1500mg  bid).      If pyrimethamine is not available, bactrim 1 DS tablet twice daily monotherapy may be used as an alternative regimen.    Chronic maintenance therapy (ie, secondary prophylaxis) for TE can be discontinued in asymptomatic patients who have completed initial therapy if they are receiving antiretroviral therapy (ART), have a suppressed HIV viral load, and have maintained a CD4 count >200 cells/microL for at least six months [1].     1. Panel on Opportunistic Infections in HIV-Infected Adults and Adolescents. Guidelines for the prevention and treatment of opportunistic infections in HIV-infected adults and adolescents: Recommendations from the Centers for Disease Control and Prevention, the [employer], and the HIV Medicine Association of the Infectious Diseases Society of [place]. http://webb-evans.org/.pdf [employer] on Sep 04, 2017).

## 2019-01-25 NOTE — Unmapped (Signed)
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   ??  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   - SLP for cognitive and swallow function.  - Neuropsych for higher level cognitive evaluation and coping.  - RT for community re-integration, education, and leisure support services.  - P&O for assistive devices PRN.  - Pharmacy consult for patient and family education on medication management.   - Nutrition consult for diet information/teaching.   - To be discussed in weekly Interdisciplinary Team Conference.  ??  ??  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 Workup significant for MRI showing increased T2 signal in the spinal cord from the level of T10 to the conus medullaris primarily involving the gray matter tracts with focal T2 hyperintensity in the dorsal columns at T10.   ?? Pain Control  ? Tylenol for mild, Tramadol for mod/severe  ? Neuropathic pain: Gabapentin 100mg    ?? Neurogenic Bladder  ? I/O cath q4H   ? Maintain cath volumes <500 cc   ?? Neurogenic Bowel: UMN Bowel  ? Admission KUB with moderate stool burden: s/p cleanout with Mag Citrate   ? 3:2:1 (Colace, Senna, Dulcolax suppository in evening + dig stim)   ?? Spasticity  ? No complaints of spasms at this time.  ?? Equipment/Support  ? Please apply PRAFO boots to bilateral feet while in bed to prevent ankle contractures  ?? DVT Prophylaxis  ? DVT ppx with Lovenox x 8 weeks (11/27)   ?? Skin  ? Patients with SCI are at higher risk for pressure injuries due to lack of sensation and inability to reposition self.  ? Turn patient Q2H to prevent pressure injuries  ? Maintain nursing skin integrity protocol with daily skin checks  ?? Autonomic Dysreflexia  ? Pt not at risk for AD given level  ?? Prognostication/Counseling  ? ASIA Exam - Pending  ? Education regarding patient's diagnosis, anticipated recovery, reintegration to the community, and fertility to be completed    ??  Klebsiella UTI: Patient febrile after admission. Urine culture growing Klebsiella. Given persistent fevers, ID was consulted.  - ID consulted, appreciate assistances  - Continue Ceftriaxone (10/3 - ) - plan for 7 day course   - f/u Urine sensitivities     CNS Toxoplasmosis: Gradually worsening LE weakness/numbness??progressing to lower extremity paralysis 8/22,. MRI spine notable for multiple spinal cord lesions (increased T2??signal from T10??to conus medullaris). MRI brain with??single enhancing??brain lesion to the right??corona radiata. Per radiology,??spinal cord findings most consistent with toxoplasmosis, brain lesion less consistent (not rim enhancing). LP 8/22 notable for elevated cells, lymphocytes, and protein, toxoplasmosis and EBV positive, negative cytology. Sulfadiazine, pyrimethamine, and leucovorin started 8/24 for toxoplasmosis.??Repeat LP 8/25,??again??negative for malignant cells.??CMV+ in the blood but not CSF.??He received??ganciclovir 8/22-8/25.????  - Continue Sulfadiazine,  pyrimethamine, and leucovorin for 6 weeks (8/24 - 10/5)  - Transition to maintenance  dose pyrimethamine 50mg  daily and sulfadiazine 1500mg  BID 10/5 --> pyrimethamine not available, started Bactrim DS 2XD   - Should pyrimethamine be unavailable outpatient, he can be placed on Bactrim DS 2XD.  ??  HIV/AIDS: Last CD4 = 231/14%. Diagnosed in 2007 and was treatment naive on admission (had previously declined ART due to religious reasons). Started Tivacay/Descovy 8/26.??  - Plan for switch to [facility] on discharge.  -  Dr. Dolores Frame has initiated process to coordinate outpatient follow-up and drug assistance enrollment.  - Will need bactrim prophylaxis for PJP (hold for now)   - Continue nystatin swishes for oral thrush  ????????????????????  Dry gangrene L hand (improving) ??  Etiology remains unclear, likely multifactorial. Bilateral UE arterial PVLs without any HD-significant findings and blood cultures obtained in the ED were negative. Has had extensive work up including multispecialty consultations including rheumatology, vascular surgery, hematology. Likely early atherosclerotic disease and recommended baby aspirin and agree with starting a statin (see ASCVD risk, below).   - Outpatient follow-up in hand clinic   - bandage changes daily   ??  Tinea pedis??and onychomycosis: Trichophyton confirmed on biopsy.   - Griseofulvin 375mg  BID x 6 months total (9/16 - )  - Continue econazole cream to bilateral feet BID  ??  ASCVD Risk: Pooled Cohort 10-yr risk is 3%, however, HIV is an independent risk factor for CVD which is not included in the risk score. Pt has atherosclerotic disease in his lower extremities and dyslipidemia.   - Atorvastatin 20mg  daily  - Aspirin 81mg  daily   ??  COVID-19 infection (resolved)  - Asymptomatic. Completed 5 days remdesivir.??  - Date of positive SARS-CoV-2 PCR: 12/09/2018 at OSH.   - Patient was 21 days from positive PCR on 9/9 and precautions discontinued. ????  ??  Insomnia  - Melatonin 3mg  qhs  ??  FEN/PPX:  - Diet: Regular   - Fluids: Encourage PO hydration.  - Electrolytes: Monitor and replete PRN.  - DVT ppx: High risk for VTE given limited mobility during hospitalization. Lovenox.  - GI ppx: Not indicated at this time.    SUBJECTIVE:     Interval History: NAEO. Doing well this morning. Denies abdominal pain, nausea/vomiting. No focal symptoms.     OBJECTIVE:     Vital signs (last 24 hours):   Temp:  [37 ??C-38.7 ??C] 37 ??C  Heart Rate:  [82-111] 96  Resp:  [18] 18  BP: (120-130)/(75-80) 121/75  MAP (mmHg):  [89-92] 89  SpO2:  [96 %-99 %] 99 %    Intake/Output:  I/O last 3 completed shifts:  In: -   Out: 2000 [Urine:2000]    Physical Exam:    GEN: NAD, awake, alert  HEENT: Normcephalic, sclera anicteric   Cardio: No cyanosis or clubbing  Resp: Normal work of breathing, no retractions  Abd: non distended  Neuro: EOMI      Medications:    Scheduled   ??? aspirin chewable tablet 81 mg Daily   ??? atorvastatin (LIPITOR) tablet 20 mg Daily   ??? bisacodyL (DULCOLAX) suppository 10 mg QPM   ??? cefTRIAXone (ROCEPHIN) 1 g in sodium chloride 0.9 % (NS) 100 mL IVPB-connector bag Q24H   ??? docusate sodium (COLACE) capsule 100 mg TID   ??? dolutegravir (TIVICAY) TABLET Tab 50 mg daily   ??? econazole nitrate (SPECTAZOLE) 1 % cream BID   ??? emtricitabine-tenofovir alafen (Descovy) 200-25 mg tablet 1 tablet Daily   ??? enoxaparin (LOVENOX) syringe 40 mg Q24H SCH   ???  gabapentin (NEURONTIN) capsule 100 mg TID   ??? griseofulvin (GRIS-PEG) tablet 375 mg Daily   ??? leucovorin (WELLCOVORIN) tablet 10 mg Daily   ??? magnesium oxide (MAG-OX) tablet 400 mg Daily   ??? melatonin tablet 3 mg QPM   ??? nystatin (MYCOSTATIN) oral suspension 4x Daily   ??? senna (SENOKOT) tablet 2 tablet Daily with lunch   ??? sulfaDIAZINE tablet 1,500 mg Q6H     PRN acetaminophen, 650 mg, Q4H PRN  calcium carbonate, 200 mg elem calcium, TID PRN  guaiFENesin, 100 mg, Q4H PRN  lidocaine 2% gel, 20 mL, Daily PRN  lidocaine 2% gel, 3 mL, Q4H PRN  ondansetron, 4 mg, Once PRN  ondansetron, 4 mg, Q6H PRN  simethicone, 80 mg, TID PRN  traMADoL, 25 mg, Q6H PRN  traMADoL, 50 mg, Q6H PRN        Labs: I have reviewed the labs and studies from the last 24hrs.        This patient is admitted to the Physical Medicine and Rehabilitation - Inpatient - A service, please page [phone] service, please contact the Thereasa Parkin of this note 8am-5pm on weekdays for questions regarding this patient. After hours and on weekends please contact the 1st call reisdent pager

## 2019-01-25 NOTE — Unmapped (Signed)
Care Management  Initial Transition Planning Assessment    Pt was working full time at [employer]	[place]. Spouse also works full time during the day. Voices spouse and his sister will somehow work out care for him at home during the day.               General  Care Manager assessed the patient by : In person interview with patient, Medical record review, Discussion with Clinical Care team  Orientation Level: Oriented X4  Who provides care at home?: N/A  Reason for referral: Discharge Planning         Medical Provider(s): No PCP Per Patient  Reason for Admission: Admitting Diagnosis:  cauda equina   Past Medical History:   has a past medical history of HIV (human immunodeficiency virus infection) (CMS-HCC).  Past Surgical History:   has a past surgical history that includes pr bx/remv,lymph node,deep axill (Left, 12/31/2018).   Previous admit date: 12/10/2018    Primary Insurance- Payor: BCBS / Plan: BCBS BLUE VALUE / Product Type: *No Product type* /   Secondary Insurance ??? None  Prescription Coverage ??? yes  Preferred Pharmacy - [facility]  [facility]    Transportation home: Private vehicle  Level of function prior to admission: Independent      Contact/Decision Maker    Patient Phone Number: [phone]    Extended Emergency Contact Information  Primary Emergency Contact: Veda Canning Iselin  Home Phone: [phone]  Relation: Spouse  Preferred language: SPANISH  Interpreter needed? Yes    Legal Next of Kin / Guardian / POA / Advance Directives       Advance Directive (Medical Treatment)  Does patient have an advance directive covering medical treatment?: Patient does not have advance directive covering medical treatment.  Reason patient does not have an advance directive covering medical treatment:: Patient does not wish to complete one at this time.    Health Care Decision Maker [HCDM] (Medical & Mental Health Treatment)  Healthcare Decision Maker: HCDM documented in the HCDM/Contact Info section.  Information offered on HCDM, Medical & Mental Health advance directives:: Patient declined information.    Advance Directive (Mental Health Treatment)  Does patient have an advance directive covering mental health treatment?: Patient does not have advance directive covering mental health treatment.  Reason patient does not have an advance directive covering mental health treatment:: HCDM documented in the HCDM/Contact Info section.    Patient Information  Lives with: Spouse/significant other, Children    Type of Residence: Private residence     Location: [address] [place] [address]/1 level home with 4-5 entrance steps and bilateral rails           Support Systems: Children, Spouse, Family Members    Responsibilities/Dependents at home?: Yes (Describe)(2 minor children and 7 year old daughter)    Home Care services in place prior to admission?: No                  Equipment Currently Used at Home: none       Currently receiving outpatient dialysis?: No       Financial Information       Need for financial assistance?: Yes  Type of financial assistance required: Community resources, Medication assistance, Medicaid application, Charity care, Care Management funds    Social Determinants of Health  Social Determinants of Health were addressed in provider documentation.  Please refer to patient  history.    Discharge Needs Assessment  Concerns to be Addressed: decision making, coping/stress, discharge planning    Clinical Risk Factors: New Diagnosis, Readmission < 72 Hours, Functional Limitations    Barriers to taking medications: No    Prior overnight hospital stay or ED visit in last 90 days: Yes    Readmission Within the Last 30 Days: planned readmission         Anticipated Changes Related to Illness: inability to care for self, inability to work    Equipment Needed After Discharge: other (see comments)(TBD)    Discharge Facility/Level of Care Needs: Readmission  Risk of Unplanned Readmission Score: UNPLANNED READMISSION SCORE: 14%  Predictive Model Details           14% (Medium) Factors Contributing to Score   Calculated 01/25/2019 16:03 35% Number of active Rx orders is 40   [place] Risk of Unplanned Readmission Model 12% ECG/EKG order is present in last 6 months     10% Encounter of ten days or longer in last year is present     8% Imaging order is present in last 6 months     8% Latest hemoglobin is low (11.6 g/dL)     6% Number of hospitalizations in last year is 1     6% Charlson Comorbidity Index is 4     6% Active anticoagulant Rx order is present     Readmitted Within the Last 30 Days? (No if blank) Yes  Patient at risk for readmission?: Yes    Discharge Plan  Screen findings are: Discharge planning needs identified or anticipated (Comment).    Expected Discharge Date:     Expected Transfer from Critical Care:      Patient and/or family were provided with choice of facilities / services that are available and appropriate to meet post hospital care needs?: Yes       Initial Assessment complete?: Yes

## 2019-01-25 NOTE — Unmapped (Signed)
GENERAL INFECTIOUS DISEASES CONSULT NOTE      Don [name] is a 43 y.o. male  being seen in consultation at the request of Sheryle Spray* for evaluation of new fever in setting of indwelling catheter.         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 He spiked a fever on 10/4 and ID was consulted for possible etiologies and management. Of note, he had an indwelling catheter placed on 9/24, which was removed on 10/3 with notable improvement. This is likely the source of his fever and should be continued on ceftriaxone until susceptibilities come back. The urine culture from 10/03 grew Klebsiella pneumoniae.       ID Problem List:  #New acute onset fever likely 2/2 indwelling catheter   9/24: Indwelling foley placed by urology   10/3: Indwelling catheter was removed   10/4: Pt spikes fever up to 38.7 with nausea and vomiting   10/5: Pt WBC down trending and improving clinically    Recommendations:  - Continue Ceftriaxone  - Obtain blood cultures and CXR   - Pending susceptibilities of Klebsiella     Thank you for involving [place] in the care of this patient. The General ID service - Team A will continue to follow.   Please call the General ID pager with questions at [phone].   Patient discussed with Dr. Para March.     Shawna Orleans, MD       History of Present Illness:    Patient is a 43 y/o M with PMHx of untreated HIV/AIDS (w/ CD4 142 on admission, diagnosed in 2007), COVID + (12/09/18) who presented on 8/20 to [facility] with dry gangrene on the 2nd and 3rd digits of L hand as well as increasing pain, weakness, and discoloration over his lower extremities. ID consulted about new acute onset of fever on 10/4 to help identify possible source and management.     His hospital stay has been complicated by cauda equina syndrome (LE paralysis, urinary retention), transverse myelitis. His CSF workup was positive for toxoplasma and EBV and CMV + in peripheral blood but negative in CSF. PET scan showing diffuse lymphadenopathy. L axillary excisional lymph node biopsy on 9/10 negative for malignancy or organisms. 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 He will continue this for 6 weeks. For his HIV/AIDS, he is on Tivicay and Descovy on 8/26 and will switch to [place] upon discharge. He previously has declined ART due to religious reasons.     He failed his spontaneous voiding trial on 9/24 and urology placed a Foley. He was discharged from [facility] on 10/2 and went to [facility]. ON 10/2, UA showed moderate leukocyte esterase, >100 WBCs, and occasional bacteria. On 10/3, he had a trial of void and urine was sent for culture for possible UTI. Pending sensitivities. Patient spiked a fever to 38.7 on 10/4 at 15:00 and was having nausea and vomiting. Ceftriaxone was started for UTI treatment. On 10/4, the urine culture grew Klebsiella pneumoniae. Pending sensitivities. Pt had foley removed at 4 PM yesterday. He was unable to void so I&O cath done this morning at 0814.    Source of information includes:  Review of medical records, discussion with patient,  discussion with treating providers.      Allergies:  Patient has no known allergies.      Medications:   Current antimicrobials:  IV Ceftriaxone 1 g q24h (10/3- ) for UTI   Griseofulvin 375 mg BID for tinea pedia  Nystatin [name] for oral thrush   Bactrim (10/5- )     Previous antimicrobials:  Sulfadiazine (8/24-10/5)  Pyrimethamine (8/24-10/2)  Leucovorin (8/24-10/5)  Ganciclovir (8/22-8/25)  Itraconazole [name] (9/14  Ivermectin (8/21-9/13)  Valacyclovir 1000 mg BID (10 days)    Immunomodulators:  None     Other medications reviewed.       Medical History:  Past Medical History:   Diagnosis Date   ??? HIV (human immunodeficiency virus infection) (CMS-HCC)        Surgical History:  Past Surgical History:   Procedure Laterality Date   ??? PR BX/REMV,LYMPH NODE,DEEP AXILL Left 12/31/2018    Procedure: BX/EXC LYMPH NODE; OPEN, DEEP AXILRY NODE;  Surgeon: [name], MD;  Location: MAIN OR [facility];  Service: Surgical Oncology       Social History:  Social History     Socioeconomic History   ??? Marital status: Married     Spouse name: Not on file   ??? Number of children: Not on file   ??? Years of education: Not on file   ??? Highest education level: Not on file   Occupational History   ??? Not on file   Social Needs   ??? Financial resource strain: Not on file   ??? Food insecurity     Worry: Not on file     Inability: Not on file   ??? Transportation needs     Medical: Not on file     Non-medical: Not on file   Tobacco Use   ??? Smoking status: Never Smoker   ??? Smokeless tobacco: Never Used   Substance and Sexual Activity   ??? Alcohol use: Not Currently   ??? Drug use: Not Currently   ??? Sexual activity: Not on file   Lifestyle   ??? Physical activity     Days per week: Not on file     Minutes per session: Not on file   ??? Stress: Not on file   Relationships   ??? Social Wellsite geologist on phone: Not on file     Gets together: Not on file     Attends religious service: Not on file     Active member of club or organization: Not on file     Attends meetings of clubs or organizations: Not on file     Relationship status: Not on file   Other Topics Concern   ??? Not on file   Social History Narrative   ??? Not on file     Tobacco use:   reports that he has never smoked. He has never used smokeless tobacco.   Alcohol use:    reports previous alcohol use.   Drug use:    reports previous drug use.   Living situation:  Lives with spouse/partner   Residence:   rural   [place] travel:   No [place] travel outside of [place]   International travel:   No travel outside of the [place] Engineer, maintenance service:  Has not served in the [employer]   Employment:  Employed as Company secretary in [employer]    Pets and animal exposure:  No animal exposure   Insect exposure:  No tick  exposure   Hobbies:  Denies unusual environmental exposures   TB exposures:  No known TB exposure   Sexual history:  Sex with women   Other significant exposures:  No other significant exposures      Family History:  Family History   Problem Relation Age of Onset   ??? Diabetes Sister    ??? Kidney disease Sister    ??? Alcohol abuse Brother    ??? Mental illness Other        Review of Systems:  12 systems reviewed and negative except as per HPI.       Objective:   Vital signs:  Temp:  [37 ??C-38.7 ??C] 37 ??C  Heart Rate:  [82-111] 96  Resp:  [18] 18  BP: (120-130)/(75-80) 121/75  MAP (mmHg):  [89-92] 89  SpO2:  [96 %-99 %] 99 %    Physical exam   General: Sitting up in bed, in no acute distress  HEENT: anicteric conjunctivae, moist mucous membranes, no oral lesions  NODES: no cervical or submandibular lymphadenopathy   LUNGS: normal work of breathing, clear to auscultation bilaterally without wheezes, rales, or ronchi  CARDIAC: regular rate and rhythm without murmurs, rubs, or gallops  ABDOMINAL: normal bowel sounds, soft, non-tender, non-distended  MSK: no lower extremity edema noted  SKIN: L hand wrapped up with 3rd digit amputation noted  GU: no suprapubic or CVA tenderness  NEUROLOGICAL: alert and oriented x3, spontaneously moving BUE.     Labs:        CBC CMP Inflam/Rheum/Heme   Lab Results   Component Value Date    WBC 7.0 01/25/2019    NEUTROABS 2.7 01/20/2019    LYMPHSABS 1.7 01/20/2019    EOSABS 0.9 (H) 01/20/2019    HGB 11.6 (L) 01/25/2019    HCT 35.3 (L) 01/25/2019    PLT 276 01/25/2019    Lab Results   Component Value Date    NA 135 01/25/2019    K 4.2 01/25/2019    CL 96 (L) 01/25/2019    CO2 27.0 01/25/2019    BUN 10 01/25/2019    CREATININE 0.58 (L) 01/25/2019    CALCIUM 8.9 01/25/2019    MG 1.8 01/20/2019    PHOS 3.5 11/05/2010     Lab Results   Component Value Date    ALKPHOS 110 01/25/2019    BILITOT 0.5 01/25/2019    BILIDIR 0.10 12/12/2018    PROT 7.5 01/25/2019    ALBUMIN 3.8 01/25/2019    ALT 21 01/25/2019    AST 22 01/25/2019    GGT 54 11/05/2010    Lab Results   Component Value Date    ESR 61 (H) 01/03/2019    ESR 45 (H) 12/10/2018    CRP 18.2 (H) 01/03/2019    CRP <5.0 12/23/2018     Lab Results   Component Value Date    INR 1.03 12/26/2018    INR 0.97 12/24/2018      Micro Serologies/Fungal Markers Drug Monitoring   Lab Results   Component Value Date    ANACX SKIN FLORA ISOLATED 12/14/2018    Lab Results   Component Value Date    EBV Quant 2,292 (H) 12/22/2018    No results found for: VANCORANDOM, VANCOTROUGH, TOBRAMYCIN, VORILVL, POSALVL, DESALKFLURQT, AMIKACIN, GENTPEAK, GENTM, GENTTROUGH   Positive labs for Serum CMV PCR, CSF EBV PCR, CSF Toxoplasma PCR   Negative for Serum parvovirus PCR, Quantiferon gold.    Microbiology:     Urine cultures: >100,000 Klebsiella  pneumoniae from catheterized indwelling catheter on 19/3     Respiratory cultures: COVID + on 12/09/2018      Radiology:   XR Abdomen 1 view 10/02:   IMPRESSION:  Nonobstructive bowel gas pattern. Moderate colonic stool burden centered about the cecum.

## 2019-01-25 NOTE — Unmapped (Signed)
Pt in bed resting quietly with eyes closed. Alert and oriented x 4. Denies any pain or discomfort at this time. Pt continues to have  fluctuating low  grade fever hovering around 99- 100 F.  Continues  on IV ABT  via  Right PIV. Pt is unable to void since removal of Foley at 4 PM. Dressings to 2nd digit of left finger and left foot are clean, dry and intact.  Pt encouraged to reposition Q hours for pressure relief and comfort. Prafo boots  to BLE in place.

## 2019-01-25 NOTE — Unmapped (Signed)
Patient A&O x 3, TOV, bowel program in the evening. Dressings to left 2nd digit intact. Patient has been febrile and throwing up this morning, throwing up subsided but continue to be febrile, tylenol administered MD notified, no other symptoms noted. Patient continue to have poor appetite. No c/o pain, no sign of distress noted. Call bell and phone within reach. Staff continue to monitor.  Problem: Rehabilitation (IRF) Plan of Care  Goal: Plan of Care Review  Outcome: Progressing  Goal: Patient-Specific Goal (Individualization)  Outcome: Progressing  Goal: Absence of Hospital-Acquired Illness or Injury  Outcome: Progressing  Goal: Home Safety Plan Established  Outcome: Progressing  Goal: Demonstration of Effective Coping Strategies  Outcome: Progressing  Goal: Community Reintegration Plan Established  Outcome: Progressing     Problem: Fall Injury Risk  Goal: Absence of Fall and Fall-Related Injury  Outcome: Progressing     Problem: Wound  Goal: Optimal Wound Healing  Outcome: Progressing     Problem: Skin Injury Risk Increased  Goal: Skin Health and Integrity  Outcome: Progressing     Problem: Self-Care Deficit  Goal: Improved Ability to Complete Activities of Daily Living  Outcome: Progressing

## 2019-01-25 NOTE — Unmapped (Signed)
Alert and oriented x4. Vital signs stable. Staff managing I&O cath and bowel program. Pt reports adequate pain management. Dressing changed as ordered. Bed in low position and call bell in reach. Will continue to monitor and follow care plan.     Problem: Rehabilitation (IRF) Plan of Care  Goal: Plan of Care Review  Outcome: Not Progressing  Goal: Patient-Specific Goal (Individualization)  Outcome: Not Progressing  Goal: Absence of Hospital-Acquired Illness or Injury  Outcome: Not Progressing  Goal: Home Safety Plan Established  Outcome: Not Progressing  Goal: Demonstration of Effective Coping Strategies  Outcome: Not Progressing  Goal: Community Reintegration Plan Established  Outcome: Not Progressing     Problem: Fall Injury Risk  Goal: Absence of Fall and Fall-Related Injury  Outcome: Not Progressing     Problem: Wound  Goal: Optimal Wound Healing  Outcome: Not Progressing     Problem: Skin Injury Risk Increased  Goal: Skin Health and Integrity  Outcome: Not Progressing     Problem: Self-Care Deficit  Goal: Improved Ability to Complete Activities of Daily Living  Outcome: Not Progressing

## 2019-01-25 NOTE — Unmapped (Signed)
Recreational Therapy Evaluation  01/25/2019     Patient Name:  Don [name]       Medical Record Number: [id number]   Date of Birth: 1976/01/16  Sex: Male          Room/Bed:  7300/7300-01    Eval Duration: 41 Min.    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 Pt was seen by LRT this PM to complete initial RT assessment. Pt received sitting upright in w/c in the PT gym with in-person Spanish interpreter, Homero Fellers, who was present throughout the session. Pt appeared in good spirits and did not report any pain or discomfort. Pt given w/c escort to RT tx area to complete assessment. LRT provided education regarding RT tx services and established rapport. Pt identified various leisure interests, primarily involving his passion for working with his church and helping others. Pt stated It is my passion. Pt reported strong support system of family members/friends and verbalized independent utilization of various healthy coping mechanisms, with faith-based background as preferred method of coping. Pt expressed appreciation for tx session with LRT and given w/c escort back to his room, where LRT provided MinA for slideboard transfer from w/c to bed. Pt left lying in bed in his room, with call bell/all immediate needs within reach. Pt would continue to benefit from RT tx services to investigate adaptations/modifications to premorbid leisure interests and promote increased activity tolerance with scheduled 4th hour of therapy. In addition, LRT will provide resources to pt/family as needed for emotional/social support and community reintegration. Will continue with RT POC and goals as listed below; see full assessment for details.     Plan of Care  1x per day for: 1-2x week   Planned Treatment Duration 3 weeks    Patient's Identified Treatment Goal: work my butt off to be able to walk again  Treatment Plan developed in collaboration with: Patient, Treatment Team  Interventions: Adaptive equipment, Adjustment to hospitalization, Health Care Encounters and Medical Condition, Barrier education, Community reintegration, Discharge planning, Education - Family / caregiver, Education - Patient, Printmaker, Goal setting, Leisure, Mindfulness, Management consultant, Stress management, Wellness / recovery     Goals:  Within 4 tx sessions, pt will participate in at least 2 OOB leisure/wellness pursuits for >30 minutes to promote increased OOB activity tolerance with scheduled 4th hour of therapy.   Prior to d/c, after education, pt will be able to identify at least 2 resources/interventions to assist with emotional/social support and community reintegration upon d/c.      Subjective    Current Situation: Pt received sitting upright in w/c in PT gym with in-person Spanish interpreter, Homero Fellers.  Cognitive, Emotional, Physical, Social, and Leisure/Life functioning were assessed:: Patient Interviews, Review of Chart, Treatment Team, Observation in Activities/Interventions, No family present  Living Environment: Mobile home  Lives With: Family  Precautions: Falls precautions  Equipment / Environment: Patient wearing mask for full session, Caregiver wearing mask for full session(LRT donned eye protection)  Reports/displays signs/symptoms of pain?: No  Add'l Session Information: No family/caregiver present    Past Medical History:   Diagnosis Date   ??? HIV (human immunodeficiency virus infection) (CMS-HCC)     Social History     Tobacco Use   ???  Smoking status: Never Smoker   ??? Smokeless tobacco: Never Used   Substance Use Topics   ??? Alcohol use: Not Currently      Past Surgical History:   Procedure Laterality Date   ??? PR BX/REMV,LYMPH NODE,DEEP AXILL Left 12/31/2018    Procedure: BX/EXC LYMPH NODE; OPEN, DEEP AXILRY NODE;  Surgeon: [name], MD;  Location: MAIN OR [facility];  Service: Surgical Oncology    Family History   Problem Relation Age of Onset   ??? Diabetes Sister    ??? Kidney disease Sister    ??? Alcohol abuse Brother    ??? Mental illness Other         Allergies: Patient has no known allergies.     Objective    Cognitive  Thought Process/Content: Organized  Judgment: Intact  Memory: Independent with recall  Follows Directions: Able to follow directions independently  Attention Span/Alertness : Able to attend to RT assessment  Medical understanding: Would benefit from additional dx/tx education', Knows name of diagnosis and/or medical equipment, Able to explain treatments/procedures related to medical condition  Orientation: Fully oriented    Emotional  Mood: Euthymic, Pleasant, Hopeful  Affect: Congruent  Anxiety Management : Reports no anxiety  Pain Management : Reports pain that is situational  Frustration Tolerance: Manages frustration independently with non-pharmacological skills  Coping Skills : Reports independent practice of healthy coping strategies  Adjustment: Effectively adjusts  Body Image/Self concept: Manages with coping skills/support  Emotional Expression: Independently expresses feelings  Additional Emotional Domain comments: Pt reported positive adjustment to dx and prolonged hospitalization. Pt with strong support system of family/friends and identified strong faith-based background as preferred method of coping.    Physical Domain  Mobility Comments: Per PT, pt is not ambulating at this time; See PT assessment and notes for full evaluation of mobility.  Upper extremity function: Per OT, pt is MinA-MaxA for ADLs; See OT assessment and notes for full evaluation of UE function.  Lower extremity function: Per PT, pt is currently transferring with MinA using slide board; See PT assessment and notes for full evaluation of LE function.    Social  Support system: Reports positive support system  Assertiveness: Independent with assertiveness  Patient Behaviors and Interactions: Appropriate  Ability to form relationship / interact with others: Able to form social relationships independently  Additional Social Domain Comments: Pt reported he is very involved through his church and is passionate about helping his church community by spreading positive messages to others.     Leisure and Life Function  Level of involvement: Daily participation  Community reintegration / barrier education: Requires min cues to safely problem solve community barriers., Requires mod cues to safely problem solve community barriers.  Adaptability for Leisure and life functioning: Requires min cues to investigate adaptations, Requires mod cues to investigate adaptations  Motivation to engage in leisure / play: Yes  Quality of participation: Involved in healthy leisure though dissatisfied with barriers, Involved in healthy leisure and verbalizes plans to overcome barriers  Additional Leisure and Life Function Comments: Pt expressed interest in the following leisure pursuits: swimming, parks, walking, reading the bible, sitting outside, cooking, grilling, volunteering/involvement at church, helping others, restaurants, shopping, puzzles, and spending time with his family.    I attest that I have reviewed the above information.  Signed by Jed Limerick, LRT/CTRS, CBIS  Filed 01/25/2019

## 2019-01-26 DIAGNOSIS — B370 Candidal stomatitis: Secondary | ICD-10-CM

## 2019-01-26 DIAGNOSIS — G834 Cauda equina syndrome: Secondary | ICD-10-CM

## 2019-01-26 DIAGNOSIS — E785 Hyperlipidemia, unspecified: Secondary | ICD-10-CM

## 2019-01-26 DIAGNOSIS — I70203 Unspecified atherosclerosis of native arteries of extremities, bilateral legs: Secondary | ICD-10-CM

## 2019-01-26 DIAGNOSIS — I96 Gangrene, not elsewhere classified: Secondary | ICD-10-CM

## 2019-01-26 DIAGNOSIS — B353 Tinea pedis: Secondary | ICD-10-CM

## 2019-01-26 DIAGNOSIS — G4700 Insomnia, unspecified: Secondary | ICD-10-CM

## 2019-01-26 DIAGNOSIS — B351 Tinea unguium: Secondary | ICD-10-CM

## 2019-01-26 DIAGNOSIS — G0489 Other myelitis: Secondary | ICD-10-CM

## 2019-01-26 DIAGNOSIS — Z8619 Personal history of other infectious and parasitic diseases: Secondary | ICD-10-CM

## 2019-01-26 DIAGNOSIS — K592 Neurogenic bowel, not elsewhere classified: Secondary | ICD-10-CM

## 2019-01-26 DIAGNOSIS — G629 Polyneuropathy, unspecified: Secondary | ICD-10-CM

## 2019-01-26 DIAGNOSIS — B20 Human immunodeficiency virus [HIV] disease: Secondary | ICD-10-CM

## 2019-01-26 DIAGNOSIS — G8222 Paraplegia, incomplete: Secondary | ICD-10-CM

## 2019-01-26 DIAGNOSIS — B582 Toxoplasma meningoencephalitis: Secondary | ICD-10-CM

## 2019-01-26 DIAGNOSIS — D72820 Lymphocytosis (symptomatic): Secondary | ICD-10-CM

## 2019-01-26 DIAGNOSIS — I70268 Atherosclerosis of native arteries of extremities with gangrene, other extremity: Secondary | ICD-10-CM

## 2019-01-26 DIAGNOSIS — N319 Neuromuscular dysfunction of bladder, unspecified: Secondary | ICD-10-CM

## 2019-01-26 LAB — CBC
HEMATOCRIT: 35.1 % — ABNORMAL LOW (ref 41.0–53.0)
HEMOGLOBIN: 11.5 g/dL — ABNORMAL LOW (ref 13.5–17.5)
MEAN CORPUSCULAR HEMOGLOBIN: 28.8 pg (ref 26.0–34.0)
MEAN CORPUSCULAR VOLUME: 87.5 fL (ref 80.0–100.0)
MEAN PLATELET VOLUME: 8 fL (ref 7.0–10.0)
PLATELET COUNT: 289 10*9/L (ref 150–440)
RED BLOOD CELL COUNT: 4.01 10*12/L — ABNORMAL LOW (ref 4.50–5.90)
RED CELL DISTRIBUTION WIDTH: 17.6 % — ABNORMAL HIGH (ref 12.0–15.0)
WBC ADJUSTED: 5.9 10*9/L (ref 4.5–11.0)

## 2019-01-26 LAB — COMPREHENSIVE METABOLIC PANEL
ALBUMIN: 3.9 g/dL (ref 3.5–5.0)
ALKALINE PHOSPHATASE: 96 U/L (ref 38–126)
ALT (SGPT): 19 U/L (ref <50)
ANION GAP: 14 mmol/L (ref 7–15)
AST (SGOT): 21 U/L (ref 19–55)
BILIRUBIN TOTAL: 0.4 mg/dL (ref 0.0–1.2)
BLOOD UREA NITROGEN: 15 mg/dL (ref 7–21)
BUN / CREAT RATIO: 23
CALCIUM: 9 mg/dL (ref 8.5–10.2)
CHLORIDE: 99 mmol/L (ref 98–107)
CO2: 24 mmol/L (ref 22.0–30.0)
CREATININE: 0.66 mg/dL — ABNORMAL LOW (ref 0.70–1.30)
EGFR CKD-EPI AA MALE: >90 mL/min/{1.73_m2} (ref >=60)
EGFR CKD-EPI NON-AA MALE: >90 mL/min/{1.73_m2} (ref >=60)
GLUCOSE RANDOM: 109 mg/dL (ref 70–179)
POTASSIUM: 4.4 mmol/L (ref 3.5–5.0)
PROTEIN TOTAL: 7.3 g/dL (ref 6.5–8.3)
SODIUM: 137 mmol/L (ref 135–145)

## 2019-01-26 LAB — MEAN CORPUSCULAR VOLUME: Lab: 87.5

## 2019-01-26 LAB — AST (SGOT): Aspartate aminotransferase:CCnc:Pt:Ser/Plas:Qn:: 21

## 2019-01-26 NOTE — Unmapped (Signed)
A&O: x3, c/o finger pain, pain med given per pt's request, breathing regular & unlabored, chest expanded symmetrically, pt is on regular diet, meals: set up, pt is on I&O cath q4hrs & bowel program, dressing for L middle finger & L foot & buttocks: c/d/I, pt is on falls precautions, bed is in low position, call bell is within reach, will continue monitoring pt's conditions      Problem: Rehabilitation (IRF) Plan of Care  Goal: Plan of Care Review  Outcome: Progressing  Goal: Patient-Specific Goal (Individualization)  Outcome: Progressing  Goal: Absence of Hospital-Acquired Illness or Injury  Outcome: Progressing  Goal: Home Safety Plan Established  Outcome: Progressing  Goal: Demonstration of Effective Coping Strategies  Outcome: Progressing  Goal: Community Reintegration Plan Established  Outcome: Progressing     Problem: Fall Injury Risk  Goal: Absence of Fall and Fall-Related Injury  Outcome: Progressing     Problem: Wound  Goal: Optimal Wound Healing  Outcome: Progressing     Problem: Skin Injury Risk Increased  Goal: Skin Health and Integrity  Outcome: Progressing     Problem: Self-Care Deficit  Goal: Improved Ability to Complete Activities of Daily Living  Outcome: Progressing     Problem: Sensory Impairment  Goal: Sensory Impairment Goal: Effective Self-Management of Sensory Impairment  Outcome: Progressing     Problem: Tissue Perfusion Altered  Goal: Improved Tissue Perfusion  Outcome: Progressing

## 2019-01-26 NOTE — Unmapped (Signed)
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   ??  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   - SLP for cognitive and swallow function.  - Neuropsych for higher level cognitive evaluation and coping.  - RT for community re-integration, education, and leisure support services.  - P&O for assistive devices PRN.  - Pharmacy consult for patient and family education on medication management.   - Nutrition consult for diet information/teaching.   - To be discussed in weekly Interdisciplinary Team Conference.  ??  ??  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 Workup significant for MRI showing increased T2 signal in the spinal cord from the level of T10 to the conus medullaris primarily involving the gray matter tracts with focal T2 hyperintensity in the dorsal columns at T10.   ?? Pain Control  ? Tylenol for mild, Tramadol for mod/severe  ? Neuropathic pain: Gabapentin 100mg    ?? Neurogenic Bladder  ? I/O cath q4H   ? Maintain cath volumes <500 cc   ?? Neurogenic Bowel: UMN Bowel  ? Admission KUB with moderate stool burden: s/p cleanout with Mag Citrate   ? 3:2:1 (Colace, Senna, Dulcolax suppository in evening + dig stim)   ?? Spasticity  ? No complaints of spasms at this time.  ?? Equipment/Support  ? Please apply PRAFO boots to bilateral feet while in bed to prevent ankle contractures  ?? DVT Prophylaxis  ? DVT ppx with Lovenox x 8 weeks (11/27)   ?? Skin  ? Patients with SCI are at higher risk for pressure injuries due to lack of sensation and inability to reposition self.  ? Turn patient Q2H to prevent pressure injuries  ? Maintain nursing skin integrity protocol with daily skin checks  ?? Autonomic Dysreflexia  ? Pt not at risk for AD given level  ?? Prognostication/Counseling  ? ASIA Exam - Pending  ? Education regarding patient's diagnosis, anticipated recovery, reintegration to the community, and fertility to be completed    ??  Klebsiella UTI: Patient febrile after admission. Urine culture growing Klebsiella that is ampicillin and TMP-SMX resistant. Given persistent fevers, ID was consulted and recommended blood cx and CXR.  Lungs clear on CXR 10/6.    - ID consulted, appreciate assistance  - Continue Ceftriaxone (10/3 - ) - plan for 7 day course   - f/u blood cx per ID recs    CNS Toxoplasmosis: Gradually worsening LE weakness/numbness??progressing to lower extremity paralysis 8/22,. MRI spine notable for multiple spinal cord lesions (increased T2??signal from T10??to conus medullaris). MRI brain with??single enhancing??brain lesion to the right??corona radiata. Per radiology,??spinal cord findings most consistent with toxoplasmosis, brain lesion less consistent (not rim enhancing). LP 8/22 notable for elevated cells, lymphocytes, and protein, toxoplasmosis and EBV positive, negative cytology. Sulfadiazine, pyrimethamine, and leucovorin started 8/24 for toxoplasmosis.??Repeat LP 8/25,??again??negative for malignant cells.??CMV+ in the blood but not  CSF.??He received??ganciclovir 8/22-8/25.????  - S/p induction therapy with Sulfadiazine,  pyrimethamine, and leucovorin for 6 weeks (8/24 - 10/5)  - Bactrim SS BID (maintenance dose; pyrimethamine not available) (10/5 - )  ??  HIV/AIDS: Last CD4 = 231/14%. Diagnosed in 2007 and was treatment naive on admission (had previously declined ART due to religious reasons). Started Tivacay/Descovy 8/26.??  - Plan for switch to [facility] on discharge.  -  Dr. Dolores Frame has initiated process to coordinate outpatient follow-up and drug assistance enrollment.  - Will need bactrim prophylaxis for PJP once therapy for Toxo completed   - Continue nystatin swishes for oral thrush  ????????????????????  Dry gangrene L hand (improving) ??  Etiology remains unclear, likely multifactorial. Bilateral UE arterial PVLs without any HD-significant findings and blood cultures obtained in the ED were negative. Has had extensive work up including multispecialty consultations including rheumatology, vascular surgery, hematology. Likely early atherosclerotic disease and recommended baby aspirin and agree with starting a statin (see ASCVD risk, below).   - Outpatient follow-up in hand clinic   - bandage changes daily   ??  Tinea pedis??and onychomycosis: Trichophyton confirmed on biopsy.   - Griseofulvin 375mg  BID x 6 months total (9/16 - )  - Continue econazole cream to bilateral feet BID  ??  ASCVD Risk: Pooled Cohort 10-yr risk is 3%, however, HIV is an independent risk factor for CVD which is not included in the risk score. Pt has atherosclerotic disease in his lower extremities and dyslipidemia.   - Atorvastatin 20mg  daily  - Aspirin 81mg  daily   ??  COVID-19 infection (resolved)  - Asymptomatic. Completed 5 days remdesivir.??  - Date of positive SARS-CoV-2 PCR: 12/09/2018 at OSH.   - Patient was 21 days from positive PCR on 9/9 and precautions discontinued. ????  ??  Insomnia  - Melatonin 3mg  qhs  ??  FEN/PPX:  - Diet: Regular   - Fluids: Encourage PO hydration.  - Electrolytes: Monitor and replete PRN.  - DVT ppx: High risk for VTE given limited mobility during hospitalization. Lovenox.  - GI ppx: Not indicated at this time.    SUBJECTIVE:     Interval History: NAEO. Doing well this morning. Had BM with bowel program.  PVRs WNL.  CXR shows clear lungs.  Blood cx drawn this AM per ID recs.    OBJECTIVE: Vital signs (last 24 hours):   Temp:  [36.7 ??C (98.1 ??F)] 36.7 ??C (98.1 ??F)  Heart Rate:  [73-86] 73  Resp:  [17-18] 18  BP: (121-124)/(78-81) 124/78  MAP (mmHg):  [80-94] 80  SpO2:  [100 %] 100 %    Intake/Output:  I/O last 3 completed shifts:  In: -   Out: 2085 [Urine:2085]    Physical Exam:    GEN: NAD, awake, alert  HEENT: Normcephalic, sclera anicteric   Cardio: No cyanosis or clubbing  Resp: Normal work of breathing, no retractions  Abd: non distended  Neuro: EOMI      Medications:    Scheduled   ??? aspirin chewable tablet 81 mg Daily   ??? atorvastatin (LIPITOR) tablet 20 mg Daily   ??? bisacodyL (DULCOLAX) suppository 10 mg QPM   ??? cefTRIAXone (ROCEPHIN) 1 g in sodium chloride 0.9 % (NS) 100 mL IVPB-connector bag Q24H   ??? docusate sodium (COLACE) capsule 100 mg TID   ??? dolutegravir (TIVICAY) TABLET Tab 50 mg daily   ??? econazole nitrate (SPECTAZOLE) 1 % cream BID   ??? emtricitabine-tenofovir alafen (Descovy) 200-25 mg tablet 1 tablet  Daily   ??? enoxaparin (LOVENOX) syringe 40 mg Q24H SCH   ??? gabapentin (NEURONTIN) capsule 100 mg TID   ??? griseofulvin (GRIS-PEG) tablet 375 mg Daily   ??? magnesium oxide (MAG-OX) tablet 400 mg Daily   ??? melatonin tablet 3 mg QPM   ??? nystatin (MYCOSTATIN) oral suspension 4x Daily   ??? senna (SENOKOT) tablet 2 tablet Daily with lunch   ??? sulfamethoxazole-trimethoprim (BACTRIM DS) 800-160 mg tablet 160 mg of trimethoprim Q12H SCH     PRN acetaminophen, 650 mg, Q4H PRN  calcium carbonate, 200 mg elem calcium, TID PRN  guaiFENesin, 100 mg, Q4H PRN  lidocaine 2% gel, 20 mL, Daily PRN  lidocaine 2% gel, 3 mL, Q4H PRN  ondansetron, 4 mg, Once PRN  ondansetron, 4 mg, Q6H PRN  simethicone, 80 mg, TID PRN  traMADoL, 25 mg, Q6H PRN  traMADoL, 50 mg, Q6H PRN        Labs: I have reviewed the labs and studies from the last 24hrs.        This patient is admitted to the Physical Medicine and Rehabilitation - Inpatient - A service, please page [phone] service, please contact the Thereasa Parkin of this note 8am-5pm on weekdays for questions regarding this patient. After hours and on weekends please contact the 1st call reisdent pager       Delaine Lame, MS4           I attest that I have reviewed the student note, and that the components of the history of the present illness, the physical exam, and the assessment and plan documented were performed by me or were performed in my presence by the student where I verified the documentation and performed (or re-performed) the exam and medical decision making.   Sueanne Margarita D.O., PGY-2

## 2019-01-26 NOTE — Unmapped (Addendum)
**Note De-Identified Don [name] Obfuscation** I & O cath q 4 hours and bowel program q pm. One complaint of pain at finger amputation site; tramadol given with good relief. No falls or injuries. Dressings CDI. Will continue to monitor.

## 2019-01-26 NOTE — Unmapped (Signed)
WOCN Consult Services                                                                 Wound Evaluation     Reason for Consult:   - Initial  - Wound    Problem List:   Principal Problem:    Bilateral leg weakness    Assessment: Ramzi Torralba??is a 43 y.o.??male??with??previously untreated HIV/AIDS??(CD4 142 on admission) who initially presented with dry gangrene to the 2nd and 3rd digits of L hand and pain/weakness/discoloration over his lower extremities, subsequently found to be COVID??+ without respiratory difficulties (now off precautions). 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     We received a consult for possible pressure injury to bilateral ankle joint. Patient was assessed and found to have overgrown mycotic toe nails, hyperpigmented skin on both feet, intact skin on both heels and ankle joint and a small open area on the anterior/lateral aspect of his left foot, etiology of wound is unknown.     Wound type: full thickness wound     Location:left foot     Present on Admission:no    Tissue type: moist and pink     Surrounding tissue: intact dry hyperpigmented skin    Dimensions:less than 0.5cm    Drainage:scant amount of bloody exudate     Tunnel:none     Undermining:none     Induration:none    Infection:none      Lab Results   Component Value Date    WBC 5.9 01/26/2019    HGB 11.5 (L) 01/26/2019    HCT 35.1 (L) 01/26/2019    ESR 61 (H) 01/03/2019    CRP 18.2 (H) 01/03/2019    A1C 5.5 01/04/2019    GLUF 99 11/05/2010    GLU 109 01/26/2019    POCGLU 124 01/23/2019    ALBUMIN 3.9 01/26/2019    PROT 7.3 01/26/2019     Lower Extremity Distal Pulses:   - Dorsalis Pedis (DP) - Right: Palpable  - Dorsalis Pedis (DP) - Left: Palpable     Support Surface:   - Low Air Loss    Offloading:  Left: Pillow  Right: Pillow    Type Debridement Completed By WOCN:  N/A    Teaching:  - Turning and repositioning  - Wound care    WOCN Recommendations:   - See nursing orders for wound care instructions.  - Contact WOCN with questions, concerns, or wound deterioration.    Topical Therapy/Interventions:   - Foam    1. Cleanse left foot wound with Normal Saline and 4 x 4 gauze, pat dry.  2. Use non-alcohol skin barrier wipe ([id number]) to periwound and let dry 15 seconds.  3. Apply Mepilex silicone bordered foam  to wound.   4. Change every other day/PRN if dislodged, soiled or saturated.    Recommended Consults:  - Not Applicable    WOCN Follow Up:  - Weekly    Plan of Care Discussed With:   - Patient    Supplies Ordered: No    Workup Time:   30 minutes     Durward Fortes  MSN, BSN, FNP-C, [facility]  Phone: [phone]  Pager: [phone]

## 2019-01-26 NOTE — Unmapped (Signed)
GENERAL INFECTIOUS DISEASES CONSULT NOTE    Don [name] is a 43 y.o. male  being seen in consultation at the request of Sheryle Spray* for evaluation and management of HIV and  Urinary tract infection.     Assessment:  Patient is a 43 year old M with a PMHx of untreated HIV/AIDS, COVID+ who presented to [facility] on 8/20 with dry gangrene of 2nd and 3rd digits of L hand and worsening pain and weakness in LE. He spiked a fever on 10/4 and ID was consulted for possible etiologies and management. Of note, he had an indwelling catheter placed on 9/24, which was removed on 10/3 with notable improvement. This is likely the source of his fever and should be continued on ceftriaxone for K. pneumoniae (this could be transitioned to PO levofloxacin or cprofloxacin to complete course if PO antibiotic is desired). COntinue HIV care and toxo therapy as previously indicated from ID discharge - patient has follow up in ID clinic set for 10/26      ID Problem List:  #New acute onset fever likely 2/2 indwelling catheter   -9/24: Indwelling foley placed by urology   -10/3: Indwelling catheter was removed   -10/02 XR Abd: Nonobstructive bowel gas pattern. Moderate colonic stool burden centered about the cecum.  -10/03 UCx grew >100,000 CFU/mL Klebsiella pneumoniae (R-ampicillin, trimethoprim+sulfamethoxazole; S-ampicillin+sulbactam, cefazolin, cephalexin, ciprofloxacin, gentamycin, levofloxacin, nitrofloxacin, tobramycin)  -10/4: Pt spikes fever up to 38.7 with nausea and vomiting   -10/5: Pt WBC 5.9, down from 10.1 on 10/04  -10/5 no fever, 36.7 C on 10/05  -10/06 BCx pending    #HIV/AIDS??  Diagnosed in 2007    treatment naive to this hospitalization ??His CD4 count was 142 and VL was 341,000 (8/20).   Started Tivacay/Descovy 8/26 - Most recent VL 117 on 9/16.  To see Dr. Dolores Frame for outpatient HIV management    # CNS toxoplasmosis  Complicated by cauda equina syndrome  S/p 6 week induction therapy with sulfadiazene, pyremethamine, leucovorin  Now on TMP/SMX maintenance  ??  Recommendations:  - continue ceftriaxone to complete 10 day course (end date 10/12) - can complete with PO levofloxacin if need to switch to PO regimen.   - continue  TMP/SMX bid for toxo if pyremethamine not available.  - continue dolutegravir + emtracitabine/tenofivir in house and DC on bictegravir/emtracitabine/tenofivir as previously planned      Thank you for involving [place] in the care of this patient. The General ID service Team A will sign off.   Please call the General ID pager with questions at [phone].       Verlin Dike, MD,PhD  Infectious Disease Attending        Interval events and ROS:  Overnight the patient was A&O x3 and c/o finger pain relieved with pain medication Patient had a small, soft bowel movement. Patient denies abdominal pain and back pain, fever resolved.      Allergies:  Patient has no known allergies.      Medications:   Current antimicrobials:  IV Ceftriaxone 1 g q24h (10/3- ) for UTI   Griseofulvin 375 mg BID for tinea pedia (9/16-)  Nystatin PO for oral thrush (8/20-)  Bactrim (10/5- )     Previous antimicrobials:  Sulfadiazine (8/24-10/5)  Pyrimethamine (8/24-10/2)  Leucovorin (8/24-10/5)  Ganciclovir (8/22-8/25)  Itraconazole PO (9/14  Ivermectin (8/21-9/13)  Valacyclovir 1000 mg BID (10 days) (9/18-9/28)  Cefepime (8/20)  vanc 8/20)  Immunomodulators:  None    Other medications reviewed.  Objective:   Vital signs:  Temp:  [36.7 ??C (98.1 ??F)] 36.7 ??C (98.1 ??F)  Heart Rate:  [73-86] 73  Resp:  [17-18] 18  BP: (121-124)/(78-81) 124/78  MAP (mmHg):  [80-94] 80  SpO2:  [100 %] 100 %    Physical exam   General: Sitting up in bed, in no acute distress  HEENT: anicteric conjunctivae, moist mucous membranes, no oral lesions  NODES: no cervical or submandibular lymphadenopathy   LUNGS: normal work of breathing, clear to auscultation bilaterally without wheezes, rales, or ronchi  CARDIAC: regular rate and rhythm without murmurs, rubs, or gallops  ABDOMINAL: normal bowel sounds, soft, non-tender, non-distended  MSK: no lower extremity edema noted  SKIN: L hand wrapped up with 3rd digit amputation noted  GU: no suprapubic or CVA tenderness  NEUROLOGICAL: alert and oriented x3, spontaneously moving BUE.       Labs:    CBC BMP LFTs   Lab Results   Component Value Date    WBC 5.9 01/26/2019    NEUTROABS 2.7 01/20/2019    LYMPHSABS 1.7 01/20/2019    EOSABS 0.9 (H) 01/20/2019    HGB 11.5 (L) 01/26/2019    HCT 35.1 (L) 01/26/2019    PLT 289 01/26/2019    Lab Results   Component Value Date    NA 137 01/26/2019    K 4.4 01/26/2019    CL 99 01/26/2019    CO2 24.0 01/26/2019    BUN 15 01/26/2019    CREATININE 0.66 (L) 01/26/2019    CALCIUM 9.0 01/26/2019    MG 1.8 01/20/2019    PHOS 3.5 11/05/2010    Lab Results   Component Value Date    ALKPHOS 96 01/26/2019    BILITOT 0.4 01/26/2019    BILIDIR 0.10 12/12/2018    PROT 7.3 01/26/2019    ALBUMIN 3.9 01/26/2019    ALT 19 01/26/2019    AST 21 01/26/2019    GGT 54 11/05/2010      Serologies/Fungal Markers Inflam/Rheum Other   Lab Results   Component Value Date    EBV Quant 2,292 (H) 12/22/2018    Lab Results   Component Value Date    ESR 61 (H) 01/03/2019    ESR 45 (H) 12/10/2018    CRP 18.2 (H) 01/03/2019    CRP <5.0 12/23/2018           Drug monitoring:  No results found for: VANCORANDOM, VANCOTROUGH, TOBRAMYCIN, VORILVL, POSALVL, DESALKFLURQT, AMIKACIN, Rufina Falco      Microbiology:    Microbiology Results (last day)     Procedure Component Value Date/Time Date/Time    Blood Culture #2 [[id number]] Collected: 01/26/19 0743    Lab Status: In process Specimen: Blood from 1 Peripheral Draw Updated: 01/26/19 0800    Blood Culture #1 [[id number]] Collected: 01/26/19 0742    Lab Status: In process Specimen: Blood from 1 Peripheral Draw Updated: 01/26/19 0800    Urine Culture [[id number]]  (Abnormal)  (Susceptibility) Collected: 01/23/19 0905    Lab Status: Final result Specimen: Urine from Catheterized-Indwelling Catheter Updated: 01/25/19 1529     Urine Culture, Comprehensive >100,000 CFU/mL Klebsiella pneumoniae    Narrative:      Specimen Source: Catheterized-Indwelling Catheter    Susceptibility     Klebsiella pneumoniae (1)     Antibiotic Interpretation Method Status    Ampicillin Resistant KIRBY BAUER Final    Ampicillin + Sulbactam Susceptible KIRBY BAUER Final    Cefazolin Susceptible KIRBY BAUER Final  Cephalexin Susceptible KIRBY BAUER Final     For uncomplicated UTI's only.  Also predicts response for cefdinir, cefpodoxime and cefuroxime.     Ciprofloxacin Susceptible KIRBY BAUER Final    Gentamicin Susceptible KIRBY BAUER Final    Levofloxacin Susceptible KIRBY BAUER Final    Nitrofurantoin Susceptible KIRBY BAUER Final    Tobramycin Susceptible KIRBY BAUER Final    Trimethoprim + Sulfamethoxazole Resistant KIRBY BAUER Final                             Studies:   10/6 CXR- clear lung fields      Scribe's Attestation: Verlin Dike, MD obtained and performed the history, physical exam and medical decision making elements that were entered into the chart. Documentation assistance was provided by me personally. Signed by Verdie Mosher, Scribe, on January 26, 2019 at 10:45 AM.     Provider???s Attestation:   Documentation assistance provided by the Scribe, [employer]. I was present during the time the encounter was recorded. The information recorded by the Scribe was done at my direction and has been reviewed and validated by me, Verlin Dike, MD. January 26, 2019 at 10:45 AM

## 2019-01-27 DIAGNOSIS — I96 Gangrene, not elsewhere classified: Secondary | ICD-10-CM

## 2019-01-27 LAB — CBC
HEMATOCRIT: 36 % — ABNORMAL LOW (ref 41.0–53.0)
HEMOGLOBIN: 12 g/dL — ABNORMAL LOW (ref 13.5–17.5)
MEAN CORPUSCULAR HEMOGLOBIN CONC: 33.2 g/dL (ref 31.0–37.0)
MEAN CORPUSCULAR VOLUME: 87 fL (ref 80.0–100.0)
MEAN PLATELET VOLUME: 7.6 fL (ref 7.0–10.0)
PLATELET COUNT: 315 10*9/L (ref 150–440)
RED BLOOD CELL COUNT: 4.14 10*12/L — ABNORMAL LOW (ref 4.50–5.90)
RED CELL DISTRIBUTION WIDTH: 17.2 % — ABNORMAL HIGH (ref 12.0–15.0)
WBC ADJUSTED: 6.2 10*9/L (ref 4.5–11.0)

## 2019-01-27 LAB — COMPREHENSIVE METABOLIC PANEL
ALBUMIN: 3.8 g/dL (ref 3.5–5.0)
ALKALINE PHOSPHATASE: 114 U/L (ref 38–126)
ALT (SGPT): 19 U/L (ref <50)
ANION GAP: 12 mmol/L (ref 7–15)
AST (SGOT): 21 U/L (ref 19–55)
CALCIUM: 9.1 mg/dL (ref 8.5–10.2)
CHLORIDE: 100 mmol/L (ref 98–107)
CO2: 25 mmol/L (ref 22.0–30.0)
CREATININE: 0.59 mg/dL — ABNORMAL LOW (ref 0.70–1.30)
EGFR CKD-EPI AA MALE: >90 mL/min/{1.73_m2} (ref >=60)
EGFR CKD-EPI NON-AA MALE: >90 mL/min/{1.73_m2} (ref >=60)
GLUCOSE RANDOM: 112 mg/dL (ref 70–179)
POTASSIUM: 4.4 mmol/L (ref 3.5–5.0)
PROTEIN TOTAL: 7.5 g/dL (ref 6.5–8.3)
SODIUM: 137 mmol/L (ref 135–145)

## 2019-01-27 LAB — WBC ADJUSTED: Leukocytes:NCnc:Pt:Bld:Qn:: 6.2

## 2019-01-27 LAB — CREATININE: Creatinine:MCnc:Pt:Ser/Plas:Qn:: 0.59 — ABNORMAL LOW

## 2019-01-27 NOTE — Unmapped (Signed)
Order was placed for a PIV by Venous Access Team (VAT).  Patient was assessed for placement of a PIV. Access was obtained. Blood return noted.  Dressing intact and device well secured.  Flushed with normal saline.  Pt advised to inform RN of any s/s of discomfort at the PIV site.    Workup / Procedure Time:  15 minutes        RN was notified.       Thank you,     Fredderick Erb RN Venous Access Team

## 2019-01-27 NOTE — Unmapped (Signed)
A&O: x3, c/o finger pain, pain med given per pt's request, breathing regular & unlabored, chest expanded symmetrically, pt is on regular diet, meals: set up, pt is on I&O cath q4hrs & bowel program, dressing for L middle finger & L foot & buttocks: c/d/I, pt is on falls precautions, bed is in low position, call bell is within reach, will continue monitoring pt's conditions      Problem: Rehabilitation (IRF) Plan of Care  Goal: Plan of Care Review  Outcome: Progressing  Goal: Patient-Specific Goal (Individualization)  Outcome: Progressing  Goal: Absence of Hospital-Acquired Illness or Injury  Outcome: Progressing  Goal: Home Safety Plan Established  Outcome: Progressing  Goal: Demonstration of Effective Coping Strategies  Outcome: Progressing  Goal: Community Reintegration Plan Established  Outcome: Progressing     Problem: Fall Injury Risk  Goal: Absence of Fall and Fall-Related Injury  Outcome: Progressing     Problem: Wound  Goal: Optimal Wound Healing  Outcome: Progressing     Problem: Skin Injury Risk Increased  Goal: Skin Health and Integrity  Outcome: Progressing     Problem: Self-Care Deficit  Goal: Improved Ability to Complete Activities of Daily Living  Outcome: Progressing     Problem: Sensory Impairment  Goal: Sensory Impairment Goal: Effective Self-Management of Sensory Impairment  Outcome: Progressing     Problem: Tissue Perfusion Altered  Goal: Improved Tissue Perfusion  Outcome: Progressing

## 2019-01-27 NOTE — Unmapped (Signed)
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   ??  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   - SLP for cognitive and swallow function.  - Neuropsych for higher level cognitive evaluation and coping.  - RT for community re-integration, education, and leisure support services.  - P&O for assistive devices PRN.  - Pharmacy consult for patient and family education on medication management.   - Nutrition consult for diet information/teaching.   - To be discussed in weekly Interdisciplinary Team Conference.  ??  ??  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 Workup significant for MRI showing increased T2 signal in the spinal cord from the level of T10 to the conus medullaris primarily involving the gray matter tracts with focal T2 hyperintensity in the dorsal columns at T10.   ?? Pain Control  ? Tylenol for mild, Tramadol for mod/severe  ? Neuropathic pain: Gabapentin 100mg    ?? Neurogenic Bladder  ? I/O cath q6H (changed 10/7   ? Maintain cath volumes <500 cc   ?? Neurogenic Bowel: UMN Bowel  ? Admission KUB with moderate stool burden: s/p cleanout with Mag Citrate   ? 3:2:1 (Colace, Senna, Dulcolax suppository in evening + dig stim) up to [facility]   ?? Spasticity  ? No complaints of spasms at this time.  ?? Equipment/Support  ? Please apply PRAFO boots to bilateral feet while in bed to prevent ankle contractures  ?? DVT Prophylaxis  ? DVT ppx with Lovenox x 8 weeks (11/27)   ?? Skin  ? Patients with SCI are at higher risk for pressure injuries due to lack of sensation and inability to reposition self.  ? Turn patient Q2H to prevent pressure injuries  ? Maintain nursing skin integrity protocol with daily skin checks  ?? Autonomic Dysreflexia  ? Pt not at risk for AD given level  ?? Prognostication/Counseling  ? ASIA Exam - Pending  ? Education regarding patient's diagnosis, anticipated recovery, reintegration to the community, and fertility to be completed    ??  Klebsiella UTI: Patient febrile after admission. Urine culture growing Klebsiella that is ampicillin and TMP-SMX resistant. Given persistent fevers, ID was consulted and recommended blood cx and CXR.  Lungs clear on CXR 10/6.  Blood cultures show no growth at 24 hours on 10/7.  - ID consulted, appreciate assistance  - D/c Ceftriaxone (10/3 - 10/6) and start Levofloxacin 750mg  po qd (10/7 - ) - plan for 10 day course   - f/u blood cx per ID recs    CNS Toxoplasmosis: Gradually worsening LE weakness/numbness??progressing to lower extremity paralysis 8/22,. MRI spine notable for multiple spinal cord lesions (increased T2??signal from T10??to conus medullaris). MRI brain with??single enhancing??brain lesion to the right??corona radiata. Per radiology,??spinal cord findings most consistent with toxoplasmosis, brain lesion less consistent (not rim enhancing). LP 8/22 notable for elevated cells, lymphocytes, and protein, toxoplasmosis  and EBV positive, negative cytology. Sulfadiazine, pyrimethamine, and leucovorin started 8/24 for toxoplasmosis.??Repeat LP 8/25,??again??negative for malignant cells.??CMV+ in the blood but not CSF.??He received??ganciclovir 8/22-8/25.????Was initially switched to Bactrim for maintenance notes per ID recs, but switched to sulfadiazine/pyrimethamine due to concerns from pharmacy.  - S/p induction therapy with Sulfadiazine, pyrimethamine, and leucovorin for 6 weeks (8/24 - 10/5)  - D/c Bactrim SS BID (maintenance dose) (10/5 - 10/6)  - Continue Sulfadiazine/Pyrimethamine/Leuovorin (maintenance dose) (10/7 - )  ??  HIV/AIDS: Last CD4 = 231/14%. Diagnosed in 2007 and was treatment naive on admission (had previously declined ART due to religious reasons). Started Tivacay/Descovy 8/26.??  - Plan for switch to [facility] on discharge.  - Dr. Dolores Frame has initiated process to coordinate outpatient follow-up and drug assistance enrollment.  - Will need bactrim prophylaxis for PJP once therapy for Toxo completed (hold for now)  - Continue nystatin swishes for oral thrush  ????????????????????  Dry gangrene L hand (improving) ??  Etiology remains unclear, likely multifactorial. Bilateral UE arterial PVLs without any HD-significant findings and blood cultures obtained in the ED were negative. Has had extensive work up including multispecialty consultations including rheumatology, vascular surgery, hematology. Likely early atherosclerotic disease and recommended baby aspirin and agree with starting a statin (see ASCVD risk, below).   - Outpatient follow-up in hand clinic   - bandage changes daily   ??  Tinea pedis??and onychomycosis: Trichophyton confirmed on biopsy.   - Griseofulvin 375mg  BID x 6 months total (9/16 - )  - Continue econazole cream to bilateral feet BID    Nausea  - Ondansetron prn  - EKG to check for QT prolongation  ??  ASCVD Risk: Pooled Cohort 10-yr risk is 3%, however, HIV is an independent risk factor for CVD which is not included in the risk score. Pt has atherosclerotic disease in his lower extremities and dyslipidemia.   - Atorvastatin 20mg  daily  - Aspirin 81mg  daily   ??  COVID-19 infection (resolved)  - Asymptomatic. Completed 5 days remdesivir.??  - Date of positive SARS-CoV-2 PCR: 12/09/2018 at OSH.   - Patient was 21 days from positive PCR on 9/9 and precautions discontinued. ????  ??  Insomnia  - Melatonin 3mg  qhs  ??  FEN/PPX:  - Diet: Regular   - Fluids: Encourage PO hydration.  - Electrolytes: Monitor and replete PRN.  - DVT ppx: High risk for VTE given limited mobility during hospitalization. Lovenox.  - GI ppx: Not indicated at this time.    SUBJECTIVE:     Interval History: NAEO. Had some pain with his hand that improved with use of prn tramadol.  Doing well this morning. Had BM with scant, soft discharge with bowel program.  No accidents.  PVRs 280-332mL.  EKG 2/2 continued use of Zofran.  Switched from Bactrim back to sulfadiazine/pyrimethamine due to concern from pharmacist.  D/ced IV CTX and started levofloxacin po for Klebsiella UTI.  Blood cx show no growth at 24 hours.    OBJECTIVE:     Vital signs (last 24 hours):   Temp:  [36.8 ??C (98.2 ??F)] 36.8 ??C (98.2 ??F)  Heart Rate:  [81-88] 81  Resp:  [16-18] 18  BP: (114-122)/(72-79) 122/79  MAP (mmHg):  [85-92] 92  SpO2:  [100 %] 100 %    Intake/Output:  I/O last 3 completed shifts:  In: -   Out: 2620 [Urine:2620]    Physical Exam:    GEN: NAD, awake, alert  HEENT: Normcephalic, sclera anicteric   Cardio:  No cyanosis or clubbing  Resp: Normal work of breathing, no retractions  Abd: non distended  Neuro: EOMI      Medications:    Scheduled   ??? aspirin chewable tablet 81 mg Daily   ??? atorvastatin (LIPITOR) tablet 20 mg Daily   ??? bisacodyL (DULCOLAX) suppository 10 mg QPM   ??? docusate sodium (COLACE) capsule 100 mg TID   ??? dolutegravir (TIVICAY) TABLET Tab 50 mg daily   ??? econazole nitrate (SPECTAZOLE) 1 % cream BID   ??? emtricitabine-tenofovir alafen (Descovy) 200-25 mg tablet 1 tablet Daily   ??? enoxaparin (LOVENOX) syringe 40 mg Q24H SCH   ??? gabapentin (NEURONTIN) capsule 100 mg TID   ??? griseofulvin (GRIS-PEG) tablet 375 mg Daily   ??? leucovorin (WELLCOVORIN) tablet 10 mg Daily   ??? levoFLOXacin (LEVAQUIN) tablet 750 mg Q24H SCH   ??? magnesium oxide (MAG-OX) tablet 400 mg Daily   ??? melatonin tablet 3 mg QPM   ??? nystatin (MYCOSTATIN) oral suspension 4x Daily   ??? pyrimethamine (DARAPRIM) oral suspension Daily   ??? senna (SENOKOT) tablet 2 tablet Daily with lunch   ??? sulfaDIAZINE tablet 1,500 mg BID     PRN acetaminophen, 650 mg, Q4H PRN  calcium carbonate, 200 mg elem calcium, TID PRN  guaiFENesin, 100 mg, Q4H PRN  lidocaine 2% gel, 20 mL, Daily PRN  lidocaine 2% gel, 3 mL, Q4H PRN  ondansetron, 4 mg, Q6H PRN  simethicone, 80 mg, TID PRN  traMADoL, 25 mg, Q6H PRN  traMADoL, 50 mg, Q6H PRN        Labs: I have reviewed the labs and studies from the last 24hrs.        This patient is admitted to the Physical Medicine and Rehabilitation - Inpatient - A service, please page [phone] service, please contact the Thereasa Parkin of this note 8am-5pm on weekdays for questions regarding this patient. After hours and on weekends please contact the 1st call reisdent pager       Delaine Lame, MS4           I attest that I have reviewed the student note, and that the components of the history of the present illness, the physical exam, and the assessment and plan documented were performed by me or were performed in my presence by the student where I verified the documentation and performed (or re-performed) the exam and medical decision making.   Sueanne Margarita D.O., PGY-2

## 2019-01-28 LAB — COMPREHENSIVE METABOLIC PANEL
ALBUMIN: 3.9 g/dL (ref 3.5–5.0)
ALT (SGPT): 19 U/L (ref <50)
ANION GAP: 13 mmol/L (ref 7–15)
AST (SGOT): 21 U/L (ref 19–55)
BILIRUBIN TOTAL: 0.4 mg/dL (ref 0.0–1.2)
BLOOD UREA NITROGEN: 11 mg/dL (ref 7–21)
BUN / CREAT RATIO: 19
CHLORIDE: 100 mmol/L (ref 98–107)
CO2: 24 mmol/L (ref 22.0–30.0)
CREATININE: 0.59 mg/dL — ABNORMAL LOW (ref 0.70–1.30)
EGFR CKD-EPI AA MALE: >90 mL/min/{1.73_m2} (ref >=60)
EGFR CKD-EPI NON-AA MALE: >90 mL/min/{1.73_m2} (ref >=60)
GLUCOSE RANDOM: 118 mg/dL (ref 70–179)
POTASSIUM: 4.3 mmol/L (ref 3.5–5.0)
PROTEIN TOTAL: 7.6 g/dL (ref 6.5–8.3)
SODIUM: 137 mmol/L (ref 135–145)

## 2019-01-28 LAB — CBC
HEMOGLOBIN: 12.4 g/dL — ABNORMAL LOW (ref 13.5–17.5)
MEAN CORPUSCULAR HEMOGLOBIN CONC: 33.1 g/dL (ref 31.0–37.0)
MEAN CORPUSCULAR HEMOGLOBIN: 28.7 pg (ref 26.0–34.0)
MEAN CORPUSCULAR VOLUME: 86.9 fL (ref 80.0–100.0)
MEAN PLATELET VOLUME: 8.5 fL (ref 7.0–10.0)
PLATELET COUNT: 327 10*9/L (ref 150–440)
RED BLOOD CELL COUNT: 4.3 10*12/L — ABNORMAL LOW (ref 4.50–5.90)
RED CELL DISTRIBUTION WIDTH: 17.1 % — ABNORMAL HIGH (ref 12.0–15.0)
WBC ADJUSTED: 4.6 10*9/L (ref 4.5–11.0)

## 2019-01-28 LAB — BILIRUBIN TOTAL: Bilirubin:MCnc:Pt:Ser/Plas:Qn:: 0.4

## 2019-01-28 LAB — RED CELL DISTRIBUTION WIDTH: Lab: 17.1 — ABNORMAL HIGH

## 2019-01-28 NOTE — Unmapped (Signed)
Pt alert and oriented x 4, mostly Spanish speaking. Requires q 6hr caths and daily bowel program (1800)- see note. Wound dressing changed overnight, C/D/I. PIV removed today without complication-- pt now on oral abx. Rash to feet with anti-fungal cream applied. Denies pain, except does c/o pain during catheterizations. Cooperative with therapies-- interpreter present with therapy. Interpreter phone placed at bedside. CNA reported very large, formed  BM this morning (incontinent). Pt reports no sensation below knees BLE.    Problem: Rehabilitation (IRF) Plan of Care  Goal: Plan of Care Review  Outcome: Progressing  Goal: Patient-Specific Goal (Individualization)  Outcome: Progressing  Goal: Absence of Hospital-Acquired Illness or Injury  Outcome: Progressing  Goal: Home Safety Plan Established  Outcome: Progressing  Goal: Demonstration of Effective Coping Strategies  Outcome: Progressing  Goal: Community Reintegration Plan Established  Outcome: Progressing     Problem: Fall Injury Risk  Goal: Absence of Fall and Fall-Related Injury  Outcome: Progressing     Problem: Skin Injury Risk Increased  Goal: Skin Health and Integrity  Outcome: Progressing

## 2019-01-28 NOTE — Unmapped (Signed)
Patient is A&Ox4. Patient has I&O cath Q6 hrs, 1800 bowel program, last BM was 01/27/19. Used PRN lidocaine jelly for catheterizations, but patient still experienced pain during caths.  Patient denied muscle pain during shift. Safety and skin protocol maintained. Bed in lowest position, bed alarm in place, call bell within reach, will continue to monitor.     Problem: Rehabilitation (IRF) Plan of Care  Goal: Plan of Care Review  Outcome: Progressing  Goal: Patient-Specific Goal (Individualization)  Outcome: Progressing  Goal: Absence of Hospital-Acquired Illness or Injury  Outcome: Progressing  Goal: Home Safety Plan Established  Outcome: Progressing  Goal: Demonstration of Effective Coping Strategies  Outcome: Progressing  Goal: Community Reintegration Plan Established  Outcome: Progressing     Problem: Fall Injury Risk  Goal: Absence of Fall and Fall-Related Injury  Outcome: Progressing     Problem: Wound  Goal: Optimal Wound Healing  Outcome: Progressing     Problem: Skin Injury Risk Increased  Goal: Skin Health and Integrity  Outcome: Progressing     Problem: Self-Care Deficit  Goal: Improved Ability to Complete Activities of Daily Living  Outcome: Progressing     Problem: Sensory Impairment  Goal: Sensory Impairment Goal: Effective Self-Management of Sensory Impairment  Outcome: Progressing     Problem: Tissue Perfusion Altered  Goal: Improved Tissue Perfusion  Outcome: Progressing

## 2019-01-28 NOTE — Unmapped (Signed)
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   ??  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   - SLP for cognitive and swallow function.  - Neuropsych for higher level cognitive evaluation and coping.  - RT for community re-integration, education, and leisure support services.  - P&O for assistive devices PRN.  - Pharmacy consult for patient and family education on medication management.   - Nutrition consult for diet information/teaching.   - To be discussed in weekly Interdisciplinary Team Conference.  ??  ??  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 Workup significant for MRI showing increased T2 signal in the spinal cord from the level of T10 to the conus medullaris primarily involving the gray matter tracts with focal T2 hyperintensity in the dorsal columns at T10.   ?? Pain Control  ? Tylenol for mild, Tramadol for mod/severe  ? Neuropathic pain: Gabapentin 100mg  TID   ?? Neurogenic Bladder  ? I/O cath q6H (changed 10/7)   ? Maintain cath volumes <500 cc   ?? Neurogenic Bowel: UMN Bowel  ? Admission KUB with moderate stool burden: s/p cleanout with Mag Citrate   ? 3:2:1 (Colace, Senna, Dulcolax suppository in evening + dig stim) up to [facility]   ? Start Miralax qd at lunch   ?? Spasticity  ? No complaints of spasms at this time.  ?? Equipment/Support  ? PRAFO boots to bilateral feet while in bed to prevent ankle contractures  ?? DVT Prophylaxis  ? DVT ppx with Lovenox x 8 weeks (11/27)   ?? Skin  ? Turn patient Q2H to prevent pressure injuries  ? Maintain nursing skin integrity protocol with daily skin checks  ?? Autonomic Dysreflexia  ? Pt not at risk for AD given level  ?? Prognostication/Counseling  ? ASIA Exam - Pending  ? Education regarding patient's diagnosis, anticipated recovery, reintegration to the community, and fertility to be completed    ??  Klebsiella UTI: Patient febrile after admission. Urine culture growing Klebsiella that is ampicillin and TMP-SMX resistant. Given persistent fevers, ID was consulted and recommended blood cx and CXR.  Lungs clear on CXR 10/6.  Blood cultures show no growth at 48 hours on 10/8.   - ID consulted, appreciate assistance  - S/p Ceftriaxone (10/3 - 10/6)  - Levofloxacin 750mg  po qd (10/7 - ) - plan for 10 day course     CNS Toxoplasmosis: Gradually worsening LE weakness/numbness??progressing to lower extremity paralysis 8/22,. MRI spine notable for multiple spinal cord lesions (increased T2??signal from T10??to conus medullaris). MRI brain with??single enhancing??brain lesion to the right??corona radiata. Per radiology,??spinal cord findings most consistent with toxoplasmosis, brain lesion less consistent (not rim enhancing). LP 8/22 notable for elevated cells, lymphocytes, and protein, toxoplasmosis and EBV positive, negative cytology. Sulfadiazine, pyrimethamine, and leucovorin started 8/24 for toxoplasmosis.??Repeat LP 8/25,??again??negative for malignant cells.??CMV+ in the blood but  not CSF.??He received??ganciclovir 8/22-8/25.????Was initially switched to Bactrim (10/5-10/6) for maintenance notes per ID recs, but switched to sulfadiazine/pyrimethamine (10/7 - ) due to concerns from pharmacy.  - S/p induction therapy with Sulfadiazine,  pyrimethamine, and leucovorin for 6 weeks (8/24 - 10/5)  - Sulfadiazine/Pyrimethamine/Leuovorin (maintenance dose) (10/7 - )  ??  HIV/AIDS: Last CD4 = 231/14%. Diagnosed in 2007 and was treatment naive on admission (had previously declined ART due to religious reasons). Started Tivacay/Descovy 8/26.??  - Plan for switch to [facility] on discharge.  - Dr. Dolores Frame has initiated process to coordinate outpatient follow-up and drug assistance enrollment.  - Will need bactrim prophylaxis for PJP once therapy for Toxo completed (hold for now)  - Continue nystatin swishes for oral thrush  ????????????????????  Dry gangrene L hand (improving) ??  Etiology remains unclear, likely multifactorial. Bilateral UE arterial PVLs without any HD-significant findings and blood cultures obtained in the ED were negative. Has had extensive work up including multispecialty consultations including rheumatology, vascular surgery, hematology. Likely early atherosclerotic disease and recommended baby aspirin and agree with starting a statin (see ASCVD risk, below).   - Outpatient follow-up in hand clinic   - bandage changes daily   ??  Tinea pedis??and onychomycosis: Trichophyton confirmed on biopsy.   - Griseofulvin 375mg  BID x 6 months total (9/16 - )  - Continue econazole cream to bilateral feet BID    Nausea EKG 10/7 shows normal sinus rhythm, no QT prolongation  - Ondansetron prn  ??  ASCVD Risk: Pooled Cohort 10-yr risk is 3%, however, HIV is an independent risk factor for CVD which is not included in the risk score. Pt has atherosclerotic disease in his lower extremities and dyslipidemia.   - Atorvastatin 20mg  daily  - Aspirin 81mg  daily   ??  COVID-19 infection (resolved)  - Asymptomatic. Completed 5 days remdesivir.??  - Date of positive SARS-CoV-2 PCR: 12/09/2018 at OSH.   - Patient was 21 days from positive PCR on 9/9 and precautions discontinued. ????  ??  Insomnia  - Melatonin 3mg  qhs  ??  FEN/PPX:  - Diet: Regular   - Fluids: Encourage PO hydration.  - Electrolytes: Monitor and replete PRN.  - DVT ppx: High risk for VTE given limited mobility during hospitalization. Lovenox.  - GI ppx: Not indicated at this time.    SUBJECTIVE:     Interval History: NAEO. Had BM with scant, soft discharge with bowel program.  Had very large, formed BM this morning.  Pt endorsed that he also had a BM yesterday morning.  PVRs 400-5101mL with I&O cath q6 hours.  EKG yesterday showed normal sinus rhythm with no QT interval prolongation.  Blood cx show no growth at 48 hours.    OBJECTIVE:     Vital signs (last 24 hours):   Temp:  [36.4 ??C (97.5 ??F)-36.8 ??C (98.2 ??F)] 36.4 ??C (97.5 ??F)  Heart Rate:  [82-99] 82  Resp:  [14-18] 14  BP: (118-121)/(83-84) 121/83  SpO2:  [99 %-100 %] 99 %    Intake/Output:  I/O last 3 completed shifts:  In: -   Out: 2260 [Urine:2260]    Physical Exam:    GEN: NAD, awake, alert  HEENT: Normcephalic, sclera anicteric   Cardio: No cyanosis or clubbing  Resp: Normal work of breathing, no retractions  Abd: non distended  Neuro: EOMI      Medications:    Scheduled   ??? aspirin chewable tablet 81 mg Daily   ??? atorvastatin (LIPITOR) tablet 20  mg Daily   ??? bisacodyL (DULCOLAX) suppository 10 mg QPM   ??? docusate sodium (COLACE) capsule 100 mg TID   ??? dolutegravir (TIVICAY) TABLET Tab 50 mg daily   ??? econazole nitrate (SPECTAZOLE) 1 % cream BID   ??? emtricitabine-tenofovir alafen (Descovy) 200-25 mg tablet 1 tablet Daily   ??? enoxaparin (LOVENOX) syringe 40 mg Q24H SCH   ??? gabapentin (NEURONTIN) capsule 100 mg TID   ??? griseofulvin (GRIS-PEG) tablet 375 mg Daily   ??? leucovorin (WELLCOVORIN) tablet 10 mg Daily   ??? levoFLOXacin (LEVAQUIN) tablet 750 mg Q24H SCH   ??? magnesium oxide (MAG-OX) tablet 400 mg Daily   ??? melatonin tablet 3 mg QPM   ??? nystatin (MYCOSTATIN) oral suspension 4x Daily   ??? pyrimethamine (DARAPRIM) oral suspension Daily   ??? senna (SENOKOT) tablet 2 tablet Daily with lunch   ??? sulfaDIAZINE tablet 1,500 mg BID     PRN acetaminophen, 650 mg, Q4H PRN  calcium carbonate, 200 mg elem calcium, TID PRN  guaiFENesin, 100 mg, Q4H PRN  lidocaine 2% gel, 20 mL, Daily PRN  lidocaine 2% gel, 3 mL, Q4H PRN  ondansetron, 4 mg, Q6H PRN  simethicone, 80 mg, TID PRN  traMADoL, 25 mg, Q6H PRN  traMADoL, 50 mg, Q6H PRN        Labs: I have reviewed the labs and studies from the last 24hrs.        This patient is admitted to the Physical Medicine and Rehabilitation - Inpatient - A service, please page [phone] service, please contact the Thereasa Parkin of this note 8am-5pm on weekdays for questions regarding this patient. After hours and on weekends please contact the 1st call reisdent pager       Delaine Lame, MS4           I attest that I have reviewed the student note, and that the components of the history of the present illness, the physical exam, and the assessment and plan documented were performed by me or were performed in my presence by the student where I verified the documentation and performed (or re-performed) the exam and medical decision making.   Don [name] D.O., PGY-2

## 2019-01-28 NOTE — Unmapped (Signed)
[facility] Consult Services  ADVANCED FOOT AND NAIL CARE CONSULT     Reason For Consult:  - Initial  - Nail Care    Assessment:  Received order for [facility] to complete Advanced Foot and Nail Care Service.  Patient reports that he has not been able to independently cut toenails in awhile.    The patient has never been to a Podiatrist.  The Patient is agreeable to [facility] completing nail care.    Nail Assessment:  -  Toe Nail:  unusually long and thick  Patient has a dressing on left anterior foot that is covered with a dressing.    Procedure:  -  Patient in need of toenail cleaning and cutting only.  -  All debris removed from below nail surface with cuticle stick, nails cut with sterile clippers, then nails filed with nail file.  -  Feet cleaned and moisturizer applied.  -  Patient tolerated the procedure well    Instruments Used:  -  Sterile nail clippers  -  100/180 grit nail file  -  cuticle stick    Plan:  -  Instructed the Patient the risk of ulceration, infection, and trauma with overgrown nails. Reviewed the importance of following up with [facility], Podiatrist or PCP to continue to maintain nail care/length.    Workup Time:  60 minutes     Charissa Bash, RN, BSN, [employer], [employer]  Wound Ostomy Consult Service

## 2019-01-28 NOTE — Unmapped (Signed)
A&O: x3, no c/o pain, breathing regular & unlabored, chest expanded symmetrically, pt is on regular diet, meals: set up, pt is on I&O cath q4hrs & bowel program, dressing for L middle finger & L foot & buttocks: c/d/I, pt is on falls precautions, bed is in low position, call bell is within reach, will continue monitoring pt's conditions      Problem: Rehabilitation (IRF) Plan of Care  Goal: Plan of Care Review  Outcome: Progressing  Goal: Patient-Specific Goal (Individualization)  Outcome: Progressing  Goal: Absence of Hospital-Acquired Illness or Injury  Outcome: Progressing  Goal: Home Safety Plan Established  Outcome: Progressing  Goal: Demonstration of Effective Coping Strategies  Outcome: Progressing  Goal: Community Reintegration Plan Established  Outcome: Progressing     Problem: Fall Injury Risk  Goal: Absence of Fall and Fall-Related Injury  Outcome: Progressing     Problem: Wound  Goal: Optimal Wound Healing  Outcome: Progressing     Problem: Skin Injury Risk Increased  Goal: Skin Health and Integrity  Outcome: Progressing     Problem: Self-Care Deficit  Goal: Improved Ability to Complete Activities of Daily Living  Outcome: Progressing     Problem: Sensory Impairment  Goal: Sensory Impairment Goal: Effective Self-Management of Sensory Impairment  Outcome: Progressing     Problem: Tissue Perfusion Altered  Goal: Improved Tissue Perfusion  Outcome: Progressing

## 2019-01-29 LAB — COMPREHENSIVE METABOLIC PANEL
ALBUMIN: 3.9 g/dL (ref 3.5–5.0)
ALKALINE PHOSPHATASE: 104 U/L (ref 38–126)
ALT (SGPT): 18 U/L (ref <50)
AST (SGOT): 19 U/L (ref 19–55)
BILIRUBIN TOTAL: 0.4 mg/dL (ref 0.0–1.2)
BLOOD UREA NITROGEN: 15 mg/dL (ref 7–21)
BUN / CREAT RATIO: 27
CALCIUM: 9.3 mg/dL (ref 8.5–10.2)
CHLORIDE: 100 mmol/L (ref 98–107)
CO2: 22 mmol/L (ref 22.0–30.0)
CREATININE: 0.56 mg/dL — ABNORMAL LOW (ref 0.70–1.30)
EGFR CKD-EPI AA MALE: >90 mL/min/{1.73_m2} (ref >=60)
EGFR CKD-EPI NON-AA MALE: >90 mL/min/{1.73_m2} (ref >=60)
GLUCOSE RANDOM: 142 mg/dL (ref 70–179)
POTASSIUM: 4.2 mmol/L (ref 3.5–5.0)
PROTEIN TOTAL: 7.3 g/dL (ref 6.5–8.3)
SODIUM: 137 mmol/L (ref 135–145)

## 2019-01-29 LAB — MEAN CORPUSCULAR VOLUME: Lab: 86.6

## 2019-01-29 LAB — CBC
HEMATOCRIT: 37.5 % — ABNORMAL LOW (ref 41.0–53.0)
MEAN CORPUSCULAR HEMOGLOBIN CONC: 32.6 g/dL (ref 31.0–37.0)
MEAN CORPUSCULAR HEMOGLOBIN: 28.2 pg (ref 26.0–34.0)
MEAN CORPUSCULAR VOLUME: 86.6 fL (ref 80.0–100.0)
MEAN PLATELET VOLUME: 7.7 fL (ref 7.0–10.0)
PLATELET COUNT: 324 10*9/L (ref 150–440)
RED CELL DISTRIBUTION WIDTH: 17 % — ABNORMAL HIGH (ref 12.0–15.0)
WBC ADJUSTED: 5.5 10*9/L (ref 4.5–11.0)

## 2019-01-29 LAB — ANION GAP: Anion gap 3:SCnc:Pt:Ser/Plas:Qn:: 15

## 2019-01-29 NOTE — Unmapped (Signed)
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   - SLP for cognitive and swallow function.  - Neuropsych for higher level cognitive evaluation and coping.  - RT for community re-integration, education, and leisure support services.  - P&O for assistive devices PRN.  - Pharmacy consult for patient and family education on medication management.   - Nutrition consult for diet information/teaching.   - To be discussed in weekly Interdisciplinary Team Conference.  ??  ??  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 Workup significant for MRI showing increased T2 signal in the spinal cord from the level of T10 to the conus medullaris primarily involving the gray matter tracts with focal T2 hyperintensity in the dorsal columns at T10.   ?? Pain Control  ? Tylenol for mild, Tramadol for mod/severe  ? Neuropathic pain: Gabapentin 100mg  TID   ?? Neurogenic Bladder  ? I/O cath q6H (changed 10/7)   ? Maintain cath volumes <500 cc   ?? Neurogenic Bowel: UMN Bowel  ? Admission KUB with moderate stool burden: s/p cleanout with Mag Citrate   ? 3:2:1 (Colace, Senna, Dulcolax suppository in evening + dig stim) up to [facility]   ? Miralax qd at lunch   ?? Spasticity  ? No complaints of spasms at this time.  ?? Equipment/Support  ? PRAFO boots to bilateral feet while in bed to prevent ankle contractures  ?? DVT Prophylaxis  ? DVT ppx with Lovenox x 8 weeks (11/27)   ?? Skin  ? Turn patient Q2H to prevent pressure injuries  ? Maintain nursing skin integrity protocol with daily skin checks  ?? Autonomic Dysreflexia  ? Pt likely not at risk for AD given level  ?? Prognostication/Counseling  ? ASIA Exam - Pending  ? Education regarding patient's diagnosis, anticipated recovery, reintegration to the community, and fertility to be completed    ??  Klebsiella UTI: Patient febrile after admission. Urine culture growing Klebsiella that is ampicillin and TMP-SMX resistant. Given persistent fevers, ID was consulted and recommended blood cx and CXR.  Lungs clear on CXR 10/6.  Blood cultures show no growth at 72 hours on 10/9.   - ID consulted, appreciate assistance  - S/p Ceftriaxone (10/3 - 10/6)  - Levofloxacin 750mg  po qd (10/7 - ) - plan for 10 day course     CNS Toxoplasmosis: Gradually worsening LE weakness/numbness??progressing to lower extremity paralysis 8/22,. MRI spine notable for multiple spinal cord lesions (increased T2??signal from T10??to conus medullaris). MRI brain with??single enhancing??brain lesion to the right??corona radiata. Per radiology,??spinal cord findings most consistent with toxoplasmosis, brain lesion less consistent (not rim enhancing). LP 8/22 notable for elevated cells, lymphocytes, and protein, toxoplasmosis and EBV positive, negative cytology. Sulfadiazine, pyrimethamine, and leucovorin started 8/24 for toxoplasmosis.??Repeat LP 8/25,??again??negative for malignant cells.??CMV+ in the blood but  not CSF.??He received??ganciclovir 8/22-8/25.????Was initially switched to Bactrim (10/5-10/6) for maintenance notes per ID recs, but switched to sulfadiazine/pyrimethamine (10/7 - ) due to concerns from pharmacy.  - S/p induction therapy with Sulfadiazine,  pyrimethamine, and leucovorin for 6 weeks (8/24 - 10/5)  - Sulfadiazine/Pyrimethamine/Leuovorin (maintenance dose) (10/7 - )  ??  HIV/AIDS: Last CD4 = 231/14%. Diagnosed in 2007 and was treatment naive on admission (had previously declined ART due to religious reasons). Started Tivacay/Descovy 8/26.??  - Plan for switch to [facility] on discharge.  - Dr. Dolores Frame has initiated process to coordinate outpatient follow-up and drug assistance enrollment.  - Will need bactrim prophylaxis for PJP once therapy for Toxo completed (hold for now)  - Continue nystatin swishes for oral thrush  ????????????????????  Dry gangrene L hand (improving) ??  Etiology remains unclear, likely multifactorial. Bilateral UE arterial PVLs without any HD-significant findings and blood cultures obtained in the ED were negative. Has had extensive work up including multispecialty consultations including rheumatology, vascular surgery, hematology. Likely early atherosclerotic disease and recommended baby aspirin and agree with starting a statin (see ASCVD risk, below).   - Outpatient follow-up in hand clinic   - bandage changes daily   ??  Tinea pedis??and onychomycosis: Trichophyton confirmed on biopsy.   - Griseofulvin 375mg  BID x 6 months total (9/16 - )  - Continue econazole cream to bilateral feet BID    Nausea EKG 10/7 shows normal sinus rhythm, no QT prolongation  - Ondansetron prn  ??  ASCVD Risk: Pooled Cohort 10-yr risk is 3%, however, HIV is an independent risk factor for CVD which is not included in the risk score. Pt has atherosclerotic disease in his lower extremities and dyslipidemia.   - Atorvastatin 20mg  daily  - Aspirin 81mg  daily   ??  COVID-19 infection (resolved)  - Asymptomatic. Completed 5 days remdesivir.??  - Date of positive SARS-CoV-2 PCR: 12/09/2018 at OSH.   - Patient was 21 days from positive PCR on 9/9 and precautions discontinued. ????  ??  Insomnia  - Melatonin 3mg  qhs  ??  FEN/PPX:  - Diet: Regular   - Fluids: Encourage PO hydration.  - Electrolytes: Monitor and replete PRN.  - DVT ppx: High risk for VTE given limited mobility during hospitalization. Lovenox.  - GI ppx: Not indicated at this time.    SUBJECTIVE:     Interval History: NAEO. Had large, soft incontinent BM at 1600 outside of bowel program.  Had additional large, soft and watery stool during bowel program at 1815.  Yesterday was first day on Miralax, so will CTM today and consider additional changes tomorrow if patient has more episodes of incontinence.  I&O caths 200-479mL with I&O cath q6 hours.  Blood cx show no growth at 72 hours.    OBJECTIVE:     Vital signs (last 24 hours):   Temp:  [36.5 ??C (97.7 ??F)-36.6 ??C (97.9 ??F)] 36.5 ??C (97.7 ??F)  Heart Rate:  [81-94] 81  Resp:  [16-17] 17  BP: (122-125)/(74-82) 122/82  MAP (mmHg):  [89-94] 94  SpO2:  [100 %] 100 %    Intake/Output:  I/O last 3 completed shifts:  In: 0   Out: 1731 [Urine:1730; Stool:1]    Physical Exam:    GEN: NAD, awake, alert  HEENT: Normcephalic, sclera anicteric   Cardio: No cyanosis or clubbing  Resp: Normal work of breathing, no retractions  Abd: non distended  Neuro: EOMI      Medications:    Scheduled   ???  aspirin chewable tablet 81 mg Daily   ??? atorvastatin (LIPITOR) tablet 20 mg Daily   ??? bisacodyL (DULCOLAX) suppository 10 mg QPM   ??? docusate sodium (COLACE) capsule 100 mg TID   ??? dolutegravir (TIVICAY) TABLET Tab 50 mg daily   ??? econazole nitrate (SPECTAZOLE) 1 % cream BID   ??? emtricitabine-tenofovir alafen (Descovy) 200-25 mg tablet 1 tablet Daily   ??? enoxaparin (LOVENOX) syringe 40 mg Q24H SCH   ??? gabapentin (NEURONTIN) capsule 100 mg TID   ??? griseofulvin (GRIS-PEG) tablet 375 mg Daily   ??? leucovorin (WELLCOVORIN) tablet 10 mg Daily   ??? levoFLOXacin (LEVAQUIN) tablet 750 mg Q24H SCH   ??? magnesium oxide (MAG-OX) tablet 400 mg Daily   ??? melatonin tablet 3 mg QPM   ??? nystatin (MYCOSTATIN) oral suspension 4x Daily   ??? polyethylene glycol (MIRALAX) packet 17 g Daily with lunch   ??? pyrimethamine (DARAPRIM) oral suspension Daily   ??? senna (SENOKOT) tablet 2 tablet Daily with lunch   ??? sulfaDIAZINE tablet 1,500 mg BID     PRN acetaminophen, 650 mg, Q4H PRN  calcium carbonate, 200 mg elem calcium, TID PRN  guaiFENesin, 100 mg, Q4H PRN  lidocaine 2% gel, 20 mL, Daily PRN  lidocaine 2% gel, 3 mL, Q4H PRN  ondansetron, 4 mg, Q6H PRN  simethicone, 80 mg, TID PRN  traMADoL, 25 mg, Q6H PRN  traMADoL, 50 mg, Q6H PRN        Labs: I have reviewed the labs and studies from the last 24hrs.        This patient is admitted to the Physical Medicine and Rehabilitation - Inpatient - A service, please page [phone] service, please contact the Thereasa Parkin of this note 8am-5pm on weekdays for questions regarding this patient. After hours and on weekends please contact the 1st call reisdent pager       Delaine Lame, MS4           I attest that I have reviewed the student note, and that the components of the history of the present illness, the physical exam, and the assessment and plan documented were performed by me or were performed in my presence by the student where I verified the documentation and performed (or re-performed) the exam and medical decision making.   Sueanne Margarita D.O., PGY-2

## 2019-01-29 NOTE — Unmapped (Signed)
Pt is A & O x 4, denied pain, I & O cath done every 6 hrs, encouraged to start self, pt stated was not ready, teaching and demonstration done. Bowel program done in the evening. Fall and skin care precautions maintained, call bell in reach, monitoring ongoing.    Problem: Rehabilitation (IRF) Plan of Care  Goal: Plan of Care Review  Outcome: Progressing  Goal: Patient-Specific Goal (Individualization)  Outcome: Progressing  Goal: Absence of Hospital-Acquired Illness or Injury  Outcome: Progressing  Goal: Home Safety Plan Established  Outcome: Progressing  Goal: Demonstration of Effective Coping Strategies  Outcome: Progressing  Goal: Community Reintegration Plan Established  Outcome: Progressing     Problem: Fall Injury Risk  Goal: Absence of Fall and Fall-Related Injury  Outcome: Progressing     Problem: Wound  Goal: Optimal Wound Healing  Outcome: Progressing     Problem: Skin Injury Risk Increased  Goal: Skin Health and Integrity  Outcome: Progressing     Problem: Self-Care Deficit  Goal: Improved Ability to Complete Activities of Daily Living  Outcome: Progressing     Problem: Sensory Impairment  Goal: Sensory Impairment Goal: Effective Self-Management of Sensory Impairment  Outcome: Progressing     Problem: Tissue Perfusion Altered  Goal: Improved Tissue Perfusion  Outcome: Progressing

## 2019-01-29 NOTE — Unmapped (Signed)
Alert and oriented x4. Continues to do I/O cath q6. Applied antifungal cream on both feet. Denies pain/discomfort. Sacral foam dressing changed on the buttocks for protection. Dressing on hhis 3rd digit finger on his left will be change before shift ends. Will continue to monitor.     Problem: Rehabilitation (IRF) Plan of Care  Goal: Plan of Care Review  Outcome: Progressing  Goal: Patient-Specific Goal (Individualization)  Outcome: Progressing  Goal: Absence of Hospital-Acquired Illness or Injury  Outcome: Progressing  Goal: Home Safety Plan Established  Outcome: Progressing  Goal: Demonstration of Effective Coping Strategies  Outcome: Progressing  Goal: Community Reintegration Plan Established  Outcome: Progressing     Problem: Fall Injury Risk  Goal: Absence of Fall and Fall-Related Injury  Outcome: Progressing     Problem: Wound  Goal: Optimal Wound Healing  Outcome: Progressing

## 2019-01-30 NOTE — Unmapped (Signed)
A & O x 4 ,I & O cath Q 6 H ,daily bowel program ,denied pain ,skin & fall precautions maintained ,call bell & phone within reach,will continue to monitor .  Problem: Rehabilitation (IRF) Plan of Care  Goal: Plan of Care Review  Outcome: Progressing  Goal: Patient-Specific Goal (Individualization)  Outcome: Progressing  Goal: Absence of Hospital-Acquired Illness or Injury  Outcome: Progressing  Goal: Home Safety Plan Established  Outcome: Progressing  Goal: Demonstration of Effective Coping Strategies  Outcome: Progressing  Goal: Community Reintegration Plan Established  Outcome: Progressing     Problem: Fall Injury Risk  Goal: Absence of Fall and Fall-Related Injury  Outcome: Progressing     Problem: Wound  Goal: Optimal Wound Healing  Outcome: Progressing     Problem: Skin Injury Risk Increased  Goal: Skin Health and Integrity  Outcome: Progressing     Problem: Self-Care Deficit  Goal: Improved Ability to Complete Activities of Daily Living  Outcome: Progressing     Problem: Sensory Impairment  Goal: Sensory Impairment Goal: Effective Self-Management of Sensory Impairment  Outcome: Progressing     Problem: Tissue Perfusion Altered  Goal: Improved Tissue Perfusion  Outcome: Progressing

## 2019-01-30 NOTE — Unmapped (Signed)
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  Patient doing well no complaints.   -Large bowel incontinence prior to suppository.  Bowel regimen yielded large BMs x4.   -Cath volumes appropriate (300???400)      REHAB:   - PT and OT to maximize functional status with mobility and ADLs as well as prevention of joint contracture.   - SLP for cognitive and swallow function.  - Neuropsych for higher level cognitive evaluation and coping.  - RT for community re-integration, education, and leisure support services.  - P&O for assistive devices PRN.  - Pharmacy consult for patient and family education on medication management.   - Nutrition consult for diet information/teaching.   - To be discussed in weekly Interdisciplinary Team Conference.  ??  ??  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 Workup significant for MRI showing increased T2 signal in the spinal cord from the level of T10 to the conus medullaris primarily involving the gray matter tracts with focal T2 hyperintensity in the dorsal columns at T10.   ?? Pain Control  ? Tylenol for mild, Tramadol for mod/severe  ? Neuropathic pain: Gabapentin 100mg  TID   ?? Neurogenic Bladder  ? I/O cath q6H (changed 10/7)   ? Maintain cath volumes <500 cc   ?? Neurogenic Bowel: UMN Bowel  ? Admission KUB with moderate stool burden: s/p cleanout with Mag Citrate   ? 3:2:1 (Colace, Senna, Dulcolax suppository in evening + dig stim) up to [facility]   ? Miralax qd at lunch   ?? Spasticity  ? No complaints of spasms at this time.  ?? Equipment/Support  ? PRAFO boots to bilateral feet while in bed to prevent ankle contractures  ?? DVT Prophylaxis  ? DVT ppx with Lovenox x 8 weeks (11/27)   ?? Skin  ? Turn patient Q2H to prevent pressure injuries  ? Maintain nursing skin integrity protocol with daily skin checks  ?? Autonomic Dysreflexia  ? Pt likely not at risk for AD given level  ?? Prognostication/Counseling  ? ASIA Exam - Pending  ? Education regarding patient's diagnosis, anticipated recovery, reintegration to the community, and fertility to be completed    ??  Klebsiella UTI: Patient febrile after admission. Urine culture growing Klebsiella that is ampicillin and TMP-SMX resistant. Given persistent fevers, ID was consulted and recommended blood cx and CXR.  Lungs clear on CXR 10/6.  Blood cultures show no growth at 72 hours on 10/9.   - ID consulted, appreciate assistance  - S/p Ceftriaxone (10/3 - 10/6)  - Levofloxacin 750mg  po qd (10/7 - ) - plan for 10 day course     CNS Toxoplasmosis: Gradually worsening LE weakness/numbness??progressing to lower extremity paralysis 8/22,. MRI spine notable for multiple spinal cord lesions (increased T2??signal from T10??to conus medullaris). MRI brain with??single enhancing??brain lesion to the right??corona radiata. Per radiology,??spinal cord findings most consistent with toxoplasmosis, brain lesion less consistent (not  rim enhancing). LP 8/22 notable for elevated cells, lymphocytes, and protein, toxoplasmosis and EBV positive, negative cytology. Sulfadiazine, pyrimethamine, and leucovorin started 8/24 for toxoplasmosis.??Repeat LP 8/25,??again??negative for malignant cells.??CMV+ in the blood but not CSF.??He received??ganciclovir 8/22-8/25.????Was initially switched to Bactrim (10/5-10/6) for maintenance notes per ID recs, but switched to sulfadiazine/pyrimethamine (10/7 - ) due to concerns from pharmacy.  - S/p induction therapy with Sulfadiazine,  pyrimethamine, and leucovorin for 6 weeks (8/24 - 10/5)  - Sulfadiazine/Pyrimethamine/Leuovorin (maintenance dose) (10/7 - )  ??  HIV/AIDS: Last CD4 = 231/14%. Diagnosed in 2007 and was treatment naive on admission (had previously declined ART due to religious reasons). Started Tivacay/Descovy 8/26.??  - Plan for switch to [facility] on discharge.  - Dr. Dolores Frame has initiated process to coordinate outpatient follow-up and drug assistance enrollment.  - Will need bactrim prophylaxis for PJP once therapy for Toxo completed (hold for now)  - Continue nystatin swishes for oral thrush  ????????????????????  Dry gangrene L hand (improving) ??  Etiology remains unclear, likely multifactorial. Bilateral UE arterial PVLs without any HD-significant findings and blood cultures obtained in the ED were negative. Has had extensive work up including multispecialty consultations including rheumatology, vascular surgery, hematology. Likely early atherosclerotic disease and recommended baby aspirin and agree with starting a statin (see ASCVD risk, below).   - Outpatient follow-up in hand clinic   - bandage changes daily   ??  Tinea pedis??and onychomycosis: Trichophyton confirmed on biopsy.   - Griseofulvin 375mg  BID x 6 months total (9/16 - )  - Continue econazole cream to bilateral feet BID    Nausea EKG 10/7 shows normal sinus rhythm, no QT prolongation  - Ondansetron prn  ??  ASCVD Risk: Pooled Cohort 10-yr risk is 3%, however, HIV is an independent risk factor for CVD which is not included in the risk score. Pt has atherosclerotic disease in his lower extremities and dyslipidemia.   - Atorvastatin 20mg  daily  - Aspirin 81mg  daily   ??  COVID-19 infection (resolved)  - Asymptomatic. Completed 5 days remdesivir.??  - Date of positive SARS-CoV-2 PCR: 12/09/2018 at OSH.   - Patient was 21 days from positive PCR on 9/9 and precautions discontinued. ????  ??  Insomnia  - Melatonin 3mg  qhs  ??  FEN/PPX: - Diet: Regular   - Fluids: Encourage PO hydration.  - Electrolytes: Monitor and replete PRN.  - DVT ppx: High risk for VTE given limited mobility during hospitalization. Lovenox.  - GI ppx: Not indicated at this time.    SUBJECTIVE:     Interval History: Please see interval events above.    OBJECTIVE:     Vital signs (last 24 hours):   Temp:  [36.6 ??C-36.8 ??C] 36.8 ??C  Heart Rate:  [78-102] 78  Resp:  [16-17] 17  BP: (117-122)/(84-88) 117/84  MAP (mmHg):  [95-98] 95  SpO2:  [99 %-100 %] 99 %    Intake/Output:  I/O last 3 completed shifts:  In: -   Out: 2280 [Urine:2280]    Physical Exam:    GEN: NAD, awake, alert  HEENT: Normcephalic, sclera anicteric   Cardio: No cyanosis or clubbing  Resp: Normal work of breathing, no retractions  Abd: non distended  Neuro: EOMI      Medications:    Scheduled   ??? aspirin chewable tablet 81 mg Daily   ??? atorvastatin (LIPITOR) tablet 20 mg Daily   ??? bisacodyL (DULCOLAX) suppository 10 mg QPM   ??? docusate sodium (COLACE) capsule 100 mg  TID   ??? dolutegravir (TIVICAY) TABLET Tab 50 mg daily   ??? econazole nitrate (SPECTAZOLE) 1 % cream BID   ??? emtricitabine-tenofovir alafen (Descovy) 200-25 mg tablet 1 tablet Daily   ??? enoxaparin (LOVENOX) syringe 40 mg Q24H SCH   ??? gabapentin (NEURONTIN) capsule 100 mg TID   ??? griseofulvin (GRIS-PEG) tablet 375 mg Daily   ??? leucovorin (WELLCOVORIN) tablet 10 mg Daily   ??? levoFLOXacin (LEVAQUIN) tablet 750 mg Q24H SCH   ??? magnesium oxide (MAG-OX) tablet 400 mg Daily   ??? melatonin tablet 3 mg QPM   ??? nystatin (MYCOSTATIN) oral suspension 4x Daily   ??? polyethylene glycol (MIRALAX) packet 17 g Daily with lunch   ??? pyrimethamine (DARAPRIM) oral suspension Daily   ??? senna (SENOKOT) tablet 2 tablet Daily with lunch   ??? sulfaDIAZINE tablet 1,500 mg BID     PRN acetaminophen, 650 mg, Q4H PRN  calcium carbonate, 200 mg elem calcium, TID PRN  guaiFENesin, 100 mg, Q4H PRN  lidocaine 2% gel, 20 mL, Daily PRN  lidocaine 2% gel, 3 mL, Q4H PRN  ondansetron, 4 mg, Q6H PRN  simethicone, 80 mg, TID PRN  traMADoL, 25 mg, Q6H PRN  traMADoL, 50 mg, Q6H PRN        Labs: I have reviewed the labs and studies from the last 24hrs.        This patient is admitted to the Physical Medicine and Rehabilitation - Inpatient - A service, please page [phone] service, please contact the Thereasa Parkin of this note 8am-5pm on weekdays for questions regarding this patient. After hours and on weekends please contact the 1st call reisdent pager

## 2019-01-30 NOTE — Unmapped (Signed)
Pt is A & O x 4, denied pain, I & O cath done every 6 hrs, encouraged to self cath, pt attempted but was very nervous  and unable to complete doing the procedure. Bowel program done in the evening. Fall and skin care precautions maintained, call bell in reach, monitoring ongoing.    Problem: Rehabilitation (IRF) Plan of Care  Goal: Plan of Care Review  Outcome: Progressing  Goal: Patient-Specific Goal (Individualization)  Outcome: Progressing  Goal: Absence of Hospital-Acquired Illness or Injury  Outcome: Progressing  Goal: Home Safety Plan Established  Outcome: Progressing  Goal: Demonstration of Effective Coping Strategies  Outcome: Progressing  Goal: Community Reintegration Plan Established  Outcome: Progressing     Problem: Fall Injury Risk  Goal: Absence of Fall and Fall-Related Injury  Outcome: Progressing     Problem: Wound  Goal: Optimal Wound Healing  Outcome: Progressing     Problem: Skin Injury Risk Increased  Goal: Skin Health and Integrity  Outcome: Progressing     Problem: Self-Care Deficit  Goal: Improved Ability to Complete Activities of Daily Living  Outcome: Progressing     Problem: Sensory Impairment  Goal: Sensory Impairment Goal: Effective Self-Management of Sensory Impairment  Outcome: Progressing     Problem: Tissue Perfusion Altered  Goal: Improved Tissue Perfusion  Outcome: Progressing

## 2019-01-31 NOTE — Unmapped (Signed)
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  Problem: Rehabilitation (IRF) Plan of Care  Goal: Plan of Care Review  Outcome: Progressing  Goal: Patient-Specific Goal (Individualization)  Outcome: Progressing  Goal: Absence of Hospital-Acquired Illness or Injury  Outcome: Progressing  Goal: Home Safety Plan Established  Outcome: Progressing  Goal: Demonstration of Effective Coping Strategies  Outcome: Progressing  Goal: Community Reintegration Plan Established  Outcome: Progressing     Problem: Fall Injury Risk  Goal: Absence of Fall and Fall-Related Injury  Outcome: Progressing     Problem: Wound  Goal: Optimal Wound Healing  Outcome: Progressing     Problem: Skin Injury Risk Increased  Goal: Skin Health and Integrity  Outcome: Progressing     Problem: Self-Care Deficit  Goal: Improved Ability to Complete Activities of Daily Living  Outcome: Progressing     Problem: Sensory Impairment  Goal: Sensory Impairment Goal: Effective Self-Management of Sensory Impairment  Outcome: Progressing     Problem: Tissue Perfusion Altered  Goal: Improved Tissue Perfusion  Outcome: Progressing

## 2019-01-31 NOTE — Unmapped (Signed)
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  Patient doing well no complaints.   -Large bowel incontinence prior to suppository.  Bowel regimen yielded large BMs x4.   -Cath volumes appropriate (300???400)    10/11: VSS.     - Large incontinence prior to suppository.  Bowel regimen yielded medium/loose BM. Deferred escalating regimen per primary team.   -Cath volumes appropriate (300???450)        REHAB:   - PT and OT to maximize functional status with mobility and ADLs as well as prevention of joint contracture.   - SLP for cognitive and swallow function.  - Neuropsych for higher level cognitive evaluation and coping.  - RT for community re-integration, education, and leisure support services.  - P&O for assistive devices PRN.  - Pharmacy consult for patient and family education on medication management.   - Nutrition consult for diet information/teaching.   - To be discussed in weekly Interdisciplinary Team Conference.  ??  ??  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 Workup significant for MRI showing increased T2 signal in the spinal cord from the level of T10 to the conus medullaris primarily involving the gray matter tracts with focal T2 hyperintensity in the dorsal columns at T10.   ?? Pain Control  ? Tylenol for mild, Tramadol for mod/severe  ? Neuropathic pain: Gabapentin 100mg  TID   ?? Neurogenic Bladder ? I/O cath q6H (changed 10/7)   ? Maintain cath volumes <500 cc   ?? Neurogenic Bowel: UMN Bowel  ? Admission KUB with moderate stool burden: s/p cleanout with Mag Citrate   ? 3:2:1 (Colace, Senna, Dulcolax suppository in evening + dig stim) up to [facility]   ? Miralax qd at lunch   ?? Spasticity  ? No complaints of spasms at this time.  ?? Equipment/Support  ? PRAFO boots to bilateral feet while in bed to prevent ankle contractures  ?? DVT Prophylaxis  ? DVT ppx with Lovenox x 8 weeks (11/27)   ?? Skin  ? Turn patient Q2H to prevent pressure injuries  ? Maintain nursing skin integrity protocol with daily skin checks  ?? Autonomic Dysreflexia  ? Pt likely not at risk for AD given level  ?? Prognostication/Counseling  ? ASIA Exam - Pending  ? Education regarding patient's diagnosis, anticipated recovery, reintegration to the community, and fertility to be completed    ??  Klebsiella UTI: Patient febrile after admission. Urine culture growing Klebsiella that is ampicillin and TMP-SMX resistant. Given persistent fevers, ID was consulted and recommended blood cx and CXR.  Lungs clear on CXR 10/6.  Blood cultures show no growth at 72 hours on 10/9.   - ID consulted, appreciate assistance  - S/p Ceftriaxone (10/3 - 10/6)  - Levofloxacin 750mg  po qd (10/7 - ) - plan for 10 day course CNS Toxoplasmosis: Gradually worsening LE weakness/numbness??progressing to lower extremity paralysis 8/22,. MRI spine notable for multiple spinal  cord lesions (increased T2??signal from T10??to conus medullaris). MRI brain with??single enhancing??brain lesion to the right??corona radiata. Per radiology,??spinal cord findings most consistent with toxoplasmosis, brain lesion less consistent (not rim enhancing). LP 8/22 notable for elevated cells, lymphocytes, and protein, toxoplasmosis and EBV positive, negative cytology. Sulfadiazine, pyrimethamine, and leucovorin started 8/24 for toxoplasmosis.??Repeat LP 8/25,??again??negative for malignant cells.??CMV+ in the blood but not CSF.??He received??ganciclovir 8/22-8/25.????Was initially switched to Bactrim (10/5-10/6) for maintenance notes per ID recs, but switched to sulfadiazine/pyrimethamine (10/7 - ) due to concerns from pharmacy.  - S/p induction therapy with Sulfadiazine,  pyrimethamine, and leucovorin for 6 weeks (8/24 - 10/5)  - Sulfadiazine/Pyrimethamine/Leuovorin (maintenance dose) (10/7 - )  ??  HIV/AIDS: Last CD4 = 231/14%. Diagnosed in 2007 and was treatment naive on admission (had previously declined ART due to religious reasons). Started Tivacay/Descovy 8/26.??  - Plan for switch to [facility] on discharge.  - Dr. Dolores Frame has initiated process to coordinate outpatient follow-up and drug assistance enrollment.  - Will need bactrim prophylaxis for PJP once therapy for Toxo completed (hold for now)  - Continue nystatin swishes for oral thrush  ????????????????????  Dry gangrene L hand (improving) Etiology remains unclear, likely multifactorial. Bilateral UE arterial PVLs without any HD-significant findings and blood cultures obtained in the ED were negative. Has had extensive work up including multispecialty consultations including rheumatology, vascular surgery, hematology. Likely early atherosclerotic disease and recommended baby aspirin and agree with starting a statin (see ASCVD risk, below).   - Outpatient follow-up in hand clinic   - bandage changes daily   ??  Tinea pedis??and onychomycosis: Trichophyton confirmed on biopsy.   - Griseofulvin 375mg  BID x 6 months total (9/16 - )  - Continue econazole cream to bilateral feet BID    Nausea EKG 10/7 shows normal sinus rhythm, no QT prolongation  - Ondansetron prn  ??  ASCVD Risk: Pooled Cohort 10-yr risk is 3%, however, HIV is an independent risk factor for CVD which is not included in the risk score. Pt has atherosclerotic disease in his lower extremities and dyslipidemia.   - Atorvastatin 20mg  daily  - Aspirin 81mg  daily   ??  COVID-19 infection (resolved)  - Asymptomatic. Completed 5 days remdesivir.??  - Date of positive SARS-CoV-2 PCR: 12/09/2018 at OSH.   - Patient was 21 days from positive PCR on 9/9 and precautions discontinued. ????  ??  Insomnia  - Melatonin 3mg  qhs  ??  FEN/PPX:  - Diet: Regular   - Fluids: Encourage PO hydration.  - Electrolytes: Monitor and replete PRN.  - DVT ppx: High risk for VTE given limited mobility during hospitalization. Lovenox.  - GI ppx: Not indicated at this time.    SUBJECTIVE:     Interval History: Please see interval events above.    OBJECTIVE:     Vital signs (last 24 hours):   Temp:  [36.9 ??C] 36.9 ??C  Heart Rate:  [85-105] 85  Resp:  [16-18] 18  BP: (116-120)/(82-86) 116/82  MAP (mmHg):  [93-97] 93  SpO2:  [98 %-99 %] 98 %    Intake/Output:  I/O last 3 completed shifts:  In: -   Out: 2081 [Urine:2080; Stool:1]    Physical Exam:    GEN: NAD, awake, alert  HEENT: Normcephalic, sclera anicteric Cardio: No cyanosis or clubbing  Resp: Normal work of breathing, no retractions  Abd: non distended  Neuro: EOMI      Medications:    Scheduled   ??? aspirin chewable tablet 81 mg Daily   ???  atorvastatin (LIPITOR) tablet 20 mg Daily   ??? bisacodyL (DULCOLAX) suppository 10 mg QPM   ??? docusate sodium (COLACE) capsule 100 mg TID   ??? dolutegravir (TIVICAY) TABLET Tab 50 mg daily   ??? econazole nitrate (SPECTAZOLE) 1 % cream BID   ??? emtricitabine-tenofovir alafen (Descovy) 200-25 mg tablet 1 tablet Daily   ??? enoxaparin (LOVENOX) syringe 40 mg Q24H SCH   ??? gabapentin (NEURONTIN) capsule 100 mg TID   ??? griseofulvin (GRIS-PEG) tablet 375 mg Daily   ??? leucovorin (WELLCOVORIN) tablet 10 mg Daily   ??? levoFLOXacin (LEVAQUIN) tablet 750 mg Q24H SCH   ??? magnesium oxide (MAG-OX) tablet 400 mg Daily   ??? melatonin tablet 3 mg QPM   ??? nystatin (MYCOSTATIN) oral suspension 4x Daily   ??? polyethylene glycol (MIRALAX) packet 17 g Daily with lunch   ??? pyrimethamine (DARAPRIM) oral suspension Daily   ??? senna (SENOKOT) tablet 2 tablet Daily with lunch   ??? sulfaDIAZINE tablet 1,500 mg BID     PRN   ???  acetaminophen, 650 mg, Q4H PRN    ???  calcium carbonate, 200 mg elem calcium, TID PRN    ???  guaiFENesin, 100 mg, Q4H PRN    ???  lidocaine 2% gel, 20 mL, Daily PRN    ???  lidocaine 2% gel, 3 mL, Q4H PRN    ???  ondansetron, 4 mg, Q6H PRN    ???  simethicone, 80 mg, TID PRN    ???  traMADoL, 25 mg, Q6H PRN    ???  traMADoL, 50 mg, Q6H PRN        Labs: I have reviewed the labs and studies from the last 24hrs.        This patient is admitted to the Physical Medicine and Rehabilitation - Inpatient - A service, please page [phone] service, please contact the Thereasa Parkin of this note 8am-5pm on weekdays for questions regarding this patient. After hours and on weekends please contact the 1st call reisdent pager

## 2019-01-31 NOTE — Unmapped (Signed)
Pt is A & O x 4, denied pain, I & O cath done every 6 hrs, encouraged to self cath. Bowel program done in the evening. Fall and skin care precautions maintained, call bell in reach, monitoring ongoing.    Problem: Rehabilitation (IRF) Plan of Care  Goal: Plan of Care Review  Outcome: Progressing  Goal: Patient-Specific Goal (Individualization)  Outcome: Progressing  Goal: Absence of Hospital-Acquired Illness or Injury  Outcome: Progressing  Goal: Home Safety Plan Established  Outcome: Progressing  Goal: Demonstration of Effective Coping Strategies  Outcome: Progressing  Goal: Community Reintegration Plan Established  Outcome: Progressing     Problem: Fall Injury Risk  Goal: Absence of Fall and Fall-Related Injury  Outcome: Progressing     Problem: Wound  Goal: Optimal Wound Healing  Outcome: Progressing     Problem: Skin Injury Risk Increased  Goal: Skin Health and Integrity  Outcome: Progressing     Problem: Self-Care Deficit  Goal: Improved Ability to Complete Activities of Daily Living  Outcome: Progressing     Problem: Sensory Impairment  Goal: Sensory Impairment Goal: Effective Self-Management of Sensory Impairment  Outcome: Progressing     Problem: Tissue Perfusion Altered  Goal: Improved Tissue Perfusion  Outcome: Progressing

## 2019-02-01 LAB — COMPREHENSIVE METABOLIC PANEL
ALBUMIN: 3.9 g/dL (ref 3.5–5.0)
ALKALINE PHOSPHATASE: 102 U/L (ref 38–126)
ALT (SGPT): 17 U/L (ref <50)
ANION GAP: 13 mmol/L (ref 7–15)
AST (SGOT): 19 U/L (ref 19–55)
BILIRUBIN TOTAL: 0.4 mg/dL (ref 0.0–1.2)
BLOOD UREA NITROGEN: 12 mg/dL (ref 7–21)
BUN / CREAT RATIO: 20
CHLORIDE: 102 mmol/L (ref 98–107)
CO2: 24 mmol/L (ref 22.0–30.0)
CREATININE: 0.6 mg/dL — ABNORMAL LOW (ref 0.70–1.30)
EGFR CKD-EPI NON-AA MALE: >90 mL/min/{1.73_m2} (ref >=60)
GLUCOSE RANDOM: 132 mg/dL (ref 70–179)
PROTEIN TOTAL: 7.2 g/dL (ref 6.5–8.3)
SODIUM: 139 mmol/L (ref 135–145)

## 2019-02-01 LAB — CBC
HEMATOCRIT: 37 % — ABNORMAL LOW (ref 41.0–53.0)
HEMOGLOBIN: 12.6 g/dL — ABNORMAL LOW (ref 13.5–17.5)
MEAN CORPUSCULAR HEMOGLOBIN CONC: 34 g/dL (ref 31.0–37.0)
MEAN CORPUSCULAR HEMOGLOBIN: 29.6 pg (ref 26.0–34.0)
MEAN CORPUSCULAR VOLUME: 87.1 fL (ref 80.0–100.0)
MEAN PLATELET VOLUME: 8.6 fL (ref 7.0–10.0)
PLATELET COUNT: 292 10*9/L (ref 150–440)
RED BLOOD CELL COUNT: 4.25 10*12/L — ABNORMAL LOW (ref 4.50–5.90)
WBC ADJUSTED: 6.2 10*9/L (ref 4.5–11.0)

## 2019-02-01 LAB — WBC ADJUSTED: Leukocytes:NCnc:Pt:Bld:Qn:: 6.2

## 2019-02-01 LAB — ALKALINE PHOSPHATASE: Alkaline phosphatase:CCnc:Pt:Ser/Plas:Qn:: 102

## 2019-02-01 NOTE — Unmapped (Signed)
A & O x 4 ,daily bowel program ,I & O cath Q 6 H ,BLL on off loading boots ,patient declines change of position ,bed low locked ,call bell within reach ,will continue to monitor.  Problem: Rehabilitation (IRF) Plan of Care  Goal: Plan of Care Review  Outcome: Progressing  Goal: Patient-Specific Goal (Individualization)  Outcome: Progressing  Goal: Absence of Hospital-Acquired Illness or Injury  Outcome: Progressing  Goal: Home Safety Plan Established  Outcome: Progressing  Goal: Demonstration of Effective Coping Strategies  Outcome: Progressing  Goal: Community Reintegration Plan Established  Outcome: Progressing     Problem: Fall Injury Risk  Goal: Absence of Fall and Fall-Related Injury  Outcome: Progressing     Problem: Wound  Goal: Optimal Wound Healing  Outcome: Progressing     Problem: Skin Injury Risk Increased  Goal: Skin Health and Integrity  Outcome: Progressing     Problem: Self-Care Deficit  Goal: Improved Ability to Complete Activities of Daily Living  Outcome: Progressing     Problem: Sensory Impairment  Goal: Sensory Impairment Goal: Effective Self-Management of Sensory Impairment  Outcome: Progressing     Problem: Tissue Perfusion Altered  Goal: Improved Tissue Perfusion  Outcome: Progressing

## 2019-02-01 NOTE — Unmapped (Signed)
The patient properly demonstrated how to self-catheterize using sterile technique and with set-up. He refused lunch because he stated that he was not hungry.    Problem: Rehabilitation (IRF) Plan of Care  Goal: Plan of Care Review  Outcome: Progressing  Goal: Patient-Specific Goal (Individualization)  Outcome: Progressing  Goal: Absence of Hospital-Acquired Illness or Injury  Outcome: Progressing  Goal: Home Safety Plan Established  Outcome: Progressing  Goal: Demonstration of Effective Coping Strategies  Outcome: Progressing  Goal: Community Reintegration Plan Established  Outcome: Progressing     Problem: Fall Injury Risk  Goal: Absence of Fall and Fall-Related Injury  Outcome: Progressing     Problem: Wound  Goal: Optimal Wound Healing  Outcome: Progressing     Problem: Skin Injury Risk Increased  Goal: Skin Health and Integrity  Outcome: Progressing     Problem: Self-Care Deficit  Goal: Improved Ability to Complete Activities of Daily Living  Outcome: Progressing     Problem: Sensory Impairment  Goal: Sensory Impairment Goal: Effective Self-Management of Sensory Impairment  Outcome: Progressing     Problem: Tissue Perfusion Altered  Goal: Improved Tissue Perfusion  Outcome: Progressing     Problem: Adjustment to Injury (Spinal Cord Injury)  Goal: Optimal Coping with Life-Changing Injury  Outcome: Progressing     Problem: Bowel Elimination Impaired (Spinal Cord Injury)  Goal: Effective Bowel Elimination  Outcome: Progressing     Problem: Embolism (Spinal Cord Injury)  Goal: Absence of Embolism Signs/Symptoms  Outcome: Progressing     Problem: Extended Neurologic Impairment (Spinal Cord Injury)  Goal: Absence of Extended Neurologic Injury  Outcome: Progressing     Problem: Functional Ability Impaired (Spinal Cord Injury)  Goal: Optimal Functional Ability  Outcome: Progressing     Problem: Hemodynamic Instability (Spinal Cord Injury)  Goal: Hemodynamic Stability Maintained  Outcome: Progressing Problem: Neurogenic Shock (Spinal Cord Injury)  Goal: Effective Tissue Perfusion  Outcome: Progressing     Problem: Nutrition Impaired (Spinal Cord Injury)  Goal: Optimal Nutrition Intake  Outcome: Progressing     Problem: Orthostatic Hypotension (Spinal Cord Injury)  Goal: Absence of Postural Hypotension  Outcome: Progressing     Problem: Pain (Spinal Cord Injury)  Goal: Acceptable Pain Control  Outcome: Progressing     Problem: Respiratory Compromise (Spinal Cord Injury)  Goal: Effective Oxygenation and Ventilation  Outcome: Progressing     Problem: Sensorimotor Impairment (Spinal Cord Injury)  Goal: Optimal Sensorimotor Function  Outcome: Progressing     Problem: Skin Injury (Spinal Cord Injury)  Goal: Skin Health and Integrity  Outcome: Progressing     Problem: Thermoregulation Alteration (Spinal Cord Injury)  Goal: Body Temperature Remains in Desired Range  Outcome: Progressing     Problem: Urinary Elimination Impaired (Spinal Cord Injury)  Goal: Effective Urinary Elimination  Outcome: Progressing     Problem: Mobility Impairment  Goal: Optimal Mobility  Outcome: Progressing     Problem: Infection  Goal: Infection Symptom Resolution  Outcome: Progressing     Problem: Venous Thromboembolism  Goal: VTE (Venous Thromboembolism) Symptom Resolution  Outcome: Progressing     Problem: Adjustment to Surgery (Extremity Amputation)  Goal: Optimal Coping with Amputation  Outcome: Progressing     Problem: Bleeding (Extremity Amputation)  Goal: Absence of Bleeding  Outcome: Progressing     Problem: Functional Ability Impaired (Extremity Amputation)  Goal: Optimal Functional Ability  Outcome: Progressing     Problem: Pain (Extremity Amputation)  Goal: Acceptable Pain Control  Outcome: Progressing     Problem: Residual Limb Care (Extremity Amputation)  Goal: Optimal Residual Limb Healing  Outcome: Progressing

## 2019-02-01 NOTE — Unmapped (Signed)
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   - SLP for cognitive and swallow function.  - Neuropsych for higher level cognitive evaluation and coping.  - RT for community re-integration, education, and leisure support services.  - P&O for assistive devices PRN.  - Pharmacy consult for patient and family education on medication management.   - Nutrition consult for diet information/teaching.   - To be discussed in weekly Interdisciplinary Team Conference.  ??  ??  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 Workup significant for MRI showing increased T2 signal in the spinal cord from the level of T10 to the conus medullaris primarily involving the gray matter tracts with focal T2 hyperintensity in the dorsal columns at T10.   ?? Pain Control  ? Tylenol for mild, Tramadol for mod/severe  ? Neuropathic pain: Gabapentin 100mg  TID   ?? Neurogenic Bladder  ? I/O cath q6H (changed 10/7)   ? Maintain cath volumes <500 cc   ?? Neurogenic Bowel: UMN Bowel  ? Admission KUB with moderate stool burden: s/p cleanout with Mag Citrate   ? 3:2*:1 (Colace, Senna, Dulcolax suppository in evening + dig stim) up to [facility]   ? Miralax daily   ?? Spasticity  ? No complaints of spasms at this time.  ?? Equipment/Support ? PRAFO boots to bilateral feet while in bed to prevent ankle contractures  ?? DVT Prophylaxis  ? DVT ppx with Lovenox x 8 weeks (11/27)   ?? Skin  ? Turn patient Q2H to prevent pressure injuries  ? Maintain nursing skin integrity protocol with daily skin checks  ?? Autonomic Dysreflexia  ? Pt likely not at risk for AD given level  ?? Prognostication/Counseling  ? ASIA Exam - Pending  ? Education regarding patient's diagnosis, anticipated recovery, reintegration to the community, and fertility to be completed    ??  Klebsiella UTI: Patient febrile after admission. Urine culture growing Klebsiella that is ampicillin and TMP-SMX resistant. Given persistent fevers, ID was consulted and recommended blood cx and CXR.  Lungs clear on CXR 10/6. Blood cultures NGTD.   - S/p Ceftriaxone (10/3 - 10/6)  - Levofloxacin 750mg  po qd (10/7 - 10/16 ) x10 dayas     CNS Toxoplasmosis: Gradually worsening LE weakness/numbness??progressing to lower extremity paralysis 8/22,. MRI spine notable for multiple spinal cord lesions (increased T2??signal from T10??to conus medullaris). MRI brain with??single enhancing??brain lesion to the right??corona radiata. Per radiology,??spinal cord findings most consistent with toxoplasmosis, brain lesion less consistent (not rim enhancing). LP 8/22 notable for elevated cells, lymphocytes, and protein, toxoplasmosis and EBV positive, negative cytology. Sulfadiazine, pyrimethamine, and leucovorin started 8/24 for toxoplasmosis.??Repeat LP 8/25,??again??negative for malignant cells.??CMV+ in the blood but not CSF.??He received??ganciclovir 8/22-8/25.????Was initially switched to Bactrim (10/5-10/6) for maintenance notes per ID recs, but switched to sulfadiazine/pyrimethamine (10/7 - )  due to concerns from pharmacy.  - S/p induction therapy with Sulfadiazine,  pyrimethamine, and leucovorin for 6 weeks (8/24 - 10/5)  - Sulfadiazine/Pyrimethamine/Leuovorin (maintenance dose) (10/7 - ) HIV/AIDS: Last CD4 = 231/14%. Diagnosed in 2007 and was treatment naive on admission (had previously declined ART due to religious reasons). Started Tivacay/Descovy 8/26.??  - Plan for switch to [facility] on discharge.  - Dr. Dolores Frame has initiated process to coordinate outpatient follow-up and drug assistance enrollment.  - Will need bactrim prophylaxis for PJP once therapy for Toxo completed (hold for now)  - Continue nystatin swishes for oral thrush  ????????????????????  Dry gangrene L hand (improving) ??  Etiology remains unclear, likely multifactorial. Bilateral UE arterial PVLs without any HD-significant findings and blood cultures obtained in the ED were negative. Has had extensive work up including multispecialty consultations including rheumatology, vascular surgery, hematology. Likely early atherosclerotic disease and recommended baby aspirin and agree with starting a statin (see ASCVD risk, below).   - Outpatient follow-up in hand clinic   - bandage changes daily   ??  Tinea pedis??and onychomycosis: Trichophyton confirmed on biopsy.   - Griseofulvin 375mg  BID x 6 months total (9/16 - )  - Continue econazole cream to bilateral feet BID    Nausea EKG 10/7 shows normal sinus rhythm, no QT prolongation  - Ondansetron prn  ??  ASCVD Risk: Pooled Cohort 10-yr risk is 3%, however, HIV is an independent risk factor for CVD which is not included in the risk score. Pt has atherosclerotic disease in his lower extremities and dyslipidemia.   - Atorvastatin 20mg  daily  - Aspirin 81mg  daily   ??  COVID-19 infection (resolved)  - Asymptomatic. Completed 5 days remdesivir.??  - Date of positive SARS-CoV-2 PCR: 12/09/2018 at OSH.   - Patient was 21 days from positive PCR on 9/9 and precautions discontinued. ????  ??  Insomnia  - Melatonin 3mg  qhs  ??  FEN/PPX:  - Diet: Regular   - Fluids: Encourage PO hydration.  - Electrolytes: Monitor and replete PRN.  - DVT ppx: High risk for VTE given limited mobility during hospitalization. Lovenox. - GI ppx: Not indicated at this time.    SUBJECTIVE:     Interval History: NAEO. Seen during therapy. Reports some difficulty with I/O caths, will work with nursing to try cath that works. Can consider using a coude. Cath volumes remain appropriate. Reports a testicular lump. Will plan to evaluate after therapies.     OBJECTIVE:     Vital signs (last 24 hours):   Temp:  [36.7 ??C-36.8 ??C] 36.7 ??C  Heart Rate:  [78-97] 78  Resp:  [18] 18  BP: (121-126)/(81-86) 121/81  MAP (mmHg):  [93-98] 93  SpO2:  [99 %-100 %] 100 %    Intake/Output:  I/O last 3 completed shifts:  In: -   Out: 2375 [Urine:2375]    Physical Exam:    GEN: NAD, awake, alert  HEENT: Normcephalic, sclera anicteric   Cardio: No cyanosis or clubbing  Resp: Normal work of breathing, no retractions  Abd: non distended  Neuro: EOMI      Labs: I have reviewed the labs and studies from the last 24hrs.        This patient is admitted to the Physical Medicine and Rehabilitation - Inpatient - A service, please page [phone] service, please contact the Thereasa Parkin of this note 8am-5pm on weekdays for questions regarding this patient. After hours and on weekends please contact the 1st call reisdent pager

## 2019-02-02 LAB — MAGNESIUM
MAGNESIUM: 1.7 mg/dL (ref 1.6–2.2)
Magnesium:MCnc:Pt:Ser/Plas:Qn:: 1.7

## 2019-02-02 NOTE — Unmapped (Signed)
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   - SLP for cognitive and swallow function.  - Neuropsych for higher level cognitive evaluation and coping.  - RT for community re-integration, education, and leisure support services.  - P&O for assistive devices PRN.  - Pharmacy consult for patient and family education on medication management.   - Nutrition consult for diet information/teaching.   - To be discussed in weekly Interdisciplinary Team Conference.  ??  ??  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 Workup significant for MRI showing increased T2 signal in the spinal cord from the level of T10 to the conus medullaris primarily involving the gray matter tracts with focal T2 hyperintensity in the dorsal columns at T10.   ?? Pain Control  ? Tylenol for mild, Tramadol for mod/severe  ? Neuropathic pain: Gabapentin 100mg  TID   ?? Neurogenic Bladder  ? I/O cath q6H (changed 10/7)   ? Maintain cath volumes <500 cc   ?? Neurogenic Bowel: UMN Bowel  ? Admission KUB with moderate stool burden: s/p cleanout with Mag Citrate   ? 3:2:1 (Colace, Senna, Dulcolax suppository in evening + dig stim) up to [facility]   ? Miralax daily d/c'd 10/13   ?? Spasticity  ? No complaints of spasms at this time.  ?? Equipment/Support ? PRAFO boots to bilateral feet while in bed to prevent ankle contractures  ?? DVT Prophylaxis  ? DVT ppx with Lovenox x 8 weeks (11/27)   ?? Skin  ? Turn patient Q2H to prevent pressure injuries  ? Maintain nursing skin integrity protocol with daily skin checks  ?? Prognostication/Counseling  ? ASIA Exam to be considered   ? Education regarding patient's diagnosis, anticipated recovery, reintegration to the community, and fertility to be completed  ??  Klebsiella UTI: Patient febrile after admission. Urine culture growing Klebsiella that is ampicillin and TMP-SMX resistant. Given persistent fevers, ID was consulted and recommended blood cx and CXR.  Lungs clear on CXR 10/6. Blood cultures NGTD.   - S/p Ceftriaxone (10/3 - 10/6)  - Levofloxacin 750mg  po qd (10/7 - 10/16) x10 days per ID     CNS Toxoplasmosis: Gradually worsening LE weakness/numbness??progressing to lower extremity paralysis 8/22,. MRI spine notable for multiple spinal cord lesions (increased T2??signal from T10??to conus medullaris). MRI brain with??single enhancing??brain lesion to the right??corona radiata. Per radiology,??spinal cord findings most consistent with toxoplasmosis, brain lesion less consistent (not rim enhancing). LP 8/22 notable for elevated cells, lymphocytes, and protein, toxoplasmosis and EBV positive, negative cytology. Sulfadiazine, pyrimethamine, and leucovorin started 8/24 for toxoplasmosis.??Repeat LP 8/25,??again??negative for malignant cells.??CMV+ in the blood but not CSF.??He received??ganciclovir 8/22-8/25.????Was initially switched to Bactrim (10/5-10/6) for maintenance notes per ID recs, but switched to sulfadiazine/pyrimethamine (10/7 - ) due to concerns from pharmacy.  - S/p induction therapy  with Sulfadiazine,  pyrimethamine, and leucovorin for 6 weeks (8/24 - 10/5)  - Sulfadiazine/Pyrimethamine/Leuovorin (maintenance dose) (10/7 - ) HIV/AIDS: Last CD4 = 231/14%. Diagnosed in 2007 and was treatment naive on admission (had previously declined ART due to religious reasons). Started Tivacay/Descovy 8/26.??  - Plan for switch to [facility] on discharge.  - Dr. Dolores Frame has initiated process to coordinate outpatient follow-up and drug assistance enrollment.  - Will need bactrim prophylaxis for PJP once therapy for Toxo completed (hold for now)  - Nystatin d/c'd 10/13   ????????????????????  Dry gangrene L hand (improving) ??  Etiology remains unclear, likely multifactorial. Bilateral UE arterial PVLs without any HD-significant findings and blood cultures obtained in the ED were negative. Has had extensive work up including multispecialty consultations including rheumatology, vascular surgery, hematology. Likely early atherosclerotic disease and recommended baby aspirin and agree with starting a statin (see ASCVD risk, below).   - Outpatient follow-up in hand clinic   - bandage changes daily   ??  Tinea pedis??and onychomycosis: Trichophyton confirmed on biopsy.   - Griseofulvin 375mg  BID x 6 months total (9/16 - )  - Continue econazole cream to bilateral feet BID  ??  ASCVD Risk: Pooled Cohort 10-yr risk is 3%, however, HIV is an independent risk factor for CVD which is not included in the risk score. Pt has atherosclerotic disease in his lower extremities and dyslipidemia.   - Atorvastatin 20mg  daily  - Aspirin 81mg  daily   ??  COVID-19 infection (resolved)  - Asymptomatic. Completed 5 days remdesivir.??  - Date of positive SARS-CoV-2 PCR: 12/09/2018 at OSH.   - Patient was 21 days from positive PCR on 9/9 and precautions discontinued. ????  ??  Insomnia  - Melatonin 3mg  qhs  ??  FEN/PPX:  - Diet: Regular   - Fluids: Encourage PO hydration.  - Electrolytes: Monitor and replete PRN.  - DVT ppx: High risk for VTE given limited mobility during hospitalization. Lovenox.  - GI ppx: Not indicated at this time.    SUBJECTIVE:     Interval History: NAEO. Seen again during therapy. OBJECTIVE:     Vital signs (last 24 hours):   Temp:  [36.6 ??C-36.7 ??C] 36.7 ??C  Heart Rate:  [73-81] 81  Resp:  [18] 18  BP: (110-117)/(68-76) 117/76  MAP (mmHg):  [81-89] 89  SpO2:  [97 %-100 %] 100 %    Intake/Output:  I/O last 3 completed shifts:  In: 480 [P.O.:480]  Out: 1800 [Urine:1800]    Physical Exam:    GEN: NAD, awake, alert  HEENT: Normcephalic, sclera anicteric   Cardio: No cyanosis or clubbing  Resp: Normal work of breathing, no retractions  Abd: non distended  Neuro: EOMI      Labs: I have reviewed the labs and studies from the last 24hrs.        This patient is admitted to the Physical Medicine and Rehabilitation - Inpatient - A service, please page [phone] service, please contact the Thereasa Parkin of this note 8am-5pm on weekdays for questions regarding this patient. After hours and on weekends please contact the 1st call reisdent pager

## 2019-02-02 NOTE — Unmapped (Signed)
A & O x 4 ,for I & O cath Q 6 H ,for daily bowel program ,not in acute pain ,call bell & phone within reach ,will continue to monitor.  Problem: Rehabilitation (IRF) Plan of Care  Goal: Plan of Care Review  Outcome: Progressing  Goal: Patient-Specific Goal (Individualization)  Outcome: Progressing  Goal: Absence of Hospital-Acquired Illness or Injury  Outcome: Progressing  Goal: Home Safety Plan Established  Outcome: Progressing  Goal: Demonstration of Effective Coping Strategies  Outcome: Progressing  Goal: Community Reintegration Plan Established  Outcome: Progressing     Problem: Fall Injury Risk  Goal: Absence of Fall and Fall-Related Injury  Outcome: Progressing     Problem: Wound  Goal: Optimal Wound Healing  Outcome: Progressing     Problem: Skin Injury Risk Increased  Goal: Skin Health and Integrity  Outcome: Progressing     Problem: Self-Care Deficit  Goal: Improved Ability to Complete Activities of Daily Living  Outcome: Progressing     Problem: Sensory Impairment  Goal: Sensory Impairment Goal: Effective Self-Management of Sensory Impairment  Outcome: Progressing     Problem: Tissue Perfusion Altered  Goal: Improved Tissue Perfusion  Outcome: Progressing     Problem: Adjustment to Injury (Spinal Cord Injury)  Goal: Optimal Coping with Life-Changing Injury  Outcome: Progressing     Problem: Bowel Elimination Impaired (Spinal Cord Injury)  Goal: Effective Bowel Elimination  Outcome: Progressing     Problem: Embolism (Spinal Cord Injury)  Goal: Absence of Embolism Signs/Symptoms  Outcome: Progressing     Problem: Extended Neurologic Impairment (Spinal Cord Injury)  Goal: Absence of Extended Neurologic Injury  Outcome: Progressing     Problem: Functional Ability Impaired (Spinal Cord Injury)  Goal: Optimal Functional Ability  Outcome: Progressing     Problem: Hemodynamic Instability (Spinal Cord Injury)  Goal: Hemodynamic Stability Maintained  Outcome: Progressing Problem: Neurogenic Shock (Spinal Cord Injury)  Goal: Effective Tissue Perfusion  Outcome: Progressing     Problem: Nutrition Impaired (Spinal Cord Injury)  Goal: Optimal Nutrition Intake  Outcome: Progressing     Problem: Orthostatic Hypotension (Spinal Cord Injury)  Goal: Absence of Postural Hypotension  Outcome: Progressing     Problem: Pain (Spinal Cord Injury)  Goal: Acceptable Pain Control  Outcome: Progressing     Problem: Respiratory Compromise (Spinal Cord Injury)  Goal: Effective Oxygenation and Ventilation  Outcome: Progressing     Problem: Sensorimotor Impairment (Spinal Cord Injury)  Goal: Optimal Sensorimotor Function  Outcome: Progressing     Problem: Skin Injury (Spinal Cord Injury)  Goal: Skin Health and Integrity  Outcome: Progressing     Problem: Thermoregulation Alteration (Spinal Cord Injury)  Goal: Body Temperature Remains in Desired Range  Outcome: Progressing     Problem: Urinary Elimination Impaired (Spinal Cord Injury)  Goal: Effective Urinary Elimination  Outcome: Progressing     Problem: Mobility Impairment  Goal: Optimal Mobility  Outcome: Progressing     Problem: Infection  Goal: Infection Symptom Resolution  Outcome: Progressing     Problem: Venous Thromboembolism  Goal: VTE (Venous Thromboembolism) Symptom Resolution  Outcome: Progressing     Problem: Adjustment to Surgery (Extremity Amputation)  Goal: Optimal Coping with Amputation  Outcome: Progressing     Problem: Bleeding (Extremity Amputation)  Goal: Absence of Bleeding  Outcome: Progressing     Problem: Functional Ability Impaired (Extremity Amputation)  Goal: Optimal Functional Ability  Outcome: Progressing     Problem: Pain (Extremity Amputation)  Goal: Acceptable Pain Control  Outcome: Progressing     Problem:  Residual Limb Care (Extremity Amputation)  Goal: Optimal Residual Limb Healing  Outcome: Progressing

## 2019-02-02 NOTE — Unmapped (Addendum)
The patient self-catheterized using sterile technique. He successfully demonstrated how to perform his own bowel program while sitting on the bedside commode with cueing and set-up. He expressed hesitancy throughout the process. He also expressed frustration with having bowel incontinence during the day. The peri-anal skin appeared broken down after his bowel program so zinc oxide paste was applied. Mepilex was applied to his sacrum for protection, and the patient was encouraged to turn Q2 hrs. He refused lunch.    Problem: Rehabilitation (IRF) Plan of Care  Goal: Plan of Care Review  Outcome: Progressing  Goal: Patient-Specific Goal (Individualization)  Outcome: Progressing  Goal: Absence of Hospital-Acquired Illness or Injury  Outcome: Progressing  Goal: Home Safety Plan Established  Outcome: Progressing  Goal: Demonstration of Effective Coping Strategies  Outcome: Progressing  Goal: Community Reintegration Plan Established  Outcome: Progressing     Problem: Fall Injury Risk  Goal: Absence of Fall and Fall-Related Injury  Outcome: Progressing     Problem: Wound  Goal: Optimal Wound Healing  Outcome: Progressing     Problem: Skin Injury Risk Increased  Goal: Skin Health and Integrity  Outcome: Progressing     Problem: Self-Care Deficit  Goal: Improved Ability to Complete Activities of Daily Living  Outcome: Progressing     Problem: Sensory Impairment  Goal: Sensory Impairment Goal: Effective Self-Management of Sensory Impairment  Outcome: Progressing     Problem: Tissue Perfusion Altered  Goal: Improved Tissue Perfusion  Outcome: Progressing     Problem: Adjustment to Injury (Spinal Cord Injury)  Goal: Optimal Coping with Life-Changing Injury  Outcome: Progressing     Problem: Bowel Elimination Impaired (Spinal Cord Injury)  Goal: Effective Bowel Elimination  Outcome: Progressing     Problem: Embolism (Spinal Cord Injury)  Goal: Absence of Embolism Signs/Symptoms  Outcome: Progressing Problem: Extended Neurologic Impairment (Spinal Cord Injury)  Goal: Absence of Extended Neurologic Injury  Outcome: Progressing     Problem: Functional Ability Impaired (Spinal Cord Injury)  Goal: Optimal Functional Ability  Outcome: Progressing     Problem: Hemodynamic Instability (Spinal Cord Injury)  Goal: Hemodynamic Stability Maintained  Outcome: Progressing     Problem: Neurogenic Shock (Spinal Cord Injury)  Goal: Effective Tissue Perfusion  Outcome: Progressing     Problem: Nutrition Impaired (Spinal Cord Injury)  Goal: Optimal Nutrition Intake  Outcome: Progressing     Problem: Orthostatic Hypotension (Spinal Cord Injury)  Goal: Absence of Postural Hypotension  Outcome: Progressing     Problem: Pain (Spinal Cord Injury)  Goal: Acceptable Pain Control  Outcome: Progressing     Problem: Respiratory Compromise (Spinal Cord Injury)  Goal: Effective Oxygenation and Ventilation  Outcome: Progressing     Problem: Sensorimotor Impairment (Spinal Cord Injury)  Goal: Optimal Sensorimotor Function  Outcome: Progressing     Problem: Skin Injury (Spinal Cord Injury)  Goal: Skin Health and Integrity  Outcome: Progressing     Problem: Thermoregulation Alteration (Spinal Cord Injury)  Goal: Body Temperature Remains in Desired Range  Outcome: Progressing     Problem: Urinary Elimination Impaired (Spinal Cord Injury)  Goal: Effective Urinary Elimination  Outcome: Progressing     Problem: Mobility Impairment  Goal: Optimal Mobility  Outcome: Progressing     Problem: Infection  Goal: Infection Symptom Resolution  Outcome: Progressing     Problem: Venous Thromboembolism  Goal: VTE (Venous Thromboembolism) Symptom Resolution  Outcome: Progressing     Problem: Adjustment to Surgery (Extremity Amputation)  Goal: Optimal Coping with Amputation  Outcome: Progressing     Problem:  Bleeding (Extremity Amputation)  Goal: Absence of Bleeding  Outcome: Progressing     Problem: Functional Ability Impaired (Extremity Amputation) Goal: Optimal Functional Ability  Outcome: Progressing     Problem: Pain (Extremity Amputation)  Goal: Acceptable Pain Control  Outcome: Progressing     Problem: Residual Limb Care (Extremity Amputation)  Goal: Optimal Residual Limb Healing  Outcome: Progressing

## 2019-02-03 NOTE — Unmapped (Signed)
Pt is A&O: x3, no c/o pain, breathing regular & unlabored, chest expanded symmetrically, pt is on regular diet, meals: set up, pt is on I&O cath q6hrs, pt is on bowel program, dressing for L finger/L foot/R foot: c/d/I, pt is on falls precautions, bed is in low position, call bell is within reach, will continue monitoring pt's conditions      Problem: Rehabilitation (IRF) Plan of Care  Goal: Plan of Care Review  Outcome: Progressing  Goal: Patient-Specific Goal (Individualization)  Outcome: Progressing  Goal: Absence of Hospital-Acquired Illness or Injury  Outcome: Progressing  Goal: Home Safety Plan Established  Outcome: Progressing  Goal: Demonstration of Effective Coping Strategies  Outcome: Progressing  Goal: Community Reintegration Plan Established  Outcome: Progressing     Problem: Fall Injury Risk  Goal: Absence of Fall and Fall-Related Injury  Outcome: Progressing     Problem: Wound  Goal: Optimal Wound Healing  Outcome: Progressing     Problem: Skin Injury Risk Increased  Goal: Skin Health and Integrity  Outcome: Progressing     Problem: Self-Care Deficit  Goal: Improved Ability to Complete Activities of Daily Living  Outcome: Progressing     Problem: Sensory Impairment  Goal: Sensory Impairment Goal: Effective Self-Management of Sensory Impairment  Outcome: Progressing     Problem: Tissue Perfusion Altered  Goal: Improved Tissue Perfusion  Outcome: Progressing     Problem: Adjustment to Injury (Spinal Cord Injury)  Goal: Optimal Coping with Life-Changing Injury  Outcome: Progressing     Problem: Bowel Elimination Impaired (Spinal Cord Injury)  Goal: Effective Bowel Elimination  Outcome: Progressing     Problem: Embolism (Spinal Cord Injury)  Goal: Absence of Embolism Signs/Symptoms  Outcome: Progressing     Problem: Extended Neurologic Impairment (Spinal Cord Injury)  Goal: Absence of Extended Neurologic Injury  Outcome: Progressing     Problem: Functional Ability Impaired (Spinal Cord Injury) Goal: Optimal Functional Ability  Outcome: Progressing     Problem: Hemodynamic Instability (Spinal Cord Injury)  Goal: Hemodynamic Stability Maintained  Outcome: Progressing     Problem: Neurogenic Shock (Spinal Cord Injury)  Goal: Effective Tissue Perfusion  Outcome: Progressing     Problem: Nutrition Impaired (Spinal Cord Injury)  Goal: Optimal Nutrition Intake  Outcome: Progressing     Problem: Orthostatic Hypotension (Spinal Cord Injury)  Goal: Absence of Postural Hypotension  Outcome: Progressing     Problem: Pain (Spinal Cord Injury)  Goal: Acceptable Pain Control  Outcome: Progressing     Problem: Respiratory Compromise (Spinal Cord Injury)  Goal: Effective Oxygenation and Ventilation  Outcome: Progressing     Problem: Sensorimotor Impairment (Spinal Cord Injury)  Goal: Optimal Sensorimotor Function  Outcome: Progressing     Problem: Skin Injury (Spinal Cord Injury)  Goal: Skin Health and Integrity  Outcome: Progressing     Problem: Thermoregulation Alteration (Spinal Cord Injury)  Goal: Body Temperature Remains in Desired Range  Outcome: Progressing     Problem: Urinary Elimination Impaired (Spinal Cord Injury)  Goal: Effective Urinary Elimination  Outcome: Progressing     Problem: Mobility Impairment  Goal: Optimal Mobility  Outcome: Progressing     Problem: Infection  Goal: Infection Symptom Resolution  Outcome: Progressing     Problem: Venous Thromboembolism  Goal: VTE (Venous Thromboembolism) Symptom Resolution  Outcome: Progressing     Problem: Adjustment to Surgery (Extremity Amputation)  Goal: Optimal Coping with Amputation  Outcome: Progressing     Problem: Bleeding (Extremity Amputation)  Goal: Absence of Bleeding  Outcome: Progressing  Problem: Functional Ability Impaired (Extremity Amputation)  Goal: Optimal Functional Ability  Outcome: Progressing     Problem: Pain (Extremity Amputation)  Goal: Acceptable Pain Control  Outcome: Progressing Problem: Residual Limb Care (Extremity Amputation)  Goal: Optimal Residual Limb Healing  Outcome: Progressing

## 2019-02-03 NOTE — Unmapped (Signed)
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   - SLP for cognitive and swallow function.  - Neuropsych for higher level cognitive evaluation and coping.  - RT for community re-integration, education, and leisure support services.  - P&O for assistive devices PRN.  - Pharmacy consult for patient and family education on medication management.   - Nutrition consult for diet information/teaching.   - To be discussed in weekly Interdisciplinary Team Conference.  ??  ??  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 Workup significant for MRI showing increased T2 signal in the spinal cord from the level of T10 to the conus medullaris primarily involving the gray matter tracts with focal T2 hyperintensity in the dorsal columns at T10.   ?? Pain Control  ? Tylenol for mild, Tramadol for mod/severe  ? Neuropathic pain: Gabapentin 100mg  TID   ?? Neurogenic Bladder  ? I/O cath q6H (changed 10/7)   ? Maintain cath volumes <500 cc   ?? Neurogenic Bowel: UMN Bowel  ? Admission KUB with moderate stool burden: s/p cleanout with Mag Citrate   ? 3:2:1 (Colace, Senna, Dulcolax suppository in evening + dig stim) up to [facility]   ? Miralax daily d/c'd 10/13   ?? Spasticity  ? No complaints of spasms at this time.  ?? Equipment/Support ? PRAFO boots to bilateral feet while in bed to prevent ankle contractures  ?? DVT Prophylaxis  ? DVT ppx with Lovenox x 8 weeks (8 weeks = 11/27)   ?? Skin  ? Turn patient Q2H to prevent pressure injuries  ? Maintain nursing skin integrity protocol with daily skin checks  ?? Prognostication/Counseling  ? ASIA Exam to be considered   ? Education regarding patient's diagnosis, anticipated recovery, reintegration to the community, and fertility to be completed  ??  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 MRI spine notable for multiple spinal cord lesions (increased T2??signal from T10??to conus medullaris). MRI brain with??single enhancing??brain lesion to the right??corona radiata. Per radiology,??spinal cord findings most consistent with toxoplasmosis, brain lesion less consistent (not rim enhancing). LP 8/22 notable for elevated cells, lymphocytes, and protein, toxoplasmosis and EBV positive, negative cytology. Sulfadiazine, pyrimethamine, and leucovorin started 8/24 for toxoplasmosis.??Repeat LP 8/25,??again??negative for malignant cells.??CMV+ in the blood but not CSF.??He received??ganciclovir 8/22-8/25.????Was initially switched to Bactrim (10/5-10/6) for maintenance notes per ID recs, but switched to sulfadiazine/pyrimethamine (10/7 - ) due to concerns from pharmacy.  - S/p induction therapy with Sulfadiazine,  pyrimethamine, and leucovorin for 6 weeks (8/24 - 10/5)  - Sulfadiazine/Pyrimethamine/Leuovorin (maintenance dose) (10/7 - )    Klebsiella UTI: Patient febrile after admission. Urine culture growing Klebsiella that is ampicillin and TMP-SMX resistant. Given persistent fevers, ID was consulted and recommended blood cx and CXR.  Lungs clear on CXR 10/6. Blood cultures  NGTD.   - S/p Ceftriaxone (10/3 - 10/6)  - Levofloxacin 750mg  po qd (10/7 - 10/16) x10 days per ID Scrotal Pain: patient endorsed right testicular/scrotal pain on 10/13. No focal tenderness, erythema, or rashes noticed on examination. Scrotal U/S wnl.  - Patient reported improvement 10/14  - will touch base with uro   ??  HIV/AIDS: Last CD4 = 231/14%. Diagnosed in 2007 and was treatment naive on admission (had previously declined ART due to religious reasons). Started Tivacay/Descovy 8/26.??  - Plan for switch to [facility] on discharge.  - Dr. Dolores Frame has initiated process to coordinate outpatient follow-up and drug assistance enrollment.  - Will need bactrim prophylaxis for PJP once therapy for Toxo completed (hold for now)  - Nystatin d/c'd 10/13   ????????????????????  Dry gangrene L hand (improving) ??  Etiology remains unclear, likely multifactorial. Bilateral UE arterial PVLs without any HD-significant findings and blood cultures obtained in the ED were negative. Has had extensive work up including multispecialty consultations including rheumatology, vascular surgery, hematology. Likely early atherosclerotic disease and recommended baby aspirin and agree with starting a statin (see ASCVD risk, below).   - Outpatient follow-up in hand clinic   - bandage changes daily   ??  Tinea pedis??and onychomycosis: Trichophyton confirmed on biopsy.   - Griseofulvin 375mg  BID x 6 months total (9/16 - )  - Continue econazole cream to bilateral feet BID  ??  ASCVD Risk: Pooled Cohort 10-yr risk is 3%, however, HIV is an independent risk factor for CVD which is not included in the risk score. Pt has atherosclerotic disease in his lower extremities and dyslipidemia.   - Atorvastatin 20mg  daily  - Aspirin 81mg  daily   ??  COVID-19 infection (resolved)  - Asymptomatic. Completed 5 days remdesivir.??  - Date of positive SARS-CoV-2 PCR: 12/09/2018 at OSH.   - Patient was 21 days from positive PCR on 9/9 and precautions discontinued. ????  ??  Insomnia  - Melatonin 3mg  qhs  ??  FEN/PPX:  - Diet: Regular   - Fluids: Encourage PO hydration. - Electrolytes: Monitor and replete PRN.  - DVT ppx: High risk for VTE given limited mobility during hospitalization. Lovenox.  - GI ppx: Not indicated at this time.    SUBJECTIVE:     Interval History: NAEO. Having issues with incontinence outside of bowel program, Miralax was recently discontinued, so will continue to monitor. Reports scrotal pain has somewhat improved this morning. Will touch base with urology. Also asking about return to work options. Will provide the patient with a work note per patient request.     OBJECTIVE:     Vital signs (last 24 hours):   Temp:  [36.6 ??C] 36.6 ??C  Heart Rate:  [82-87] 82  Resp:  [18] 18  BP: (120-129)/(85-89) 120/85  MAP (mmHg):  [97-101] 97  SpO2:  [100 %] 100 %    Intake/Output:  I/O last 3 completed shifts:  In: 0   Out: 1695 [Urine:1695]    Physical Exam:    GEN: NAD, awake, alert  HEENT: Normcephalic, sclera anicteric   Cardio: No cyanosis or clubbing  Resp: Normal work of breathing, no retractions  Abd: non distended  Neuro: EOMI      Labs: I have reviewed the labs and studies from the last 24hrs.        This patient is admitted to the Physical Medicine and Rehabilitation - Inpatient - A service, please page [phone] service, please contact the author of this note 8am-5pm  on weekdays for questions regarding this patient. After hours and on weekends please contact the 1st call reisdent pager

## 2019-02-03 NOTE — Unmapped (Signed)
Patient still admitted to [facility] receiving Tivicay and Descovy and the [facility] sent to be held until discharge on 01/12/2019 is still being held. I will reschedule clinical assessment and refill coordination to to 03/04/2019 based on anticipated discharge of 02/18/2019.    Corliss Skains. [place], [place].D.  Specialty Pharmacist  [facility]  [phone] option 4

## 2019-02-04 LAB — CBC
HEMATOCRIT: 38.5 % — ABNORMAL LOW (ref 41.0–53.0)
HEMOGLOBIN: 12.7 g/dL — ABNORMAL LOW (ref 13.5–17.5)
MEAN CORPUSCULAR HEMOGLOBIN CONC: 33 g/dL (ref 31.0–37.0)
MEAN CORPUSCULAR HEMOGLOBIN: 28.5 pg (ref 26.0–34.0)
MEAN CORPUSCULAR VOLUME: 86.4 fL (ref 80.0–100.0)
MEAN PLATELET VOLUME: 8.1 fL (ref 7.0–10.0)
PLATELET COUNT: 294 10*9/L (ref 150–440)
RED BLOOD CELL COUNT: 4.45 10*12/L — ABNORMAL LOW (ref 4.50–5.90)
RED CELL DISTRIBUTION WIDTH: 17.1 % — ABNORMAL HIGH (ref 12.0–15.0)
WBC ADJUSTED: 5 10*9/L (ref 4.5–11.0)

## 2019-02-04 LAB — COMPREHENSIVE METABOLIC PANEL
ALBUMIN: 3.8 g/dL (ref 3.5–5.0)
ALT (SGPT): 20 U/L (ref <50)
ANION GAP: 10 mmol/L (ref 7–15)
AST (SGOT): 27 U/L (ref 19–55)
BILIRUBIN TOTAL: 0.5 mg/dL (ref 0.0–1.2)
BLOOD UREA NITROGEN: 10 mg/dL (ref 7–21)
BUN / CREAT RATIO: 15
CALCIUM: 9.5 mg/dL (ref 8.5–10.2)
CHLORIDE: 99 mmol/L (ref 98–107)
CREATININE: 0.67 mg/dL — ABNORMAL LOW (ref 0.70–1.30)
EGFR CKD-EPI AA MALE: >90 mL/min/{1.73_m2} (ref >=60)
EGFR CKD-EPI NON-AA MALE: >90 mL/min/{1.73_m2} (ref >=60)
GLUCOSE RANDOM: 103 mg/dL (ref 70–179)
POTASSIUM: 4.3 mmol/L (ref 3.5–5.0)
PROTEIN TOTAL: 7.4 g/dL (ref 6.5–8.3)
SODIUM: 135 mmol/L (ref 135–145)

## 2019-02-04 LAB — PROTEIN TOTAL: Protein:MCnc:Pt:Ser/Plas:Qn:: 7.4

## 2019-02-04 LAB — MAGNESIUM: Magnesium:MCnc:Pt:Ser/Plas:Qn:: 1.7

## 2019-02-04 LAB — HEMATOCRIT: Hematocrit:VFr:Pt:Bld:Qn:: 38.5 — ABNORMAL LOW

## 2019-02-04 NOTE — Unmapped (Addendum)
Pt out of bed sitting up in wheel chair after AM care. Alert and oriented x 3.  Denies any pain or discomfort this shift. Teaching continues with self I&O cath. Pt has problems  with set up d/t wound on left hand. Bowel program in the evening with Dulcolax suppository while is lying in a left sided position. Dig stim x 3. Each  For one minute and ten minutes apart . Medium soft stool evacuated from rectum. Tolerated procedure well.

## 2019-02-04 NOTE — Unmapped (Signed)
Pt is A&O: x3, no c/o pain, breathing regular & unlabored, chest expanded symmetrically, pt is on regular diet, meals: set up, pt is on I&O cath q6hrs, pt is on bowel program, dressing for L finger/L foot/R foot: c/d/I, pt is on falls precautions, bed is in low position, call bell is within reach, will continue monitoring pt's conditions      Problem: Rehabilitation (IRF) Plan of Care  Goal: Plan of Care Review  Outcome: Progressing  Goal: Patient-Specific Goal (Individualization)  Outcome: Progressing  Goal: Absence of Hospital-Acquired Illness or Injury  Outcome: Progressing  Goal: Home Safety Plan Established  Outcome: Progressing  Goal: Demonstration of Effective Coping Strategies  Outcome: Progressing  Goal: Community Reintegration Plan Established  Outcome: Progressing     Problem: Fall Injury Risk  Goal: Absence of Fall and Fall-Related Injury  Outcome: Progressing     Problem: Wound  Goal: Optimal Wound Healing  Outcome: Progressing     Problem: Skin Injury Risk Increased  Goal: Skin Health and Integrity  Outcome: Progressing     Problem: Self-Care Deficit  Goal: Improved Ability to Complete Activities of Daily Living  Outcome: Progressing     Problem: Sensory Impairment  Goal: Sensory Impairment Goal: Effective Self-Management of Sensory Impairment  Outcome: Progressing     Problem: Tissue Perfusion Altered  Goal: Improved Tissue Perfusion  Outcome: Progressing     Problem: Adjustment to Injury (Spinal Cord Injury)  Goal: Optimal Coping with Life-Changing Injury  Outcome: Progressing     Problem: Bowel Elimination Impaired (Spinal Cord Injury)  Goal: Effective Bowel Elimination  Outcome: Progressing     Problem: Embolism (Spinal Cord Injury)  Goal: Absence of Embolism Signs/Symptoms  Outcome: Progressing     Problem: Extended Neurologic Impairment (Spinal Cord Injury)  Goal: Absence of Extended Neurologic Injury  Outcome: Progressing     Problem: Functional Ability Impaired (Spinal Cord Injury) Goal: Optimal Functional Ability  Outcome: Progressing     Problem: Hemodynamic Instability (Spinal Cord Injury)  Goal: Hemodynamic Stability Maintained  Outcome: Progressing     Problem: Neurogenic Shock (Spinal Cord Injury)  Goal: Effective Tissue Perfusion  Outcome: Progressing     Problem: Nutrition Impaired (Spinal Cord Injury)  Goal: Optimal Nutrition Intake  Outcome: Progressing     Problem: Orthostatic Hypotension (Spinal Cord Injury)  Goal: Absence of Postural Hypotension  Outcome: Progressing     Problem: Pain (Spinal Cord Injury)  Goal: Acceptable Pain Control  Outcome: Progressing     Problem: Respiratory Compromise (Spinal Cord Injury)  Goal: Effective Oxygenation and Ventilation  Outcome: Progressing     Problem: Sensorimotor Impairment (Spinal Cord Injury)  Goal: Optimal Sensorimotor Function  Outcome: Progressing     Problem: Skin Injury (Spinal Cord Injury)  Goal: Skin Health and Integrity  Outcome: Progressing     Problem: Thermoregulation Alteration (Spinal Cord Injury)  Goal: Body Temperature Remains in Desired Range  Outcome: Progressing     Problem: Urinary Elimination Impaired (Spinal Cord Injury)  Goal: Effective Urinary Elimination  Outcome: Progressing     Problem: Mobility Impairment  Goal: Optimal Mobility  Outcome: Progressing     Problem: Infection  Goal: Infection Symptom Resolution  Outcome: Progressing     Problem: Venous Thromboembolism  Goal: VTE (Venous Thromboembolism) Symptom Resolution  Outcome: Progressing     Problem: Adjustment to Surgery (Extremity Amputation)  Goal: Optimal Coping with Amputation  Outcome: Progressing     Problem: Bleeding (Extremity Amputation)  Goal: Absence of Bleeding  Outcome: Progressing  Problem: Functional Ability Impaired (Extremity Amputation)  Goal: Optimal Functional Ability  Outcome: Progressing     Problem: Pain (Extremity Amputation)  Goal: Acceptable Pain Control  Outcome: Progressing Problem: Residual Limb Care (Extremity Amputation)  Goal: Optimal Residual Limb Healing  Outcome: Progressing

## 2019-02-04 NOTE — Unmapped (Signed)
Pt out of bed in wheel chair this AM to participate in therapy sessions. Alert and oriented x3. Denies any pain. Reports brief nausea and indigestion. Zofran and Tums given with relief. Continues with I&O cath Q 6 hours. Teaching for self cath continues. Needs assist with setup. Bowel program completed this evening. Dulcolax suppository per rectum with dig stim x 3. Each lasting  for one minute and ten minutes apart. Small formed stool evacuated. Tolerated procedure well

## 2019-02-04 NOTE — Unmapped (Signed)
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   - SLP for cognitive and swallow function.  - Neuropsych for higher level cognitive evaluation and coping.  - RT for community re-integration, education, and leisure support services.  - P&O for assistive devices PRN.  - Pharmacy consult for patient and family education on medication management.   - Nutrition consult for diet information/teaching.   - To be discussed in weekly Interdisciplinary Team Conference.  ??  ??  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 Workup significant for MRI showing increased T2 signal in the spinal cord from the level of T10 to the conus medullaris primarily involving the gray matter tracts with focal T2 hyperintensity in the dorsal columns at T10.   ?? Pain Control  ? Tylenol for mild, Tramadol for mod/severe  ? Neuropathic pain: Gabapentin 100mg  TID   ?? Neurogenic Bladder  ? I/O cath q6H (changed 10/7)   ? Maintain cath volumes <500 cc   ?? Neurogenic Bowel: UMN Bowel  ? Admission KUB with moderate stool burden: s/p cleanout with Mag Citrate   ? 3:2:1 (Colace, Senna, Dulcolax suppository in evening + dig stim) up to [facility]   ? Miralax daily d/c'd 10/13   ?? Spasticity  ? No complaints of spasms at this time.  ?? Equipment/Support ? PRAFO boots to bilateral feet while in bed to prevent ankle contractures  ?? DVT Prophylaxis  ? DVT ppx with Lovenox x 8 weeks (8 weeks = 11/27)   ?? Skin  ? Turn patient Q2H to prevent pressure injuries  ? Maintain nursing skin integrity protocol with daily skin checks  ?? Prognostication/Counseling  ? ASIA Exam to be considered   ? Education regarding patient's diagnosis, anticipated recovery, reintegration to the community, and fertility to be completed  ??  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 MRI spine notable for multiple spinal cord lesions (increased T2??signal from T10??to conus medullaris). MRI brain with??single enhancing??brain lesion to the right??corona radiata. Per radiology,??spinal cord findings most consistent with toxoplasmosis, brain lesion less consistent (not rim enhancing). LP 8/22 notable for elevated cells, lymphocytes, and protein, toxoplasmosis and EBV positive, negative cytology. Sulfadiazine, pyrimethamine, and leucovorin started 8/24 for toxoplasmosis.??Repeat LP 8/25,??again??negative for malignant cells.??CMV+ in the blood but not CSF.??He received??ganciclovir 8/22-8/25.????Was initially switched to Bactrim (10/5-10/6) for maintenance notes per ID recs, but switched to sulfadiazine/pyrimethamine (10/7 - ) due to concerns from pharmacy.  - S/p induction therapy with Sulfadiazine,  pyrimethamine, and leucovorin for 6 weeks (8/24 - 10/5)  - Sulfadiazine/Pyrimethamine/Leuovorin (maintenance dose) (10/7 - )    Klebsiella UTI: Patient febrile after admission. Urine culture growing Klebsiella that is ampicillin and TMP-SMX resistant. Given persistent fevers, ID was consulted and recommended blood cx and CXR.  Lungs clear on CXR 10/6. Blood cultures  NGTD.   - S/p Ceftriaxone (10/3 - 10/6)  - Levofloxacin 750mg  po qd (10/7 - 10/16) x10 days per ID Scrotal Pain: patient endorsed right testicular/scrotal pain on 10/13. No focal tenderness, erythema, or rashes noticed on examination. Scrotal U/S wnl.  - Patient reported improvement 10/14  - will touch base with uro   ??  HIV/AIDS: Last CD4 = 231/14%. Diagnosed in 2007 and was treatment naive on admission (had previously declined ART due to religious reasons). Started Tivacay/Descovy 8/26.??  - Plan for switch to [facility] on discharge.  - Dr. Dolores Frame has initiated process to coordinate outpatient follow-up and drug assistance enrollment.  - Will need bactrim prophylaxis for PJP once therapy for Toxo completed (hold for now)  - Nystatin d/c'd 10/13   ????????????????????  Dry gangrene L hand   Etiology remains unclear, likely multifactorial. Bilateral UE arterial PVLs without any HD-significant findings and blood cultures obtained in the ED were negative. Has had extensive work up including multispecialty consultations including rheumatology, vascular surgery, hematology. Likely early atherosclerotic disease and recommended baby aspirin and agree with starting a statin (see ASCVD risk, below).   - Outpatient follow-up in hand clinic   - bandage changes daily   ??  Tinea pedis??and onychomycosis: Trichophyton confirmed on biopsy.   - Griseofulvin 375mg  BID x 6 months total (9/16 - )  - Continue econazole cream to bilateral feet BID  ??  ASCVD Risk: Pooled Cohort 10-yr risk is 3%, however, HIV is an independent risk factor for CVD which is not included in the risk score. Pt has atherosclerotic disease in his lower extremities and dyslipidemia.   - Atorvastatin 20mg  daily  - Aspirin 81mg  daily   ??  COVID-19 infection (resolved)  - Asymptomatic. Completed 5 days remdesivir.??  - Date of positive SARS-CoV-2 PCR: 12/09/2018 at OSH.   - Patient was 21 days from positive PCR on 9/9 and precautions discontinued. ????  ??  Insomnia  - Melatonin 3mg  qhs  ??  FEN/PPX:  - Diet: Regular   - Fluids: Encourage PO hydration. - Electrolytes: Monitor and replete PRN.  - DVT ppx: High risk for VTE given limited mobility during hospitalization. Lovenox.  - GI ppx: Not indicated at this time.    SUBJECTIVE:     Interval History: NAEO. States groin pain has improved. No issues with bowel/bladder program currently. No complaints,.     OBJECTIVE:     Vital signs (last 24 hours):   Temp:  [36.5 ??C-36.8 ??C] 36.8 ??C  Heart Rate:  [78-95] 78  Resp:  [16-18] 16  BP: (123-126)/(77-86) 126/86  MAP (mmHg):  [92-93] 92  SpO2:  [100 %] 100 %    Intake/Output:  I/O last 3 completed shifts:  In: -   Out: 1120 [Urine:1120]    Physical Exam:    GEN: NAD, awake, alert  HEENT: Normcephalic, sclera anicteric   Cardio: No cyanosis or clubbing  Resp: Normal work of breathing, no retractions  Abd: non distended  Neuro: EOMI      Labs: I have reviewed the labs and studies from the last 24hrs.      This patient is admitted to the Physical Medicine and Rehabilitation - Inpatient - A service, please page [phone] service, please contact the Thereasa Parkin of this note 8am-5pm on weekdays for questions regarding this patient. After hours and on weekends please contact the 1st call reisdent pager

## 2019-02-05 NOTE — Unmapped (Signed)
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   - SLP for cognitive and swallow function.  - Neuropsych for higher level cognitive evaluation and coping.  - RT for community re-integration, education, and leisure support services.  - P&O for assistive devices PRN.  - Pharmacy consult for patient and family education on medication management.   - Nutrition consult for diet information/teaching.   - To be discussed in weekly Interdisciplinary Team Conference.  ??  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 Workup significant for MRI showing increased T2 signal in the spinal cord from the level of T10 to the conus medullaris primarily involving the gray matter tracts with focal T2 hyperintensity in the dorsal columns at T10.   ?? Pain Control  ? Tylenol for mild, Tramadol for mod/severe  ? Neuropathic pain: Gabapentin 100mg  TID   ?? Neurogenic Bladder  ? I/O cath q6H (changed 10/7)   ? Maintain cath volumes <500 cc   ?? Neurogenic Bowel: UMN Bowel  ? Admission KUB with moderate stool burden: s/p cleanout with Mag Citrate   ? 3:2:1 (Colace, Senna, Dulcolax suppository in evening + dig stim) up to [facility]   ? Miralax daily d/c'd 10/13   ?? Spasticity  ? No complaints of spasms at this time.  ?? Equipment/Support ? PRAFO boots to bilateral feet while in bed to prevent ankle contractures  ?? DVT Prophylaxis  ? DVT ppx with Lovenox x 8 weeks (8 weeks = 11/27)   ?? Skin  ? Turn patient Q2H to prevent pressure injuries  ? Maintain nursing skin integrity protocol with daily skin checks  ?? Prognostication/Counseling  ? ASIA Exam to be considered   ? Education regarding patient's diagnosis, anticipated recovery, reintegration to the community, and fertility to be completed  ??  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 MRI spine notable for multiple spinal cord lesions (increased T2??signal from T10??to conus medullaris). MRI brain with??single enhancing??brain lesion to the right??corona radiata. Per radiology,??spinal cord findings most consistent with toxoplasmosis, brain lesion less consistent (not rim enhancing). LP 8/22 notable for elevated cells, lymphocytes, and protein, toxoplasmosis and EBV positive, negative cytology. Sulfadiazine, pyrimethamine, and leucovorin started 8/24 for toxoplasmosis.??Repeat LP 8/25,??again??negative for malignant cells.??CMV+ in the blood but not CSF.??He received??ganciclovir 8/22-8/25.????Was initially switched to Bactrim (10/5-10/6) for maintenance notes per ID recs, but switched to sulfadiazine/pyrimethamine (10/7 - ) due to concerns from pharmacy.  - S/p induction therapy with Sulfadiazine,  pyrimethamine, and leucovorin for 6 weeks (8/24 - 10/5)  - Sulfadiazine/Pyrimethamine/Leuovorin (maintenance dose) (10/7 - )    Klebsiella UTI: resolved. Patient febrile after admission. Urine culture growing Klebsiella that is ampicillin and TMP-SMX resistant. Given persistent fevers, ID was consulted and recommended blood cx and CXR.  Lungs clear on CXR 10/6. Blood cultures NGTD.   -  S/p Ceftriaxone (10/3 - 10/6) & Levofloxacin 750mg  po qd (10/7 - 10/16) x10 days per ID HIV/AIDS: Last CD4 = 231/14%. Diagnosed in 2007 and was treatment naive on admission (had previously declined ART due to religious reasons). Started Tivacay/Descovy 8/26.??  - Plan for switch to [facility] on discharge.  - Dr. Dolores Frame has initiated process to coordinate outpatient follow-up and drug assistance enrollment.  - Will need bactrim prophylaxis for PJP once therapy for Toxo completed (hold for now)  - Nystatin d/c'd 10/13   ????????????????????  Dry gangrene L hand   Etiology remains unclear, likely multifactorial. Bilateral UE arterial PVLs without any HD-significant findings and blood cultures obtained in the ED were negative. Has had extensive work up including multispecialty consultations including rheumatology, vascular surgery, hematology. Likely early atherosclerotic disease and recommended baby aspirin and agree with starting a statin (see ASCVD risk, below).   - Outpatient follow-up in hand clinic   - bandage changes daily   ??  Tinea pedis??and onychomycosis: Trichophyton confirmed on biopsy.   - Griseofulvin 375mg  BID x 6 months total (9/16 - )  - Continue econazole cream to bilateral feet BID  ??  ASCVD Risk: Pooled Cohort 10-yr risk is 3%, however, HIV is an independent risk factor for CVD which is not included in the risk score. Pt has atherosclerotic disease in his lower extremities and dyslipidemia.   - Atorvastatin 20mg  daily  - Aspirin 81mg  daily   ??  COVID-19 infection (resolved)  - Asymptomatic. Completed 5 days remdesivir.??  - Date of positive SARS-CoV-2 PCR: 12/09/2018 at OSH.   - Patient was 21 days from positive PCR on 9/9 and precautions discontinued. ????  ??  Insomnia  - Melatonin 3mg  qhs  ??  FEN/PPX:  - Diet: Regular   - Fluids: Encourage PO hydration.  - Electrolytes: Monitor and replete PRN.  - DVT ppx: High risk for VTE given limited mobility during hospitalization. Lovenox.  - GI ppx: Not indicated at this time.    SUBJECTIVE: Interval History: NAEO. Bowel and bladder program going well. He is practicing on his own. No complaints.     OBJECTIVE:     Vital signs (last 24 hours):   Temp:  [36.4 ??C-36.6 ??C] 36.4 ??C  Heart Rate:  [102-108] 102  Resp:  [14-16] 14  BP: (116-123)/(80-83) 116/80  MAP (mmHg):  [95] 95  SpO2:  [98 %-99 %] 99 %    Intake/Output:  I/O last 3 completed shifts:  In: 620 [P.O.:620]  Out: 1210 [Urine:1210]    Physical Exam:    GEN: NAD, awake, alert  HEENT: Normcephalic, sclera anicteric   Cardio: No cyanosis or clubbing  Resp: Normal work of breathing, no retractions  Abd: non distended  Neuro: EOMI      Labs: I have reviewed the labs and studies from the last 24hrs.      This patient is admitted to the Physical Medicine and Rehabilitation - Inpatient - A service, please page [phone] service, please contact the Thereasa Parkin of this note 8am-5pm on weekdays for questions regarding this patient. After hours and on weekends please contact the 1st call reisdent pager

## 2019-02-05 NOTE — Unmapped (Signed)
Alert and oriented x4.Selfcath with supervision.Bowel program q pm.Turn q 2 hours.Dressing changed per order.Denies any pain.Will continue to monitor.  Problem: Rehabilitation (IRF) Plan of Care  Goal: Plan of Care Review  Outcome: Progressing  Goal: Patient-Specific Goal (Individualization)  Outcome: Progressing  Goal: Absence of Hospital-Acquired Illness or Injury  Outcome: Progressing  Goal: Home Safety Plan Established  Outcome: Progressing  Goal: Demonstration of Effective Coping Strategies  Outcome: Progressing  Goal: Community Reintegration Plan Established  Outcome: Progressing     Problem: Fall Injury Risk  Goal: Absence of Fall and Fall-Related Injury  Outcome: Progressing     Problem: Wound  Goal: Optimal Wound Healing  Outcome: Progressing     Problem: Skin Injury Risk Increased  Goal: Skin Health and Integrity  Outcome: Progressing     Problem: Self-Care Deficit  Goal: Improved Ability to Complete Activities of Daily Living  Outcome: Progressing     Problem: Sensory Impairment  Goal: Sensory Impairment Goal: Effective Self-Management of Sensory Impairment  Outcome: Progressing     Problem: Tissue Perfusion Altered  Goal: Improved Tissue Perfusion  Outcome: Progressing     Problem: Adjustment to Injury (Spinal Cord Injury)  Goal: Optimal Coping with Life-Changing Injury  Outcome: Progressing     Problem: Bowel Elimination Impaired (Spinal Cord Injury)  Goal: Effective Bowel Elimination  Outcome: Progressing     Problem: Embolism (Spinal Cord Injury)  Goal: Absence of Embolism Signs/Symptoms  Outcome: Progressing     Problem: Extended Neurologic Impairment (Spinal Cord Injury)  Goal: Absence of Extended Neurologic Injury  Outcome: Progressing     Problem: Functional Ability Impaired (Spinal Cord Injury)  Goal: Optimal Functional Ability  Outcome: Progressing     Problem: Hemodynamic Instability (Spinal Cord Injury)  Goal: Hemodynamic Stability Maintained  Outcome: Progressing Problem: Neurogenic Shock (Spinal Cord Injury)  Goal: Effective Tissue Perfusion  Outcome: Progressing     Problem: Nutrition Impaired (Spinal Cord Injury)  Goal: Optimal Nutrition Intake  Outcome: Progressing     Problem: Orthostatic Hypotension (Spinal Cord Injury)  Goal: Absence of Postural Hypotension  Outcome: Progressing     Problem: Pain (Spinal Cord Injury)  Goal: Acceptable Pain Control  Outcome: Progressing     Problem: Respiratory Compromise (Spinal Cord Injury)  Goal: Effective Oxygenation and Ventilation  Outcome: Progressing     Problem: Sensorimotor Impairment (Spinal Cord Injury)  Goal: Optimal Sensorimotor Function  Outcome: Progressing     Problem: Skin Injury (Spinal Cord Injury)  Goal: Skin Health and Integrity  Outcome: Progressing     Problem: Thermoregulation Alteration (Spinal Cord Injury)  Goal: Body Temperature Remains in Desired Range  Outcome: Progressing     Problem: Urinary Elimination Impaired (Spinal Cord Injury)  Goal: Effective Urinary Elimination  Outcome: Progressing     Problem: Mobility Impairment  Goal: Optimal Mobility  Outcome: Progressing     Problem: Infection  Goal: Infection Symptom Resolution  Outcome: Progressing     Problem: Venous Thromboembolism  Goal: VTE (Venous Thromboembolism) Symptom Resolution  Outcome: Progressing     Problem: Adjustment to Surgery (Extremity Amputation)  Goal: Optimal Coping with Amputation  Outcome: Progressing     Problem: Bleeding (Extremity Amputation)  Goal: Absence of Bleeding  Outcome: Progressing     Problem: Functional Ability Impaired (Extremity Amputation)  Goal: Optimal Functional Ability  Outcome: Progressing     Problem: Pain (Extremity Amputation)  Goal: Acceptable Pain Control  Outcome: Progressing     Problem: Residual Limb Care (Extremity Amputation)  Goal: Optimal Residual Limb Healing  Outcome: Progressing

## 2019-02-05 NOTE — Unmapped (Signed)
Alert and oriented x4. Last vital signs stable. Staff managing bowel program. Pt performing self-catheterization with supervision. No pain issues so far today. Bed in low position and call bell in reach. Will continue to monitor and follow care plan.    Problem: Rehabilitation (IRF) Plan of Care  Goal: Plan of Care Review  Outcome: Progressing  Goal: Patient-Specific Goal (Individualization)  Outcome: Progressing  Goal: Absence of Hospital-Acquired Illness or Injury  Outcome: Progressing  Goal: Home Safety Plan Established  Outcome: Progressing  Goal: Demonstration of Effective Coping Strategies  Outcome: Progressing  Goal: Community Reintegration Plan Established  Outcome: Progressing     Problem: Fall Injury Risk  Goal: Absence of Fall and Fall-Related Injury  Outcome: Progressing     Problem: Wound  Goal: Optimal Wound Healing  Outcome: Progressing     Problem: Skin Injury Risk Increased  Goal: Skin Health and Integrity  Outcome: Progressing     Problem: Self-Care Deficit  Goal: Improved Ability to Complete Activities of Daily Living  Outcome: Progressing     Problem: Sensory Impairment  Goal: Sensory Impairment Goal: Effective Self-Management of Sensory Impairment  Outcome: Progressing     Problem: Tissue Perfusion Altered  Goal: Improved Tissue Perfusion  Outcome: Progressing     Problem: Adjustment to Injury (Spinal Cord Injury)  Goal: Optimal Coping with Life-Changing Injury  Outcome: Progressing     Problem: Bowel Elimination Impaired (Spinal Cord Injury)  Goal: Effective Bowel Elimination  Outcome: Progressing     Problem: Embolism (Spinal Cord Injury)  Goal: Absence of Embolism Signs/Symptoms  Outcome: Progressing     Problem: Extended Neurologic Impairment (Spinal Cord Injury)  Goal: Absence of Extended Neurologic Injury  Outcome: Progressing     Problem: Functional Ability Impaired (Spinal Cord Injury)  Goal: Optimal Functional Ability  Outcome: Progressing Problem: Hemodynamic Instability (Spinal Cord Injury)  Goal: Hemodynamic Stability Maintained  Outcome: Progressing     Problem: Neurogenic Shock (Spinal Cord Injury)  Goal: Effective Tissue Perfusion  Outcome: Progressing     Problem: Nutrition Impaired (Spinal Cord Injury)  Goal: Optimal Nutrition Intake  Outcome: Progressing     Problem: Orthostatic Hypotension (Spinal Cord Injury)  Goal: Absence of Postural Hypotension  Outcome: Progressing     Problem: Pain (Spinal Cord Injury)  Goal: Acceptable Pain Control  Outcome: Progressing     Problem: Respiratory Compromise (Spinal Cord Injury)  Goal: Effective Oxygenation and Ventilation  Outcome: Progressing     Problem: Sensorimotor Impairment (Spinal Cord Injury)  Goal: Optimal Sensorimotor Function  Outcome: Progressing     Problem: Skin Injury (Spinal Cord Injury)  Goal: Skin Health and Integrity  Outcome: Progressing     Problem: Thermoregulation Alteration (Spinal Cord Injury)  Goal: Body Temperature Remains in Desired Range  Outcome: Progressing     Problem: Urinary Elimination Impaired (Spinal Cord Injury)  Goal: Effective Urinary Elimination  Outcome: Progressing     Problem: Mobility Impairment  Goal: Optimal Mobility  Outcome: Progressing     Problem: Infection  Goal: Infection Symptom Resolution  Outcome: Progressing     Problem: Venous Thromboembolism  Goal: VTE (Venous Thromboembolism) Symptom Resolution  Outcome: Progressing     Problem: Adjustment to Surgery (Extremity Amputation)  Goal: Optimal Coping with Amputation  Outcome: Progressing     Problem: Bleeding (Extremity Amputation)  Goal: Absence of Bleeding  Outcome: Progressing     Problem: Functional Ability Impaired (Extremity Amputation)  Goal: Optimal Functional Ability  Outcome: Progressing     Problem: Pain (Extremity Amputation)  Goal:  Acceptable Pain Control  Outcome: Progressing

## 2019-02-06 NOTE — Unmapped (Signed)
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   - SLP for cognitive and swallow function.  - Neuropsych for higher level cognitive evaluation and coping.  - RT for community re-integration, education, and leisure support services.  - P&O for assistive devices PRN.  - Pharmacy consult for patient and family education on medication management.   - Nutrition consult for diet information/teaching.   - To be discussed in weekly Interdisciplinary Team Conference.  ??  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 Workup significant for MRI showing increased T2 signal in the spinal cord from the level of T10 to the conus medullaris primarily involving the gray matter tracts with focal T2 hyperintensity in the dorsal columns at T10.   ?? Pain Control  ? Tylenol for mild, Tramadol for mod/severe  ? Neuropathic pain: Gabapentin 100mg  TID   ?? Neurogenic Bladder  ? I/O cath q6H (changed 10/7)   ? Maintain cath volumes <500 cc   ? In and Out Catheterization Volume Range in past 24 hours: 300 - 500 mL (4x)  ? Number of bladder incontinence episodes in past 24 hours: 0  ?? Neurogenic Bowel: UMN Bowel  ? Admission KUB with moderate stool burden: s/p cleanout with Mag Citrate   ? 3:2:1 (Colace, Senna, Dulcolax suppository in evening + dig stim) up to [facility] ? Miralax daily d/c'd 10/13   ? Last BM = 02-05-2019 (small to medium hard stool with digital stimulation)  ?? Spasticity  ? No complaints of spasms at this time.  ?? Equipment/Support  ? PRAFO boots to bilateral feet while in bed to prevent ankle contractures  ?? DVT Prophylaxis  ? DVT ppx with Lovenox x 8 weeks (8 weeks = 11/27)   ?? Skin  ? Turn patient Q2H to prevent pressure injuries  ? Maintain nursing skin integrity protocol with daily skin checks  ?? Prognostication/Counseling  ? ASIA Exam to be considered   ? Education regarding patient's diagnosis, anticipated recovery, reintegration to the community, and fertility to be completed  ??  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 MRI spine notable for multiple spinal cord lesions (increased T2??signal from T10??to conus medullaris). MRI brain with??single enhancing??brain lesion to the right??corona radiata. Per radiology,??spinal cord findings most consistent with toxoplasmosis, brain lesion less consistent (not rim enhancing). LP 8/22 notable for elevated cells, lymphocytes, and protein, toxoplasmosis and EBV positive, negative cytology. Sulfadiazine, pyrimethamine, and leucovorin started 8/24 for toxoplasmosis.??Repeat LP 8/25,??again??negative for malignant cells.??CMV+ in the blood but not CSF.??He received??ganciclovir 8/22-8/25.????Was initially switched to Bactrim (10/5-10/6) for maintenance notes per ID recs, but switched to sulfadiazine/pyrimethamine (10/7 - ) due to concerns from pharmacy.  - S/p induction therapy with Sulfadiazine,  pyrimethamine, and leucovorin for 6 weeks (8/24 - 10/5)  - Sulfadiazine/Pyrimethamine/Leuovorin (maintenance dose) (10/7 - ) Klebsiella  UTI: resolved. Patient febrile after admission. Urine culture growing Klebsiella that is ampicillin and TMP-SMX resistant. Given persistent fevers, ID was consulted and recommended blood cx and CXR.  Lungs clear on CXR 10/6. Blood cultures NGTD.   - S/p Ceftriaxone (10/3 - 10/6) & Levofloxacin 750mg  po qd (10/7 - 10/16) x10 days per ID   ??  HIV/AIDS: Last CD4 = 231/14%. Diagnosed in 2007 and was treatment naive on admission (had previously declined ART due to religious reasons). Started Tivacay/Descovy 8/26.??  - Plan for switch to [facility] on discharge.  - Dr. Dolores Frame has initiated process to coordinate outpatient follow-up and drug assistance enrollment.  - Will need bactrim prophylaxis for PJP once therapy for Toxo completed (hold for now)  - Nystatin d/c'd 10/13   ????????????????????  Dry gangrene L hand   Etiology remains unclear, likely multifactorial. Bilateral UE arterial PVLs without any HD-significant findings and blood cultures obtained in the ED were negative. Has had extensive work up including multispecialty consultations including rheumatology, vascular surgery, hematology. Likely early atherosclerotic disease and recommended baby aspirin and agree with starting a statin (see ASCVD risk, below).   - Outpatient follow-up in hand clinic   - bandage changes daily   ??  Tinea pedis??and onychomycosis: Trichophyton confirmed on biopsy.   - Griseofulvin 375mg  BID x 6 months total (9/16 - )  - Continue econazole cream to bilateral feet BID  ??  ASCVD Risk: Pooled Cohort 10-yr risk is 3%, however, HIV is an independent risk factor for CVD which is not included in the risk score. Pt has atherosclerotic disease in his lower extremities and dyslipidemia.   - Atorvastatin 20mg  daily  - Aspirin 81mg  daily   ??  COVID-19 infection (resolved)  - Asymptomatic. Completed 5 days remdesivir.??  - Date of positive SARS-CoV-2 PCR: 12/09/2018 at OSH. - Patient was 21 days from positive PCR on 9/9 and precautions discontinued. ????  ??  Insomnia  - Melatonin 3mg  qhs  ??  FEN/PPX:  - Diet: Regular   - Fluids: Encourage PO hydration.  - Electrolytes: Monitor and replete PRN.  - DVT ppx: High risk for VTE given limited mobility during hospitalization. Lovenox.  - GI ppx: Not indicated at this time.    SUBJECTIVE:     Interval History: A Spanish interpreter was used for this encounter. No acute events overnight. He denies bladder accidents in the past 24 hours. He states that his bowel program was about 1 hour overdue yesterday evening but this was because his nurse was taking care of another patient during a rapid response. He denies bowel accidents. He is performing in and out catheterization independently with supervision. He states that his pain is under control.     OBJECTIVE:     Vital signs (last 24 hours):   Temp:  [36.5 ??C-36.8 ??C] 36.5 ??C  Heart Rate:  [71-90] 71  Resp:  [14-16] 14  BP: (119-121)/(77-81) 121/77  MAP (mmHg):  [91] 91  SpO2:  [99 %] 99 %    Intake/Output:  I/O last 3 completed shifts:  In: 340 [P.O.:340]  Out: 2135 [Urine:2135]    Physical Exam:    GEN: Middle aged male, lying in bed, no acute distress  HEENT: Normcephalic, sclera anicteric   Cardio: Warm and well perfused, no lower extremity edema noted  Resp: Normal work of breathing on room air  Abdomen: non distended  GU: No foley catheter  Neuro: Awake and Alert. Speech is fluent and coherent in Spanish. Comprehension is  intact in [place]. Follows commands appropriately in Spanish. Flaccid BLE.   Psych: Calm and cooperative      Labs: I have reviewed the labs and studies from the last 24hrs.      This patient is admitted to the Physical Medicine and Rehabilitation - Inpatient - A service, please page [phone] service, please contact the Thereasa Parkin of this note 8am-5pm on weekdays for questions regarding this patient. After hours and on weekends please contact the 1st call reisdent pager Johnnette Litter, DO PGY-4  [facility] Physical Medicine and Rehabilitation

## 2019-02-06 NOTE — Unmapped (Signed)
Alert and oriented x4. Staff managing bowel program. Pt performing self-catheterization with cueing from staff. No pain issues. Family visiting. Bed in low position and call bell in reach. Will continue to monitor and follow care plan.     Problem: Rehabilitation (IRF) Plan of Care  Goal: Plan of Care Review  Outcome: Progressing  Goal: Patient-Specific Goal (Individualization)  Outcome: Progressing  Goal: Absence of Hospital-Acquired Illness or Injury  Outcome: Progressing  Goal: Home Safety Plan Established  Outcome: Progressing  Goal: Demonstration of Effective Coping Strategies  Outcome: Progressing  Goal: Community Reintegration Plan Established  Outcome: Progressing     Problem: Fall Injury Risk  Goal: Absence of Fall and Fall-Related Injury  Outcome: Progressing     Problem: Wound  Goal: Optimal Wound Healing  Outcome: Progressing     Problem: Skin Injury Risk Increased  Goal: Skin Health and Integrity  Outcome: Progressing     Problem: Self-Care Deficit  Goal: Improved Ability to Complete Activities of Daily Living  Outcome: Progressing     Problem: Sensory Impairment  Goal: Sensory Impairment Goal: Effective Self-Management of Sensory Impairment  Outcome: Progressing     Problem: Tissue Perfusion Altered  Goal: Improved Tissue Perfusion  Outcome: Progressing     Problem: Adjustment to Injury (Spinal Cord Injury)  Goal: Optimal Coping with Life-Changing Injury  Outcome: Progressing     Problem: Bowel Elimination Impaired (Spinal Cord Injury)  Goal: Effective Bowel Elimination  Outcome: Progressing     Problem: Embolism (Spinal Cord Injury)  Goal: Absence of Embolism Signs/Symptoms  Outcome: Progressing     Problem: Extended Neurologic Impairment (Spinal Cord Injury)  Goal: Absence of Extended Neurologic Injury  Outcome: Progressing     Problem: Functional Ability Impaired (Spinal Cord Injury)  Goal: Optimal Functional Ability  Outcome: Progressing     Problem: Hemodynamic Instability (Spinal Cord Injury) Goal: Hemodynamic Stability Maintained  Outcome: Progressing     Problem: Neurogenic Shock (Spinal Cord Injury)  Goal: Effective Tissue Perfusion  Outcome: Progressing     Problem: Nutrition Impaired (Spinal Cord Injury)  Goal: Optimal Nutrition Intake  Outcome: Progressing     Problem: Orthostatic Hypotension (Spinal Cord Injury)  Goal: Absence of Postural Hypotension  Outcome: Progressing     Problem: Pain (Spinal Cord Injury)  Goal: Acceptable Pain Control  Outcome: Progressing     Problem: Respiratory Compromise (Spinal Cord Injury)  Goal: Effective Oxygenation and Ventilation  Outcome: Progressing     Problem: Sensorimotor Impairment (Spinal Cord Injury)  Goal: Optimal Sensorimotor Function  Outcome: Progressing     Problem: Skin Injury (Spinal Cord Injury)  Goal: Skin Health and Integrity  Outcome: Progressing    Problem: Thermoregulation Alteration (Spinal Cord Injury)  Goal: Body Temperature Remains in Desired Range  Outcome: Progressing     Problem: Urinary Elimination Impaired (Spinal Cord Injury)  Goal: Effective Urinary Elimination  Outcome: Progressing     Problem: Mobility Impairment  Goal: Optimal Mobility  Outcome: Progressing     Problem: Infection  Goal: Infection Symptom Resolution  Outcome: Progressing     Problem: Venous Thromboembolism  Goal: VTE (Venous Thromboembolism) Symptom Resolution  Outcome: Progressing     Problem: Adjustment to Surgery (Extremity Amputation)  Goal: Optimal Coping with Amputation  Outcome: Progressing     Problem: Bleeding (Extremity Amputation)  Goal: Absence of Bleeding  Outcome: Progressing     Problem: Functional Ability Impaired (Extremity Amputation)  Goal: Optimal Functional Ability  Outcome: Progressing     Problem: Pain (Extremity Amputation)  Goal:  Acceptable Pain Control  Outcome: Progressing     Problem: Residual Limb Care (Extremity Amputation)  Goal: Optimal Residual Limb Healing  Outcome: Progressing

## 2019-02-06 NOTE — Unmapped (Signed)
Alert and oriented x4.Selfcath q 6 hours.Bowel program q pm.Turn q 2 hours.Denies any pain.Callbell within reach.Will continue plan of care.  Problem: Rehabilitation (IRF) Plan of Care  Goal: Plan of Care Review  Outcome: Progressing  Goal: Patient-Specific Goal (Individualization)  Outcome: Progressing  Goal: Absence of Hospital-Acquired Illness or Injury  Outcome: Progressing  Goal: Home Safety Plan Established  Outcome: Progressing  Goal: Demonstration of Effective Coping Strategies  Outcome: Progressing  Goal: Community Reintegration Plan Established  Outcome: Progressing     Problem: Fall Injury Risk  Goal: Absence of Fall and Fall-Related Injury  Outcome: Progressing     Problem: Wound  Goal: Optimal Wound Healing  Outcome: Progressing     Problem: Skin Injury Risk Increased  Goal: Skin Health and Integrity  Outcome: Progressing     Problem: Self-Care Deficit  Goal: Improved Ability to Complete Activities of Daily Living  Outcome: Progressing     Problem: Sensory Impairment  Goal: Sensory Impairment Goal: Effective Self-Management of Sensory Impairment  Outcome: Progressing     Problem: Tissue Perfusion Altered  Goal: Improved Tissue Perfusion  Outcome: Progressing     Problem: Adjustment to Injury (Spinal Cord Injury)  Goal: Optimal Coping with Life-Changing Injury  Outcome: Progressing     Problem: Bowel Elimination Impaired (Spinal Cord Injury)  Goal: Effective Bowel Elimination  Outcome: Progressing     Problem: Embolism (Spinal Cord Injury)  Goal: Absence of Embolism Signs/Symptoms  Outcome: Progressing     Problem: Extended Neurologic Impairment (Spinal Cord Injury)  Goal: Absence of Extended Neurologic Injury  Outcome: Progressing     Problem: Functional Ability Impaired (Spinal Cord Injury)  Goal: Optimal Functional Ability  Outcome: Progressing     Problem: Hemodynamic Instability (Spinal Cord Injury)  Goal: Hemodynamic Stability Maintained  Outcome: Progressing Problem: Neurogenic Shock (Spinal Cord Injury)  Goal: Effective Tissue Perfusion  Outcome: Progressing     Problem: Nutrition Impaired (Spinal Cord Injury)  Goal: Optimal Nutrition Intake  Outcome: Progressing     Problem: Orthostatic Hypotension (Spinal Cord Injury)  Goal: Absence of Postural Hypotension  Outcome: Progressing     Problem: Pain (Spinal Cord Injury)  Goal: Acceptable Pain Control  Outcome: Progressing     Problem: Respiratory Compromise (Spinal Cord Injury)  Goal: Effective Oxygenation and Ventilation  Outcome: Progressing     Problem: Sensorimotor Impairment (Spinal Cord Injury)  Goal: Optimal Sensorimotor Function  Outcome: Progressing     Problem: Skin Injury (Spinal Cord Injury)  Goal: Skin Health and Integrity  Outcome: Progressing     Problem: Thermoregulation Alteration (Spinal Cord Injury)  Goal: Body Temperature Remains in Desired Range  Outcome: Progressing     Problem: Urinary Elimination Impaired (Spinal Cord Injury)  Goal: Effective Urinary Elimination  Outcome: Progressing     Problem: Mobility Impairment  Goal: Optimal Mobility  Outcome: Progressing     Problem: Infection  Goal: Infection Symptom Resolution  Outcome: Progressing     Problem: Venous Thromboembolism  Goal: VTE (Venous Thromboembolism) Symptom Resolution  Outcome: Progressing     Problem: Adjustment to Surgery (Extremity Amputation)  Goal: Optimal Coping with Amputation  Outcome: Progressing     Problem: Bleeding (Extremity Amputation)  Goal: Absence of Bleeding  Outcome: Progressing     Problem: Functional Ability Impaired (Extremity Amputation)  Goal: Optimal Functional Ability  Outcome: Progressing     Problem: Pain (Extremity Amputation)  Goal: Acceptable Pain Control  Outcome: Progressing     Problem: Residual Limb Care (Extremity Amputation)  Goal: Optimal Residual Limb  Healing  Outcome: Progressing

## 2019-02-07 NOTE — Unmapped (Signed)
Alert and awake.Selfcath q 6 hours.He was tired and sleepy during the night he was not able to do his cath.Able to turn self.Callbell within reach.Will continue to monitor.  Problem: Rehabilitation (IRF) Plan of Care  Goal: Plan of Care Review  Outcome: Progressing  Goal: Patient-Specific Goal (Individualization)  Outcome: Progressing  Goal: Absence of Hospital-Acquired Illness or Injury  Outcome: Progressing  Goal: Home Safety Plan Established  Outcome: Progressing  Goal: Demonstration of Effective Coping Strategies  Outcome: Progressing  Goal: Community Reintegration Plan Established  Outcome: Progressing     Problem: Fall Injury Risk  Goal: Absence of Fall and Fall-Related Injury  Outcome: Progressing     Problem: Wound  Goal: Optimal Wound Healing  Outcome: Progressing     Problem: Skin Injury Risk Increased  Goal: Skin Health and Integrity  Outcome: Progressing     Problem: Self-Care Deficit  Goal: Improved Ability to Complete Activities of Daily Living  Outcome: Progressing     Problem: Sensory Impairment  Goal: Sensory Impairment Goal: Effective Self-Management of Sensory Impairment  Outcome: Progressing     Problem: Tissue Perfusion Altered  Goal: Improved Tissue Perfusion  Outcome: Progressing     Problem: Adjustment to Injury (Spinal Cord Injury)  Goal: Optimal Coping with Life-Changing Injury  Outcome: Progressing     Problem: Bowel Elimination Impaired (Spinal Cord Injury)  Goal: Effective Bowel Elimination  Outcome: Progressing     Problem: Embolism (Spinal Cord Injury)  Goal: Absence of Embolism Signs/Symptoms  Outcome: Progressing     Problem: Extended Neurologic Impairment (Spinal Cord Injury)  Goal: Absence of Extended Neurologic Injury  Outcome: Progressing     Problem: Functional Ability Impaired (Spinal Cord Injury)  Goal: Optimal Functional Ability  Outcome: Progressing     Problem: Hemodynamic Instability (Spinal Cord Injury)  Goal: Hemodynamic Stability Maintained  Outcome: Progressing Problem: Neurogenic Shock (Spinal Cord Injury)  Goal: Effective Tissue Perfusion  Outcome: Progressing     Problem: Nutrition Impaired (Spinal Cord Injury)  Goal: Optimal Nutrition Intake  Outcome: Progressing     Problem: Orthostatic Hypotension (Spinal Cord Injury)  Goal: Absence of Postural Hypotension  Outcome: Progressing     Problem: Pain (Spinal Cord Injury)  Goal: Acceptable Pain Control  Outcome: Progressing     Problem: Respiratory Compromise (Spinal Cord Injury)  Goal: Effective Oxygenation and Ventilation  Outcome: Progressing     Problem: Sensorimotor Impairment (Spinal Cord Injury)  Goal: Optimal Sensorimotor Function  Outcome: Progressing     Problem: Skin Injury (Spinal Cord Injury)  Goal: Skin Health and Integrity  Outcome: Progressing     Problem: Thermoregulation Alteration (Spinal Cord Injury)  Goal: Body Temperature Remains in Desired Range  Outcome: Progressing     Problem: Urinary Elimination Impaired (Spinal Cord Injury)  Goal: Effective Urinary Elimination  Outcome: Progressing     Problem: Mobility Impairment  Goal: Optimal Mobility  Outcome: Progressing     Problem: Infection  Goal: Infection Symptom Resolution  Outcome: Progressing     Problem: Venous Thromboembolism  Goal: VTE (Venous Thromboembolism) Symptom Resolution  Outcome: Progressing     Problem: Adjustment to Surgery (Extremity Amputation)  Goal: Optimal Coping with Amputation  Outcome: Progressing     Problem: Bleeding (Extremity Amputation)  Goal: Absence of Bleeding  Outcome: Progressing     Problem: Functional Ability Impaired (Extremity Amputation)  Goal: Optimal Functional Ability  Outcome: Progressing     Problem: Pain (Extremity Amputation)  Goal: Acceptable Pain Control  Outcome: Progressing     Problem: Residual Limb  Care (Extremity Amputation)  Goal: Optimal Residual Limb Healing  Outcome: Progressing

## 2019-02-07 NOTE — Unmapped (Signed)
Alert and oriented x4. Incontinent of bowel. Staff managing bowel program. Pt performing self-catheterizations. No pain issues. Pt up in chair. Bed in low position and call bell in reach. Will continue to monitor and follow care plan.     Problem: Rehabilitation (IRF) Plan of Care  Goal: Plan of Care Review  Outcome: Progressing  Goal: Patient-Specific Goal (Individualization)  Outcome: Progressing  Goal: Absence of Hospital-Acquired Illness or Injury  Outcome: Progressing  Goal: Home Safety Plan Established  Outcome: Progressing  Goal: Demonstration of Effective Coping Strategies  Outcome: Progressing  Goal: Community Reintegration Plan Established  Outcome: Progressing     Problem: Fall Injury Risk  Goal: Absence of Fall and Fall-Related Injury  Outcome: Progressing     Problem: Wound  Goal: Optimal Wound Healing  Outcome: Progressing     Problem: Skin Injury Risk Increased  Goal: Skin Health and Integrity  Outcome: Progressing     Problem: Self-Care Deficit  Goal: Improved Ability to Complete Activities of Daily Living  Outcome: Progressing     Problem: Sensory Impairment  Goal: Sensory Impairment Goal: Effective Self-Management of Sensory Impairment  Outcome: Progressing     Problem: Tissue Perfusion Altered  Goal: Improved Tissue Perfusion  Outcome: Progressing     Problem: Adjustment to Injury (Spinal Cord Injury)  Goal: Optimal Coping with Life-Changing Injury  Outcome: Progressing     Problem: Bowel Elimination Impaired (Spinal Cord Injury)  Goal: Effective Bowel Elimination  Outcome: Progressing     Problem: Embolism (Spinal Cord Injury)  Goal: Absence of Embolism Signs/Symptoms  Outcome: Progressing     Problem: Extended Neurologic Impairment (Spinal Cord Injury)  Goal: Absence of Extended Neurologic Injury  Outcome: Progressing     Problem: Functional Ability Impaired (Spinal Cord Injury)  Goal: Optimal Functional Ability  Outcome: Progressing     Problem: Hemodynamic Instability (Spinal Cord Injury) Goal: Hemodynamic Stability Maintained  Outcome: Progressing     Problem: Neurogenic Shock (Spinal Cord Injury)  Goal: Effective Tissue Perfusion  Outcome: Progressing     Problem: Nutrition Impaired (Spinal Cord Injury)  Goal: Optimal Nutrition Intake  Outcome: Progressing     Problem: Orthostatic Hypotension (Spinal Cord Injury)  Goal: Absence of Postural Hypotension  Outcome: Progressing     Problem: Pain (Spinal Cord Injury)  Goal: Acceptable Pain Control  Outcome: Progressing     Problem: Respiratory Compromise (Spinal Cord Injury)  Goal: Effective Oxygenation and Ventilation  Outcome: Progressing     Problem: Sensorimotor Impairment (Spinal Cord Injury)  Goal: Optimal Sensorimotor Function  Outcome: Progressing     Problem: Skin Injury (Spinal Cord Injury)  Goal: Skin Health and Integrity  Outcome: Progressing     Problem: Thermoregulation Alteration (Spinal Cord Injury)  Goal: Body Temperature Remains in Desired Range  Outcome: Progressing     Problem: Urinary Elimination Impaired (Spinal Cord Injury)  Goal: Effective Urinary Elimination  Outcome: Progressing     Problem: Mobility Impairment  Goal: Optimal Mobility  Outcome: Progressing     Problem: Infection  Goal: Infection Symptom Resolution  Outcome: Progressing     Problem: Venous Thromboembolism  Goal: VTE (Venous Thromboembolism) Symptom Resolution  Outcome: Progressing     Problem: Adjustment to Surgery (Extremity Amputation)  Goal: Optimal Coping with Amputation  Outcome: Progressing     Problem: Bleeding (Extremity Amputation)  Goal: Absence of Bleeding  Outcome: Progressing     Problem: Functional Ability Impaired (Extremity Amputation)  Goal: Optimal Functional Ability  Outcome: Progressing     Problem: Pain (Extremity Amputation)  Goal: Acceptable Pain Control  Outcome: Progressing     Problem: Residual Limb Care (Extremity Amputation)  Goal: Optimal Residual Limb Healing  Outcome: Progressing

## 2019-02-07 NOTE — Unmapped (Signed)
PHYSICAL MEDICINE & REHABILITATION  DAILY PROGRESS NOTE       ASSESSMENT:     Kenyon Torralba??is a 43 y.o.??male??with??previously untreated HIV/AIDS??(CD4 142 on admission) who initially presented with dry gangrene to the 2nd and 3rd digits of L hand and pain/weakness/discoloration over his lower extremities, subsequently found to be COVID??+ without respiratory difficulties (now off precautions). 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   - SLP for cognitive and swallow function.  - Neuropsych for higher level cognitive evaluation and coping.  - RT for community re-integration, education, and leisure support services.  - P&O for assistive devices PRN.  - Pharmacy consult for patient and family education on medication management.   - Nutrition consult for diet information/teaching.   - To be discussed in weekly Interdisciplinary Team Conference.  ??  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 Workup significant for MRI showing increased T2 signal in the spinal cord from the level of T10 to the conus medullaris primarily involving the gray matter tracts with focal T2 hyperintensity in the dorsal columns at T10.   ?? Pain Control  ? Tylenol for mild, Tramadol for mod/severe  ? Neuropathic pain: Gabapentin 100mg  TID   ?? Neurogenic Bladder  ? I/O cath q6H (changed 10/7)   ? Maintain cath volumes <500 cc   ? In and Out Catheterization Volume Range in past 24 hours: 300 - 625 mL (4x)  ? Number of bladder incontinence episodes in past 24 hours: 0  ?? Neurogenic Bowel: UMN Bowel  ? Admission KUB with moderate stool burden: s/p cleanout with Mag Citrate   ? 3:2:1 (Colace, Senna, Dulcolax suppository in evening + dig stim) up to [facility] ? Miralax daily d/c'd 10/13   ? Last BM = 02-06-2019 (small formed bowel movement before bowel program and a large brown formed bowel movement with bowel program). He had no other episodes of bowel incontinence on 02-06-2019.   ?? Spasticity  ? No complaints of spasms at this time.  ?? Equipment/Support  ? PRAFO boots to bilateral feet while in bed to prevent ankle contractures  ?? DVT Prophylaxis  ? DVT ppx with Lovenox x 8 weeks (8 weeks = 11/27)   ?? Skin  ? Turn patient Q2H to prevent pressure injuries  ? Maintain nursing skin integrity protocol with daily skin checks  ?? Prognostication/Counseling  ? ASIA Exam to be considered   ? Education regarding patient's diagnosis, anticipated recovery, reintegration to the community, and fertility to be completed  ??  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 MRI spine notable for multiple spinal cord lesions (increased T2??signal from T10??to conus medullaris). MRI brain with??single enhancing??brain lesion to the right??corona radiata. Per radiology,??spinal cord findings most consistent with toxoplasmosis, brain lesion less consistent (not rim enhancing). LP 8/22 notable for elevated cells, lymphocytes, and protein, toxoplasmosis and EBV positive, negative cytology. Sulfadiazine, pyrimethamine, and leucovorin started 8/24 for toxoplasmosis.??Repeat LP 8/25,??again??negative for malignant cells.??CMV+ in the blood but not CSF.??He received??ganciclovir 8/22-8/25.????Was initially switched to Bactrim (10/5-10/6) for maintenance notes per ID recs, but switched to sulfadiazine/pyrimethamine (10/7 - ) due to concerns from pharmacy.  - S/p induction therapy with  Sulfadiazine,  pyrimethamine, and leucovorin for 6 weeks (8/24 - 10/5)  - Sulfadiazine/Pyrimethamine/Leuovorin (maintenance dose) (10/7 - ) Klebsiella UTI: resolved. Patient febrile after admission. Urine culture growing Klebsiella that is ampicillin and TMP-SMX resistant. Given persistent fevers, ID was consulted and recommended blood cx and CXR.  Lungs clear on CXR 10/6. Blood cultures NGTD.   - S/p Ceftriaxone (10/3 - 10/6) & Levofloxacin 750mg  po qd (10/7 - 10/16) x10 days per ID   ??  HIV/AIDS: Last CD4 = 231/14%. Diagnosed in 2007 and was treatment naive on admission (had previously declined ART due to religious reasons). Started Tivacay/Descovy 8/26.??  - Plan for switch to [facility] on discharge.  - Dr. Dolores Frame has initiated process to coordinate outpatient follow-up and drug assistance enrollment.  - Will need bactrim prophylaxis for PJP once therapy for Toxo completed (hold for now)  - Nystatin d/c'd 10/13   ????????????????????  Dry gangrene L hand   Etiology remains unclear, likely multifactorial. Bilateral UE arterial PVLs without any HD-significant findings and blood cultures obtained in the ED were negative. Has had extensive work up including multispecialty consultations including rheumatology, vascular surgery, hematology. Likely early atherosclerotic disease and recommended baby aspirin and agree with starting a statin (see ASCVD risk, below).   - Outpatient follow-up in hand clinic   - bandage changes daily   ??  Tinea pedis??and onychomycosis: Trichophyton confirmed on biopsy.   - Griseofulvin 375mg  BID x 6 months total (9/16 - )  - Continue econazole cream to bilateral feet BID  ??  ASCVD Risk: Pooled Cohort 10-yr risk is 3%, however, HIV is an independent risk factor for CVD which is not included in the risk score. Pt has atherosclerotic disease in his lower extremities and dyslipidemia.   - Atorvastatin 20mg  daily  - Aspirin 81mg  daily   ??  COVID-19 infection (resolved)  - Asymptomatic. Completed 5 days remdesivir.??  - Date of positive SARS-CoV-2 PCR: 12/09/2018 at OSH. - Patient was 21 days from positive PCR on 9/9 and precautions discontinued. ????  ??  Insomnia  - Melatonin 3mg  qhs  ??  FEN/PPX:  - Diet: Regular   - Fluids: Encourage PO hydration.  - Electrolytes: Monitor and replete PRN.  - DVT ppx: High risk for VTE given limited mobility during hospitalization. Lovenox.  - GI ppx: Not indicated at this time.    SUBJECTIVE:     Interval History: A Spanish interpreter was used for this encounter. No acute events overnight. Per nursing, the patient has not been performing self in and out catheterization overnight for the past 2 nights which most likely explain his cath volume of 625 mL that was recorded at 1100 on 02-06-2019. He had one BM yesterday before his bowel program and then one BM after his bowel program. He denies bladder and bowel incontinence episodes. He also states that his pain is controlled at this time.     OBJECTIVE:     Vital signs (last 24 hours):   Temp:  [36.4 ??C-36.7 ??C] 36.7 ??C  Heart Rate:  [80-118] 80  Resp:  [14-19] 14  BP: (128-137)/(85-93) 128/85  MAP (mmHg):  [106] 106  SpO2:  [98 %-99 %] 99 %    Intake/Output:  I/O last 3 completed shifts:  In: 340 [P.O.:340]  Out: 2725 [Urine:2725]    Physical Exam:    GEN: Middle aged male, lying in bed, no acute distress  HEENT: Normcephalic, sclera anicteric   Cardio: Warm and well perfused, no lower extremity edema noted  Resp: Normal work of breathing on room air  Abdomen: non distended  GU: No foley catheter  Neuro: Awake and Alert. Speech is fluent and coherent in Spanish. Comprehension is intact in Spanish. Follows commands appropriately in Spanish. Flaccid BLE.   Psych: Calm and cooperative      Labs: I have reviewed the labs and studies from the last 24hrs. This patient is admitted to the Physical Medicine and Rehabilitation - Inpatient - A service, please page [phone] service, please contact the Thereasa Parkin of this note 8am-5pm on weekdays for questions regarding this patient. After hours and on weekends please contact the 1st call reisdent pager     Johnnette Litter, DO PGY-4  [facility] Physical Medicine and Rehabilitation

## 2019-02-08 LAB — CBC
HEMATOCRIT: 40.4 % — ABNORMAL LOW (ref 41.0–53.0)
HEMOGLOBIN: 13.5 g/dL (ref 13.5–17.5)
MEAN CORPUSCULAR HEMOGLOBIN CONC: 33.5 g/dL (ref 31.0–37.0)
MEAN CORPUSCULAR HEMOGLOBIN: 29.3 pg (ref 26.0–34.0)
MEAN CORPUSCULAR VOLUME: 87.6 fL (ref 80.0–100.0)
MEAN PLATELET VOLUME: 8.4 fL (ref 7.0–10.0)
PLATELET COUNT: 316 10*9/L (ref 150–440)
RED CELL DISTRIBUTION WIDTH: 17 % — ABNORMAL HIGH (ref 12.0–15.0)
WBC ADJUSTED: 6 10*9/L (ref 4.5–11.0)

## 2019-02-08 LAB — COMPREHENSIVE METABOLIC PANEL
ALBUMIN: 4.2 g/dL (ref 3.5–5.0)
ALKALINE PHOSPHATASE: 108 U/L (ref 38–126)
ALT (SGPT): 21 U/L (ref <50)
AST (SGOT): 29 U/L (ref 19–55)
BILIRUBIN TOTAL: 0.5 mg/dL (ref 0.0–1.2)
BLOOD UREA NITROGEN: 13 mg/dL (ref 7–21)
CALCIUM: 9.2 mg/dL (ref 8.5–10.2)
CHLORIDE: 102 mmol/L (ref 98–107)
CO2: 28 mmol/L (ref 22.0–30.0)
CREATININE: 0.69 mg/dL — ABNORMAL LOW (ref 0.70–1.30)
EGFR CKD-EPI AA MALE: >90 mL/min/{1.73_m2} (ref >=60)
EGFR CKD-EPI NON-AA MALE: >90 mL/min/{1.73_m2} (ref >=60)
GLUCOSE RANDOM: 87 mg/dL (ref 70–179)
POTASSIUM: 4.4 mmol/L (ref 3.5–5.0)
PROTEIN TOTAL: 7.9 g/dL (ref 6.5–8.3)
SODIUM: 137 mmol/L (ref 135–145)

## 2019-02-08 LAB — WBC ADJUSTED: Leukocytes:NCnc:Pt:Bld:Qn:: 6

## 2019-02-08 LAB — MAGNESIUM: Magnesium:MCnc:Pt:Ser/Plas:Qn:: 1.7

## 2019-02-08 LAB — ALBUMIN: Albumin:MCnc:Pt:Ser/Plas:Qn:: 4.2

## 2019-02-08 NOTE — Unmapped (Signed)
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   - SLP for cognitive and swallow function.  - Neuropsych for higher level cognitive evaluation and coping.  - RT for community re-integration, education, and leisure support services.  - P&O for assistive devices PRN.  - Pharmacy consult for patient and family education on medication management.   - Nutrition consult for diet information/teaching.   - To be discussed in weekly Interdisciplinary Team Conference.  ??  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 Workup significant for MRI showing increased T2 signal in the spinal cord from the level of T10 to the conus medullaris primarily involving the gray matter tracts with focal T2 hyperintensity in the dorsal columns at T10.   ?? Pain Control  ? Tylenol for mild, Tramadol for mod/severe  ? Neuropathic pain: Gabapentin 100mg  TID   ?? Neurogenic Bladder  ? I/O cath q6H (changed 10/7)   ? Maintain cath volumes <500 cc   ?? Neurogenic Bowel: UMN Bowel  ? Admission KUB with moderate stool burden: s/p cleanout with Mag Citrate   ? 3:2:1 (Colace, Senna, Dulcolax suppository in evening + dig stim) up to [facility]   ? Miralax daily d/c'd 10/13   ?? Spasticity  ? No complaints of spasms at this time.  ?? Equipment/Support ? PRAFO boots to bilateral feet while in bed to prevent ankle contractures  ?? DVT Prophylaxis  ? DVT ppx with Lovenox x 8 weeks (8 weeks = 11/27)   ?? Skin  ? Turn patient Q2H to prevent pressure injuries  ? Maintain nursing skin integrity protocol with daily skin checks  ?? Prognostication/Counseling  ? ASIA Exam to be considered   ? Education regarding patient's diagnosis, anticipated recovery, reintegration to the community, and fertility to be completed  ??  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 MRI spine notable for multiple spinal cord lesions (increased T2??signal from T10??to conus medullaris). MRI brain with??single enhancing??brain lesion to the right??corona radiata. Per radiology,??spinal cord findings most consistent with toxoplasmosis, brain lesion less consistent (not rim enhancing). LP 8/22 notable for elevated cells, lymphocytes, and protein, toxoplasmosis and EBV positive, negative cytology. Sulfadiazine, pyrimethamine, and leucovorin started 8/24 for toxoplasmosis.??Repeat LP 8/25,??again??negative for malignant cells.??CMV+ in the blood but not CSF.??He received??ganciclovir 8/22-8/25.????Was initially switched to Bactrim (10/5-10/6) for maintenance notes per ID recs, but switched to sulfadiazine/pyrimethamine (10/7 - ) due to concerns from pharmacy.  - S/p induction therapy with Sulfadiazine,  pyrimethamine, and leucovorin for 6 weeks (8/24 - 10/5)  - Sulfadiazine/Pyrimethamine/Leuovorin (maintenance dose) (10/7 - )  ??  HIV/AIDS: Last CD4 = 231/14%. Diagnosed in 2007 and was treatment naive on admission (had previously declined ART due to religious reasons). Started Tivacay/Descovy 8/26.??  - Plan for switch to [facility] on discharge.  - Dr. Dolores Frame has initiated process to  coordinate outpatient follow-up and drug assistance enrollment.  - Will need bactrim prophylaxis for PJP once therapy for Toxo completed (hold for now) Dry gangrene L hand   Etiology remains unclear, likely multifactorial. Bilateral UE arterial PVLs without any HD-significant findings and blood cultures obtained in the ED were negative. Has had extensive work up including multispecialty consultations including rheumatology, vascular surgery, hematology. Likely early atherosclerotic disease and recommended baby aspirin and agree with starting a statin (see ASCVD risk, below).   - Outpatient follow-up in hand clinic   - bandage changes daily   ??  Tinea pedis??and onychomycosis: Trichophyton confirmed on biopsy.   - Griseofulvin 375mg  BID x 6 months total (9/16 - )  - Continue econazole cream to bilateral feet BID  ??  ASCVD Risk: Pooled Cohort 10-yr risk is 3%, however, HIV is an independent risk factor for CVD which is not included in the risk score. Pt has atherosclerotic disease in his lower extremities and dyslipidemia.   - Atorvastatin 20mg  daily  - Aspirin 81mg  daily   ??  COVID-19 infection (resolved)  - Asymptomatic. Completed 5 days remdesivir.??  - Date of positive SARS-CoV-2 PCR: 12/09/2018 at OSH.   - Patient was 21 days from positive PCR on 9/9 and precautions discontinued. ????  ??  Insomnia  - Melatonin 3mg  qhs  ??  FEN/PPX:  - Diet: Regular   - Fluids: Encourage PO hydration.  - Electrolytes: Monitor and replete PRN.  - DVT ppx: High risk for VTE given limited mobility during hospitalization. Lovenox.  - GI ppx: Not indicated at this time.    SUBJECTIVE:     Interval History: NAEO. Spoke to patient during therapy with Spanish interpreter. Reports things are going well. No issues with bowel or bladder program. No complaints of pain.     OBJECTIVE:     Vital signs (last 24 hours):   Temp:  [36.6 ??C-36.9 ??C] 36.8 ??C  Heart Rate:  [85-97] 85  Resp:  [15-18] 18  BP: (120-129)/(82-96) 120/82  MAP (mmHg):  [91] 91  SpO2:  [100 %] 100 %    Intake/Output:  I/O last 3 completed shifts:  In: 280 [P.O.:280]  Out: 2455 [Urine:2455]    Physical Exam: GEN: Middle aged male, lying in bed, no acute distress  HEENT: Normcephalic, sclera anicteric   Cardio: Warm and well perfused, no lower extremity edema noted  Resp: Normal work of breathing on room air  Abdomen: non distended  GU: No foley catheter  Neuro: Awake and Alert. Flaccid BLE.   Psych: Calm and cooperative      Labs: I have reviewed the labs and studies from the last 24hrs.    Imaging: reviewed.         Sueanne Margarita, D.O., PGY-2  [facility] Physical Medicine & Rehabilitation  Between 8am-5pm Monday-Friday, please page [phone]. After hours, please page PM&R 1st call.

## 2019-02-08 NOTE — Unmapped (Signed)
Patient is alert and awake.I and O cath q 6 hours.Bowel program q pm.No new skin issues.Will continue to monitor.  Problem: Rehabilitation (IRF) Plan of Care  Goal: Plan of Care Review  Outcome: Progressing  Goal: Patient-Specific Goal (Individualization)  Outcome: Progressing  Goal: Absence of Hospital-Acquired Illness or Injury  Outcome: Progressing  Goal: Home Safety Plan Established  Outcome: Progressing  Goal: Demonstration of Effective Coping Strategies  Outcome: Progressing  Goal: Community Reintegration Plan Established  Outcome: Progressing     Problem: Fall Injury Risk  Goal: Absence of Fall and Fall-Related Injury  Outcome: Progressing     Problem: Wound  Goal: Optimal Wound Healing  Outcome: Progressing     Problem: Skin Injury Risk Increased  Goal: Skin Health and Integrity  Outcome: Progressing     Problem: Self-Care Deficit  Goal: Improved Ability to Complete Activities of Daily Living  Outcome: Progressing     Problem: Sensory Impairment  Goal: Sensory Impairment Goal: Effective Self-Management of Sensory Impairment  Outcome: Progressing     Problem: Tissue Perfusion Altered  Goal: Improved Tissue Perfusion  Outcome: Progressing     Problem: Adjustment to Injury (Spinal Cord Injury)  Goal: Optimal Coping with Life-Changing Injury  Outcome: Progressing     Problem: Bowel Elimination Impaired (Spinal Cord Injury)  Goal: Effective Bowel Elimination  Outcome: Progressing     Problem: Embolism (Spinal Cord Injury)  Goal: Absence of Embolism Signs/Symptoms  Outcome: Progressing     Problem: Extended Neurologic Impairment (Spinal Cord Injury)  Goal: Absence of Extended Neurologic Injury  Outcome: Progressing     Problem: Functional Ability Impaired (Spinal Cord Injury)  Goal: Optimal Functional Ability  Outcome: Progressing     Problem: Hemodynamic Instability (Spinal Cord Injury)  Goal: Hemodynamic Stability Maintained  Outcome: Progressing     Problem: Neurogenic Shock (Spinal Cord Injury) Goal: Effective Tissue Perfusion  Outcome: Progressing     Problem: Nutrition Impaired (Spinal Cord Injury)  Goal: Optimal Nutrition Intake  Outcome: Progressing     Problem: Orthostatic Hypotension (Spinal Cord Injury)  Goal: Absence of Postural Hypotension  Outcome: Progressing     Problem: Pain (Spinal Cord Injury)  Goal: Acceptable Pain Control  Outcome: Progressing     Problem: Respiratory Compromise (Spinal Cord Injury)  Goal: Effective Oxygenation and Ventilation  Outcome: Progressing     Problem: Sensorimotor Impairment (Spinal Cord Injury)  Goal: Optimal Sensorimotor Function  Outcome: Progressing     Problem: Skin Injury (Spinal Cord Injury)  Goal: Skin Health and Integrity  Outcome: Progressing     Problem: Thermoregulation Alteration (Spinal Cord Injury)  Goal: Body Temperature Remains in Desired Range  Outcome: Progressing     Problem: Urinary Elimination Impaired (Spinal Cord Injury)  Goal: Effective Urinary Elimination  Outcome: Progressing     Problem: Mobility Impairment  Goal: Optimal Mobility  Outcome: Progressing     Problem: Infection  Goal: Infection Symptom Resolution  Outcome: Progressing     Problem: Venous Thromboembolism  Goal: VTE (Venous Thromboembolism) Symptom Resolution  Outcome: Progressing     Problem: Adjustment to Surgery (Extremity Amputation)  Goal: Optimal Coping with Amputation  Outcome: Progressing     Problem: Bleeding (Extremity Amputation)  Goal: Absence of Bleeding  Outcome: Progressing     Problem: Functional Ability Impaired (Extremity Amputation)  Goal: Optimal Functional Ability  Outcome: Progressing     Problem: Pain (Extremity Amputation)  Goal: Acceptable Pain Control  Outcome: Progressing     Problem: Residual Limb Care (Extremity Amputation)  Goal: Optimal  Residual Limb Healing  Outcome: Progressing

## 2019-02-08 NOTE — Unmapped (Signed)
Pt I&O cath every 6 hours, pt set up assist.  Bowel program nightly with dig stim.  Turned every  2  hours.  Prafo boot bilaterally.  Dressing change to left foot skin abrasion and left finger abrasion. Call bell within reach. Bed low and locked.

## 2019-02-09 NOTE — Unmapped (Signed)
Patient is A&O.  Regular diet, set up with meals. I&O cath Q6 hours, bowel program QPM. No signs of skin breakdown. No complaints of pain. Skin and safety protocol maintained. Bed in lowest position, call bell within reach, wheels locked. Will continue to monitor.     Problem: Rehabilitation (IRF) Plan of Care  Goal: Plan of Care Review  Outcome: Progressing  Goal: Patient-Specific Goal (Individualization)  Outcome: Progressing  Goal: Absence of Hospital-Acquired Illness or Injury  Outcome: Progressing  Goal: Home Safety Plan Established  Outcome: Progressing  Goal: Demonstration of Effective Coping Strategies  Outcome: Progressing  Goal: Community Reintegration Plan Established  Outcome: Progressing     Problem: Fall Injury Risk  Goal: Absence of Fall and Fall-Related Injury  Outcome: Progressing     Problem: Wound  Goal: Optimal Wound Healing  Outcome: Progressing     Problem: Skin Injury Risk Increased  Goal: Skin Health and Integrity  Outcome: Progressing     Problem: Self-Care Deficit  Goal: Improved Ability to Complete Activities of Daily Living  Outcome: Progressing     Problem: Sensory Impairment  Goal: Sensory Impairment Goal: Effective Self-Management of Sensory Impairment  Outcome: Progressing     Problem: Tissue Perfusion Altered  Goal: Improved Tissue Perfusion  Outcome: Progressing     Problem: Adjustment to Injury (Spinal Cord Injury)  Goal: Optimal Coping with Life-Changing Injury  Outcome: Progressing     Problem: Bowel Elimination Impaired (Spinal Cord Injury)  Goal: Effective Bowel Elimination  Outcome: Progressing     Problem: Embolism (Spinal Cord Injury)  Goal: Absence of Embolism Signs/Symptoms  Outcome: Progressing     Problem: Extended Neurologic Impairment (Spinal Cord Injury)  Goal: Absence of Extended Neurologic Injury  Outcome: Progressing     Problem: Functional Ability Impaired (Spinal Cord Injury)  Goal: Optimal Functional Ability  Outcome: Progressing Problem: Hemodynamic Instability (Spinal Cord Injury)  Goal: Hemodynamic Stability Maintained  Outcome: Progressing     Problem: Neurogenic Shock (Spinal Cord Injury)  Goal: Effective Tissue Perfusion  Outcome: Progressing     Problem: Nutrition Impaired (Spinal Cord Injury)  Goal: Optimal Nutrition Intake  Outcome: Progressing     Problem: Orthostatic Hypotension (Spinal Cord Injury)  Goal: Absence of Postural Hypotension  Outcome: Progressing     Problem: Pain (Spinal Cord Injury)  Goal: Acceptable Pain Control  Outcome: Progressing     Problem: Respiratory Compromise (Spinal Cord Injury)  Goal: Effective Oxygenation and Ventilation  Outcome: Progressing     Problem: Sensorimotor Impairment (Spinal Cord Injury)  Goal: Optimal Sensorimotor Function  Outcome: Progressing     Problem: Skin Injury (Spinal Cord Injury)  Goal: Skin Health and Integrity  Outcome: Progressing     Problem: Thermoregulation Alteration (Spinal Cord Injury)  Goal: Body Temperature Remains in Desired Range  Outcome: Progressing     Problem: Urinary Elimination Impaired (Spinal Cord Injury)  Goal: Effective Urinary Elimination  Outcome: Progressing     Problem: Mobility Impairment  Goal: Optimal Mobility  Outcome: Progressing     Problem: Infection  Goal: Infection Symptom Resolution  Outcome: Progressing     Problem: Venous Thromboembolism  Goal: VTE (Venous Thromboembolism) Symptom Resolution  Outcome: Progressing     Problem: Adjustment to Surgery (Extremity Amputation)  Goal: Optimal Coping with Amputation  Outcome: Progressing     Problem: Bleeding (Extremity Amputation)  Goal: Absence of Bleeding  Outcome: Progressing     Problem: Functional Ability Impaired (Extremity Amputation)  Goal: Optimal Functional Ability  Outcome: Progressing       Problem: Pain (Extremity Amputation)  Goal: Acceptable Pain Control  Outcome: Progressing     Problem: Residual Limb Care (Extremity Amputation)  Goal: Optimal Residual Limb Healing Outcome: Progressing

## 2019-02-09 NOTE — Unmapped (Signed)
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     We have been following a small wound to the left lateral foot. At this time the area is dry and healing. No dressing noted but okay to leave OTA or use a foam. Either is okay at this time. I did notice a wound the the right medial aspect of the right hallux. This almost looks like a reabsorbed DTI but the etiology is unclear so will not classify as such. Area is dry and I am not concerned for opening or further issues. It can be left OTA. Offloading discussed with patient. We are not following the patients finger wound. Nursing aware of plan. We will sign off.                                                      Wound 02/09/19 Other (comment) Foot Right unknown etiology   Date First Assessed/Time First Assessed: 02/09/19 1332   Primary Wound Type: Other (comment)  Location: Foot  Wound Location Orientation: Right  Wound Description (Comments): unknown etiology   Wound Image    Dressing Status      No dressing   Wound Length (cm) 2.2 cm   Wound Width (cm) 1.7 cm   Wound Depth (cm) 0 cm   Wound Surface Area (cm^2) 3.74 cm^2   Wound Volume (cm^3) 0 cm^3   Wound Bed Brown   Peri-wound Assessment      Intact   Dressing Open to air         Lab Results   Component Value Date    WBC 6.0 02/08/2019    HGB 13.5 02/08/2019    HCT 40.4 (L) 02/08/2019 ESR 61 (H) 01/03/2019    CRP 18.2 (H) 01/03/2019    A1C 5.5 01/04/2019    GLUF 99 11/05/2010    GLU 87 02/08/2019    POCGLU 124 01/23/2019    ALBUMIN 4.2 02/08/2019    PROT 7.9 02/08/2019     Support Surface:   - Low Air Loss    Offloading:  Left: Pillow  Right: Pillow    Type Debridement Completed By WOCN:  N/A    Teaching:  - Turning and repositioning  - Wound care    WOCN Recommendations:   - See nursing orders for wound care instructions.  - Contact WOCN with questions, concerns, or wound deterioration.    Topical Therapy/Interventions:   - open to air  Recommended Consults:  - Not Applicable    WOCN Follow Up:  - Weekly    Plan of Care Discussed With:   - Patient  - RN amanda    Supplies Ordered: No    Workup Time:   30 minutes     Hamilton Capri, RN-BSN, [facility]  [facility] Wound Arboriculturist   [phone]

## 2019-02-09 NOTE — Unmapped (Signed)
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   - SLP for cognitive and swallow function.  - Neuropsych for higher level cognitive evaluation and coping.  - RT for community re-integration, education, and leisure support services.  - P&O for assistive devices PRN.  - Pharmacy consult for patient and family education on medication management.   - Nutrition consult for diet information/teaching.   - To be discussed in weekly Interdisciplinary Team Conference.  ??  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 Workup significant for MRI showing increased T2 signal in the spinal cord from the level of T10 to the conus medullaris primarily involving the gray matter tracts with focal T2 hyperintensity in the dorsal columns at T10.   ?? Pain Control  ? Tylenol for mild, Tramadol for mod/severe  ? Neuropathic pain: Gabapentin 100mg  TID   ?? Neurogenic Bladder  ? I/O cath q6H (changed 10/7)   ? Maintain cath volumes <500 cc   ?? Neurogenic Bowel: UMN Bowel  ? Admission KUB with moderate stool burden: s/p cleanout with Mag Citrate   ? 3:2:1 (Colace, Senna, Dulcolax suppository in evening + dig stim) up to [facility]   ? Miralax daily d/c'd 10/13   ?? Spasticity  ? No complaints of spasms at this time.  ?? Equipment/Support ? PRAFO boots to bilateral feet while in bed to prevent ankle contractures  ?? DVT Prophylaxis  ? DVT ppx with Lovenox x 8 weeks (8 weeks = 11/27)   ?? Skin  ? Turn patient Q2H to prevent pressure injuries  ? Maintain nursing skin integrity protocol with daily skin checks  ?? Prognostication/Counseling  ? ASIA Exam to be considered   ? Education regarding patient's diagnosis, anticipated recovery, reintegration to the community, and fertility to be completed  ??  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 MRI spine notable for multiple spinal cord lesions (increased T2??signal from T10??to conus medullaris). MRI brain with??single enhancing??brain lesion to the right??corona radiata. Per radiology,??spinal cord findings most consistent with toxoplasmosis, brain lesion less consistent (not rim enhancing). LP 8/22 notable for elevated cells, lymphocytes, and protein, toxoplasmosis and EBV positive, negative cytology. Sulfadiazine, pyrimethamine, and leucovorin started 8/24 for toxoplasmosis.??Repeat LP 8/25,??again??negative for malignant cells.??CMV+ in the blood but not CSF.??He received??ganciclovir 8/22-8/25.????Was initially switched to Bactrim (10/5-10/6) for maintenance notes per ID recs, but switched to sulfadiazine/pyrimethamine (10/7 - ) due to concerns from pharmacy.  - S/p induction therapy with Sulfadiazine,  pyrimethamine, and leucovorin for 6 weeks (8/24 - 10/5)  - Sulfadiazine/Pyrimethamine/Leuovorin (maintenance dose) (10/7 - )  ??  HIV/AIDS: Last CD4 = 231/14%. Diagnosed in 2007 and was treatment naive on admission (had previously declined ART due to religious reasons). Started Tivacay/Descovy 8/26.??  - Plan for switch to [facility] on discharge.  - Dr. Dolores Frame has initiated process to  coordinate outpatient follow-up and drug assistance enrollment.  - Will need bactrim prophylaxis for PJP once therapy for Toxo completed (hold for now) Dry gangrene L hand   Etiology remains unclear, likely multifactorial. Bilateral UE arterial PVLs without any HD-significant findings and blood cultures obtained in the ED were negative. Has had extensive work up including multispecialty consultations including rheumatology, vascular surgery, hematology. Likely early atherosclerotic disease and recommended baby aspirin and agree with starting a statin (see ASCVD risk, below).   - Outpatient follow-up in hand clinic   - bandage changes daily   ??  Tinea pedis??and onychomycosis: Trichophyton confirmed on biopsy.   - Griseofulvin 375mg  BID x 6 months total (9/16 - )  - Continue econazole cream to bilateral feet BID  ??  ASCVD Risk: Pooled Cohort 10-yr risk is 3%, however, HIV is an independent risk factor for CVD which is not included in the risk score. Pt has atherosclerotic disease in his lower extremities and dyslipidemia.   - Atorvastatin 20mg  daily  - Aspirin 81mg  daily   ??  COVID-19 infection (resolved)  - Asymptomatic. Completed 5 days remdesivir.??  - Date of positive SARS-CoV-2 PCR: 12/09/2018 at OSH.   - Patient was 21 days from positive PCR on 9/9 and precautions discontinued. ????  ??  Insomnia  - Melatonin 3mg  qhs  ??  FEN/PPX:  - Diet: Regular   - Fluids: Encourage PO hydration.  - Electrolytes: Monitor and replete PRN.  - DVT ppx: High risk for VTE given limited mobility during hospitalization. Lovenox.  - GI ppx: Not indicated at this time.    SUBJECTIVE:     Interval History: NAEO. Did have accidents x2 yesterday, one during therapy and another in the evening after his bowel program (11pm). Prior to this was not having issues with bowel incontinence. Cath volumes remain appropriate. No issues with pain. Has f/u appointment with hand clinic that needs to be rescheduled.     OBJECTIVE:     Vital signs (last 24 hours):   Temp:  [36.4 ??C-36.8 ??C] 36.8 ??C  Heart Rate:  [87-98] 87  Resp:  [16-18] 18  BP: (111-124)/(72-76) 111/76  MAP (mmHg):  [87-88] 88 SpO2:  [98 %-100 %] 98 %    Intake/Output:  I/O last 3 completed shifts:  In: 140 [P.O.:140]  Out: 1830 [Urine:1830]    Physical Exam:    GEN: Middle aged male, lying in bed, no acute distress  HEENT: Normcephalic, sclera anicteric   Cardio: Warm and well perfused, no lower extremity edema noted  Resp: Normal work of breathing on room air  Abdomen: non distended  GU: No foley catheter  Neuro: Awake and Alert. Flaccid BLE.   Psych: Calm and cooperative      Labs: I have reviewed the labs and studies from the last 24hrs.    Imaging: reviewed.         Sueanne Margarita, D.O., PGY-2  [facility] Physical Medicine & Rehabilitation  Between 8am-5pm Monday-Friday, please page [phone]. After hours, please page PM&R 1st call.

## 2019-02-10 NOTE — Unmapped (Signed)
**Note De-Identified Elide Stalzer Obfuscation** I & O cath q 6 hours, performs independently with setup. Bowel program q pm on active aid chair. One bowel accident during the day today. One complaint of pain in finger this morning; tramadol given with good relief. Wound dressing CDI and no new skin breakdown observed. Will continue to monitor.

## 2019-02-10 NOTE — Unmapped (Signed)
Patient is A&O.  Regular diet, set up with meals. I&O cath Q6 hours, bowel program QPM. No signs of skin breakdown. No complaints of pain. Skin and safety protocol maintained. Bed in lowest position, call bell within reach, wheels locked. Will continue to monitor.     Problem: Rehabilitation (IRF) Plan of Care  Goal: Plan of Care Review  Outcome: Progressing  Goal: Patient-Specific Goal (Individualization)  Outcome: Progressing  Goal: Absence of Hospital-Acquired Illness or Injury  Outcome: Progressing  Goal: Home Safety Plan Established  Outcome: Progressing  Goal: Demonstration of Effective Coping Strategies  Outcome: Progressing  Goal: Community Reintegration Plan Established  Outcome: Progressing     Problem: Fall Injury Risk  Goal: Absence of Fall and Fall-Related Injury  Outcome: Progressing     Problem: Wound  Goal: Optimal Wound Healing  Outcome: Progressing     Problem: Skin Injury Risk Increased  Goal: Skin Health and Integrity  Outcome: Progressing     Problem: Self-Care Deficit  Goal: Improved Ability to Complete Activities of Daily Living  Outcome: Progressing     Problem: Sensory Impairment  Goal: Sensory Impairment Goal: Effective Self-Management of Sensory Impairment  Outcome: Progressing     Problem: Tissue Perfusion Altered  Goal: Improved Tissue Perfusion  Outcome: Progressing     Problem: Adjustment to Injury (Spinal Cord Injury)  Goal: Optimal Coping with Life-Changing Injury  Outcome: Progressing     Problem: Bowel Elimination Impaired (Spinal Cord Injury)  Goal: Effective Bowel Elimination  Outcome: Progressing     Problem: Embolism (Spinal Cord Injury)  Goal: Absence of Embolism Signs/Symptoms  Outcome: Progressing     Problem: Extended Neurologic Impairment (Spinal Cord Injury)  Goal: Absence of Extended Neurologic Injury  Outcome: Progressing     Problem: Functional Ability Impaired (Spinal Cord Injury)  Goal: Optimal Functional Ability  Outcome: Progressing Problem: Hemodynamic Instability (Spinal Cord Injury)  Goal: Hemodynamic Stability Maintained  Outcome: Progressing     Problem: Neurogenic Shock (Spinal Cord Injury)  Goal: Effective Tissue Perfusion  Outcome: Progressing     Problem: Nutrition Impaired (Spinal Cord Injury)  Goal: Optimal Nutrition Intake  Outcome: Progressing     Problem: Orthostatic Hypotension (Spinal Cord Injury)  Goal: Absence of Postural Hypotension  Outcome: Progressing     Problem: Pain (Spinal Cord Injury)  Goal: Acceptable Pain Control  Outcome: Progressing     Problem: Respiratory Compromise (Spinal Cord Injury)  Goal: Effective Oxygenation and Ventilation  Outcome: Progressing     Problem: Sensorimotor Impairment (Spinal Cord Injury)  Goal: Optimal Sensorimotor Function  Outcome: Progressing     Problem: Skin Injury (Spinal Cord Injury)  Goal: Skin Health and Integrity  Outcome: Progressing     Problem: Thermoregulation Alteration (Spinal Cord Injury)  Goal: Body Temperature Remains in Desired Range  Outcome: Progressing     Problem: Urinary Elimination Impaired (Spinal Cord Injury)  Goal: Effective Urinary Elimination  Outcome: Progressing     Problem: Mobility Impairment  Goal: Optimal Mobility  Outcome: Progressing     Problem: Infection  Goal: Infection Symptom Resolution  Outcome: Progressing     Problem: Venous Thromboembolism  Goal: VTE (Venous Thromboembolism) Symptom Resolution  Outcome: Progressing     Problem: Adjustment to Surgery (Extremity Amputation)  Goal: Optimal Coping with Amputation  Outcome: Progressing     Problem: Bleeding (Extremity Amputation)  Goal: Absence of Bleeding  Outcome: Progressing     Problem: Functional Ability Impaired (Extremity Amputation)  Goal: Optimal Functional Ability  Outcome: Progressing       Problem: Pain (Extremity Amputation)  Goal: Acceptable Pain Control  Outcome: Progressing     Problem: Residual Limb Care (Extremity Amputation)  Goal: Optimal Residual Limb Healing Outcome: Progressing

## 2019-02-10 NOTE — Unmapped (Signed)
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   - SLP for cognitive and swallow function.  - Neuropsych for higher level cognitive evaluation and coping.  - RT for community re-integration, education, and leisure support services.  - P&O for assistive devices PRN.  - Pharmacy consult for patient and family education on medication management.   - Nutrition consult for diet information/teaching.   - To be discussed in weekly Interdisciplinary Team Conference.  ??  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 Workup significant for MRI showing increased T2 signal in the spinal cord from the level of T10 to the conus medullaris primarily involving the gray matter tracts with focal T2 hyperintensity in the dorsal columns at T10.   ?? Pain Control  ? Tylenol for mild, Tramadol for mod/severe  ?? Neurogenic Bladder  ? I/O cath q6H (changed 10/7)   ? Maintain cath volumes <500 cc   ?? Neurogenic Bowel: UMN Bowel  ? Admission KUB with moderate stool burden: s/p cleanout with Mag Citrate   ? 3:2:1 (Colace, Senna, Dulcolax suppository in evening + dig stim) up to [facility]   ? Miralax daily d/c'd 10/13   ?? Spasticity  ? No complaints of spasms at this time.  ?? Equipment/Support  ? PRAFO boots to bilateral feet while in bed to prevent ankle contractures ?? DVT Prophylaxis  ? DVT ppx with Lovenox x 8 weeks (8 weeks = 11/27)   ?? Skin  ? Turn patient Q2H to prevent pressure injuries  ? Maintain nursing skin integrity protocol with daily skin checks  ?? Prognostication/Counseling  ? ASIA Exam to be considered   ? Education regarding patient's diagnosis, anticipated recovery, reintegration to the community, and fertility to be completed  ??  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 MRI spine notable for multiple spinal cord lesions (increased T2??signal from T10??to conus medullaris). MRI brain with??single enhancing??brain lesion to the right??corona radiata. Per radiology,??spinal cord findings most consistent with toxoplasmosis, brain lesion less consistent (not rim enhancing). LP 8/22 notable for elevated cells, lymphocytes, and protein, toxoplasmosis and EBV positive, negative cytology. Sulfadiazine, pyrimethamine, and leucovorin started 8/24 for toxoplasmosis.??Repeat LP 8/25,??again??negative for malignant cells.??CMV+ in the blood but not CSF.??He received??ganciclovir 8/22-8/25.????Was initially switched to Bactrim (10/5-10/6) for maintenance notes per ID recs, but switched to sulfadiazine/pyrimethamine (10/7 - ) due to concerns from pharmacy.  - S/p induction therapy with Sulfadiazine,  pyrimethamine, and leucovorin for 6 weeks (8/24 - 10/5)  - Sulfadiazine/Pyrimethamine/Leuovorin (maintenance dose) (10/7 - )  ??  HIV/AIDS: Last CD4 = 231/14%. Diagnosed in 2007 and was treatment naive on admission (had previously declined ART due to religious reasons). Started Tivacay/Descovy 8/26.??  - Plan for switch to [facility] on discharge.  - Dr. Dolores Frame has initiated process to coordinate outpatient follow-up and drug assistance enrollment.  -  Will need bactrim prophylaxis for PJP once therapy for Toxo completed (hold for now)  ????????????????????  Dry gangrene L hand Etiology remains unclear, likely multifactorial. Bilateral UE arterial PVLs without any HD-significant findings and blood cultures obtained in the ED were negative. Has had extensive work up including multispecialty consultations including rheumatology, vascular surgery, hematology. Likely early atherosclerotic disease and recommended baby aspirin and agree with starting a statin (see ASCVD risk, below).   - Outpatient follow-up in hand clinic   - bandage changes daily   ??  Tinea pedis??and onychomycosis: Trichophyton confirmed on biopsy.   - Griseofulvin 375mg  BID x 6 months total (9/16 - )  - Continue econazole cream to bilateral feet BID  ??  ASCVD Risk: Pooled Cohort 10-yr risk is 3%, however, HIV is an independent risk factor for CVD which is not included in the risk score. Pt has atherosclerotic disease in his lower extremities and dyslipidemia.   - Atorvastatin 20mg  daily  - Aspirin 81mg  daily   ??  COVID-19 infection (resolved)  - Asymptomatic. Completed 5 days remdesivir.??  - Date of positive SARS-CoV-2 PCR: 12/09/2018 at OSH.   - Patient was 21 days from positive PCR on 9/9 and precautions discontinued. ????  ??  Insomnia  - Melatonin 3mg  qhs  ??  FEN/PPX:  - Diet: Regular   - Fluids: Encourage PO hydration.  - Electrolytes: Monitor and replete PRN.  - DVT ppx: High risk for VTE given limited mobility during hospitalization. Lovenox.  - GI ppx: Not indicated at this time.    SUBJECTIVE:     Interval History: NAEO. No accidents yesterday per patient. No stool with bowel program yesterday. Will defer changes today. Will reschedule patient's outpatient ID follow up. Patient reporting increasing pain to left hand. There is increased swelling along his joints. Was supposed to follow up with SRO as outpatient but he remains inpatient. Ortho will assess the left hand today or tomorrow.     OBJECTIVE:     Vital signs (last 24 hours):   Temp:  [36.7 ??C] 36.7 ??C  Heart Rate:  [86-88] 88  Resp:  [16-18] 18 BP: (119-123)/(77-87) 123/87  MAP (mmHg):  [89-99] 99  SpO2:  [100 %] 100 %    Intake/Output:  I/O last 3 completed shifts:  In: -   Out: 1975 [Urine:1975]    Physical Exam:    GEN: Middle aged male, lying in bed, no acute distress  HEENT: Normcephalic, sclera anicteric   Cardio: Warm and well perfused, no lower extremity edema noted  Resp: Normal work of breathing on room air  Abdomen: non distended  MSK: tenderness to light touch and pressure along MTP and PIP/DIP joints of 2-4th digits of left hand.   GU: No foley catheter  Neuro: Awake and Alert. Flaccid BLE.   Psych: Calm and cooperative      Labs: I have reviewed the labs and studies from the last 24hrs.    Imaging: reviewed.         Sueanne Margarita, D.O., PGY-2  [facility] Physical Medicine & Rehabilitation  Between 8am-5pm Monday-Friday, please page [phone]. After hours, please page PM&R 1st call.

## 2019-02-10 NOTE — Unmapped (Signed)
**Note De-Identified Don [name] Obfuscation** Self-cath, bowel program q pm. No BM with program and no accidents during the day. Pain under control with current medications. Dressings CDI and no new skin breakdown noted.

## 2019-02-11 LAB — COMPREHENSIVE METABOLIC PANEL
ALBUMIN: 4 g/dL (ref 3.5–5.0)
ALKALINE PHOSPHATASE: 83 U/L (ref 38–126)
ALT (SGPT): 22 U/L (ref <50)
ANION GAP: 11 mmol/L (ref 7–15)
AST (SGOT): 27 U/L (ref 19–55)
BILIRUBIN TOTAL: 0.5 mg/dL (ref 0.0–1.2)
BLOOD UREA NITROGEN: 14 mg/dL (ref 7–21)
BUN / CREAT RATIO: 18
CALCIUM: 9.5 mg/dL (ref 8.5–10.2)
CHLORIDE: 101 mmol/L (ref 98–107)
CO2: 27 mmol/L (ref 22.0–30.0)
CREATININE: 0.8 mg/dL (ref 0.70–1.30)
EGFR CKD-EPI AA MALE: >90 mL/min/{1.73_m2} (ref >=60)
EGFR CKD-EPI NON-AA MALE: >90 mL/min/{1.73_m2} (ref >=60)
GLUCOSE RANDOM: 110 mg/dL (ref 70–179)
POTASSIUM: 4.2 mmol/L (ref 3.5–5.0)
PROTEIN TOTAL: 7.4 g/dL (ref 6.5–8.3)
SODIUM: 139 mmol/L (ref 135–145)

## 2019-02-11 LAB — CBC
HEMATOCRIT: 38.3 % — ABNORMAL LOW (ref 41.0–53.0)
HEMOGLOBIN: 12.9 g/dL — ABNORMAL LOW (ref 13.5–17.5)
MEAN CORPUSCULAR HEMOGLOBIN CONC: 33.7 g/dL (ref 31.0–37.0)
MEAN CORPUSCULAR HEMOGLOBIN: 29.3 pg (ref 26.0–34.0)
MEAN CORPUSCULAR VOLUME: 86.9 fL (ref 80.0–100.0)
MEAN PLATELET VOLUME: 7.6 fL (ref 7.0–10.0)
RED CELL DISTRIBUTION WIDTH: 16.9 % — ABNORMAL HIGH (ref 12.0–15.0)
WBC ADJUSTED: 5.8 10*9/L (ref 4.5–11.0)

## 2019-02-11 LAB — HEMATOCRIT: Hematocrit:VFr:Pt:Bld:Qn:: 38.3 — ABNORMAL LOW

## 2019-02-11 LAB — CALCIUM: Calcium:MCnc:Pt:Ser/Plas:Qn:: 9.5

## 2019-02-11 LAB — MAGNESIUM: Magnesium:MCnc:Pt:Ser/Plas:Qn:: 1.7

## 2019-02-11 NOTE — Unmapped (Signed)
Patient is A&O.  Regular diet, set up with meals. I&O cath Q6 hours, bowel program QPM. No signs of skin breakdown. No complaints of pain. Skin and safety protocol maintained. Bed in lowest position, call bell within reach, wheels locked. Will continue to monitor.     Problem: Rehabilitation (IRF) Plan of Care  Goal: Plan of Care Review  Outcome: Progressing  Goal: Patient-Specific Goal (Individualization)  Outcome: Progressing  Goal: Absence of Hospital-Acquired Illness or Injury  Outcome: Progressing  Goal: Home Safety Plan Established  Outcome: Progressing  Goal: Demonstration of Effective Coping Strategies  Outcome: Progressing  Goal: Community Reintegration Plan Established  Outcome: Progressing     Problem: Fall Injury Risk  Goal: Absence of Fall and Fall-Related Injury  Outcome: Progressing     Problem: Wound  Goal: Optimal Wound Healing  Outcome: Progressing     Problem: Skin Injury Risk Increased  Goal: Skin Health and Integrity  Outcome: Progressing     Problem: Self-Care Deficit  Goal: Improved Ability to Complete Activities of Daily Living  Outcome: Progressing     Problem: Sensory Impairment  Goal: Sensory Impairment Goal: Effective Self-Management of Sensory Impairment  Outcome: Progressing     Problem: Tissue Perfusion Altered  Goal: Improved Tissue Perfusion  Outcome: Progressing     Problem: Adjustment to Injury (Spinal Cord Injury)  Goal: Optimal Coping with Life-Changing Injury  Outcome: Progressing     Problem: Bowel Elimination Impaired (Spinal Cord Injury)  Goal: Effective Bowel Elimination  Outcome: Progressing     Problem: Embolism (Spinal Cord Injury)  Goal: Absence of Embolism Signs/Symptoms  Outcome: Progressing     Problem: Extended Neurologic Impairment (Spinal Cord Injury)  Goal: Absence of Extended Neurologic Injury  Outcome: Progressing     Problem: Functional Ability Impaired (Spinal Cord Injury)  Goal: Optimal Functional Ability  Outcome: Progressing Problem: Hemodynamic Instability (Spinal Cord Injury)  Goal: Hemodynamic Stability Maintained  Outcome: Progressing     Problem: Neurogenic Shock (Spinal Cord Injury)  Goal: Effective Tissue Perfusion  Outcome: Progressing     Problem: Nutrition Impaired (Spinal Cord Injury)  Goal: Optimal Nutrition Intake  Outcome: Progressing     Problem: Orthostatic Hypotension (Spinal Cord Injury)  Goal: Absence of Postural Hypotension  Outcome: Progressing     Problem: Pain (Spinal Cord Injury)  Goal: Acceptable Pain Control  Outcome: Progressing     Problem: Respiratory Compromise (Spinal Cord Injury)  Goal: Effective Oxygenation and Ventilation  Outcome: Progressing     Problem: Sensorimotor Impairment (Spinal Cord Injury)  Goal: Optimal Sensorimotor Function  Outcome: Progressing     Problem: Skin Injury (Spinal Cord Injury)  Goal: Skin Health and Integrity  Outcome: Progressing     Problem: Thermoregulation Alteration (Spinal Cord Injury)  Goal: Body Temperature Remains in Desired Range  Outcome: Progressing     Problem: Urinary Elimination Impaired (Spinal Cord Injury)  Goal: Effective Urinary Elimination  Outcome: Progressing     Problem: Mobility Impairment  Goal: Optimal Mobility  Outcome: Progressing     Problem: Infection  Goal: Infection Symptom Resolution  Outcome: Progressing     Problem: Venous Thromboembolism  Goal: VTE (Venous Thromboembolism) Symptom Resolution  Outcome: Progressing     Problem: Adjustment to Surgery (Extremity Amputation)  Goal: Optimal Coping with Amputation  Outcome: Progressing     Problem: Bleeding (Extremity Amputation)  Goal: Absence of Bleeding  Outcome: Progressing     Problem: Functional Ability Impaired (Extremity Amputation)  Goal: Optimal Functional Ability  Outcome: Progressing  Problem: Pain (Extremity Amputation)  Goal: Acceptable Pain Control  Outcome: Progressing     Problem: Residual Limb Care (Extremity Amputation)  Goal: Optimal Residual Limb Healing Outcome: Progressing

## 2019-02-11 NOTE — Unmapped (Signed)
ORTHOPAEDIC PROGRESS NOTE      - Primary Service for this Patient: Physical Medicine and Rehabilitation [facility]).    ASSESSMENT & PLAN:  43 y.o. 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 No signs of acute infection.    ?? No surgical intervention indicated at this time. Will allow index finger to continue to demarcate and potentially auto amputate, appears much better today then compared to initial consultation.  ?? Bedside resection of exposed bone on the long finger was performed 8/20, long finger appears well healed with no signs of infection  ?? Recommend nitro cream to digits for increased pain   ?? Will arrange outpatient follow up in 4-6 weeks for continued follow up in the hand APP clinic.      -----------------------------------------------------------------------------------------------  - Current orthopaedic contact resident: Narayanan/Keil  - Current orthopaedic care attending: Roney Mans  - On nights (6pm-6am), weekends, and holidays, please page Orthopaedic Consult pager.  * Please page Clement Sayres, NP [phone] with any questions while patient in inpatient. Please contact the resident who leaves daily progress notes for any questions between 3-5PM and then Page orthopaedic consult pager [phone]) on nights (after 5PM) and weekends    SUBJECTIVE:  Patient lying comfortably in bed. Pain well controlled. No problems with the right hand.    OBJECTIVE:  PE:  BP 121/86  - Pulse 78  - Temp 36.7 ??C (98.1 ??F) (Oral)  - Resp 18  - Ht 165.1 cm (5' 5)  - Wt 63.7 kg (140 lb 6.9 oz)  - SpO2 100%  - BMI 23.37 kg/m??     Vitals:  Patient Vitals for the past 8 hrs:   BP Temp Temp src Pulse Resp SpO2   02/11/19 0700 121/86 36.7 ??C (98.1 ??F) Oral 78 18 100 %     General:well-nourished and no acute distress    Left Upper Extremity Dry ischemic tip of the index finger distal phalanx, without purulence, erythema, or fluctuance. Long finger with benign healing finger stump without purulence, erythema, or fluctuance  Nontender to palpation throughout extremity  Active ROM is painless at fingers, wrist, elbow, shoulder.   Motor: + OK (AIN median n), EPL (PIN radial n), IO (ulnar n)  SILT in medial, radial, and ulnar distributions.   + radial pulse with warm and well perfused digits.     Test Results  Imaging  None new

## 2019-02-11 NOTE — Unmapped (Signed)
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   ??  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  Don [name] was referred for supportive counseling and coping strategies for recent hospitalization and illness.  Met with him at bedside with interpreter.  He engaged easily and stated that he was feeling better and is hopeful for a good recovery despite his lengthy hospitalization and having COVID.  Don [name] explained that he has a good support system at home.  He also discussed being hopeful of returning to work at some point; encouraged him to talk with his physician about this and for recommendations and time frame.      Don [name] stated that his mood was good and improved since earlier, particularly when he was first admitted.  He described coping strategies as talking with his family and friends.  Explained counseling resources for his home are, but he declined.  No further services required.    Coder: 15 minutes face-to-face with patient. No charge.

## 2019-02-11 NOTE — Unmapped (Signed)
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   - SLP for cognitive and swallow function.  - Neuropsych for higher level cognitive evaluation and coping.  - RT for community re-integration, education, and leisure support services.  - P&O for assistive devices PRN.  - Pharmacy consult for patient and family education on medication management.   - Nutrition consult for diet information/teaching.   - To be discussed in weekly Interdisciplinary Team Conference.  ??  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 Workup significant for MRI showing increased T2 signal in the spinal cord from the level of T10 to the conus medullaris primarily involving the gray matter tracts with focal T2 hyperintensity in the dorsal columns at T10.   ?? Pain Control  ? Tylenol for mild, Tramadol for mod/severe  ?? Neurogenic Bladder  ? I/O cath q6H (changed 10/7)   ? Maintain cath volumes <500 cc   ?? Neurogenic Bowel: UMN Bowel  ? Admission KUB with moderate stool burden: s/p cleanout with Mag Citrate   ? 3:2:1 (Colace, Senna, Dulcolax suppository in evening + dig stim) up to [facility]   ? Miralax daily d/c'd 10/13   ?? Spasticity  ? No complaints of spasms at this time.  ?? Equipment/Support  ? PRAFO boots to bilateral feet while in bed to prevent ankle contractures ?? DVT Prophylaxis  ? DVT ppx with Lovenox x 8 weeks (8 weeks = 11/27)   ?? Skin  ? Turn patient Q2H to prevent pressure injuries  ? Maintain nursing skin integrity protocol with daily skin checks  ?? Prognostication/Counseling  ? ASIA Exam to be considered   ? Education regarding patient's diagnosis, anticipated recovery, reintegration to the community, and fertility to be completed  ??  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 MRI spine notable for multiple spinal cord lesions (increased T2??signal from T10??to conus medullaris). MRI brain with??single enhancing??brain lesion to the right??corona radiata. Per radiology,??spinal cord findings most consistent with toxoplasmosis, brain lesion less consistent (not rim enhancing). LP 8/22 notable for elevated cells, lymphocytes, and protein, toxoplasmosis and EBV positive, negative cytology. Sulfadiazine, pyrimethamine, and leucovorin started 8/24 for toxoplasmosis.??Repeat LP 8/25,??again??negative for malignant cells.??CMV+ in the blood but not CSF.??He received??ganciclovir 8/22-8/25.????Was initially switched to Bactrim (10/5-10/6) for maintenance notes per ID recs, but switched to sulfadiazine/pyrimethamine (10/7 - ) due to concerns from pharmacy.  - S/p induction therapy with Sulfadiazine,  pyrimethamine, and leucovorin for 6 weeks (8/24 - 10/5)  - Sulfadiazine/Pyrimethamine/Leuovorin (maintenance dose) (10/7 - )  ??  HIV/AIDS: Last CD4 = 231/14%. Diagnosed in 2007 and was treatment naive on admission (had previously declined ART due to religious reasons). Started Tivacay/Descovy 8/26.??  - Plan for switch to [facility] on discharge.  - Dr. Dolores Frame has initiated process to coordinate outpatient follow-up and drug assistance enrollment.  -  Will need bactrim prophylaxis for PJP once therapy for Toxo completed (hold for now)  ????????????????????  Dry gangrene L hand Etiology remains unclear, likely multifactorial. Bilateral UE arterial PVLs without any HD-significant findings and blood cultures obtained in the ED were negative. Has had extensive work up including multispecialty consultations including rheumatology, vascular surgery, hematology. Likely early atherosclerotic disease and recommended baby aspirin and agree with starting a statin (see ASCVD risk, below).   - Outpatient follow-up in hand clinic as scheduled   - bandage changes daily   - Nitroglycerin cream prn pain   ??  Tinea pedis??and onychomycosis: Trichophyton confirmed on biopsy.   - Griseofulvin 375mg  BID x 6 months total (9/16 - )  - Continue econazole cream to bilateral feet BID  ??  ASCVD Risk: Pooled Cohort 10-yr risk is 3%, however, HIV is an independent risk factor for CVD which is not included in the risk score. Pt has atherosclerotic disease in his lower extremities and dyslipidemia.   - Atorvastatin 20mg  daily  - Aspirin 81mg  daily   ??  COVID-19 infection (resolved)  - Asymptomatic. Completed 5 days remdesivir.??  - Date of positive SARS-CoV-2 PCR: 12/09/2018 at OSH.   - Patient was 21 days from positive PCR on 9/9 and precautions discontinued. ????  ??  Insomnia  - Melatonin 3mg  qhs  ??  FEN/PPX:  - Diet: Regular   - Fluids: Encourage PO hydration.  - Electrolytes: Monitor and replete PRN.  - DVT ppx: Lovenox.  - GI ppx: Not indicated at this time.    SUBJECTIVE:     Interval History: NAEO. SRO evaluated hand today, healing well. Rescheduled hand and ID follow up appointments. Nitropaste ordered as needed for hand pain. Patient reports hand is not hurting as much today. Had multiple bowel accidetns yesterday. Will monitor and make changes as needed. Self-cathing going well.     OBJECTIVE:     Vital signs (last 24 hours):   Temp:  [36.7 ??C] 36.7 ??C  Heart Rate:  [78-82] 78  Resp:  [16-18] 18  BP: (121-128)/(79-86) 121/86  MAP (mmHg):  [93-97] 97  SpO2:  [100 %] 100 %    Intake/Output: I/O last 3 completed shifts:  In: -   Out: 1678 [Urine:1675; Stool:3]    Physical Exam:    GEN: Middle aged male, sitting upright in WC, no acute distress  HEENT: Normcephalic, sclera anicteric   Cardio: Warm and well perfused, no lower extremity edema noted  Resp: Normal work of breathing on room air  Abdomen: non distended   GU: No foley catheter  Neuro: Awake and Alert. Flaccid BLE.   Psych: Calm and cooperative    Labs: I have reviewed the labs and studies from the last 24hrs.    Imaging: reviewed.     Sueanne Margarita, D.O., PGY-2  [facility] Physical Medicine & Rehabilitation  Between 8am-5pm Monday-Friday, please page [phone]. After hours, please page PM&R 1st call.

## 2019-02-12 NOTE — Unmapped (Signed)
Pt awake alert and oriented. Pt I&O cath self with set up. Pt has bowel program at night. Set up with meals. No complaints of pain.  PRAFO boot bilateral feet while in bed.  Call bell within reach. Bed low and locked.

## 2019-02-12 NOTE — Unmapped (Signed)
Pt self I&O cath every 6 hours. Bowel program nightly. Set up with meals. Using slide board for transfers. No complaints of pain.

## 2019-02-12 NOTE — Unmapped (Signed)
Alert and oriented. Continue I/O cath every 6 hours. Denies pain/discomfort. Dressing changed on his left finger 3rd digit with xeroform gauze and small kerlix wrap. No drainage noted. Call bell placed within easy reach. Will continue to monitor.     Problem: Rehabilitation (IRF) Plan of Care  Goal: Plan of Care Review  Outcome: Progressing  Goal: Patient-Specific Goal (Individualization)  Outcome: Progressing  Goal: Absence of Hospital-Acquired Illness or Injury  Outcome: Progressing  Goal: Home Safety Plan Established  Outcome: Progressing  Goal: Demonstration of Effective Coping Strategies  Outcome: Progressing  Goal: Community Reintegration Plan Established  Outcome: Progressing     Problem: Fall Injury Risk  Goal: Absence of Fall and Fall-Related Injury  Outcome: Progressing     Problem: Wound  Goal: Optimal Wound Healing  Outcome: Progressing

## 2019-02-12 NOTE — Unmapped (Signed)
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   - SLP for cognitive and swallow function.  - Neuropsych for higher level cognitive evaluation and coping.  - RT for community re-integration, education, and leisure support services.  - P&O for assistive devices PRN.  - Pharmacy consult for patient and family education on medication management.   - Nutrition consult for diet information/teaching.   - To be discussed in weekly Interdisciplinary Team Conference.  ??  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 Workup significant for MRI showing increased T2 signal in the spinal cord from the level of T10 to the conus medullaris primarily involving the gray matter tracts with focal T2 hyperintensity in the dorsal columns at T10.   ?? Pain Control  ? Tylenol for mild, Tramadol for mod/severe  ?? Neurogenic Bladder  ? I/O cath q6H (changed 10/7)   ? Maintain cath volumes <500 cc   ?? Neurogenic Bowel: UMN Bowel  ? Admission KUB with moderate stool burden: s/p cleanout with Mag Citrate   ? 3:2:1 (Colace, Senna, Dulcolax suppository in evening + dig stim) up to [facility]   ? Miralax daily d/c'd 10/13   ?? Spasticity  ? No complaints of spasms at this time.  ?? Equipment/Support  ? PRAFO boots to bilateral feet while in bed to prevent ankle contractures ?? DVT Prophylaxis  ? DVT ppx with Lovenox x 8 weeks (8 weeks = 11/27)   ?? Skin  ? Turn patient Q2H to prevent pressure injuries  ? Maintain nursing skin integrity protocol with daily skin checks  ?? Prognostication/Counseling  ? ASIA Exam to be considered   ? Education regarding patient's diagnosis, anticipated recovery, reintegration to the community, and fertility to be completed  ??  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 MRI spine notable for multiple spinal cord lesions (increased T2??signal from T10??to conus medullaris). MRI brain with??single enhancing??brain lesion to the right??corona radiata. Per radiology,??spinal cord findings most consistent with toxoplasmosis, brain lesion less consistent (not rim enhancing). LP 8/22 notable for elevated cells, lymphocytes, and protein, toxoplasmosis and EBV positive, negative cytology. Sulfadiazine, pyrimethamine, and leucovorin started 8/24 for toxoplasmosis.??Repeat LP 8/25,??again??negative for malignant cells.??CMV+ in the blood but not CSF.??He received??ganciclovir 8/22-8/25.????Was initially switched to Bactrim (10/5-10/6) for maintenance notes per ID recs, but switched to sulfadiazine/pyrimethamine (10/7 - ) due to concerns from pharmacy.  - S/p induction therapy with Sulfadiazine,  pyrimethamine, and leucovorin for 6 weeks (8/24 - 10/5)  - Sulfadiazine/Pyrimethamine/Leuovorin (maintenance dose) (10/7 - )  ??  HIV/AIDS: Last CD4 = 231/14%. Diagnosed in 2007 and was treatment naive on admission (had previously declined ART due to religious reasons). Started Tivacay/Descovy 8/26.??  - Plan for switch to [facility] on discharge.  - Dr. Dolores Frame has initiated process to coordinate outpatient follow-up and drug assistance enrollment.  -  Will need bactrim prophylaxis for PJP once therapy for Toxo completed (hold for now)  ????????????????????  Dry gangrene L hand Etiology remains unclear, likely multifactorial. Bilateral UE arterial PVLs without any HD-significant findings and blood cultures obtained in the ED were negative. Has had extensive work up including multispecialty consultations including rheumatology, vascular surgery, hematology. Likely early atherosclerotic disease and recommended baby aspirin and agree with starting a statin (see ASCVD risk, below).   - Outpatient follow-up in hand clinic as scheduled   - bandage changes daily   - Nitroglycerin cream prn pain   ??  Tinea pedis??and onychomycosis: Trichophyton confirmed on biopsy.   - Griseofulvin 375mg  BID x 6 months total (9/16 - )  - Continue econazole cream to bilateral feet BID  - nail care   ??  ASCVD Risk: Pooled Cohort 10-yr risk is 3%, however, HIV is an independent risk factor for CVD which is not included in the risk score. Pt has atherosclerotic disease in his lower extremities and dyslipidemia.   - Atorvastatin 20mg  daily  - Aspirin 81mg  daily   ??  Insomnia  - Melatonin 3mg  qhs  ??  FEN/PPX:  - Diet: Regular   - Fluids: Encourage PO hydration.  - Electrolytes: Monitor and replete PRN.  - DVT ppx: Lovenox.  - GI ppx: Not indicated at this time.    SUBJECTIVE:     Interval History: NAEO. Seen during therapy. No bowel incontinence yesterday. Doing well. No complaints.     OBJECTIVE:     Vital signs (last 24 hours):   Temp:  [36.7 ??C-36.8 ??C] 36.7 ??C  Heart Rate:  [83-100] 83  Resp:  [17] 17  BP: (118-143)/(83-85) 118/85  MAP (mmHg):  [96-102] 96  SpO2:  [99 %-100 %] 100 %    Intake/Output:  I/O last 3 completed shifts:  In: -   Out: 2100 [Urine:2100]    Physical Exam:    GEN: Middle aged male, no acute distress  HEENT: Normcephalic, sclera anicteric   Cardio: Warm and well perfused, no lower extremity edema noted  Resp: Normal work of breathing on room air  Abdomen: non distended   GU: No foley catheter  Neuro: Awake and Alert. Flaccid BLE.   Psych: Calm and cooperative Labs: I have reviewed the labs and studies from the last 24hrs.    Imaging: reviewed.     Sueanne Margarita, D.O., PGY-2  [facility] Physical Medicine & Rehabilitation  Between 8am-5pm Monday-Friday, please page [phone]. After hours, please page PM&R 1st call.

## 2019-02-13 NOTE — Unmapped (Signed)
Did I/O self cath at 12 MN. Denies pain/discomfort. Continue xeroform gauze dressing on his 3rd digit on his left finger. No new evidence of skin breakdown. Econazole cream applied on his feet.    Problem: Rehabilitation (IRF) Plan of Care  Goal: Plan of Care Review  Outcome: Progressing  Goal: Patient-Specific Goal (Individualization)  Outcome: Progressing  Goal: Absence of Hospital-Acquired Illness or Injury  Outcome: Progressing  Goal: Home Safety Plan Established  Outcome: Progressing  Goal: Demonstration of Effective Coping Strategies  Outcome: Progressing  Goal: Community Reintegration Plan Established  Outcome: Progressing     Problem: Fall Injury Risk  Goal: Absence of Fall and Fall-Related Injury  Outcome: Progressing     Problem: Wound  Goal: Optimal Wound Healing  Outcome: Progressing

## 2019-02-13 NOTE — Unmapped (Signed)
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   - SLP for cognitive and swallow function.  - Neuropsych for higher level cognitive evaluation and coping.  - RT for community re-integration, education, and leisure support services.  - P&O for assistive devices PRN.  - Pharmacy consult for patient and family education on medication management.   - Nutrition consult for diet information/teaching.   - To be discussed in weekly Interdisciplinary Team Conference.  ??  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 Workup significant for MRI showing increased T2 signal in the spinal cord from the level of T10 to the conus medullaris primarily involving the gray matter tracts with focal T2 hyperintensity in the dorsal columns at T10.   ?? Pain Control  ? Tylenol for mild, Tramadol for mod/severe  ?? Neurogenic Bladder  ? I/O cath q6H (changed 10/7)   ? Maintain cath volumes <500 cc   ?? Neurogenic Bowel: UMN Bowel  ? Admission KUB with moderate stool burden: s/p cleanout with Mag Citrate   ? 3:2:1 (Colace, Senna, Dulcolax suppository in evening + dig stim) up to [facility]   ? Miralax daily d/c'd 10/13   ? Cleanout with mag citrate + SMOG enema 10/24  ?? Spasticity  ? No complaints of spasms at this time.  ?? Equipment/Support ? PRAFO boots to bilateral feet while in bed to prevent ankle contractures  ?? DVT Prophylaxis  ? DVT ppx with Lovenox x 8 weeks (8 weeks = 11/27)   ?? Skin  ? Turn patient Q2H to prevent pressure injuries  ? Maintain nursing skin integrity protocol with daily skin checks  ?? Prognostication/Counseling  ? ASIA Exam to be considered   ? Education regarding patient's diagnosis, anticipated recovery, reintegration to the community, and fertility to be completed  ??  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 MRI spine notable for multiple spinal cord lesions (increased T2??signal from T10??to conus medullaris). MRI brain with??single enhancing??brain lesion to the right??corona radiata. Per radiology,??spinal cord findings most consistent with toxoplasmosis, brain lesion less consistent (not rim enhancing). LP 8/22 notable for elevated cells, lymphocytes, and protein, toxoplasmosis and EBV positive, negative cytology. Sulfadiazine, pyrimethamine, and leucovorin started 8/24 for toxoplasmosis.??Repeat LP 8/25,??again??negative for malignant cells.??CMV+ in the blood but not CSF.??He received??ganciclovir 8/22-8/25.????Was initially switched to Bactrim (10/5-10/6) for maintenance notes per ID recs, but switched to sulfadiazine/pyrimethamine (10/7 - ) due to concerns from pharmacy.  - S/p induction therapy with Sulfadiazine,  pyrimethamine, and leucovorin for 6 weeks (8/24 - 10/5)  - Sulfadiazine/Pyrimethamine/Leuovorin (maintenance dose) (10/7 - )  ??  HIV/AIDS: Last CD4 = 231/14%. Diagnosed in 2007 and was treatment naive on admission (had previously declined ART due to religious reasons). Started Tivacay/Descovy 8/26.??  - Plan for switch to [facility] on discharge.  - Dr. Dolores Frame has initiated  process to coordinate outpatient follow-up and drug assistance enrollment.  - Will need bactrim prophylaxis for PJP once therapy for Toxo completed (hold for now) Dry gangrene L hand   Etiology remains unclear, likely multifactorial. Bilateral UE arterial PVLs without any HD-significant findings and blood cultures obtained in the ED were negative. Has had extensive work up including multispecialty consultations including rheumatology, vascular surgery, hematology. Likely early atherosclerotic disease and recommended baby aspirin and agree with starting a statin (see ASCVD risk, below).   - Outpatient follow-up in hand clinic as scheduled   - Bandage changes daily   - Nitroglycerin cream prn pain   ??  Tinea pedis??and onychomycosis: Trichophyton confirmed on biopsy.   - Griseofulvin 375mg  BID x 6 months total (9/16 - )  - Continue econazole cream to bilateral feet BID  - nail care   ??  ASCVD Risk: Pooled Cohort 10-yr risk is 3%, however, HIV is an independent risk factor for CVD which is not included in the risk score. Pt has atherosclerotic disease in his lower extremities and dyslipidemia.   - Atorvastatin 20mg  daily  - Aspirin 81mg  daily   ??  Insomnia  - Melatonin 3mg  qhs  ??  FEN/PPX:  - Diet: Regular   - Fluids: Encourage PO hydration.  - Electrolytes: Monitor and replete PRN.  - DVT ppx: Lovenox.  - GI ppx: Not indicated at this time.    SUBJECTIVE:     Interval History: NAEO. Reported bowel incontinence again today. Will do bowel cleanout with mag citrate and SMOG today and resume bowel program. No complaints of pain.     OBJECTIVE:     Vital signs (last 24 hours):   Temp:  [36.3 ??C-36.4 ??C] 36.3 ??C  Heart Rate:  [87-95] 87  Resp:  [18] 18  BP: (116-129)/(80-91) 129/91  MAP (mmHg):  [103] 103  SpO2:  [95 %-99 %] 99 %    Intake/Output:  I/O last 3 completed shifts:  In: -   Out: 2300 [Urine:2300]    Physical Exam:    GEN: Middle aged male, no acute distress  HEENT: Normcephalic, sclera anicteric   Cardio: Warm and well perfused, no lower extremity edema noted  Resp: Normal work of breathing on room air  Abdomen: non distended   Neuro: Awake and Alert. Flaccid BLE. Psych: Calm and cooperative    Labs: I have reviewed the labs and studies from the last 24hrs.    Imaging: reviewed.     Sueanne Margarita, D.O., PGY-2  [facility] Physical Medicine & Rehabilitation  Between 8am-5pm Monday-Friday, please page [phone]. After hours, please page PM&R 1st call.

## 2019-02-14 NOTE — Unmapped (Signed)
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   - SLP for cognitive and swallow function.  - Neuropsych for higher level cognitive evaluation and coping.  - RT for community re-integration, education, and leisure support services.  - P&O for assistive devices PRN.  - Pharmacy consult for patient and family education on medication management.   - Nutrition consult for diet information/teaching.   - To be discussed in weekly Interdisciplinary Team Conference.  ??  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 Workup significant for MRI showing increased T2 signal in the spinal cord from the level of T10 to the conus medullaris primarily involving the gray matter tracts with focal T2 hyperintensity in the dorsal columns at T10.   ?? Pain Control  ? Tylenol for mild, Tramadol for mod/severe  ?? Neurogenic Bladder  ? I/O cath q6H (changed 10/7)   ? Maintain cath volumes <500 cc   ?? Neurogenic Bowel: UMN Bowel  ? Admission KUB with moderate stool burden: s/p cleanout with Mag Citrate   ? 3:2:1 (Colace, Senna, Dulcolax suppository in evening + dig stim) up to [facility]   ? Miralax daily d/c'd 10/13   ? Cleanout with mag citrate + SMOG enema 10/24  ?? Spasticity  ? No complaints of spasms at this time.  ?? Equipment/Support ? PRAFO boots to bilateral feet while in bed to prevent ankle contractures  ?? DVT Prophylaxis  ? DVT ppx with Lovenox x 8 weeks (8 weeks = 11/27)   ?? Skin  ? Turn patient Q2H to prevent pressure injuries  ? Maintain nursing skin integrity protocol with daily skin checks  ??  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 MRI spine notable for multiple spinal cord lesions (increased T2??signal from T10??to conus medullaris). MRI brain with??single enhancing??brain lesion to the right??corona radiata. Per radiology,??spinal cord findings most consistent with toxoplasmosis, brain lesion less consistent (not rim enhancing). LP 8/22 notable for elevated cells, lymphocytes, and protein, toxoplasmosis and EBV positive, negative cytology. Sulfadiazine, pyrimethamine, and leucovorin started 8/24 for toxoplasmosis.??Repeat LP 8/25,??again??negative for malignant cells.??CMV+ in the blood but not CSF.??He received??ganciclovir 8/22-8/25.????Was initially switched to Bactrim (10/5-10/6) for maintenance notes per ID recs, but switched to sulfadiazine/pyrimethamine (10/7 - ) due to concerns from pharmacy.  - S/p induction therapy with Sulfadiazine,  pyrimethamine, and leucovorin for 6 weeks (8/24 - 10/5)  - Sulfadiazine/Pyrimethamine/Leuovorin (maintenance dose) (10/7 - )  ??  HIV/AIDS: Last CD4 = 231/14%. Diagnosed in 2007 and was treatment naive on admission (had previously declined ART due to religious reasons). Started Tivacay/Descovy 8/26.??  - Plan for switch to [facility] on discharge.  - Dr. Dolores Frame has initiated process to coordinate outpatient follow-up and drug assistance enrollment.  - Will need bactrim prophylaxis for PJP once therapy for Toxo completed (hold for now)  ????????????????????  Dry gangrene L hand Etiology remains unclear, likely multifactorial. Bilateral UE arterial PVLs without any HD-significant findings and blood cultures obtained in the ED were negative. Has had extensive work up including multispecialty consultations including rheumatology, vascular surgery, hematology. Likely early atherosclerotic disease and recommended baby aspirin and agree with starting a statin (see ASCVD risk, below).   - Outpatient follow-up in hand clinic as scheduled   - Bandage changes daily   - Nitroglycerin cream prn pain   ??  Tinea pedis??and onychomycosis: Trichophyton confirmed on biopsy.   - Griseofulvin 375mg  BID x 6 months total (9/16 - )  - Continue econazole cream to bilateral feet BID  - nail care   ??  ASCVD Risk: Pooled Cohort 10-yr risk is 3%, however, HIV is an independent risk factor for CVD which is not included in the risk score. Pt has atherosclerotic disease in his lower extremities and dyslipidemia.   - Atorvastatin 20mg  daily  - Aspirin 81mg  daily   ??  Insomnia  - Melatonin 3mg  qhs  ??  FEN/PPX:  - Diet: Regular   - Fluids: Encourage PO hydration.  - Electrolytes: Monitor and replete PRN.  - DVT ppx: Lovenox.  - GI ppx: Not indicated at this time.    SUBJECTIVE:     Interval History: NAEO.  Doing well this morning.  Reports good output from cleanout yesterday.  We will continue with current bowel program monitor for episodes of incontinence.  Cath lines remain appropriate.  Denies any pain.    OBJECTIVE:     Vital signs (last 24 hours):   Temp:  [36.7 ??C] 36.7 ??C  Heart Rate:  [86-100] 86  Resp:  [18] 18  BP: (123-127)/(77-89) 123/77  MAP (mmHg):  [91-100] 91  SpO2:  [99 %] 99 %    Intake/Output:  I/O last 3 completed shifts:  In: -   Out: 2150 [Urine:2150]    Physical Exam:    GEN: Middle aged male, no acute distress  HEENT: Normcephalic, sclera anicteric   Cardio: Warm and well perfused, no lower extremity edema noted  Resp: Normal work of breathing on room air  Abdomen: non distended Neuro: Awake and Alert. Flaccid BLE.   Psych: Calm and cooperative    Labs: I have reviewed the labs and studies from the last 24hrs.    Imaging: reviewed.     Sueanne Margarita, D.O., PGY-2  [facility] Physical Medicine & Rehabilitation  Between 8am-5pm Monday-Friday, please page [phone]. After hours, please page PM&R 1st call.

## 2019-02-14 NOTE — Unmapped (Signed)
AA&O x4. Independent with meals. No c/o pain. I&O cath done by patient with set up q6h. Incontinent mod amt hard formed stool this am, one bottle mag citrate given today  and will get SMOG enema this evening. DVT,PE, falls and pressure ulcer prevention protocols observed. Safety precautions maintained with bed in low position, wheels locked and call bell in reach. Will continue to monitor.

## 2019-02-15 LAB — CBC
HEMATOCRIT: 39.4 % — ABNORMAL LOW (ref 41.0–53.0)
HEMOGLOBIN: 12.6 g/dL — ABNORMAL LOW (ref 13.5–17.5)
MEAN CORPUSCULAR HEMOGLOBIN CONC: 32.1 g/dL (ref 31.0–37.0)
MEAN CORPUSCULAR HEMOGLOBIN: 28.5 pg (ref 26.0–34.0)
MEAN CORPUSCULAR VOLUME: 88.8 fL (ref 80.0–100.0)
PLATELET COUNT: 267 10*9/L (ref 150–440)
RED BLOOD CELL COUNT: 4.43 10*12/L — ABNORMAL LOW (ref 4.50–5.90)
RED CELL DISTRIBUTION WIDTH: 16.9 % — ABNORMAL HIGH (ref 12.0–15.0)

## 2019-02-15 LAB — COMPREHENSIVE METABOLIC PANEL
ALBUMIN: 4.2 g/dL (ref 3.5–5.0)
ALKALINE PHOSPHATASE: 91 U/L (ref 38–126)
ALT (SGPT): 23 U/L (ref <50)
AST (SGOT): 25 U/L (ref 19–55)
BILIRUBIN TOTAL: 0.4 mg/dL (ref 0.0–1.2)
BLOOD UREA NITROGEN: 11 mg/dL (ref 7–21)
BUN / CREAT RATIO: 18
CALCIUM: 9.5 mg/dL (ref 8.5–10.2)
CHLORIDE: 102 mmol/L (ref 98–107)
CO2: 32 mmol/L — ABNORMAL HIGH (ref 22.0–30.0)
CREATININE: 0.62 mg/dL — ABNORMAL LOW (ref 0.70–1.30)
EGFR CKD-EPI AA MALE: >90 mL/min/{1.73_m2} (ref >=60)
EGFR CKD-EPI NON-AA MALE: >90 mL/min/{1.73_m2} (ref >=60)
GLUCOSE RANDOM: 132 mg/dL (ref 70–179)
POTASSIUM: 4.5 mmol/L (ref 3.5–5.0)
PROTEIN TOTAL: 7.7 g/dL (ref 6.5–8.3)

## 2019-02-15 LAB — EGFR CKD-EPI NON-AA MALE: Lab: >90

## 2019-02-15 LAB — HEMOGLOBIN: Hemoglobin:MCnc:Pt:Bld:Qn:: 12.6 — ABNORMAL LOW

## 2019-02-15 NOTE — Unmapped (Signed)
AA&O x3. Independent with meals. No c/o pain. Performs self cath q6h. Boel program in evening. Drsg to left  finger changed. No drainage from site. DVT,PE, falls and pressure ulcer prevention protocols observed. Safety precautions maintained with bed in low position, wheels locked and call bell in reach. Will continue to monitor.

## 2019-02-15 NOTE — Unmapped (Signed)
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   - SLP for cognitive and swallow function.  - Neuropsych for higher level cognitive evaluation and coping.  - RT for community re-integration, education, and leisure support services.  - P&O for assistive devices PRN.  - Pharmacy consult for patient and family education on medication management.   - Nutrition consult for diet information/teaching.   - To be discussed in weekly Interdisciplinary Team Conference.  ??  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 Workup significant for MRI showing increased T2 signal in the spinal cord from the level of T10 to the conus medullaris primarily involving the gray matter tracts with focal T2 hyperintensity in the dorsal columns at T10.   ?? Pain Control  ? Tylenol for mild, Tramadol for mod/severe  ?? Neurogenic Bladder  ? I/O cath q6H (changed 10/7)   ? Maintain cath volumes <500 cc   ?? Neurogenic Bowel: UMN Bowel  ? 3:2:1 (Colace, Senna, Dulcolax suppository in evening + dig stim) up to [facility]   ? Cleanout with mag citrate + SMOG enema 10/24  ?? Spasticity  ? No complaints of spasms at this time.  ?? Equipment/Support  ? PRAFO boots to bilateral feet while in bed to prevent ankle contractures Wheelchair statement: The patient has mobility limitations that interfere with performing ADL???s and MADL???s and a wheelchair would greatly improve this. 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 Patient expresses a willingness to use a wheelchair and he has adequate space in their living environment for a wheelchair.   ?? DVT Prophylaxis  ? DVT ppx with Lovenox x 8 weeks (8 weeks = 11/27)   ?? Skin  ? Turn patient Q2H to prevent pressure injuries  ? Maintain nursing skin integrity protocol with daily skin checks  ??  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 MRI spine notable for multiple spinal cord lesions (increased T2??signal from T10??to conus medullaris). MRI brain with??single enhancing??brain lesion to the right??corona radiata. Per radiology,??spinal cord findings most consistent with toxoplasmosis, brain lesion less consistent (not rim enhancing). LP 8/22 notable for elevated cells, lymphocytes, and protein, toxoplasmosis and EBV positive, negative cytology. Sulfadiazine, pyrimethamine, and leucovorin started 8/24 for toxoplasmosis.??Repeat LP 8/25,??again??negative for malignant cells.??CMV+ in the blood but not CSF.??He received??ganciclovir 8/22-8/25.????Was initially switched to Bactrim (10/5-10/6) for maintenance notes per ID recs, but switched to sulfadiazine/pyrimethamine (10/7 - ) due to concerns from pharmacy.  - S/p induction therapy with Sulfadiazine,  pyrimethamine, and leucovorin for 6 weeks (8/24 - 10/5)  - Sulfadiazine/Pyrimethamine/Leuovorin (maintenance dose) (10/7 - )  ??  HIV/AIDS: Last CD4 = 231/14%. Diagnosed in 2007 and was treatment naive on admission (had previously declined ART due to religious reasons). Started Tivacay/Descovy 8/26.??  -  Plan for switch to [facility] on discharge. - Dr. Dolores Frame has initiated process to coordinate outpatient follow-up and drug assistance enrollment.  - Will need bactrim prophylaxis for PJP once therapy for Toxo completed (hold for now)  ????????????????????  Dry gangrene L hand   Etiology remains unclear, likely multifactorial. Bilateral UE arterial PVLs without any HD-significant findings and blood cultures obtained in the ED were negative. Has had extensive work up including multispecialty consultations including rheumatology, vascular surgery, hematology. Likely early atherosclerotic disease and recommended baby aspirin and agree with starting a statin (see ASCVD risk, below).   - Outpatient follow-up in hand clinic as scheduled   - Bandage changes daily   - Nitroglycerin cream prn pain   ??  Tinea pedis??and onychomycosis: Trichophyton confirmed on biopsy.   - Griseofulvin 375mg  BID x 6 months total (9/16 - )  - Continue econazole cream to bilateral feet BID  - nail care   ??  ASCVD Risk: Pooled Cohort 10-yr risk is 3%, however, HIV is an independent risk factor for CVD which is not included in the risk score. Pt has atherosclerotic disease in his lower extremities and dyslipidemia.   - Atorvastatin 20mg  daily  - Aspirin 81mg  daily   ??  Insomnia  - Melatonin 3mg  qhs  ??  FEN/PPX:  - Diet: Regular   - Fluids: Encourage PO hydration.  - Electrolytes: Monitor and replete PRN.  - DVT ppx: Lovenox.  - GI ppx: Not indicated at this time.    SUBJECTIVE:     Interval History: NAEO.  Seen during therapy. Good result with bowel program this weekend. No accidents yet today per patient. Cath volumes remain appropriate. Continues to do well. Looking forward to going home this week.     OBJECTIVE:     Vital signs (last 24 hours):   Temp:  [36.7 ??C-37 ??C] 37 ??C  Heart Rate:  [77-84] 77  Resp:  [18] 18  BP: (120-133)/(80-85) 133/85  MAP (mmHg):  [92-98] 98  SpO2:  [100 %] 100 %    Intake/Output:  I/O last 3 completed shifts:  In: -   Out: 2325 [Urine:2325]    Physical Exam: GEN: Middle aged male, no acute distress  HEENT: Normcephalic, sclera anicteric   Cardio: Warm and well perfused, no lower extremity edema noted  Resp: Normal work of breathing on room air  Abdomen: non distended   Neuro: Awake and Alert. Flaccid BLE.   Psych: Calm and cooperative    Labs: I have reviewed the labs and studies from the last 24hrs.    Imaging: reviewed.     Sueanne Margarita, D.O., PGY-2  [facility] Physical Medicine & Rehabilitation  Between 8am-5pm Monday-Friday, please page [phone]. After hours, please page PM&R 1st call.

## 2019-02-15 NOTE — Unmapped (Signed)
A&O x 4. Calm, pleasant, cooperative. Denies pain. Food and fluids well tolerated. Q6H I/O cath as charted. Dressing in tact. precautions in situ. CTM.     Problem: Rehabilitation (IRF) Plan of Care  Goal: Plan of Care Review  Outcome: Progressing  Goal: Patient-Specific Goal (Individualization)  Outcome: Progressing  Goal: Absence of Hospital-Acquired Illness or Injury  Outcome: Progressing  Goal: Home Safety Plan Established  Outcome: Progressing  Goal: Demonstration of Effective Coping Strategies  Outcome: Progressing     Problem: Fall Injury Risk  Goal: Absence of Fall and Fall-Related Injury  Outcome: Progressing     Problem: Wound  Goal: Optimal Wound Healing  Outcome: Progressing     Problem: Skin Injury Risk Increased  Goal: Skin Health and Integrity  Outcome: Progressing     Problem: Self-Care Deficit  Goal: Improved Ability to Complete Activities of Daily Living  Outcome: Progressing     Problem: Sensory Impairment  Goal: Sensory Impairment Goal: Effective Self-Management of Sensory Impairment  Outcome: Progressing     Problem: Tissue Perfusion Altered  Goal: Improved Tissue Perfusion  Outcome: Progressing     Problem: Adjustment to Injury (Spinal Cord Injury)  Goal: Optimal Coping with Life-Changing Injury  Outcome: Progressing     Problem: Bowel Elimination Impaired (Spinal Cord Injury)  Goal: Effective Bowel Elimination  Outcome: Progressing     Problem: Embolism (Spinal Cord Injury)  Goal: Absence of Embolism Signs/Symptoms  Outcome: Progressing     Problem: Extended Neurologic Impairment (Spinal Cord Injury)  Goal: Absence of Extended Neurologic Injury  Outcome: Progressing     Problem: Functional Ability Impaired (Spinal Cord Injury)  Goal: Optimal Functional Ability  Outcome: Progressing     Problem: Hemodynamic Instability (Spinal Cord Injury)  Goal: Hemodynamic Stability Maintained  Outcome: Progressing     Problem: Neurogenic Shock (Spinal Cord Injury)  Goal: Effective Tissue Perfusion Outcome: Progressing     Problem: Nutrition Impaired (Spinal Cord Injury)  Goal: Optimal Nutrition Intake  Outcome: Progressing     Problem: Orthostatic Hypotension (Spinal Cord Injury)  Goal: Absence of Postural Hypotension  Outcome: Progressing     Problem: Pain (Spinal Cord Injury)  Goal: Acceptable Pain Control  Outcome: Progressing     Problem: Respiratory Compromise (Spinal Cord Injury)  Goal: Effective Oxygenation and Ventilation  Outcome: Progressing     Problem: Sensorimotor Impairment (Spinal Cord Injury)  Goal: Optimal Sensorimotor Function  Outcome: Progressing     Problem: Skin Injury (Spinal Cord Injury)  Goal: Skin Health and Integrity  Outcome: Progressing     Problem: Thermoregulation Alteration (Spinal Cord Injury)  Goal: Body Temperature Remains in Desired Range  Outcome: Progressing     Problem: Urinary Elimination Impaired (Spinal Cord Injury)  Goal: Effective Urinary Elimination  Outcome: Progressing     Problem: Mobility Impairment  Goal: Optimal Mobility  Outcome: Progressing     Problem: Infection  Goal: Infection Symptom Resolution  Outcome: Progressing     Problem: Venous Thromboembolism  Goal: VTE (Venous Thromboembolism) Symptom Resolution  Outcome: Progressing     Problem: Adjustment to Surgery (Extremity Amputation)  Goal: Optimal Coping with Amputation  Outcome: Progressing     Problem: Bleeding (Extremity Amputation)  Goal: Absence of Bleeding  Outcome: Progressing     Problem: Functional Ability Impaired (Extremity Amputation)  Goal: Optimal Functional Ability  Outcome: Progressing     Problem: Pain (Extremity Amputation)  Goal: Acceptable Pain Control  Outcome: Progressing     Problem: Residual Limb Care (Extremity Amputation)  Goal: Optimal Residual  Limb Healing  Outcome: Progressing

## 2019-02-15 NOTE — Unmapped (Addendum)
Pt is A & O x 4, does own intermittent I & O cath after setup,  bowel program in the evening, denies pain, c/o nausea and Zofran provided relief, dressing on left third finger changed everyday by nursing, fall and skin care precautions maintained, call bell in reach, will continue to monitor.    Problem: Rehabilitation (IRF) Plan of Care  Goal: Plan of Care Review  Outcome: Progressing  Goal: Patient-Specific Goal (Individualization)  Outcome: Progressing  Goal: Absence of Hospital-Acquired Illness or Injury  Outcome: Progressing  Goal: Home Safety Plan Established  Outcome: Progressing  Goal: Demonstration of Effective Coping Strategies  Outcome: Progressing  Goal: Community Reintegration Plan Established  Outcome: Progressing     Problem: Fall Injury Risk  Goal: Absence of Fall and Fall-Related Injury  Outcome: Progressing     Problem: Wound  Goal: Optimal Wound Healing  Outcome: Progressing     Problem: Skin Injury Risk Increased  Goal: Skin Health and Integrity  Outcome: Progressing     Problem: Self-Care Deficit  Goal: Improved Ability to Complete Activities of Daily Living  Outcome: Progressing     Problem: Sensory Impairment  Goal: Sensory Impairment Goal: Effective Self-Management of Sensory Impairment  Outcome: Progressing     Problem: Tissue Perfusion Altered  Goal: Improved Tissue Perfusion  Outcome: Progressing     Problem: Adjustment to Injury (Spinal Cord Injury)  Goal: Optimal Coping with Life-Changing Injury  Outcome: Progressing     Problem: Bowel Elimination Impaired (Spinal Cord Injury)  Goal: Effective Bowel Elimination  Outcome: Progressing     Problem: Embolism (Spinal Cord Injury)  Goal: Absence of Embolism Signs/Symptoms  Outcome: Progressing     Problem: Extended Neurologic Impairment (Spinal Cord Injury)  Goal: Absence of Extended Neurologic Injury  Outcome: Progressing     Problem: Functional Ability Impaired (Spinal Cord Injury)  Goal: Optimal Functional Ability  Outcome: Progressing Problem: Hemodynamic Instability (Spinal Cord Injury)  Goal: Hemodynamic Stability Maintained  Outcome: Progressing     Problem: Neurogenic Shock (Spinal Cord Injury)  Goal: Effective Tissue Perfusion  Outcome: Progressing     Problem: Nutrition Impaired (Spinal Cord Injury)  Goal: Optimal Nutrition Intake  Outcome: Progressing     Problem: Orthostatic Hypotension (Spinal Cord Injury)  Goal: Absence of Postural Hypotension  Outcome: Progressing     Problem: Pain (Spinal Cord Injury)  Goal: Acceptable Pain Control  Outcome: Progressing     Problem: Respiratory Compromise (Spinal Cord Injury)  Goal: Effective Oxygenation and Ventilation  Outcome: Progressing     Problem: Sensorimotor Impairment (Spinal Cord Injury)  Goal: Optimal Sensorimotor Function  Outcome: Progressing     Problem: Skin Injury (Spinal Cord Injury)  Goal: Skin Health and Integrity  Outcome: Progressing     Problem: Thermoregulation Alteration (Spinal Cord Injury)  Goal: Body Temperature Remains in Desired Range  Outcome: Progressing     Problem: Urinary Elimination Impaired (Spinal Cord Injury)  Goal: Effective Urinary Elimination  Outcome: Progressing     Problem: Mobility Impairment  Goal: Optimal Mobility  Outcome: Progressing     Problem: Infection  Goal: Infection Symptom Resolution  Outcome: Progressing     Problem: Venous Thromboembolism  Goal: VTE (Venous Thromboembolism) Symptom Resolution  Outcome: Progressing     Problem: Adjustment to Surgery (Extremity Amputation)  Goal: Optimal Coping with Amputation  Outcome: Progressing     Problem: Bleeding (Extremity Amputation)  Goal: Absence of Bleeding  Outcome: Progressing     Problem: Functional Ability Impaired (Extremity Amputation)  Goal: Optimal Functional Ability  Outcome: Progressing     Problem: Pain (Extremity Amputation)  Goal: Acceptable Pain Control  Outcome: Progressing     Problem: Residual Limb Care (Extremity Amputation)  Goal: Optimal Residual Limb Healing Outcome: Progressing

## 2019-02-16 NOTE — Unmapped (Signed)
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   - SLP for cognitive and swallow function.  - Neuropsych for higher level cognitive evaluation and coping.  - RT for community re-integration, education, and leisure support services.  - P&O for assistive devices PRN.  - Pharmacy consult for patient and family education on medication management.   - Nutrition consult for diet information/teaching.   - To be discussed in weekly Interdisciplinary Team Conference.  ??  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 Workup significant for MRI showing increased T2 signal in the spinal cord from the level of T10 to the conus medullaris primarily involving the gray matter tracts with focal T2 hyperintensity in the dorsal columns at T10.   ?? Pain Control  ? Tylenol for mild, Tramadol for mod/severe  ?? Neurogenic Bladder  ? I/O cath q6H (changed 10/7)   ? Maintain cath volumes <500 cc   ?? Neurogenic Bowel: UMN Bowel  ? 3:2:1 (Colace, Senna, Dulcolax suppository in evening + dig stim) up to [facility]   ? Cleanout with mag citrate + SMOG enema 10/24  ?? Spasticity  ? No complaints of spasms at this time.  ?? Equipment/Support  ? PRAFO boots to bilateral feet while in bed to prevent ankle contractures Wheelchair statement: The patient has mobility limitations that interfere with performing ADL???s and MADL???s and a wheelchair would greatly improve this. 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 Patient expresses a willingness to use a wheelchair and he has adequate space in their living environment for a wheelchair.   ?? DVT Prophylaxis  ? DVT ppx with Lovenox x 8 weeks (8 weeks = 11/27)   ?? Skin  ? Turn patient Q2H to prevent pressure injuries  ? Maintain nursing skin integrity protocol with daily skin checks  ??  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 MRI spine notable for multiple spinal cord lesions (increased T2??signal from T10??to conus medullaris). MRI brain with??single enhancing??brain lesion to the right??corona radiata. Per radiology,??spinal cord findings most consistent with toxoplasmosis, brain lesion less consistent (not rim enhancing). LP 8/22 notable for elevated cells, lymphocytes, and protein, toxoplasmosis and EBV positive, negative cytology. Sulfadiazine, pyrimethamine, and leucovorin started 8/24 for toxoplasmosis.??Repeat LP 8/25,??again??negative for malignant cells.??CMV+ in the blood but not CSF.??He received??ganciclovir 8/22-8/25.????Was initially switched to Bactrim (10/5-10/6) for maintenance notes per ID recs, but switched to sulfadiazine/pyrimethamine (10/7 - ) due to concerns from pharmacy.  - S/p induction therapy with Sulfadiazine,  pyrimethamine, and leucovorin for 6 weeks (8/24 - 10/5)  - Sulfadiazine/Pyrimethamine/Leuovorin (maintenance dose) (10/7 - )  ??  HIV/AIDS: Last CD4 = 231/14%. Diagnosed in 2007 and was treatment naive on admission (had previously declined ART due to religious reasons). Started Tivacay/Descovy 8/26.??  -  Plan for switch to [facility] on discharge. - Dr. Dolores Frame has initiated process to coordinate outpatient follow-up and drug assistance enrollment.  - Will need bactrim prophylaxis for PJP once therapy for Toxo completed (hold for now)  ????????????????????  Dry gangrene L hand   Etiology remains unclear, likely multifactorial. Bilateral UE arterial PVLs without any HD-significant findings and blood cultures obtained in the ED were negative. Has had extensive work up including multispecialty consultations including rheumatology, vascular surgery, hematology. Likely early atherosclerotic disease and recommended baby aspirin and agree with starting a statin (see ASCVD risk, below).   - Outpatient follow-up in hand clinic as scheduled   - Bandage changes daily   - Nitroglycerin cream prn pain   ??  Tinea pedis??and onychomycosis: Trichophyton confirmed on biopsy.   - Griseofulvin 375mg  BID x 6 months total (9/16 - )  - Continue econazole cream to bilateral feet BID  - nail care   ??  ASCVD Risk: Pooled Cohort 10-yr risk is 3%, however, HIV is an independent risk factor for CVD which is not included in the risk score. Pt has atherosclerotic disease in his lower extremities and dyslipidemia.   - Atorvastatin 20mg  daily  - Aspirin 81mg  daily   ??  Insomnia  - Melatonin 3mg  qhs  ??  FEN/PPX:  - Diet: Regular   - Fluids: Encourage PO hydration.  - Electrolytes: Monitor and replete PRN.  - DVT ppx: Lovenox.  - GI ppx: Not indicated at this time.    SUBJECTIVE:     Interval History: NAEO.  Seen again during therapy. Doing well, no complaints. Looking forward to going home soon. Bowel program not performed on [facility] yesterday. Will update nursing to perform only on BSC.     OBJECTIVE:     Vital signs (last 24 hours):   Temp:  [36.6 ??C-36.9 ??C] 36.6 ??C  Heart Rate:  [79-83] 79  Resp:  [18] 18  BP: (103-123)/(71-84) 123/84  MAP (mmHg):  [81-97] 97  SpO2:  [99 %-100 %] 100 %    Intake/Output:  I/O last 3 completed shifts:  In: -   Out: 2925 [Urine:2925]    Physical Exam: GEN: Middle aged male, no acute distress  HEENT: Normcephalic, sclera anicteric   Cardio: Warm and well perfused, no lower extremity edema noted  Resp: Normal work of breathing on room air  Abdomen: non distended   Neuro: Awake and Alert. Flaccid BLE.   Psych: Calm and cooperative    Labs: I have reviewed the labs and studies from the last 24hrs.    Imaging: reviewed.     Sueanne Margarita, D.O., PGY-2  [facility] Physical Medicine & Rehabilitation  Between 8am-5pm Monday-Friday, please page [phone]. After hours, please page PM&R 1st call.

## 2019-02-16 NOTE — Unmapped (Signed)
Pt is A & O x 4, does own intermittent I & O cath after setup,  bowel program in the evening, teaching will be done for self-care, denies pain, dressing on left third finger intact. Wife is here for training. Fall and skin care precautions maintained, call bell in reach, will continue to monitor.     Problem: Rehabilitation (IRF) Plan of Care  Goal: Plan of Care Review  Outcome: Progressing  Goal: Patient-Specific Goal (Individualization)  Outcome: Progressing  Goal: Absence of Hospital-Acquired Illness or Injury  Outcome: Progressing  Goal: Home Safety Plan Established  Outcome: Progressing  Goal: Demonstration of Effective Coping Strategies  Outcome: Progressing  Goal: Community Reintegration Plan Established  Outcome: Progressing     Problem: Fall Injury Risk  Goal: Absence of Fall and Fall-Related Injury  Outcome: Progressing     Problem: Wound  Goal: Optimal Wound Healing  Outcome: Progressing     Problem: Skin Injury Risk Increased  Goal: Skin Health and Integrity  Outcome: Progressing     Problem: Self-Care Deficit  Goal: Improved Ability to Complete Activities of Daily Living  Outcome: Progressing     Problem: Sensory Impairment  Goal: Sensory Impairment Goal: Effective Self-Management of Sensory Impairment  Outcome: Progressing     Problem: Tissue Perfusion Altered  Goal: Improved Tissue Perfusion  Outcome: Progressing     Problem: Adjustment to Injury (Spinal Cord Injury)  Goal: Optimal Coping with Life-Changing Injury  Outcome: Progressing     Problem: Bowel Elimination Impaired (Spinal Cord Injury)  Goal: Effective Bowel Elimination  Outcome: Progressing     Problem: Embolism (Spinal Cord Injury)  Goal: Absence of Embolism Signs/Symptoms  Outcome: Progressing     Problem: Extended Neurologic Impairment (Spinal Cord Injury)  Goal: Absence of Extended Neurologic Injury  Outcome: Progressing     Problem: Functional Ability Impaired (Spinal Cord Injury)  Goal: Optimal Functional Ability Outcome: Progressing     Problem: Hemodynamic Instability (Spinal Cord Injury)  Goal: Hemodynamic Stability Maintained  Outcome: Progressing     Problem: Neurogenic Shock (Spinal Cord Injury)  Goal: Effective Tissue Perfusion  Outcome: Progressing     Problem: Nutrition Impaired (Spinal Cord Injury)  Goal: Optimal Nutrition Intake  Outcome: Progressing     Problem: Orthostatic Hypotension (Spinal Cord Injury)  Goal: Absence of Postural Hypotension  Outcome: Progressing     Problem: Pain (Spinal Cord Injury)  Goal: Acceptable Pain Control  Outcome: Progressing     Problem: Respiratory Compromise (Spinal Cord Injury)  Goal: Effective Oxygenation and Ventilation  Outcome: Progressing     Problem: Sensorimotor Impairment (Spinal Cord Injury)  Goal: Optimal Sensorimotor Function  Outcome: Progressing     Problem: Skin Injury (Spinal Cord Injury)  Goal: Skin Health and Integrity  Outcome: Progressing     Problem: Thermoregulation Alteration (Spinal Cord Injury)  Goal: Body Temperature Remains in Desired Range  Outcome: Progressing     Problem: Urinary Elimination Impaired (Spinal Cord Injury)  Goal: Effective Urinary Elimination  Outcome: Progressing     Problem: Mobility Impairment  Goal: Optimal Mobility  Outcome: Progressing     Problem: Infection  Goal: Infection Symptom Resolution  Outcome: Progressing     Problem: Venous Thromboembolism  Goal: VTE (Venous Thromboembolism) Symptom Resolution  Outcome: Progressing     Problem: Adjustment to Surgery (Extremity Amputation)  Goal: Optimal Coping with Amputation  Outcome: Progressing     Problem: Bleeding (Extremity Amputation)  Goal: Absence of Bleeding  Outcome: Progressing     Problem: Functional Ability Impaired (Extremity  Amputation)  Goal: Optimal Functional Ability  Outcome: Progressing     Problem: Pain (Extremity Amputation)  Goal: Acceptable Pain Control  Outcome: Progressing     Problem: Residual Limb Care (Extremity Amputation) Goal: Optimal Residual Limb Healing  Outcome: Progressing

## 2019-02-16 NOTE — Unmapped (Signed)
Alert and oriented. Denies any pain. Unable to void, requires IO cath every 6 hrs. Flaccid on BLE. Dressing on left 3rd finger changed. Skin and fall precaution observed. Call bell within reach.    Problem: Rehabilitation (IRF) Plan of Care  Goal: Plan of Care Review  Outcome: Progressing  Goal: Patient-Specific Goal (Individualization)  Outcome: Progressing  Goal: Absence of Hospital-Acquired Illness or Injury  Outcome: Progressing  Goal: Home Safety Plan Established  Outcome: Progressing  Goal: Demonstration of Effective Coping Strategies  Outcome: Progressing  Goal: Community Reintegration Plan Established  Outcome: Progressing     Problem: Fall Injury Risk  Goal: Absence of Fall and Fall-Related Injury  Outcome: Progressing     Problem: Wound  Goal: Optimal Wound Healing  Outcome: Progressing     Problem: Self-Care Deficit  Goal: Improved Ability to Complete Activities of Daily Living  Outcome: Progressing     Problem: Sensory Impairment  Goal: Sensory Impairment Goal: Effective Self-Management of Sensory Impairment  Outcome: Progressing     Problem: Tissue Perfusion Altered  Goal: Improved Tissue Perfusion  Outcome: Progressing     Problem: Skin Injury (Spinal Cord Injury)  Goal: Skin Health and Integrity  Outcome: Progressing     Problem: Mobility Impairment  Goal: Optimal Mobility  Outcome: Progressing

## 2019-02-17 MED ORDER — GRISEOFULVIN ULTRAMICROSIZE 125 MG TABLET
ORAL_TABLET | Freq: Every day | ORAL | 0 refills | 30.00000 days | Status: CP
Start: 2019-02-17 — End: 2019-03-19
  Filled 2019-02-18: qty 90, 30d supply, fill #0

## 2019-02-17 MED ORDER — ECONAZOLE 1 % TOPICAL CREAM
Freq: Two times a day (BID) | TOPICAL | 0 refills | 0.00000 days | Status: CP
Start: 2019-02-17 — End: 2020-02-17
  Filled 2019-02-18: qty 15, 14d supply, fill #0

## 2019-02-17 MED ORDER — MELATONIN 3 MG TABLET
ORAL_TABLET | Freq: Every evening | ORAL | 0 refills | 60 days | Status: CP
Start: 2019-02-17 — End: ?
  Filled 2019-02-18: qty 60, 60d supply, fill #0

## 2019-02-17 MED ORDER — ASPIRIN 81 MG CHEWABLE TABLET
ORAL_TABLET | Freq: Every day | ORAL | 0 refills | 36 days | Status: CP
Start: 2019-02-17 — End: 2019-03-25
  Filled 2019-02-18: qty 36, 36d supply, fill #0

## 2019-02-17 MED ORDER — SULFAMETHOXAZOLE 800 MG-TRIMETHOPRIM 160 MG TABLET
ORAL_TABLET | Freq: Two times a day (BID) | ORAL | 0 refills | 30.00000 days | Status: CP
Start: 2019-02-17 — End: 2019-03-19
  Filled 2019-02-18: qty 60, 30d supply, fill #0

## 2019-02-17 MED ORDER — BISACODYL 10 MG RECTAL SUPPOSITORY
Freq: Every evening | RECTAL | 0 refills | 30.00000 days | Status: CP
Start: 2019-02-17 — End: 2019-03-19
  Filled 2019-02-18: qty 24, 24d supply, fill #0

## 2019-02-17 MED ORDER — DOCUSATE SODIUM 100 MG CAPSULE
ORAL_CAPSULE | Freq: Three times a day (TID) | ORAL | 0 refills | 30 days | Status: CP
Start: 2019-02-17 — End: 2019-03-19
  Filled 2019-02-18: qty 90, 30d supply, fill #0

## 2019-02-17 MED ORDER — ATORVASTATIN 20 MG TABLET
ORAL_TABLET | Freq: Every day | ORAL | 0 refills | 30.00000 days | Status: CP
Start: 2019-02-17 — End: 2019-03-19
  Filled 2019-02-18: qty 30, 30d supply, fill #0

## 2019-02-17 MED ORDER — SENNOSIDES 8.6 MG TABLET
ORAL_TABLET | Freq: Every day | ORAL | 0 refills | 30 days | Status: CP
Start: 2019-02-17 — End: 2019-03-19
  Filled 2019-02-18: qty 60, 30d supply, fill #0

## 2019-02-17 MED ORDER — BICTEGRAVIR 50 MG-EMTRICITABINE 200 MG-TENOFOVIR ALAFENAM 25 MG TABLET
ORAL_TABLET | Freq: Every day | ORAL | 0 refills | 30 days | Status: CP
Start: 2019-02-17 — End: ?

## 2019-02-17 MED ORDER — ENOXAPARIN 40 MG/0.4 ML SUBCUTANEOUS SYRINGE
SUBCUTANEOUS | 0 refills | 34.00000 days | Status: CP
Start: 2019-02-17 — End: 2019-02-17

## 2019-02-17 MED ORDER — TRAMADOL 50 MG TABLET
ORAL_TABLET | Freq: Four times a day (QID) | ORAL | 0 refills | 5 days | Status: CP | PRN
Start: 2019-02-17 — End: 2019-02-22
  Filled 2019-02-18: qty 10, 5d supply, fill #0

## 2019-02-17 NOTE — Unmapped (Addendum)
Discharge Services and Resources:  ??  Home Health Services:  Home Health Skilled Nursing, Physical Therapy and Occupational Therapy  has been set up with [facility] [phone].  Start of Care for Home Health SN will be within 48 hours of discharge. If you have not been contacted by Home Health by Sat 02/20/19, please call the above number regarding services.     If you have any questions regarding home health please call the above number.      You will need to follow up with your Personal Care Physician or your Neurologist regarding setting up outpatient physical therapy and occupational therapy after Home Health has discharged.  You are unable to start outpatient therapy until home health has discharged you.   ??  Home Medical Equipment: (To be delivered to your hospital room prior to discharge)  ??  Padded Drop Arm Bedside Commode  Slide Board  Tub Transfer Bench with Peri- cutout  ??  [facility]  [phone]  ??  If you have any questions regarding your medical equipment after discharge, please call the above number.      Catheter Supplies:   ??  You were provided samples of catheters by prior to discharge.   will be contacting you and will ship catheters to your home.  ??    [phone]  ??  If you have any questions regarding your catheters or delivery, please call the above number.  ??  Disability Parking Placard  You have received an application for a disability parking placard that you can obtain at your local [employer] for $5.    ??  Medications:   [facility] will deliver your discharge medications to the bedside.      Vocational Rehabilitation: You may find you need additional support to return to independent living. The [facility] of [facility] provides counseling, training, education, transportation, job placement, Designer, multimedia, home modifications and other support services.????These services are provided to people with new physical and/or cognitive difficulties to assist them with living independently and/or returning to work. You can call the main number below and they will link you to your local office for an evaluation:  ??  Main Office  [address] [place], [place] [address]  Telephone: [phone]     [facility] Pharmacy Assistance Program: You were approved for a  14 day temporary benefit. Please return the Pharmacy Assistance application and the requested documents to the financial counselor at the Outpatient Pharmacy in the [facility]. You may fax it, mail it (takes longer) or return the application in person. Please return all the requested documents within 2-3 days after discharge to avoid running out of medications.       [facility] Application: Please return the completed [facility] application and supporting documents to the address listed at the top of the application. If approved, this program will assist you with your medical bills and reduce your out of pocket cost.  Please complete and return this application within 1 weeks of discharge.

## 2019-02-17 NOTE — Unmapped (Signed)
AA&O x3. Independent with meals. Denies pain. Drsg to 3rd finger of left hand clean D&I. Patient performing self cath and self bowel program now.deep vein thrombosis Safety precautions maintained with bed in low position, wheels locked and call bell in reach. Will continue to monitor.

## 2019-02-17 NOTE — Unmapped (Signed)
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   - SLP for cognitive and swallow function.  - Neuropsych for higher level cognitive evaluation and coping.  - RT for community re-integration, education, and leisure support services.  - P&O for assistive devices PRN.  - Pharmacy consult for patient and family education on medication management.   - Nutrition consult for diet information/teaching.   - To be discussed in weekly Interdisciplinary Team Conference.  ??  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 Workup significant for MRI showing increased T2 signal in the spinal cord from the level of T10 to the conus medullaris primarily involving the gray matter tracts with focal T2 hyperintensity in the dorsal columns at T10.   ?? Pain Control  ? Tylenol for mild, Tramadol for mod/severe  ?? Neurogenic Bladder  ? I/O cath q6H (changed 10/7)   ? Maintain cath volumes <500 cc   ?? Neurogenic Bowel: UMN Bowel  ? 3:2:1 (Colace, Senna, Dulcolax suppository in evening + dig stim) up to [facility]   ? Cleanout with mag citrate + SMOG enema 10/24  ?? Spasticity  ? No complaints of spasms at this time.  ?? Equipment/Support  ? PRAFO boots to bilateral feet while in bed to prevent ankle contractures Wheelchair statement: The patient has mobility limitations that interfere with performing ADL???s and MADL???s and a wheelchair would greatly improve this. 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 Patient expresses a willingness to use a wheelchair and he has adequate space in their living environment for a wheelchair.   ?? DVT Prophylaxis: Lovenox   ?? Skin  ? Turn patient Q2H to prevent pressure injuries  ? Maintain nursing skin integrity protocol with daily skin checks  ??  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 MRI spine notable for multiple spinal cord lesions (increased T2??signal from T10??to conus medullaris). MRI brain with??single enhancing??brain lesion to the right??corona radiata. Per radiology,??spinal cord findings most consistent with toxoplasmosis, brain lesion less consistent (not rim enhancing). LP 8/22 notable for elevated cells, lymphocytes, and protein, toxoplasmosis and EBV positive, negative cytology. Sulfadiazine, pyrimethamine, and leucovorin started 8/24 for toxoplasmosis.??Repeat LP 8/25,??again??negative for malignant cells.??CMV+ in the blood but not CSF.??He received??ganciclovir 8/22-8/25.????Was initially switched to Bactrim (10/5-10/6) for maintenance notes per ID recs, but switched to sulfadiazine/pyrimethamine (10/7 - ) due to concerns from pharmacy.  - S/p induction therapy with Sulfadiazine,  pyrimethamine, and leucovorin for 6 weeks (8/24 - 10/5)  - Sulfadiazine/Pyrimethamine/Leuovorin (maintenance dose) (10/7 - ) --> transition to Bactrim at discharge  ??  HIV/AIDS: Last CD4 = 231/14%. Diagnosed in 2007 and was treatment naive on admission (had previously declined ART due to religious reasons). Started Tivacay/Descovy 8/26.??  - Plan for switch to  Biktarvy on discharge.  ????????????????????  Dry gangrene L hand Etiology remains unclear, likely multifactorial. Bilateral UE arterial PVLs without any HD-significant findings and blood cultures obtained in the ED were negative. Has had extensive work up including multispecialty consultations including rheumatology, vascular surgery, hematology. Likely early atherosclerotic disease and recommended baby aspirin and agree with starting a statin (see ASCVD risk, below).   - Outpatient follow-up in hand clinic as scheduled   - Bandage changes daily   - Nitroglycerin cream prn pain   ??  Tinea pedis??and onychomycosis: Trichophyton confirmed on biopsy.   - Griseofulvin 375mg  BID x 6 months total (9/16 - )  - Continue econazole cream to bilateral feet BID  - nail care   ??  ASCVD Risk: Pooled Cohort 10-yr risk is 3%, however, HIV is an independent risk factor for CVD which is not included in the risk score. Pt has atherosclerotic disease in his lower extremities and dyslipidemia.   - Atorvastatin 20mg  daily  - Aspirin 81mg  daily   ??  Insomnia  - Melatonin 3mg  qhs  ??  FEN/PPX:  - Diet: Regular   - Fluids: Encourage PO hydration.  - Electrolytes: Monitor and replete PRN.  - DVT ppx: Lovenox.  - GI ppx: Not indicated at this time.    SUBJECTIVE:     Interval History: NAEO.  Doing well, no complaints. Excited to go home tomorrow.     OBJECTIVE:     Vital signs (last 24 hours):   Temp:  [36.7 ??C] 36.7 ??C  Heart Rate:  [76-81] 76  Resp:  [17-19] 17  BP: (116-121)/(75-78) 116/78  MAP (mmHg):  [90] 90  SpO2:  [99 %-100 %] 100 %    Intake/Output:  I/O last 3 completed shifts:  In: -   Out: 2525 [Urine:2525]    Physical Exam:    GEN: Middle aged male, no acute distress  HEENT: Normcephalic, sclera anicteric   Cardio: Warm and well perfused, no lower extremity edema noted  Resp: Normal work of breathing on room air  Abdomen: non distended   Neuro: Awake and Alert. Flaccid BLE.   Psych: Calm and cooperative    Labs: I have reviewed the labs and studies from the last 24hrs.    Imaging: reviewed. Sueanne Margarita, D.O., PGY-2  [facility] Physical Medicine & Rehabilitation  Between 8am-5pm Monday-Friday, please page [phone]. After hours, please page PM&R 1st call.

## 2019-02-17 NOTE — Unmapped (Signed)
Alert and oriented. Flaccid on BLE. Unable to void, requires IO cath every 6 hrs. On bowel program. Dressing on left 3rd finger changed. Skin and fall precaution observed. Call bell within reach.    Problem: Rehabilitation (IRF) Plan of Care  Goal: Plan of Care Review  Outcome: Progressing  Goal: Patient-Specific Goal (Individualization)  Outcome: Progressing  Goal: Absence of Hospital-Acquired Illness or Injury  Outcome: Progressing  Goal: Home Safety Plan Established  Outcome: Progressing  Goal: Demonstration of Effective Coping Strategies  Outcome: Progressing  Goal: Community Reintegration Plan Established  Outcome: Progressing     Problem: Fall Injury Risk  Goal: Absence of Fall and Fall-Related Injury  Outcome: Progressing     Problem: Wound  Goal: Optimal Wound Healing  Outcome: Progressing     Problem: Skin Injury Risk Increased  Goal: Skin Health and Integrity  Outcome: Progressing     Problem: Self-Care Deficit  Goal: Improved Ability to Complete Activities of Daily Living  Outcome: Progressing     Problem: Pain (Spinal Cord Injury)  Goal: Acceptable Pain Control  Outcome: Progressing     Problem: Mobility Impairment  Goal: Optimal Mobility  Outcome: Progressing

## 2019-02-17 NOTE — Unmapped (Signed)
[facility] Physical Medicine and Rehab  Discharge Summary    Patient Name: Don [name]       Medical Record Number: [id number]   Date of Birth: [birth year]  Sex: Male          Room/Bed: 7300/7300-01  Payor Info: Payor: BCBS / Plan: BCBS BLUE VALUE / Product Type: *No Product type* /      Admit Date: 01/22/2019  Discharge Date: 02/18/2019  Admitting Physician: Sena Hitch, M.D.  Discharge Physician: Sena Hitch, M.D.  Impairment Group:??(Spinal Cord Dysfunction - Non-Traumatic) 04.111 Paraplegia, Incomplete    Admission Functional Status: Discharge Functional Status:   Feeding - Needs Assistance: Requires additional structure  ??  Grooming - Needs Assistance: Requires additional structure  ??  Bathing - Needs Assistance: Mod assist  ??  Toileting - Needs Assistance: Max assist  ??  UB Dressing - Needs Assistance: Min assist;Physical assistance required  ??  LB Dressing - Needs Assistance: Max assist  ??  IADLs: NT  ??  ??  Mobility:??  Bed Mobility: Supine to sit min assist, sit to supine mod assist.EOB ~20 minutes initially requiring min A for balance  ??  Transfers: Bed<>chair via transfer board with max assist.  ??  Gait: Unable  ??  Stairs: NA  ??  Wheelchair Mobility: NA  ??  ??  Cognition/Swallow/Speech:??Alert, OX4, follow commands, reg diet  ??  DME Recommendations:  PT DME Recommendations: Defer to post acute  ??  ??  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 Workup notable for Toxoplasma and EBV in his CSF, as well as CMV+ low level in the peripheral blood but negative in CSF. He was most recently found to have significant diffuse lymphadenopathy on PET scan concerning for lymphoma and is now s/p left axillary excisional lymph node biopsy on 9/10, which was negative for malignancy and organisms. He continues to require intermittent SBA-modA for boost transfers, encouraged patient to use slideboard to ensure safety with transfers. He will be discharging home with a loaner manual wheelchair as he is now wheelchair dependent and is not safe to attempt any ambulation. At this time, patient will benefit from HHPT to help maximize his functional independence.    OCCUPATIONAL THERAPY [name] is a 43 y.o. 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 Patient demonstrated good progress during inpatient rehab stay. Patient is independent with grooming, MOD I UBD, MIN A LBD, MIN A bathing, and CGA for BSC transfer. Patient prefers to transfer without slide board however, patient is much more independent and safe with slide board. Patient is able to self cath and perform bowel program on [facility] with set up. Patient's independence with bed mobility and transfers  is inconsistent 2/2 intermittent L hand pain. Patient will benefit from home health OT in order to maximize safety and independence with ADLs and functional mobility within home environment.         Indication for Admission / HPI: Don [name] is a 43 year old male admitted to [facility] on 12/10/2018 with previously untreated HIV/AIDS (CD4 142 on admission) who initially presented with 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 Workup notable for Toxoplasma and EBV in his CSF, as well as CMV+ low level in the peripheral blood but negative in CSF. He was most recently found to have significant diffuse lymphadenopathy on PET scan concerning for lymphoma and is now s/p left axillary excisional lymph node biopsy on 9/10, which was negative for malignancy and organisms. Mr. Don [name] has participated in acute inpatient physical and occupational therapies to improve functional mobility, activity tolerance, functional strength, balance, and endurance in order to facilitate safe performance of ADLs and daily routines. Mr. Don [name] has been referred to [facility] AIR for continued acute medical management, provision of intensive inpatient therapies, and patient/family training to facilitate safe performance of ADLs and mobility prior to discharge home.    Overall, patient did extremely well in AIR. He quickly grasped bowel and bladder programs and has made great progress. He is independent at a wheelchair level and could very likely work in the future with adaptations.     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 Gradually worsening LE weakness/numbness??progressing to lower extremity paralysis 8/22,. MRI spine notable for multiple spinal cord lesions (increased T2??signal from T10??to conus medullaris). MRI brain with??single enhancing??brain lesion to the right??corona radiata. Workup significant for MRI showing increased T2 signal in the spinal cord from the level of T10 to the conus medullaris primarily involving the gray matter tracts with focal T2 hyperintensity in the dorsal columns at T10. Per radiology,??spinal cord findings most consistent with toxoplasmosis, brain lesion less consistent (not rim enhancing). LP 8/22 notable for elevated cells, lymphocytes, and protein, toxoplasmosis and EBV positive, negative cytology. Sulfadiazine, pyrimethamine, and leucovorin started 8/24 for toxoplasmosis.??Repeat LP 8/25,??again??negative for malignant cells.??CMV+ in the blood but not CSF. He completed induction therapy with Sulfadiazine, ??pyrimethamine, and leucovorin for 6 weeks while in AIR and was transitioned to Bactrim at time of discharge.   ?? Neurogenic Bladder: Patient was able to demonstrate CIC cathing technique while in AIR. He should continue performing self-cathing every 6 hours, or more frequently if his fluid intake significantly increases. He was referred to urology at time of discharge.   ?? Neurogenic Bowel: UMN Bowel Program   ? 3:2:1 (Colace, Senna, Dulcolax suppository in evening + dig stim) up to [facility]   ? Patient was able to perform his bowel program independently on the [facility] while in AIR   ?? Equipment/Support  ? PRAFO boots to bilateral feet while in bed to prevent ankle contractures ? Wheelchair statement: The patient has mobility limitations that interfere with performing ADL???s and MADL???s and a wheelchair would greatly improve this. 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 Patient expresses a willingness to use a wheelchair and he has  adequate space in their living environment for a wheelchair.   ?? DVT Prophylaxis: Lovenox while in AIR. Given the patient's SCI injury was non-traumatic and PVLs for DVT were negative on admission, 8 weeks of DVT prophylaxis was not indicated for this patient.     ??  HIV/AIDS: Last CD4 = 231/14%. Diagnosed in 2007 and was treatment naive on admission (had previously declined ART due to religious reasons). Wife has been frequently tested and is reportedly HIV negative. Started Tivacay/Descovy while on acute. He was switched to [place] on discharge. He will have close follow up with infectious disease   ????????????????????  Dry Gangrene of Left Hand: Etiology remains unclear, likely multifactorial. Bilateral UE arterial PVLs without any HD-significant findings and blood cultures obtained in the ED were negative. Has had extensive work up including multispecialty consultations including rheumatology, vascular surgery, hematology. Likely early atherosclerotic disease - started on ASA and Statin (based on ASCVD risk) while on acute. He will have follow     Tinea pedis??and onychomycosis: Trichophyton confirmed on biopsy. He was continued on Griseofulvin 375mg  BID on 01/06/2019 and will require x 6 months total therapy. Also continued econazole cream to bilateral feet BID.       Consults: None    Procedures: None    Discharge Medications:      Your Medication List      STOP taking these medications    aluminum-magnesium hydroxide-simethicone 400-400-40 mg/5 mL suspension  Commonly known as: MAALOX MAX     gabapentin 100 MG capsule  Commonly known as: NEURONTIN leucovorin 10 MG tablet  Commonly known as: WELLCOVORIN     magnesium oxide 400 mg (241.3 mg magnesium) tablet  Commonly known as: MAG-OX     ondansetron 8 MG disintegrating tablet  Commonly known as: ZOFRAN-ODT     pyrimethamine 25 mg tablet  Commonly known as: DARAPRIM     sulfaDIAZINE 500 mg tablet        START taking these medications    docusate sodium 100 MG capsule  Commonly known as: COLACE  Tome 1 c??psula (100 mg total) por v??a oral tres (3) veces al d??a.  (Take 1 capsule (100 mg total) by mouth Three (3) times a day.)     senna 8.6 mg tablet  Commonly known as: SENNA  Take 2 tablets by mouth daily with lunch.     sulfamethoxazole-trimethoprim 800-160 mg per tablet  Commonly known as: BACTRIM DS  Tome 1 tableta (160 mg total de trimethoprim) por v??a oral dos (2) veces al d??a.  (Take 1 tablet (160 mg of trimethoprim total) by mouth Two (2) times a day.)     traMADoL 50 mg tablet  Commonly known as: ULTRAM  Take 1/2 tablet (25 mg) by mouth every six (6) hours as needed for up to 5 days.        CHANGE how you take these medications    bisacodyL 10 mg suppository  Commonly known as: DULCOLAX  Insert 1 suppository (10 mg total) into the rectum every evening.  What changed:   ?? when to take this  ?? reasons to take this     griseofulvin 125 MG tablet  Commonly known as: GRIS-PEG  Take 3 tablets (375 mg total) by mouth daily.  What changed: when to take this        CONTINUE taking these medications    aspirin 81 MG chewable tablet  Mastique 1 tableta (81 mg total) a diario.  (Chew 1 tablet (81 mg  total) daily.)     atorvastatin 20 MG tablet  Commonly known as: LIPITOR  Tome una tableta (20 mg en total) por v??a oral diariamente.  (Take 1 tablet (20 mg total) by mouth daily.)     bictegrav-emtricit-tenofov ala 50-200-25 mg tablet  Commonly known as: BIKTARVY  Tome una tableta por v??a oral diariamente.  (Take 1 tablet by mouth daily.)     econazole nitrate 1 % cream  Commonly known as: SPECTAZOLE Aplique de manera t??pica dos (2) veces al d??a.  (Apply topically Two (2) times a day.)     melatonin 3 mg Tab  Tome 1 tableta (3 mg total) por v??a oral todas las noches.  (Take 1 tablet (3 mg total) by mouth every evening.)          Significant Diagnostic Studies: Reviewed in Epic      Discharge Instructions:     Medications:  Please take all medications as prescribed below and note any changes.    Other Instructions and Information:   Follow Up instructions and Outpatient Referrals     Ambulatory referral to Family Practice      Ambulatory referral to Home Health      Is this a [place] or [facility] [facility] referral?: No    Physician to follow patient's care: Referring Provider    Disciplines requested:  Physical Therapy  Occupational Therapy  Nursing       Nursing requested: Teaching/skilled observation and assessment    What teaching is needed (new diagnosis? new medications?): Assess CP status, S/S infection, Assess urinary and bowel status, F/U teaching on I/O cathing and bowel program if needed, Med teaching.    Physical Therapy requested: Evaluate and treat    Occupational Therapy Requested: Evaluate and treat    Requested start of care date: Routine (within 48 hours)    Do you want ongoing co-management?: No    Care coordination required?: No    Ambulatory referral to Urology      Discharge instructions              Follow-Up Appointments:   Please make all follow-up appointments as noted below. If not already scheduled, please set-up an appointment with a Primary Care Provider for continued general medical care.  Other Instructions     Discharge instructions      Mr. Don [name],    You were hospitalized at [facility] due to a spinal cord injury (SCI) from an infection. You have made excellent progress during your rehab stay.     MEDICATIONS:  Please take all your medications as prescribed above and note any changes.  Below is a list of your medications and what you take them for: - HIV: Biktarvy  - Infection (Toxoplasmosis): Bactrim   - Toenail fungus: Griseofulvin   - Heart health: Aspirin, Atorvastatin     DIET:  Please try to consume a heart healthy diet and limit your intake of fast food, sweets, and sodas.     ACTIVITY:   Please continue to perform daily tasks and mobility as instructed by your doctors and therapists.    BOWEL PROGRAM:  - Docusate three times daily, Senna at noon, and Bisacodyl suppository at [facility].  - Perform digital stimulation with administration of evening suppository. Performed 5-10 minutes after inserting the suppository:   1) With the patient lying on their left side, gently insert a lubricated finger into the rectum.   2) Gently move the finger in a counterclockwise circular motion  keeping the finger in contact with the rectal wall.   3) Continue for 20-60 seconds and no longer than 1 minute.   4) Repeat every 5-10 minutes until a bowel movement is produced.   4) Completion of the bowel program is noted by no further stool after 2 digital stimulations or when mucus is excreted without stool.    BLADDER PROGRAM:  - Continue performing In and Out catheterization every 6 hours.   - If you largely increase your amount of fluid intake you may have to increase the frequency you perform catheterization.  - If you begin to leak inbetween catheterizations, please contact Dr. Alfredia Ferguson.     BLOOD CLOT PREVENTION: Patients with spinal cord injuries are at increased risk of developing blood clots the first 8 weeks after injury.  - Continue your Lovenox shots until 03/19/2019     WHEN TO CALL YOUR PHYSICIAN:  Following discharge from the hospital, please call 911 immediately and go to the nearest Emergency Department if you notice:  - Temperature greater than 101.78F or chills  - Pain not controlled with prescribed medications  - Uncontrolled nausea or vomiting  - Worsening or persistent abdominal pain - Inability to pass stool for 3 days or more despite performing bowel program  - Inability to empty bladder after several catheterization attempts  - Worsening or uncontrolled spasms  - Severe or worsening headache with no known cause  - Increased weakness or sensory changes   - Chest pain or shortness of breath      If you develop these symptoms or if you have trouble obtaining any of your medications, you may also call the [facility] Rehabilitation [facility] at [phone]  as needed.    You may go to the [facility] at [address] in [place] or call the [facility] Link at [phone] for further assistance.      Follow-up:    You will have follow-up visits with the following clinics, please call the provided numbers if you do not have a visit on your discharge paperwork or hear from these clinics within a week from discharge.     [facility] Physical Medicine & Rehabilitation Clinic [facility], [address] [place], [place] [address], Phone: [phone], Fax: [phone]  [facility], 2nd Floor, [address] [place], [place] [address] - Phone: [phone]  [facility] Infectious Disease Clinic - [facility], 1st Floor, [address], [place], [place] [address] - Phone: [phone] - Fax: [phone]    - A referral to with [facility] Urology (Dr. Raoul Pitch)  has been made. If you are not contacted within 1-2 weeks after discharge, you will need contact the office to schedule an appointment date.       Dr. Raoul Pitch:    [facility]   [address]   [facility] Office Building [facility]   Third Floor   [place], [place] [address]   Phone [phone]    A referral to [facility] Medicine has been made so you can get established with a PCP. If you are not contacted within 1 week after discharge to schedule an appointment, please call the number below. [facility] - [address] [place], [place] [address] - Phone: [phone]             Appointments which have been scheduled for you  Mar 01, 2019  1:00 PM  (Arrive by 12:45 PM)  HOSPITAL FOLLOW UP with Hoyle Sauer, MD  [facility] INFECTIOUS DISEASES [address] [facility]) [address]  [place] [address]  [phone]      Mar 10, 2019  8:30 AM  NEW  HAND with Albin Fischer, [place]  [facility] ORTHOPAEDICS [facility] REGION) [address]  [place] [address]  [phone]      Mar 25, 2019  2:40 PM  (Arrive by 2:25 PM)  RETURN  GENERAL with Ronelle Nigh, MD  [facility] PHYSICAL MEDICINE [facility] [address]  [facility] REGION) [address] [place] [address]  [phone]           Discharge Day Services:  The patient was seen and examined on the day of discharge. Vitals signs and exam are stable. Therapy goals met. Discharge medications and instructions were discussed with the patient and family, and all questions were answered.    Time spent for discharge: 30 minutes or greater    Physical Exam:  Vitals:    02/18/19 0600   BP: 125/88   Pulse: 79   Resp: 17   Temp: 36.5 ??C   SpO2: 100%              General Appearance: Well appearing, in no acute distress.  HEENT: Head is atraumatic and normocephalic. Sclera anicteric without injection. Oropharyngeal membranes are moist with no erythema or exudate.  Neck: Supple.  Lungs: Normal work of breathing. Clear to auscultation in anterior fields. No wheezes or crackles.  Heart: Regular rate and rhythm. No murmurs, rubs, or gallops.  Abdomen: Soft, nontender, nondistended.  Extremities: No clubbing, cyanosis, or edema. + Dressing to L hand c/d/i   Neurological Examination: Mental Status: Alert, conversant, able to follow conversation and interview. Spontaneous speech was fluent without word finding pauses, dysarthria, or paraphasic errors. Comprehension was intact. Memory for recent and remote events was intact.  Cranial Nerves: PERRL. Facial sensation intact bilaterally to light touch in all three divisions of CNV. Face symmetric at rest. Normal facial movement bilaterally, including forehead, eye closure and grimace/smile. Hearing intact to conversation. Shoulder shrug full strength bilaterally. Palate movement is symmetric. Tongue protrudes midline and tongue movements are normal.   ??       Motor:   RUE:      5/5 elbow flexion (C5)              5/5 wrist extension (C6)              5/5 elbow extension (C7)              5/5 long finger flexors (C8)              5/5 abductor digiti minimi (T1)  LUE:      5/5 elbow flexion (C5)              5/5 wrist extension (C6)              5/5 elbow extension (C7)              5/5 long finger flexors (C8)              5/5 abductor digiti minimi (T1)  RLE:      0/5 hip flexion (L2)  0/5 knee extension (L3)              0/5 ankle dorsiflexion (L4)              0/5 extensor hallicus longus (L5)              0/5 plantar flexion (S1)  LLE:       1/5 hip flexion (L2)              0/5 knee extension (L3)              0/5 ankle dorsiflexion (L4)              0/5 extensor hallicus longus (L5)              0/5 plantar flexion (S1)   ??  Sensory: BUE sensation intact to light touch. Decreased sensation to light touch to BLEs.               Tone: within normal limits, no spasticity noted  ??  Psych: mood euthymic, affect appropriate, thought process logical      Discharge Condition: Stable    Discharge Disposition: home with family assistance/supervision    Home Health: Physical Therapy and Occupational Therapy       Sueanne Margarita, DO  [facility] Physical Medicine & Rehabilitation

## 2019-02-18 MED FILL — GENTLE LAXATIVE (BISACODYL) 10 MG RECTAL SUPPOSITORY: 24 days supply | Qty: 24 | Fill #0 | Status: AC

## 2019-02-18 MED FILL — ASPIRIN 81 MG CHEWABLE TABLET: 36 days supply | Qty: 36 | Fill #0 | Status: AC

## 2019-02-18 MED FILL — SULFAMETHOXAZOLE 800 MG-TRIMETHOPRIM 160 MG TABLET: 30 days supply | Qty: 60 | Fill #0 | Status: AC

## 2019-02-18 MED FILL — DOK 100 MG CAPSULE: 30 days supply | Qty: 90 | Fill #0 | Status: AC

## 2019-02-18 MED FILL — GRISEOFULVIN ULTRAMICROSIZE 125 MG TABLET: 30 days supply | Qty: 90 | Fill #0 | Status: AC

## 2019-02-18 MED FILL — ATORVASTATIN 20 MG TABLET: 30 days supply | Qty: 30 | Fill #0 | Status: AC

## 2019-02-18 MED FILL — TRAMADOL 50 MG TABLET: 5 days supply | Qty: 10 | Fill #0 | Status: AC

## 2019-02-18 MED FILL — SENNA LAX 8.6 MG TABLET: 30 days supply | Qty: 60 | Fill #0 | Status: AC

## 2019-02-18 MED FILL — ECONAZOLE 1 % TOPICAL CREAM: 14 days supply | Qty: 15 | Fill #0 | Status: AC

## 2019-02-18 MED FILL — MELATONIN 3 MG TABLET: 60 days supply | Qty: 60 | Fill #0 | Status: AC

## 2019-02-18 NOTE — Unmapped (Signed)
A&O x 4. Calm, pleasant, cooperative. Denies pain. Food and fluids well tolerated. Q6H I/O cath as charted. Dressing changed. Safety precautions in situ. CTM.     Problem: Rehabilitation (IRF) Plan of Care  Goal: Plan of Care Review  Outcome: Progressing  Goal: Patient-Specific Goal (Individualization)  Outcome: Progressing  Goal: Absence of Hospital-Acquired Illness or Injury  Outcome: Progressing  Goal: Home Safety Plan Established  Outcome: Progressing  Goal: Demonstration of Effective Coping Strategies  Outcome: Progressing     Problem: Fall Injury Risk  Goal: Absence of Fall and Fall-Related Injury  Outcome: Progressing     Problem: Wound  Goal: Optimal Wound Healing  Outcome: Progressing     Problem: Skin Injury Risk Increased  Goal: Skin Health and Integrity  Outcome: Progressing     Problem: Self-Care Deficit  Goal: Improved Ability to Complete Activities of Daily Living  Outcome: Progressing     Problem: Sensory Impairment  Goal: Sensory Impairment Goal: Effective Self-Management of Sensory Impairment  Outcome: Progressing     Problem: Tissue Perfusion Altered  Goal: Improved Tissue Perfusion  Outcome: Progressing     Problem: Adjustment to Injury (Spinal Cord Injury)  Goal: Optimal Coping with Life-Changing Injury  Outcome: Progressing     Problem: Bowel Elimination Impaired (Spinal Cord Injury)  Goal: Effective Bowel Elimination  Outcome: Progressing     Problem: Embolism (Spinal Cord Injury)  Goal: Absence of Embolism Signs/Symptoms  Outcome: Progressing     Problem: Extended Neurologic Impairment (Spinal Cord Injury)  Goal: Absence of Extended Neurologic Injury  Outcome: Progressing     Problem: Functional Ability Impaired (Spinal Cord Injury)  Goal: Optimal Functional Ability  Outcome: Progressing     Problem: Hemodynamic Instability (Spinal Cord Injury)  Goal: Hemodynamic Stability Maintained  Outcome: Progressing     Problem: Neurogenic Shock (Spinal Cord Injury)  Goal: Effective Tissue Perfusion Outcome: Progressing     Problem: Nutrition Impaired (Spinal Cord Injury)  Goal: Optimal Nutrition Intake  Outcome: Progressing     Problem: Orthostatic Hypotension (Spinal Cord Injury)  Goal: Absence of Postural Hypotension  Outcome: Progressing     Problem: Pain (Spinal Cord Injury)  Goal: Acceptable Pain Control  Outcome: Progressing     Problem: Respiratory Compromise (Spinal Cord Injury)  Goal: Effective Oxygenation and Ventilation  Outcome: Progressing     Problem: Sensorimotor Impairment (Spinal Cord Injury)  Goal: Optimal Sensorimotor Function  Outcome: Progressing     Problem: Skin Injury (Spinal Cord Injury)  Goal: Skin Health and Integrity  Outcome: Progressing     Problem: Thermoregulation Alteration (Spinal Cord Injury)  Goal: Body Temperature Remains in Desired Range  Outcome: Progressing     Problem: Urinary Elimination Impaired (Spinal Cord Injury)  Goal: Effective Urinary Elimination  Outcome: Progressing     Problem: Mobility Impairment  Goal: Optimal Mobility  Outcome: Progressing     Problem: Infection  Goal: Infection Symptom Resolution  Outcome: Progressing     Problem: Venous Thromboembolism  Goal: VTE (Venous Thromboembolism) Symptom Resolution  Outcome: Progressing     Problem: Adjustment to Surgery (Extremity Amputation)  Goal: Optimal Coping with Amputation  Outcome: Progressing     Problem: Bleeding (Extremity Amputation)  Goal: Absence of Bleeding  Outcome: Progressing     Problem: Functional Ability Impaired (Extremity Amputation)  Goal: Optimal Functional Ability  Outcome: Progressing     Problem: Pain (Extremity Amputation)  Goal: Acceptable Pain Control  Outcome: Progressing     Problem: Residual Limb Care (Extremity Amputation)  Goal: Optimal Residual  Limb Healing  Outcome: Progressing

## 2019-02-18 NOTE — Unmapped (Addendum)
Centro m?[facility]   [facility]  [address] [place] [address]  Loc: [phone]   RESUMEN DE LA VISITA  [place]iana [place]   N??m. de expediente: [phone]     Paraplegia, incomplete (CMS-HCC)  01/22/2019-  7 BT [facility]  [phone]  Los siguientes pasos  --------- Hacer----------  ? Estos 11 medicamentos se pueden recoger en [facility] CENTRAL OUT-PT PHARMACY    --------- [place]----------  ? Lea los 6 documentos adjuntos en espa??ol  ?   --------- Asistir----------  9 de noviembre SEGUIMIENTO DESPU??[facility]   Llegue a las 12:45 p. m.   Richardean Canal, MD  [facility] INFECTIOUS DISEASES [address] [facility] [place]   [address]   [address] [place] [address]   [phone]     Tiene m??s citas futuras.  Revise la [employer] de citas.         Instrucciones   Hable con el proveedor de atenci??n m??dica acerca de los medicamentos    PREGUNTE c??mo usar:     Sus medicamentos han cambiado     DEJE de usar:   aluminum-magnesium hydroxide-simethicone 400-400-40 mg/5 mL suspension (MAALOX MAX)    gabapentin 100 MG capsule (NEURONTIN)    leucovorin 10 MG tablet (WELLCOVORIN)    magnesium oxide 400 mg (241.3 mg magnesium) tablet (MAG-OX)    ondansetron 8 MG disintegrating tablet (ZOFRAN-ODT)    pyrimethamine 25 mg tablet (DARAPRIM)    sulfaDIAZINE 500 mg tablet      EMPIECE a usar:   docusate sodium (COLACE)    senna (SENNA)    sulfamethoxazole-trimethoprim (BACTRIM DS)    traMADoL (ULTRAM)      CAMBIE la manera de usar:   bisacodyL (DULCOLAX) ??? cu??ndo tomarlo- motivos para tomarlo   griseofulvin (GRIS-PEG) ??? cu??ndo tomarlo     SIGA tomando sus otros medicamentos     Revise la lista de medicamentos actualizada a continuaci??n                Otras instrucciones   Instrucciones de alta   Sr. Unice Cobble hospitalizado en [facility] despu??s de sufrir una lesi??n en la m??dula espinal debido a una infecci??n. Su progreso ha sido excelente durante su estancia en rehabilitaci??n.     MEDICAMENTOS:   Por favor tome todos los medicamentos recetados que se mencionan [place] y tome nota de cualquier cambio.   Abajo est?? una lista de sus medicamentos y para qu?? los toma.   VIH: Biktarvy   - Infecci??n (Toxoplasmosis): BACTRIM  - Hongos en las u??as de los pies: Griseofulvin   Cardiosaludable Aspirin, Atorvastatin     ALIMENTACI??N:   Trate de consumir una dieta cardiosaludable y limite el consumo de comida chatarra, dulces y sodas.     ACTIVIDAD:    Por favor siga haciendo las tareas rutinarias y mu??vase como le indicaron los m??dicos y terapeutas.     PROGRAMA DE TRATAMIENTO PARA LOS INTESTINOS:   - Docusate tres veces al d??a, Senna al mediod??a, y supositorio de  Bisacodyl a las 6 p. m.    - Lleve a cabo estimulaci??n digital cuando se coloque el supositorio  en la tarde. H??galo de 5 a 10 minutos despu??s de colocar el supositorio:   1) Mientras el paciente est?? acostado sobre su lado izquierdo, introduzca suavemente en el recto el dedo previamente lubricado.   2) Mueva el dedo suavemente en c??rculos en direcci??n en contra de las agujas  del reloj y mantenga el dedo en contacto con la pared del recto.   3) Contin??e durante 20 a 60 segundos pero no m??s de un minuto.    4) Rep??talo cada 5 a 10 minutos hasta que defeque.    4) Se completa el programa para defecar cuando ya no sale excremento despu??s de 2 estimulaciones digitales o cuando salga mucosidad sin excremento.      PROGRAMA PARA AYUDAR A ORINAR   -  Contin??e llevando a cabo cateterizaci??n cada 6 horas colocando y quitando la sonda cada vez.    - Si aumenta la ingesti??n de l??quidos puede que necesite aumentar la frecuencia de las cateterizaciones.     - Si empieza a gotear [employer] cateterizaci??n y [place], por favor contacte con Dr. Alfredia Ferguson. PREVENCI??N DE CO??GULOS DE SANGRE: Los pacientes con lesiones en la m??dula espinal tienen un mayor riesgo de Warehouse manager co??gulos de sangre las primeras 8 semanas despu??s de la lesi??n.   - Contin??e con las inyecciones de Lovenox Hasta el 27 de noviembre de 2020.     CU??NDO LLAMAR AL M??DICO:   Si despu??s del alta del hospital tiene algo de lo siguiente, por favor llame al 911 de inmediato o vaya a la sala de emergencias m??s cercana:   - Temperatura de m??s de 101.5 ??F o escalofr??os   - Dolor que no puede controlar con los medicamentos recetados.   - N??useas o v??mitos incontrolables.   - Dolor abdominal persistente o que empeora   - Incapacidad de defecar durante m??s de 3 d??as o m??s a pesar de haber llevado a cabo el programa de tratamiento para los intestinos.   - Incapacidad de vaciar la vejiga despu??s de intentar varias cateterizaciones.   - Empeoramiento o espasmos incontrolables.   - Dolor de cabeza intenso o que [place], de causa desconocida.   - Aumento de la debilidad o cambios sensoriales.   - Dolor en el pecho o falta de aliento.       Si surgen estos s??ntomas o si tiene problemas para obtener alguno de sus medicamentos, tambi??n puede llamar a la cl??nica de medicina f??sica y rehabilitaci??n de [place], ??[facility]?? al [phone] seg??n lo necesite.     Puede ir al [facility] en [address] en [place] o llamar a [facility] Crisoforo [place] al [phone] para obtener m??s ayuda.       Seguimiento:     Tendr?? visitas de seguimiento en las cl??nicas siguientes, por favor llame a los n??mero que le proporcionamos si no tiene una cita en sus documentos del alta o no le llaman dentro del plazo de una semana despu??s del alta.     [facility] Physical Medicine & Rehabilitation [facility] Care [address] [place], [place] [address], tel??fono: [phone], Fax: [phone] [facility] - [facility], 2.?? [place], [address] [place], [place] [address] - tel??fono: [phone]   [facility] - [facility], [address], [address] [place], [place] [address] - tel??fono: [phone] - Fax: [phone]     - Se mand?? Neomia Dear remisi??n para [facility] (Dr. Raoul Pitch). Si no contactan con usted en [employer] despu??s del alta, necesita contactar la oficina para programar la fecha de una cita.        Dr. Raoul Pitch:     [facility] Services [facility]   [address]   [facility]  Care Medical Office Building [facility]   [place], [place] [address]   Tel??fono  [phone]     Se mand?? Neomia Dear remisi??n a [facility] para asignarle un m??dico de cabecera. Si no contactan con usted en el plazo de una semana despu??s del alta para programar una cita, por favor llame al n??mero que se indica a continuaci??n.       [facility] - [address] [place], [place] [address] - tel??fono: [phone]      Recursos y remisiones    Inodoro port??til    [employer] se necesita: 99 meses   Tipo de inodoro port??til: Comentario acerca de la silla - inodoro port??til acolchonado con brazo removible   Tabla para deslizarse (equipo m??dico duradero)    Tabla para deslizarse: Standard   Tiempo que se necesita: 99 meses      Servicios del alta y recursos:     Servicios de salud a domicilio:  Los servicios de enfermera especializada a domicilio, fisioterapia y terapia ocupacional han sido programados con [facility] [phone].  El comienzo de los cuidados por parte de la enfermera especializada a domicilio ser?? en un plazo de 48 horas a partir del alta. Si no ha sido contactado por el servicio de salud a domicilio para el s??bado 31 de octubre de 2020, llame al n??mero mencionado anteriormente para estos servicios. Si tiene [employer] servicio de salud a domicilio, por favor llame al n??mero anteriormente mencionado.      Va a necesitar seguimiento con su m??dico de cuidados primarios o su neur??logo en cuanto a programar fisioterapia ambulatoria y terapia ocupacional despu??s de que el servicio de salud a domicilio lo d?? de alta.  No puede comenzar terapia ambulatoria hasta que el servicio de salud a domicilio lo haya dado de alta.      Equipo m??dico para casa: (Se entregar?? en su habitaci??n del hospital antes del alta)     Inodoro port??til acolchonado con brazo removible  Tabla para deslizarse     [employer]ahoo  [phone]     Si tiene preguntas en cuanto a su equipo m??dico despu??s de darlo de alta, por favor llame al n??mero mencionado arriba.       Suministros para el cat??ter:      Se le proporcionaron muestras de cat??teres por parte de antes del alta.   se va a poner en contacto con usted y le enviar?? los cat??teres a su domicilio.       [phone]     Si tiene [employer] cat??teres o la entrega, por favor llame al n??mero anteriormente mencionado.     Tarjeta de estacionamiento para discapacitados  Recibi?? una solicitud para la tarjeta de estacionamiento para discapacitados que puede obtener en su oficina local de [facility] por $5.       Medicamentos:   [facility] enviar?? los medicamentos que le recetaron al darle de alta a su habitaci??n.      Rehabilitaci??n vocacional: Puede que necesite apoyo adicional para volver a vivir de manera independiente. La Divisi??n de [place] de [place] de rehabilitaci??n vocacional proporciona servicios de asesor??a, entrenamiento, educaci??n, transporte, b??squeda de empleo, tecnolog??a de asistencia, modificaciones en el hogar y otros servicios de apoyo. Estos servicios se proporcionan a personas con dificultades cognitivas o f??sicas nuevas para ayudarlos a regresar al Aleen Campi y vivir de manera independiente.  Puede llamar al n??mero principal y  lo conectar??n a su oficina local para una evaluaci??n:     [facility] Principal   [address] [place], [place] [address]  Tel??fono: [phone]     Programa de [place] de [place] de [place]: Conley Rolls aprobaron un beneficio temporal de 14 d??as. Por favor devuelva la solicitud de [place] y los documentos solicitados al consejero financiero en la farmacia para pacientes ambulatorios en [facility]. Puede enviarlo por fax, por correo postal (se lleva m??s tiempo) o puede Conservation officer, historic buildings. Por favor devuelva todos los documentos solicitados en un plazo de 2 a 3 d??as despu??s del alta para evitar que se le terminen los medicamentos.       Solicitud para el programa de [place] caritativa de [place]:.  Por favor env??e la solicitud de ayuda caritativa completo y los documentos de apoyo a la direcci??n que se indica en la parte superior de la solicitud. Si lo aprueban, [employer]?? con las facturas m??dicas y reducir?? los gastos por cuenta propia.  Por favor complete y devuelve el formulario de solicitud en el plazo de 1 semana despu??s del alta.                   Remisiones ambulatorias  Instrucciones dirigidas a la compa??ia de servicios de salud a domicilio    Ambulatory referral to Home Health    Is this a [place] or [place] [facility] referral?: No   Physician to follow patient's care: Referring Provider   Disciplines requested:  Physical Therapy   Occupational Therapy   Nursing Nursing requested: Teaching/skilled observation and assessment   What teaching is needed (new diagnosis? new medications?): Assess CP status, S/S infection, Assess urinary and bowel status, F/U teaching on I/O cathing and bowel program if needed, Med teaching.   Physical Therapy requested: Evaluate and treat   Occupational Therapy Requested: Evaluate and treat   Requested start of care date: Routine (within 48 hours)   Do you want ongoing co-management?: No   Care coordination required?: No         Seguimiento         Seg??n el paciente no tiene m??dico de cabecera para el seguimiento           SEGUIMIENTO con Randalyn Rhea, MD [address]   [place]??n   [address]   [place] [address]  [phone]   9 de [place]??[facility] con Richardean Canal, MD  lunes 9 de noviembre de 2020 1:00 p. m. (Llegar a las 12:45 p. m.) [facility] INFECTIOUS DISEASES [address] Tat [place]  [address]   [place] [address]  [phone]   18 de [place] MANO-NUEVO con Earleen Reaper, [place]  mi??rcoles, 18 de [place] de [place] 8:30 a. m. Sherryle Lis [facility]  [address]   [place] [address]  [phone]   3 de [place]  GENERAL con Caro Hight, MD  jueves 3 de diciembre de 2020 a las 2:40 p. m. (Llegue a las 2:25 p. m.) [facility] PHYSICAL MEDICINE [address] [place]  [address]   [place] [address]  [phone]     Cuidado continuo       Equipo m??dico duradero    Yahoo  Servicios: Equipo m??dico duradero   Direcci??n: [address] [place], [place] [address]   Tel??fono:  [phone].      [facility]    [facility] HEALTH SERVICES  Servicios: Home Health Services   Direcci??n: [address] [address]   Tel??fono: [phone].       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 Proveedor a cargo Physical Medicine and Rehabilitation       Usted es al??rgico a lo siguiente    No tiene Surveyor, quantity       Se administraron las siguientes vacunas durante esta hospitalizaci??n    [place] Dosis Franco Nones VIS: Ruta   Vacuna contra la gripe Influenza Vaccine Quad (IIV4 PF) 33mo+ inyectable 01/22/2019 0.5 ml. 12/04/2017 Intramuscular   Fabricante: [employer]   Lote: [id number]   Franco Nones de vencimiento: 10/20/2019       Lista de medicamentos    Por la ma??ana Por la tarde Por la noche A la hora de irse a dormir Seg??n necesite    aspirin 81 MG chewable tablet  Mastique 1 tableta (81 mg total) a diario.  (Mastique 1 tableta (81 mg total) a diario).  ??ltima vez que se lo dieron: 81 mg. el 28 de octubre de 2020 7:50 a. m. X        atorvastatin 20 MG tablet  Com??nmente conocido como: LIPITOR  Tome una tableta (20 mg en total) por v??a oral a diario.  (Tome 1 tableta (20 mg total) por v??a oral a diario).  ??ltima vez que se lo dieron: 20 mg. el 27 de octubre de 2020 9:22 a. m. X        bictegrav-emtricit-tenofov ala 50-200-25 mg tablet  Com??nmente conocido como: BIKTARVY  Tome una tableta por v??a oral a diario.  (Tome 1 tableta por v??a oral a diario). X        bisacodyL 10 mg suppository  Com??nmente conocido como: DULCOLAX  Inserte 1 supositorio (10 mg total) en el recto todas las noches.  ??ltima vez que se lo dieron: 10 mg el 28 de octubre de 2020 5:59 p. m.  Qu?? cambi??:    Cu??ndo tomarlo   Motivos para tomar esto   X      docusate sodium 100 MG capsule  Com??nmente conocido como: COLACE  Tome 1 c??psula (100 mg total) por v??a oral tres (3) veces al d??a.  (Tome 1 c??psula (100 mg total) por v??a oral tres (3) veces al d??a).  ??ltima vez que se lo dieron: 100 mg. el 28 de octubre de 2020 9:09 p. m. X X  X econazole nitrate 1 % cream  Com??nmente conocido como: SPECTAZOLE  Aplique de manera t??pica dos (2) veces al d??a.  (Aplique de manera t??pica dos (2) veces al d??a).  ??ltima vez que se lo dieron: el 28 de octubre de 2020 9:09 p. m. X  X      griseofulvin 125 MG tablet  Com??nmente conocido como: GRIS-PEG  Tome 3 tabletas (375 mg total) por v??a oral a diario.  ??ltima vez que se lo dieron: 375 mg. el 28 de octubre de 2020 7:49 a. m.  Qu?? cambi??: cu??ndo debe tomarlo  X        melatonin 3 mg Tab  Tome 1 tableta (3 mg total)  por v??a oral todas las noches.  (Tome 1 tableta (3 mg total) por v??a oral todas las noches).  ??ltima vez que se lo dieron: 3 mg. el 28 de octubre de 2020 9:09 p. m.    X     senna 8.6 mg tablet  Com??nmente conocido como: SENNA  Tome 2 tabletas por v??a oral a diario con el almuerzo.  ??ltima vez que se lo dieron: 2 tabletas el 28 de octubre de 2020 12:17 p.m.  X       sulfamethoxazole-trimethoprim 800-160 mg per tablet  Com??nmente conocido como: BACTRIM DS  Tome 1 tableta (160 mg total de trimethoprim) por v??a oral dos (2) veces al d??a.  (Tome 1 tableta (160 mg total de trimethoprim) por v??a oral dos (2) veces al d??a). X   X     traMADoL 50 mg tablet  Com??nmente conocido como: ULTRAM  Tome 1/2 tableta (25 mg) por v??a oral cada seis (6) horas seg??n necesite hasta por 5 d??as.  ??ltima vez que se lo dieron: 25 mg. el 24 de octubre de 2020 9:52 p. m.     X       D??nde obtener los medicamentos     Estos medicamentos se pueden recoger en [facility] CENTRAL OUT-PT PHARMACY     aspirin ??? atorvastatin ??? bictegrav-emtricit-tenofov ala ??? bisacodyL ??? docusate sodium ??? econazole nitrate ??? griseofulvin ??? melatonin ??? senna ??? sulfamethoxazole-trimethoprim ??? traMADoL  Direcci??n: [address] [place] [address]   Horario: 7 a. m. a 8 p. m. de lunes a viernes, de 8:30 a. m. a 4 p. m., s??bados y domingos (solo para las altas)   Tel??fono: [phone]       MyChart Enviar mensajes a su m??dico, revisar los resultados de sus pruebas m??dicas, renovar sus recetas, hacer citas y mucho m??s.     Vaya a [email or web address] clic en Use Your Activation Code. Escriba su c??digo de activaci??n de My [place] Chart exactamente como aparece a continuaci??n junto con su fecha de nacimiento para completar el proceso de activaci??n.       My [place] Chart Activation Code: [id number]  Expira: 30 de noviembre de 2020 10:29 a. m.     Si necesita ayuda con MY [place] Chart, llame a [facility] al [phone].        Care Everywhere CEID  [place]-[id number] : Este n??mero de identificaci??n se puede usar si otra instalaci??n m??dica que [employer] el programa Epic necesita solicitar el expediente m??dico de [place].    Informaci??n de recursos ante una crisis:    L??neas directas nacionales de prevenci??n del suicidio:       1-800-SUICIDE 941-076-0248 en espa??ol o 1-800-273-TALK 6714699152) en ingl??s     L??nea de atenci??n ante Neomia Dear crisis en [place]:       [phone]            Lamin Chandley N??m. de expediente: [phone]     CSN: [id number]   SA: [facility] SERVICE AREA Report:-IP After Visit Summary      Al Sallye Ober, yo reconozco que recib?? y entiendo las instrucciones del alta precedentes y materiales educativos para el paciente adjuntos (si los hay).   By signing below, I acknowledge that I have received and understand the foregoing discharge instructions and accompanying patient education materials (if any).    Firma del paciente/representante autorizado/adulto responsable  Signature of Patient/Authorized Representative/Responsible Adult    Nombre en letra de imprenta y relaci??n con el  paciente  Printed Name and Relationship to Patient    Franco Nones y hora  Date and Time    Firma de la enfermera u otro proveedor   Public house manager of Nurse or Other Provider    Nombre en letra de imprenta y credenciales   Printed Name and Credentials    Fecha y hora   Date and Time

## 2019-02-20 ENCOUNTER — Encounter: Admit: 2019-02-20 | Discharge: 2019-03-21 | Payer: PRIVATE HEALTH INSURANCE

## 2019-02-20 ENCOUNTER — Inpatient Hospital Stay: Admit: 2019-02-20 | Discharge: 2019-03-21 | Payer: PRIVATE HEALTH INSURANCE

## 2019-03-01 ENCOUNTER — Ambulatory Visit: Admit: 2019-03-01 | Discharge: 2019-03-02 | Payer: PRIVATE HEALTH INSURANCE

## 2019-03-01 ENCOUNTER — Encounter: Admit: 2019-03-01 | Discharge: 2019-03-02 | Payer: PRIVATE HEALTH INSURANCE

## 2019-03-01 DIAGNOSIS — B589 Toxoplasmosis, unspecified: Principal | ICD-10-CM

## 2019-03-01 DIAGNOSIS — B582 Toxoplasma meningoencephalitis: Principal | ICD-10-CM

## 2019-03-01 DIAGNOSIS — B20 Human immunodeficiency virus [HIV] disease: Principal | ICD-10-CM

## 2019-03-01 LAB — BASIC METABOLIC PANEL
ANION GAP: 8 mmol/L (ref 7–15)
BLOOD UREA NITROGEN: 13 mg/dL (ref 7–21)
CALCIUM: 9.8 mg/dL (ref 8.5–10.2)
CHLORIDE: 103 mmol/L (ref 98–107)
CO2: 28 mmol/L (ref 22.0–30.0)
EGFR CKD-EPI AA MALE: >90 mL/min/{1.73_m2} (ref >=60)
EGFR CKD-EPI NON-AA MALE: >90 mL/min/{1.73_m2} (ref >=60)
GLUCOSE RANDOM: 112 mg/dL (ref 70–179)
POTASSIUM: 4.4 mmol/L (ref 3.5–5.0)
SODIUM: 139 mmol/L (ref 135–145)

## 2019-03-01 LAB — CBC W/ AUTO DIFF
BASOPHILS ABSOLUTE COUNT: 0.1 10*9/L (ref 0.0–0.1)
BASOPHILS RELATIVE PERCENT: 1 %
EOSINOPHILS ABSOLUTE COUNT: 0.5 10*9/L — ABNORMAL HIGH (ref 0.0–0.4)
EOSINOPHILS RELATIVE PERCENT: 8.5 %
HEMATOCRIT: 42.8 % (ref 41.0–53.0)
HEMOGLOBIN: 14.1 g/dL (ref 13.5–17.5)
LARGE UNSTAINED CELLS: 3 % (ref 0–4)
LYMPHOCYTES ABSOLUTE COUNT: 1.9 10*9/L (ref 1.5–5.0)
LYMPHOCYTES RELATIVE PERCENT: 33.1 %
MEAN CORPUSCULAR HEMOGLOBIN CONC: 32.8 g/dL (ref 31.0–37.0)
MEAN CORPUSCULAR HEMOGLOBIN: 29.1 pg (ref 26.0–34.0)
MEAN CORPUSCULAR VOLUME: 88.7 fL (ref 80.0–100.0)
MONOCYTES ABSOLUTE COUNT: 0.4 10*9/L (ref 0.2–0.8)
MONOCYTES RELATIVE PERCENT: 7.2 %
NEUTROPHILS ABSOLUTE COUNT: 2.7 10*9/L (ref 2.0–7.5)
NEUTROPHILS RELATIVE PERCENT: 47.3 %
PLATELET COUNT: 278 10*9/L (ref 150–440)
RED BLOOD CELL COUNT: 4.83 10*12/L (ref 4.50–5.90)
WBC ADJUSTED: 5.7 10*9/L (ref 4.5–11.0)

## 2019-03-01 LAB — BILIRUBIN TOTAL: Bilirubin:MCnc:Pt:Ser/Plas:Qn:: 0.6

## 2019-03-01 LAB — LYMPHOCYTES RELATIVE PERCENT: Lymphocytes/100 leukocytes:NFr:Pt:Bld:Qn:Automated count: 33.1

## 2019-03-01 LAB — ALT (SGPT): Alanine aminotransferase:CCnc:Pt:Ser/Plas:Qn:: 27

## 2019-03-01 LAB — AST (SGOT): Aspartate aminotransferase:CCnc:Pt:Ser/Plas:Qn:: 27

## 2019-03-01 LAB — CALCIUM: Calcium:MCnc:Pt:Ser/Plas:Qn:: 9.8

## 2019-03-01 MED ORDER — ECONAZOLE 1 % TOPICAL CREAM
Freq: Two times a day (BID) | TOPICAL | 0 refills | 0 days | Status: CP
Start: 2019-03-01 — End: 2020-02-29

## 2019-03-01 MED ORDER — ATORVASTATIN 20 MG TABLET: 20 mg | tablet | Freq: Every day | 11 refills | 30 days | Status: AC

## 2019-03-01 MED ORDER — SULFAMETHOXAZOLE 800 MG-TRIMETHOPRIM 160 MG TABLET
ORAL_TABLET | Freq: Two times a day (BID) | ORAL | 5 refills | 30 days | Status: CP
Start: 2019-03-01 — End: 2019-08-28
  Filled 2019-03-22: qty 60, 30d supply, fill #0

## 2019-03-01 MED ORDER — BICTEGRAVIR 50 MG-EMTRICITABINE 200 MG-TENOFOVIR ALAFENAM 25 MG TABLET
ORAL_TABLET | Freq: Every day | ORAL | 11 refills | 30.00000 days | Status: CP
Start: 2019-03-01 — End: 2020-02-29
  Filled 2019-03-09: qty 30, 30d supply, fill #0

## 2019-03-01 MED ORDER — DOCUSATE SODIUM 100 MG CAPSULE
ORAL_CAPSULE | Freq: Three times a day (TID) | ORAL | 0 refills | 30 days | Status: CP
Start: 2019-03-01 — End: 2019-03-31
  Filled 2019-03-22: qty 90, 30d supply, fill #0

## 2019-03-01 MED ORDER — ATORVASTATIN 20 MG TABLET
ORAL_TABLET | Freq: Every day | ORAL | 11 refills | 30.00000 days | Status: CP
Start: 2019-03-01 — End: 2019-03-01
  Filled 2019-03-22: qty 30, 30d supply, fill #0

## 2019-03-01 MED ORDER — GRISEOFULVIN ULTRAMICROSIZE 125 MG TABLET
ORAL_TABLET | Freq: Every day | ORAL | 4 refills | 30 days | Status: CP
Start: 2019-03-01 — End: 2019-07-29
  Filled 2019-03-23: qty 90, 30d supply, fill #0

## 2019-03-01 MED ORDER — MELATONIN 3 MG TABLET
ORAL_TABLET | Freq: Every evening | ORAL | 11 refills | 30.00000 days | Status: CP
Start: 2019-03-01 — End: 2020-02-29
  Filled 2019-03-22: qty 30, 30d supply, fill #0

## 2019-03-01 MED ORDER — SENNOSIDES 8.6 MG TABLET
ORAL_TABLET | Freq: Every day | ORAL | 0 refills | 30.00000 days | Status: CP
Start: 2019-03-01 — End: 2019-03-31
  Filled 2019-03-22: qty 60, 30d supply, fill #0

## 2019-03-01 MED ORDER — ASPIRIN 81 MG CHEWABLE TABLET
ORAL_TABLET | Freq: Every day | ORAL | 11 refills | 30.00000 days | Status: CP
Start: 2019-03-01 — End: 2020-02-29
  Filled 2019-03-22: qty 36, 36d supply, fill #0

## 2019-03-01 MED ORDER — BISACODYL 10 MG RECTAL SUPPOSITORY
Freq: Every evening | RECTAL | 0 refills | 30.00000 days | Status: CP
Start: 2019-03-01 — End: 2019-03-31
  Filled 2019-03-23: qty 24, 24d supply, fill #0

## 2019-03-01 NOTE — Unmapped (Signed)
Assessment/Plan:       1. HIV  Doing well on Biktarvy. Unfortunately no genotyping done when he was viremic prior to ART initiation in the hospital, but he did have a demonstrated great response to tivicay/descovy.    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 s/p 6 weeks of induction therapy with good response of CNS lesion in followup imaging on 9/21. He was switched to bactrim after his initial 6 weeks of treatment due to insurance reasons, to continue on secondary ppx. Until CD4>200 for at least 6 months.  Continue Bactrim 1 DS PO BID x 6 months at least  Safety labs (BMP, LFTs) today  MRI brain w/wo contrast within next month to assess for ongoing response to therapy    3. 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 Significantly improved today on exam no active ischemia or swelling to suggest infection.   Continue statin and ASA  He will need outpatient follow up in hand clinic (scheduled for 11/18)    4. Tinea pedis: severe tinea pedis of LE b/l treated with griseo PO and topical by derm. Recommending 6 months of treatment (started September)    5. Sexual Health/STI: monogomous with wife,no recent activity.  Hepatitis status   Hep A NR 11/2018   Hep B needs vaccination   HCV NR 11/2018  RPR: NR  GC/CT: no indication today      Prevention and health education  PCP for patient is: needs to establish PCP  Reproductive Health:  Malignancy: anal pap smear na CV Health:The 10-year ASCVD risk score Denman George DC Montez Hageman., et al., 2013) is: 3% started on statin due to evidence of atherosclerotic disease on vascular studies performed for Dry gangrene of fingers: ASA 81 daily and statin   HgbA1c: 5.5 on 12/2018   Lipids: done 12/2018, ok LDL 103 chol 177 HDL 24  TB status:    Quantiferon/PPD: neg 11/2018  Immunization: incomplete record will start vaccinaion series at next visit   PCV13, PPSV23 (>8 weeks later)    Influenza done in house 2020 fall   tdap   HAV, HepB    Follow-up  Return to clinic 6 weeks or sooner if needed.      Truddie Hidden, MD, Pager: [phone]  [facility]   1st floor [facility]   [address]   [place], [place]. [address]   Phone: [phone]   Fax: [phone]            Subjective:      Chief Complaint    Follow-up (Hosptial d/c 3 weeks)  HIV    HPI  History of Present Illness:      Don [name] is a 43 y.o. male being evaluated for new patient to establish care for HIV.    43 yo with a h/o longstanding untreated HIV who was recently admitted in August 2020 for new onset LE weakness and found to have COVID without respiratory symptoms and transverse myelitis and new brain lesion. Imaging and CSF diagnostics c/w CNS toxo. Although his brain lesion was not typical for toxo (not rim enhancing) and CSF was also EBV+ (cytology neg x 3), he did  have a response on imaging on 9/21 to therapy with Sulfadiazine, pyrimethamine, and leucovorin (started 8/24).   Unfortunately it was felt his spinal cord injury from the transverse myelitis was irreversible, so he was ultimately discharged to rehab for ongoing intensive PT/OT.His course while in AIR at [facility] was c/b penile HSV as well as a catheter associated UTI with klebsiella, but otherwise he did quite well there. He was also treated for strong Ab+ and eosinophilia with ivermectin. He was discharged from [facility] on 10/29 on Biktarvy and, due to insurance reasons, after comleting induction therapy for CNS toxo he was switched to bactrim for home use.      Since getting home he has been doing well adjusting to being ain a wheelchair. His wife is helping him.    Rash on arms is better than when he first came in. No missed doses of biktarvy or Bactrim. No n/v/diarrhea. No weakness/numbness/tingling in the arms.   He has not yet scheduled an appointment with a PCP.    HIV Treatment history:  Diagnosed 2007 no treatment  Started on tivicay/descovy in hospital 11/2018-discharged on biktarvy    Review of Systems:  A 12 system review of systems was negative except as noted in HPI.    Medical History:  Past Medical History:   Diagnosis Date   ??? HIV (human immunodeficiency virus infection) (CMS-HCC)        Surgical History:  Past Surgical History:   Procedure Laterality Date   ??? PR BX/REMV,LYMPH NODE,DEEP AXILL Left 12/31/2018    Procedure: BX/EXC LYMPH NODE; OPEN, DEEP AXILRY NODE;  Surgeon: [name], MD;  Location: MAIN OR [facility];  Service: Surgical Oncology       Allergies and Medications  Current Therapy/ Therapy history:  BIktarvy  Bactrim  Other medications reviewed.     Reviewed and updated today. See bottom of this visit's encounter summary for details.    Allergies:  Patient has no known allergies.      Social History  Lives with wife and 2 young daughters  No tobacco, illicit drug use    Family History  Reviewed and updated today. See bottom of this visit's encounter summary for details.    Immunizations:  Immunization History   Administered Date(s) Administered   ??? Influenza Vaccine Quad (IIV4 PF) 20mo+ injectable 01/22/2019       Review of Systems  As per HPI. Remainder of 10 systems reviewed, negative.        Objective:      Objective BP 115/70 (BP Site: L Arm, BP Position: Sitting, BP Cuff Size: Medium)  - Pulse 90  - Temp 36.8 ??C (98.3 ??F) (Oral)  - Wt 63.5 kg (140 lb)  - BMI 23.30 kg/m??     GEN: in nad, in wheelchair   HEEN: sclerae anicteric EOMI  CV: rrr no m/r/g  Lungs: cta b/l  GI: soft nt nd  Ext: left hand with improved duskiness of fingers, 3rd digit with autoamputation wound at DIP healing, no active warmth/erythema/draiange  Skin: healing papules UE b/l and excoriations        Microbiology:    See HPI    Imaging:   MRI 9/21 brain  IMPRESSION:  Decreased size of enhancing lesion in the right corona radiata, with slightly increased surrounding edema and/or gliosis    MRI spine 8/28  IMPRESSION:  Compared to 12/11/2018, there has been little interval change, with persistent increased T2 signal in the spinal cord from the level  of T10 to the conus medullaris, predominantly involving the central portions of the cord, as well as focal T2 hyperintensity within the cord at T10 and cord enhancement at T12.   ??  Multilevel degenerative changes are similar to prior as described above. Disc bulge and facet arthrosis at L5-S1 results in severe left and moderate right neural foraminal narrowing

## 2019-03-01 NOTE — Unmapped (Signed)
Met with pt in the clinic. Pt is a new with our clinic. Rx were sent to [facility].     Application is incomplete, still need: proof of home residence and 2 pay stubs from spouse's job.     Helped pt activate a copay for for biktarvy. Provided pt a copay of this card. Asked him to give them this information whenever they call to fill.     Provided pt with a Curator. Pt states that he had submitted one long a while ago, but it had gotten pended for some missing documents. Encouraged pt submit another bc it can help with the outstanding balance.     Sherene Sires    Time Duration of intervention in minutes: 15 mins

## 2019-03-01 NOTE — Unmapped (Signed)
[facility] Specialty Medication Onboarding    Specialty Medication: BIKTARVY  Prior Authorization: Not Required   Financial Assistance: No - copay  <$25  Final Copay/Day Supply: $0 / 30 DAYS    Insurance Restrictions: None     Notes to Pharmacist: ALL OTHER MEDS SENT IN ON 03/01/19 ARE REFILL TOO SOON UNTIL 03/12/19.     The triage team has completed the benefits investigation and has determined that the patient is able to fill this medication at [facility]. Please contact the patient to complete the onboarding or follow up with the prescribing physician as needed.

## 2019-03-02 LAB — LYMPH MARKER LIMITED,FLOW
ABSOLUTE CD3 CNT: 1740 {cells}/uL (ref 915–3400)
ABSOLUTE CD8 CNT: 1305 {cells}/uL (ref 180–1520)
CD3% (T CELLS)": 84 % (ref 61–86)
CD4% (T HELPER)": 20 % — ABNORMAL LOW (ref 34–58)
CD8% T SUPPRESR": 63 % — ABNORMAL HIGH (ref 12–38)

## 2019-03-02 LAB — CD4% (T HELPER)": Cells.CD3+CD4+/100 cells:NFr:Pt:Bld:Qn:: 20 — ABNORMAL LOW

## 2019-03-02 NOTE — Unmapped (Signed)
Return apt: 04/12/2019     Provider: Dr. Truddie Hidden    Case Manager: Bradly Bienenstock, LCSWA/Jonathan Alvester Morin, MSW Intern      Social Work Assessment    SW met with pt and his wife today following new appointment with Dr. Dolores Frame.  Pt was not initially scheduled for new patient orientation due to scheduling error.  SW was unable to complete full assessment for this reason.  SW assessed pt's most pressing needs and needs will continue to be assessed at next appointment.    Duration of Intervention: 30 Minutes    History of HIV:   ? Diagnosis Date/Location: According to previous hospital admission, pt was diagnosed in 2007 and declined ART at the time    ? Previous Provider: N/A  o Last Appt and Lab Values: CD4 was 142 on admission    ? Previous/Current HIV regimen: Biktarvy  o Treatment Adherence History: According to hospitalization notes, pt declined ART at diagnosis due to religions reasons. Pt and SW dis not discuss treatment adherence today  o Barriers to Adherence: Access to medications/religious beliefs  ? 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  He also had finger on left hand amputated during this hospital stay      Non-HIV Medial Concerns:   ? General History: SW did not assess today    ? Primary Care Provider: N/A    o Current specialty providers? Yes.Pt referred to [facility] PT/OT to help with mobility and ADLs. He sees [facility] for ortho and urology as well    o Need referral to other specialties? No.    o Family Planning Goals: Pt has 2 daughters ages 65 & 50, no plans for other children    ? Dental Needs/Insurance:  No. has it through wife's insurance    ? Vision Needs/Insurance: No.Has it through wife's insurance ? Need Assistance with ADLs? Yes. Pt is able to perform some ADLs but may need assistance with bathing, dressing, and position changes.  He is involved with [facility] currently.  They have also assessed his home for accessibility.  Pt reports that their bathroom door is not wide enough to accommodate his wheelchair and home health is aware.     Mental Health and Substance Use History   ENGAGEMENT: Patient presented a willingness to participate in treatment.  PARTICIPATION QUALITY:    Active and Cooperative  MOOD:  OK     AFFECT:  CONGRUENT and BLUNTED   MENTAL STATUS:     alert and oriented     Literacy Concerns: did not assess today.  Pt's primary langage is Spanish    ? PHQ-9 Score:   Did not complete today  ? GAD-7 Score:   Did not complete today  ? Tobacco Use History: Did not assess today  ? SAMISS Summary: (.SAMISS): Did not complete today  ? Mental Health History: Did not assess today  ? Current Mental Health Concerns: N/A      Social History    ? Describe housing situation (where do you live, who do you live with): Pt lives with wife and two daughters in a mobile home.  They have completed payment on the mobile home and currently pay lot rent only.  They are also responsible for utilities   o Ever any issues paying bills? Yes. since pt's illness he has not been able to work so their income was cut in half.  Pt and wife both  work at the same [employer] facility.  There are able to make ends meet, but barely at this time  o Does anyone help you pay bills? Yes.Pt has received assistance from family/friends ($50 here and there)    ? Employment/Education: Pt hopes to be able to gain more functionality as he is learning to manage life with paralysis    ? Transportation (discuss SafeRide if indicated): Pt currently has a reliable car to get to and from appointments and his wife is able to assist him with transportation.  SW did provide education on [place] program if needed. ? Health Insurance Status: Pt has BCBS through [employer] employer  o HMAP/RW Education: Yes  o Preferred Pharmacy: Did not discuss today    ? Other Financial Stress? Yes.  Having difficulty affording expenses due to loss of income     ? Access to Food:  o Food Stamps? No. Pt and wife currently receive WIC for their youngest daugther.  They are aware that they could qualify for FNS benefits, but have not had opportunity to do so since pt was recently released from the hospital . They plan to complete application in the near future.    ? History of Justice System Involvement/Legal Needs: N/A    ? Current Support System: Wife and close family/friends    o Family Composition: Did not discuss    o Religion/Spirituality: Did not discuss    o Friendships and Romantic Relationships:   ? Sexual Orientation: Heterosexual  ? Gender Identity/Pronouns: he/him/his  ? Prevention Education:  Did not discuss today    ? Experience of Stigma - did not discuss    ? Communication Preferences  o Calls: Yes.  ? Voicemail: Yes.  o Texting: No.  ? Texting Agreement signed? No.  ? Expiration Date  o Email: No.  o Social Media: No.    Community Case Management/Referrals Made Today: SW referred pt to [facility] Providers in [facility] for case management and financial assistance    Acuity Score: 17    Enrollment in [facility], MH, or SUD Services? SW did not enroll today as she will be leaving [facility] Clinic on 11/13.  SW will discuss pt's case with social work team re: case management needs.    MEDICAL CASE MANAGEMENT ACUITY SCALE  Note: If any of the following conditions apply, the acuity level is automatically 3 and the acuity must be reassessed in 90 days:   []   Released from a correctional facility within the past  90 days     []  Diagnosed with HIV in the last 180 days  []   Currently homeless   []   Pregnant    Knowledge & understanding of HIV as a medical diagnosis, transmission, & medications []  1-  Complete understanding of HIV disease process, transmission & medications  []  2- Periodic education of client on HIV disease process, transmission, and/or medications  [x]  3- Minimal knowledge of HIV, transmission, and/or medications  []  4- No knowledge of HIV, transmission risks, and/or medications    Basic Needs  []  1- Client is able to meet own basic needs. Client is able to access community assistance on their own as needed.  []  2- Occasional help to access assistance   [x]  3- Difficulty accessing assistance. Often w/o basics.   []  4- Has limited access to food. Without most basic needs.     Transportation:  [x]  1-  Has reliable transportation. Is able to cover costs of transportation, Bus tickets  []  2-  Needs occasional assistance < 2 times per year, Ride arrangement needed  []  3- No means. Under or un-served area for public transportation. Needs assistance 3-6 times per year.  []  4- Serious impact on access to medical care. Needs assistance > 7 times per year.    Health Insurance/medical care coverage  [x]  1- Has own medical insurance and payer. Able to access medical care.  []  2- Enrolled in medical care benefits program. Needs occasional assistance accessing medical care < 2 times per year.  []  3- Needs referral to access insurance or medical care benefits program. No medical crisis. Needs assistance accessing medical care 3-6 times per year.  []  4- Needs immediate assistance to access insurance or medical care benefits program. Medical crisis. Does not have access to medical care.    Self sufficiency  []  1- Independent. Can follow-up on referrals and can access services.  []  2- Sometimes requires assistance in follow-up and completing forms.  [x]  3- Difficulty with follow-up, completing forms and accessing services.  []  4- Never follows-up, unable to complete forms, burns bridges.    Housing/Living arrangement  []  1- Living in clean, habitable, stable housing. Does not need assistance. [x]  2- Stable housing subsidized or not. Occasionally needs assistance with paying for housing <2 times per year.  []  3- Unstable housing subsidized or not. Housing subsidy violation or eviction imminent. Frequently accesses housing assistance 3-6 times per year.  []  4- Unable to live independently. Recently evicted. Homeless. Temporary housing. Accesses assistance > 7 times per year.    Risk Behavior  [x]  1- Understand risks & practices harm reduction behavior.  []  2-  Poor understanding of risk and no exposure to high risk situations. Risks explained but continue to engage in risky behaviors.  []  3- Has poor knowledge and/or occasionally engages in risky behaviors.  []  4- Lacks knowledge and/or engages in significant risky behaviors.    Substance Use  [x]  1- No difficulties with substance use. No need for referral.  []  2-  Past problems-less than 1 yr. recovery, recurrent problems. Not impacting ability to pay bills or health.  []  3-  Current substance use ??? willing to seek help. Impacts ability to pay bills and access to medical care.  []  4- Current substance use ??? not willing to seek help. Unable to pay bills or maintain medical care because of addiction.    Dental   [x]  1- Has own medical insurance and payer. Able to access dental care.  []  2- Aware of dental services offered and requires assistance accessing dental care < 2 times per year, Referral needed.  []  3-  Needs information and  referral to access dental services. No dental crisis. Needs information or education on dental services available.  []  4- Needs immediate assistance to access dental care benefits program. Dental crisis. Does not have access to dental care.    Mental Health  [x]  1- No history of mental health problems. No need for referral  []  2-  Past problems and/or reports current difficulties/stress ??? is functioning or already engaged in mental health care. []  3- Experiencing severe difficulty in day-to-day functioning. Requires significant support. Needs referral to mental health care.  []  4- Danger to self or others, needs immediate intervention. Needs but not accessing therapy.    Total Points: 17    CC:

## 2019-03-03 DIAGNOSIS — G8222 Paraplegia, incomplete: Principal | ICD-10-CM

## 2019-03-03 LAB — HIV RNA, QUANTITATIVE, PCR

## 2019-03-03 LAB — HIV RNA: HIV 1 RNA:NCnc:Pt:Ser/Plas:Qn:Probe.amp.tar: 0

## 2019-03-03 NOTE — Unmapped (Signed)
Outpt Physical Therapy order faxed to [facility] @[phone].

## 2019-03-04 NOTE — Unmapped (Signed)
Social Work Referral Services Note    Duration of Intervention: 5 minutes    Pt called to reschedule home health appt tomorrow. Pt has to work in the afternoon so asked it be switched to 11am. SW sent message to the provider to see.     Lilli Few, CHES  [facility] Social Work   [phone]

## 2019-03-08 NOTE — Unmapped (Signed)
[facility] Clinical Assessment & Refill Coordination Note    Don [name], DOB: [birth year]  Phone: [phone] (home)     All above HIPAA information was verified with patient via Spanish interpreter: [name] [phone]     Specialty Medication(s):   Infectious Disease: Biktarvy     Current Outpatient Medications   Medication Sig Dispense Refill   ??? aspirin 81 MG chewable tablet Chew 1 tablet (81 mg total) daily. 30 tablet 11   ??? atorvastatin (LIPITOR) 20 MG tablet Take 1 tablet (20 mg total) by mouth daily. 30 tablet 11   ??? bictegrav-emtricit-tenofov ala (BIKTARVY) 50-200-25 mg tablet Take 1 tablet by mouth daily. 30 tablet 11   ??? bisacodyL (DULCOLAX) 10 mg suppository Insert 1 suppository (10 mg total) into the rectum every evening. 30 suppository 0   ??? docusate sodium (COLACE) 100 MG capsule Take 1 capsule (100 mg total) by mouth Three (3) times a day. 90 capsule 0   ??? econazole nitrate (SPECTAZOLE) 1 % cream Apply topically Two (2) times a day. 15 g 0   ??? griseofulvin (GRIS-PEG) 125 MG tablet Take 3 tablets (375 mg total) by mouth daily. 90 tablet 4   ??? melatonin 3 mg Tab Take 1 tablet (3 mg total) by mouth every evening. 30 tablet 11   ??? senna (SENNA) 8.6 mg tablet Take 2 tablets by mouth daily with lunch. 60 tablet 0   ??? sulfamethoxazole-trimethoprim (BACTRIM DS) 800-160 mg per tablet Take 1 tablet (160 mg of trimethoprim total) by mouth Two (2) times a day. 60 tablet 5     No current facility-administered medications for this visit.         Changes to medications: Storm reports no changes at this time.    No Known Allergies    Changes to allergies: No    SPECIALTY MEDICATION ADHERENCE     Biktarvy   : 7 days of medicine on hand       Medication Adherence    Patient reported X missed doses in the last month: 0  Specialty Medication: Biktarvy  Patient is on additional specialty medications: No  Informant: patient Specialty medication(s) dose(s) confirmed: Regimen is correct and unchanged.     Are there any concerns with adherence? No    Adherence counseling provided? Not needed    CLINICAL MANAGEMENT AND INTERVENTION      Clinical Benefit Assessment:    Do you feel the medicine is effective or helping your condition? Yes    Clinical Benefit counseling provided? Not needed    Adverse Effects Assessment:    Are you experiencing any side effects? No    Are you experiencing difficulty administering your medicine? No    Quality of Life Assessment:    How many days over the past month did your HIV  keep you from your normal activities? For example, brushing your teeth or getting up in the morning. 0    Have you discussed this with your provider? Not needed    Therapy Appropriateness:    Is therapy appropriate? Yes, therapy is appropriate and should be continued    DISEASE/MEDICATION-SPECIFIC INFORMATION      N/A    PATIENT SPECIFIC NEEDS     ? Does the patient have any physical, cognitive, or cultural barriers? No    ? Is the patient high risk? No     ? Does the patient require a Care Management Plan? No     ? Does the  patient require physician intervention or other additional services (i.e. nutrition, smoking cessation, social work)? No      SHIPPING     Specialty Medication(s) to be Shipped:   Infectious Disease: Biktarvy    Other medication(s) to be shipped: n/a     Changes to insurance: No    Delivery Scheduled: Yes, Expected medication delivery date: 03/10/2019.     Medication will be delivered via Next Day Courier to the confirmed prescription address in [facility].    The patient will receive a drug information handout for each medication shipped and additional FDA Medication Guides as required.  Verified that patient has previously received a Conservation officer, historic buildings.    All of the patient's questions and concerns have been addressed.    Roderic Palau   [facility] Specialty Pharmacist

## 2019-03-09 MED ORDER — ECONAZOLE 1 % TOPICAL CREAM
Freq: Every day | TOPICAL | 0 refills | 0 days | Status: CP
Start: 2019-03-09 — End: 2020-03-08
  Filled 2019-03-23: qty 85, 30d supply, fill #0

## 2019-03-09 MED FILL — BIKTARVY 50 MG-200 MG-25 MG TABLET: 30 days supply | Qty: 30 | Fill #0 | Status: AC

## 2019-03-15 ENCOUNTER — Ambulatory Visit: Admit: 2019-03-15 | Discharge: 2019-03-16 | Payer: PRIVATE HEALTH INSURANCE

## 2019-03-15 DIAGNOSIS — I96 Gangrene, not elsewhere classified: Principal | ICD-10-CM

## 2019-03-15 MED ORDER — NITROGLYCERIN 2 % TRANSDERMAL OINTMENT
Freq: Three times a day (TID) | TRANSDERMAL | 0 refills | 20.00000 days | Status: CP | PRN
Start: 2019-03-15 — End: 2020-03-14
  Filled 2019-03-22: qty 30, 20d supply, fill #0

## 2019-03-15 NOTE — Unmapped (Signed)
Apply nitroglycerin cream to affected fingers every 8 hours as needed.  Work on range of motion of the hand  If you develop any worsening redness, warmth, fevers, chills please seek medical attention immediately    Thank you for choosing [facility]!  We appreciate the opportunity to participate in your care.      If any questions or concerns arise after your visit, please do not hesitant to contact me by [facility] or by calling my clinical assistant, Denise at [phone].      Voicemail messages: Messages are checked between 8:00 am- 4:00 pm Monday- Friday.    MyChart messages: These messages are checked by the nurses during normal business hours 8:30 am-4:30 pm Monday-Friday every 24-48 hours and are for non-urgent, non-emergent concerns. You may be asked to return for a follow up visit if it is deemed your questions are best handled in the clinic setting.    Please let me know if I can be of assistance with this or other orthopaedic issues in the future.

## 2019-03-16 NOTE — Unmapped (Signed)
[facility]  [employer]. 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  Left index finger has small ulceration at the tip of the distal phalanx which is healing appropriately with no evidence of surrounding infection.  I encourage patient to continue working on range of motion of the fingers and have prescribed patient nitro cream which he will use 2-3 times daily as needed.  He will follow-up in the clinic in approximately 4 to 6 weeks for repeat wound check and evaluation.  He was instructed if there is any worsening fevers, chills, purulent drainage, redness, worsening ulceration, warmth he should seek medical attention immediately.  He is in agreement with this plan  We will proceed with the following treatment plan:  1. Medications: Nitro cream, oral Tylenol as needed for pain  2. DME/Cast: none  3. PT/OT: none  4. Injections: none  5.   Follow-up: Return in about 4 weeks (around 04/12/2019).   6.   X-rays at next visit: Left index and long fingers    SUBJECTIVE:  Chief Complaint: Left hand ED follow-up  History of Present Illness: Don [name] is a 43 y.o. right hand dominant  male  who presents for evaluation of left hand dry gangrene that was treated while patient was inpatient on 12/10/2018.  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  Patient underwent extensive work-up while in the emergency department to cause of left hand dry gangrene and consultations included rheumatology, vascular surgery, hematology, orthopedics and it was ultimately decided that because of dry gangrene with likely early atherosclerotic disease.  Patient was discharged on aspirin and a statin and notes gradual improvement of left index and long finger dry gangrene.  He notes further autoamputation of the left long finger and denies any further ulcers, fevers, chills, purulent drainage, or warmth.  He keeps the fingers covered at most times and occasionally notes pain at the tip of the left index finger where ulceration is present.  He denies applying any creams to the left upper extremity at this time and denies taking any specific medications for pain.  He presents the clinic today with his wife who states symptoms are significantly improving about the left hand.  A Spanish interpreter was present for the entirety of the visit  Pain Score: 3/10  Quality: Throbbing and Aching  Medications Tried: OTC Tylenol  Modifying factors: Better with rest and Worse with activities  Associated Signs: Denies any numbness and tingling    Medical History Past Medical History:   Diagnosis Date   ??? HIV (human immunodeficiency virus infection) (CMS-HCC)       Surgical History Past Surgical History:   Procedure Laterality Date   ??? PR BX/REMV,LYMPH NODE,DEEP AXILL Left 12/31/2018    Procedure: BX/EXC LYMPH NODE; OPEN, DEEP AXILRY NODE;  Surgeon: [name], MD;  Location: MAIN OR [facility];  Service: Surgical Oncology      Medications   Current Outpatient Medications: ???  aspirin 81 MG chewable tablet, Chew 1 tablet (81 mg total) daily., Disp: 30 tablet, Rfl: 11  ???  atorvastatin (LIPITOR) 20 MG tablet, Take 1 tablet (20 mg total) by mouth daily., Disp: 30 tablet, Rfl:  11  ???  bictegrav-emtricit-tenofov ala (BIKTARVY) 50-200-25 mg tablet, Take 1 tablet by mouth daily., Disp: 30 tablet, Rfl: 11  ???  bisacodyL (DULCOLAX) 10 mg suppository, Insert 1 suppository (10 mg total) into the rectum every evening., Disp: 30 suppository, Rfl: 0  ???  docusate sodium (COLACE) 100 MG capsule, Take 1 capsule (100 mg total) by mouth Three (3) times a day., Disp: 90 capsule, Rfl: 0  ???  econazole nitrate (SPECTAZOLE) 1 % cream, Apply topically Two (2) times a day., Disp: 15 g, Rfl: 0  ???  econazole nitrate (SPECTAZOLE) 1 % cream, Apply topically daily., Disp: 85 g, Rfl: 0  ???  griseofulvin (GRIS-PEG) 125 MG tablet, Take 3 tablets (375 mg total) by mouth daily., Disp: 90 tablet, Rfl: 4  ???  melatonin 3 mg Tab, Take 1 tablet (3 mg total) by mouth every evening., Disp: 30 tablet, Rfl: 11  ???  senna (SENNA) 8.6 mg tablet, Take 2 tablets by mouth daily with lunch., Disp: 60 tablet, Rfl: 0  ???  sulfamethoxazole-trimethoprim (BACTRIM DS) 800-160 mg per tablet, Take 1 tablet (160 mg of trimethoprim total) by mouth Two (2) times a day., Disp: 60 tablet, Rfl: 5  ???  nitroglycerin (NITROSTAT) 2 % ointment, Place 0.5 inches on the skin every eight (8) hours as needed., Disp: 30 g, Rfl: 0   Allergies Patient has no known allergies.     Social History Social History     Tobacco Use   ??? Smoking status: Never Smoker   ??? Smokeless tobacco: Never Used   Substance Use Topics   ??? Alcohol use: Not Currently   ??? Drug use: Not Currently        Family History family history includes Alcohol abuse in his brother; Diabetes in his sister; Kidney disease in his sister; Mental illness in an other family member. Review of Systems 12 point review of systems were obtained in clinic and all pertinent postives/negatives were documented in the HPI.     OBJECTIVE:  Physical Exam:  General Appearance ?? well-nourished and no acute distress   Mood and Affect ?? alert, cooperative and pleasant   Gait and Station ?? neutral standing alignment   Cardiovascular ?? well-perfused distally and no swelling   Sensation ?? Sensation intact to light touch distally  ?? in the bilateral median, ulnar, and radial nerve distribution    MUSCULOSKELETAL    Right upper extremity ?? Inspection: Skin clean, dry, and intact  ?? Skin: No laceration, abrasion, ecchymosis, or other skin abnormality  ?? Tenderness: Nontender about the wrist/hand diffusely  ?? ROM: Able to make a full fist and bring fingers to full extension  ?? Stability Exam: No evidence of instability of the wrist  ?? Strength: 5/5 grip strength    Left upper extremity ?? Inspection: Evidence of auto amputation toe approximately long middle phalanx with healing eschar at the tip of the finger.  Evidence of superficial ulceration at the tip of the index finger with no surrounding erythema, warmth, purulent drainage.  No evidence of bony exposure  ?? Tenderness: Nontender about the long finger diffusely.  Minimally tender along the index finger distal phalanx Index and long finger reach approximately 3 cm from distal palmar crease.  ?? Stability Exam: No instability of the fingers  ?? Strength: 4+/5 grip strength      Test Results  Imaging  Xrays of patients left index and long fingers were obtained and independently interpreted myself which reveal evidence of amputation of the  long finger at the level of the middle phalanx.  No evidence of osteomyelitis noted

## 2019-03-22 ENCOUNTER — Encounter: Admit: 2019-03-22 | Discharge: 2019-03-24 | Payer: PRIVATE HEALTH INSURANCE

## 2019-03-23 MED FILL — ECONAZOLE 1 % TOPICAL CREAM: 30 days supply | Qty: 85 | Fill #0 | Status: AC

## 2019-03-23 MED FILL — GENTLE LAXATIVE (BISACODYL) 10 MG RECTAL SUPPOSITORY: 24 days supply | Qty: 24 | Fill #0 | Status: AC

## 2019-03-23 MED FILL — GRISEOFULVIN ULTRAMICROSIZE 125 MG TABLET: 30 days supply | Qty: 90 | Fill #0 | Status: AC

## 2019-03-25 ENCOUNTER — Ambulatory Visit
Admit: 2019-03-25 | Discharge: 2019-03-26 | Payer: PRIVATE HEALTH INSURANCE | Attending: Spinal Cord Injury Medicine | Primary: Spinal Cord Injury Medicine

## 2019-03-25 DIAGNOSIS — B582 Toxoplasma meningoencephalitis: Principal | ICD-10-CM

## 2019-03-25 DIAGNOSIS — G8222 Paraplegia, incomplete: Principal | ICD-10-CM

## 2019-03-25 MED ORDER — GABAPENTIN 100 MG CAPSULE
ORAL_CAPSULE | Freq: Three times a day (TID) | ORAL | 2 refills | 30 days | Status: CP
Start: 2019-03-25 — End: ?
  Filled 2019-05-17: qty 90, 30d supply, fill #0

## 2019-03-25 NOTE — Unmapped (Addendum)
?   Thank you for visiting today, you have been doing well since your last visit.  ? Please reach out to [place] via phone: [phone] or MyChart message with any questions.  ? We have started you on a new medication for nerve pain and that tightening sensation. (Gabapentin) Please let us know in ~2 weeks if it is helping so we can decide on going up on the dose or staying at the current dose.

## 2019-03-25 NOTE — Unmapped (Signed)
PHYSICAL MEDICINE & REHABILITATION  Clinic Note         Patient Don [name]  MRN: [id number]  DOB: 1976-03-12  Age: 43 y.o.   Encounter Date: 03/25/2019     ASSESSMENT:     Patient is a 43 y.o. male seen in the SCI clinic for follow-up of ~T10        RECOMMENDATIONS     1. Bowel:  Doing well with 3-2-1, BP, few accidents but we will continue to monitor.    2. Bladder: I/O Cathing Q6H with good volumes, will continue that.    3. Spasticity: No increased tone on exam today, no clonus, will hold off on antispasmodics today.    4. Skin: No skin issues.    5. Neuropathic pain: Patients tightening sensation and hot/cold sensation is likely neuropathic, for this reason we will start gabapentin 100mg  TID.    6. Equipment: Waiting for permanent chair at this time. No needs at this time.    7. Therapy: Continue outpatient physical therapy.         Follow up: Return in about 3 months (around 06/23/2019).      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 male  seen as hospital follow up for spinal cord injury.    SCI Injury/level: T-10     Interval History: Patient reports he has been doing well since the hospitalization.    1. Bowel: Patient is using the 3-2-1 w/ DRS, has rarely had episodes of incontinence between programs, usually before program. 1x per week.     2. Bladder: Using I/O cath every 6 hours, without symptoms, 300-568mL cath volumes.    3. Spasticity: Constant spasms in the bilateral legs, which are bothersome to him. Not getting in the way of transfers.      4. Skin: No issues at this time.    5. Neuropathic pain: Does not complain of neuropathic pain at this time. Does have some sensations of cold/hot that happens infrequency, but is not that bothersome to him.     6. Equipment: Manual wheelchair is in good working order, but is waiting on his permanent chair. No other equipment issues. 7. Therapy:  Just completed in home therapy. Will start outpatient physical [facility].      Functional History:   Selfcare/feeding: Independent  Dressing: Independent (needs help with shoes)  Transfers: Independent  Ambulation/Mobility: Independent in manual wheelchair  Bladder Management:BP is doing on his own  Bowel Management: I/O Cath independently.       Past Medical History:   Diagnosis Date   ??? HIV (human immunodeficiency virus infection) (CMS-HCC)      Past Surgical History:   Procedure Laterality Date   ??? PR BX/REMV,LYMPH NODE,DEEP AXILL Left 12/31/2018    Procedure: BX/EXC LYMPH NODE; OPEN, DEEP AXILRY NODE;  Surgeon: [name], MD;  Location: MAIN OR [facility];  Service: Surgical Oncology       Social History:  reports that he has never smoked. He has never used smokeless tobacco. He reports previous alcohol use. He reports previous drug use.     Family History: Patient denies significant family history.                  MEDICATIONS: Reviewed in EPIC. Pertinent as noted in history.    Allergies: Patient has no known allergies.  Review of Systems: 10 organ systems reviewed and pertinent as noted in HPI.         OBJECTIVE:     Physical Exam:   Vitals: Wt 63.5 kg (140 lb)  - BMI 23.30 kg/m??     General: No acute distress  Psychiatric: affect appropriate, thought process logical   HEENT: Normcephalic, atraumatic, sclera anicteric  Respiratory: No increased WOB, no retractions  Cardiac: Extremities warm and well profused  Abdomen: Soft, non-tender, non-distended    Musculoskeletal: No visible swelling/erythema  Neurologic:   Mental Status: Alert, naming intact, speech fluid and coherent, no aphasia apparent  and follows commands appropriately               Cranial Nerve: EOMI, PERRL, Hearing grossly intact, Eyebrow raise symmetric, Shows teeth symmetrically, Tongue protrudes midline and Shoulder shrug full and equal   Sensory: BUE intact and BLE absent Other: No clonus at BL ankles     Tone: MAS 1 throughout BL knees, hips, and ankles     Deltoid (C4) Biceps (C5) Wrist Extensors (C6) Triceps (C7) Finger Abduction (C8) Flexor Digitorum  (T1)   RUE 5 5 5 5 5 5    LUE 5 5 5 5 5 5       Hip Flexion (L2) Knee Extension (L3) Dorsiflexion (L4) EHL (L5) Plantar Flexion  (S1)   RLE 0 0 0 0 0   LLE 0 0 0 0 0        Review of Labs:  No new labs  Review of Imaging:  No new imagaing          Phillis Haggis, MD

## 2019-03-30 ENCOUNTER — Ambulatory Visit: Payer: BLUE CROSS/BLUE SHIELD | Attending: Physical Medicine and Rehabilitation

## 2019-04-06 NOTE — Unmapped (Signed)
[facility] Refill Coordination Note    Specialty Medication(s) to be Shipped:   Infectious Disease: Biktarvy    Other medication(s) to be shipped: none     Don [name], DOB: November 25, 1975  Phone: [phone] (home)       All above HIPAA information was verified with patient.     Was a Nurse, learning disability used for this call? Yes, spanish. Patient language is appropriate in [facility]    Completed refill call assessment today to schedule patient's medication shipment from the [facility] [phone]).       Specialty medication(s) and dose(s) confirmed: Regimen is correct and unchanged.   Changes to medications: Don [name] reports no changes at this time.  Changes to insurance: No  Questions for the pharmacist: No    Confirmed patient received Welcome Packet with first shipment. The patient will receive a drug information handout for each medication shipped and additional FDA Medication Guides as required.       DISEASE/MEDICATION-SPECIFIC INFORMATION        N/A    SPECIALTY MEDICATION ADHERENCE     Medication Adherence    Patient reported X missed doses in the last month: 0  Specialty Medication: biktarvy  Patient is on additional specialty medications: No                Biktarvy 50-200-25 mg: 10 days of medicine on hand          SHIPPING     Shipping address confirmed in Epic.     Delivery Scheduled: Yes, Expected medication delivery date: 04/13/19.     Medication will be delivered via Same Day Courier to the prescription address in Epic WAM.    Unk Lightning   [facility] Specialty Technician

## 2019-04-12 ENCOUNTER — Ambulatory Visit: Admit: 2019-04-12 | Discharge: 2019-04-13 | Payer: PRIVATE HEALTH INSURANCE

## 2019-04-12 DIAGNOSIS — B20 Human immunodeficiency virus [HIV] disease: Principal | ICD-10-CM

## 2019-04-12 LAB — BASIC METABOLIC PANEL
ANION GAP: 11 mmol/L (ref 7–15)
BLOOD UREA NITROGEN: 13 mg/dL (ref 7–21)
BUN / CREAT RATIO: 26
CALCIUM: 9.8 mg/dL (ref 8.5–10.2)
CHLORIDE: 96 mmol/L — ABNORMAL LOW (ref 98–107)
CO2: 25 mmol/L (ref 22.0–30.0)
CREATININE: 0.5 mg/dL — ABNORMAL LOW (ref 0.70–1.30)
EGFR CKD-EPI NON-AA MALE: >90 mL/min/{1.73_m2} (ref >=60)
GLUCOSE RANDOM: 333 mg/dL — ABNORMAL HIGH (ref 70–179)
POTASSIUM: 4.8 mmol/L (ref 3.5–5.0)
SODIUM: 132 mmol/L — ABNORMAL LOW (ref 135–145)

## 2019-04-12 LAB — CBC W/ AUTO DIFF
BASOPHILS ABSOLUTE COUNT: 0 10*9/L (ref 0.0–0.1)
EOSINOPHILS ABSOLUTE COUNT: 0.3 10*9/L (ref 0.0–0.4)
EOSINOPHILS RELATIVE PERCENT: 3.6 %
HEMATOCRIT: 43.6 % (ref 41.0–53.0)
HEMOGLOBIN: 15.1 g/dL (ref 13.5–17.5)
LARGE UNSTAINED CELLS: 1 % (ref 0–4)
LYMPHOCYTES ABSOLUTE COUNT: 1.4 10*9/L — ABNORMAL LOW (ref 1.5–5.0)
LYMPHOCYTES RELATIVE PERCENT: 16.8 %
MEAN CORPUSCULAR HEMOGLOBIN CONC: 34.6 g/dL (ref 31.0–37.0)
MEAN CORPUSCULAR HEMOGLOBIN: 29.8 pg (ref 26.0–34.0)
MEAN CORPUSCULAR VOLUME: 86.2 fL (ref 80.0–100.0)
MEAN PLATELET VOLUME: 10.4 fL — ABNORMAL HIGH (ref 7.0–10.0)
MONOCYTES ABSOLUTE COUNT: 0.5 10*9/L (ref 0.2–0.8)
MONOCYTES RELATIVE PERCENT: 6.1 %
NEUTROPHILS ABSOLUTE COUNT: 6.1 10*9/L (ref 2.0–7.5)
NEUTROPHILS RELATIVE PERCENT: 72.4 %
RED BLOOD CELL COUNT: 5.06 10*12/L (ref 4.50–5.90)
RED CELL DISTRIBUTION WIDTH: 14.1 % (ref 12.0–15.0)
WBC ADJUSTED: 8.4 10*9/L (ref 4.5–11.0)

## 2019-04-12 LAB — BILIRUBIN TOTAL: Bilirubin:MCnc:Pt:Ser/Plas:Qn:: 0.7

## 2019-04-12 LAB — BASOPHILS RELATIVE PERCENT: Basophils/100 leukocytes:NFr:Pt:Bld:Qn:Automated count: 0.5

## 2019-04-12 LAB — POTASSIUM: Potassium:SCnc:Pt:Ser/Plas:Qn:: 4.8

## 2019-04-12 LAB — ALT (SGPT): Alanine aminotransferase:CCnc:Pt:Ser/Plas:Qn:: 21

## 2019-04-12 LAB — AST (SGOT): Aspartate aminotransferase:CCnc:Pt:Ser/Plas:Qn:: 21

## 2019-04-12 MED ADMIN — gadobenate dimeglumine (MULTIHANCE) 529 mg/mL (0.1mmol/0.2mL) solution 14 mL: 14 mL | INTRAVENOUS | @ 19:00:00 | Stop: 2019-04-12

## 2019-04-13 LAB — LYMPH MARKER LIMITED,FLOW
ABSOLUTE CD3 CNT: 1232 {cells}/uL (ref 915–3400)
ABSOLUTE CD4 CNT: 312 {cells}/uL — ABNORMAL LOW (ref 510–2320)
ABSOLUTE CD8 CNT: 876 {cells}/uL (ref 180–1520)
CD3% (T CELLS)": 83 % (ref 61–86)
CD4% (T HELPER)": 21 % — ABNORMAL LOW (ref 34–58)
CD8% T SUPPRESR": 59 % — ABNORMAL HIGH (ref 12–38)

## 2019-04-13 LAB — CD4% (T HELPER)": Cells.CD3+CD4+/100 cells:NFr:Pt:Bld:Qn:: 21 — ABNORMAL LOW

## 2019-04-13 MED FILL — BIKTARVY 50 MG-200 MG-25 MG TABLET: ORAL | 30 days supply | Qty: 30 | Fill #1

## 2019-04-13 MED FILL — BIKTARVY 50 MG-200 MG-25 MG TABLET: 30 days supply | Qty: 30 | Fill #1 | Status: AC

## 2019-04-13 NOTE — Unmapped (Signed)
Assessment/Plan:       1. HIV  Doing well on Biktarvy. Unfortunately no genotyping done when he was viremic prior to ART initiation in the hospital, but he did have a demonstrated great response to tivicay/descovy.    Checkinglabs, including CD4, HIV RNA,chemistries,    2. 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 s/p 6 weeks of induction therapy with good response of CNS lesion in followup imaging on 9/21. He was switched to bactrim after his initial 6 weeks of treatment due to insurance reasons, to continue on secondary ppx. Until CD4>200 for at least 6 months. MRI today shows complete resolution of brain lesion-d/w patient.  Continue Bactrim 1 DS PO BID x 6 months at least  Safety labs (BMP, LFTs) today    3. 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 Significantly improved today on exam no active ischemia or swelling to suggest infection. Still some duskiness of 4th digit unchanged  Continue statin and ASA  Has ongoing ortho fu (next scheduled for 11/28)    4. Tinea pedis: severe tinea pedis of LE b/l treated with griseo PO and topical by derm. Recommending 6 months of treatment (started September)-much improved on exam today    5. Sexual Health/STI: monogomous with wife,no recent activity.  Hepatitis status   Hep A NR 11/2018   Hep B needs vaccination   HCV NR 11/2018  RPR: NR  GC/CT: no indication today      Prevention and health education  PCP for patient is: needs to establish PCP  Reproductive Health:  Malignancy: anal pap smear na  CV Health:The 10-year ASCVD risk score Denman George DC Montez Hageman., et al., 2013) is: 3.2% started on statin due to evidence of atherosclerotic disease on vascular studies performed for Dry gangrene of fingers: ASA 81 daily and statin HgbA1c: 5.5 on 12/2018   Lipids: done 12/2018, ok LDL 103 chol 177 HDL 24  TB status:    Quantiferon/PPD: neg 11/2018  Immunization: incomplete record will start vaccinaion series at next visit   PCV13, PPSV23 (>8 weeks later) : started today with prevnar   Influenza done in house 2020 fall   Tdap: given today   HAV, HepB    Follow-up  Return to clinic 2 months or sooner if needed.      Truddie Hidden, MD, Pager: [phone]  [facility]   1st floor [facility]   [address]   [place], [place]. [address]   Phone: [phone]   Fax: [phone]            Subjective:      Chief Complaint    Follow-up  HIV    HPI  History of Present Illness:      Don [name] is a 43 y.o. male being evaluated for new patient to establish care for HIV.    43 yo with a h/o longstanding untreated HIV who was recently admitted in August 2020 for new onset LE weakness and found to have COVID without respiratory symptoms and transverse myelitis and new brain lesion. Imaging and CSF diagnostics c/w CNS toxo. Although his brain lesion was not typical for toxo (not rim enhancing) and CSF was also EBV+ (cytology neg x 3), he did have a response on imaging on 9/21 to therapy  with Sulfadiazine, pyrimethamine, and leucovorin (started 8/24).   Unfortunately it was felt his spinal cord injury from the transverse myelitis was irreversible, so he was ultimately discharged to rehab for ongoing intensive PT/OT.His course while in AIR at [facility] was c/b penile HSV as well as a catheter associated UTI with klebsiella, but otherwise he did quite well there. He was also treated for strong Ab+ and eosinophilia with ivermectin.  He was discharged from [facility] on 10/29 on Biktarvy and, due to insurance reasons, after comleting induction therapy for CNS toxo he was switched to bactrim for home use.      Since getting home he has been doing well adjusting to being ain a wheelchair. His wife is helping him.    No missed doses of biktarvy or Bactrim. No n/v/diarrhea. Some intermittent weakness/numbness/tingling in the arms and legs b/l.     Trying to get plugged in to PT at [facility] but they called and left him a message in english so he could not understand it.    HIV Treatment history:  Diagnosed 2007 no treatment  Started on tivicay/descovy in hospital 11/2018-discharged on biktarvy    Review of Systems:  A 12 system review of systems was negative except as noted in HPI.    Medical History:  Past Medical History:   Diagnosis Date   ??? HIV (human immunodeficiency virus infection) (CMS-HCC)        Surgical History:  Past Surgical History:   Procedure Laterality Date   ??? PR BX/REMV,LYMPH NODE,DEEP AXILL Left 12/31/2018    Procedure: BX/EXC LYMPH NODE; OPEN, DEEP AXILRY NODE;  Surgeon: [name], MD;  Location: MAIN OR [facility];  Service: Surgical Oncology       Allergies and Medications  Current Therapy/ Therapy history:  BIktarvy  Bactrim  Other medications reviewed.     Reviewed and updated today. See bottom of this visit's encounter summary for details.    Allergies:  Patient has no known allergies.      Social History  Lives with wife and 2 young daughters  No tobacco, illicit drug use    Family History  Reviewed and updated today. See bottom of this visit's encounter summary for details.    Immunizations:  Immunization History   Administered Date(s) Administered   ??? Influenza Vaccine Quad (IIV4 PF) 38mo+ injectable 01/22/2019   ??? Pneumococcal Conjugate 13-Valent 04/12/2019   ??? TdaP 04/12/2019       Review of Systems  As per HPI. Remainder of 10 systems reviewed, negative.        Objective:      Objective   BP 123/82 (BP Site: L Arm, BP Position: Sitting, BP Cuff Size: Medium)  - Pulse 107  - Temp 36.8 ??C (98.3 ??F) (Temporal)     GEN: in nad, in wheelchair   HEEN: sclerae anicteric EOMI  CV: rrr no m/r/g  Lungs: cta b/l  GI: soft nt nd  Ext: left hand with improved duskiness of fingers, 3rd digit with autoamputation wound at DIP healing, no active warmth/erythema/draiange  Skin: healing papules UE b/l and excoriations        Microbiology:    See HPI    Imaging:   MRI 9/21 brain  IMPRESSION:  Decreased size of enhancing lesion in the right corona radiata, with slightly increased surrounding edema and/or gliosis    MRI spine 8/28  IMPRESSION:  Compared to 12/11/2018, there has been little interval change, with persistent increased T2 signal in the spinal  cord from the level of T10 to the conus medullaris, predominantly involving the central portions of the cord, as well as focal T2 hyperintensity within the cord at T10 and cord enhancement at T12.   ??  Multilevel degenerative changes are similar to prior as described above. Disc bulge and facet arthrosis at L5-S1 results in severe left and moderate right neural foraminal narrowing

## 2019-04-14 LAB — HIV RNA, QUANTITATIVE, PCR: HIV RNA QNT RSLT: DETECTED — AB

## 2019-04-14 LAB — HIV RNA: HIV 1 RNA:NCnc:Pt:Ser/Plas:Qn:Probe.amp.tar: <40 — ABNORMAL HIGH

## 2019-04-21 ENCOUNTER — Other Ambulatory Visit: Payer: Self-pay

## 2019-04-21 DIAGNOSIS — R2242 Localized swelling, mass and lump, left lower limb: Secondary | ICD-10-CM | POA: Diagnosis not present

## 2019-04-21 DIAGNOSIS — Z21 Asymptomatic human immunodeficiency virus [HIV] infection status: Secondary | ICD-10-CM | POA: Insufficient documentation

## 2019-04-21 DIAGNOSIS — Y732 Prosthetic and other implants, materials and accessory gastroenterology and urology devices associated with adverse incidents: Secondary | ICD-10-CM | POA: Diagnosis not present

## 2019-04-21 DIAGNOSIS — E1169 Type 2 diabetes mellitus with other specified complication: Secondary | ICD-10-CM | POA: Diagnosis not present

## 2019-04-21 DIAGNOSIS — R32 Unspecified urinary incontinence: Secondary | ICD-10-CM | POA: Diagnosis present

## 2019-04-21 DIAGNOSIS — T83511A Infection and inflammatory reaction due to indwelling urethral catheter, initial encounter: Secondary | ICD-10-CM | POA: Diagnosis not present

## 2019-04-21 DIAGNOSIS — E1165 Type 2 diabetes mellitus with hyperglycemia: Secondary | ICD-10-CM | POA: Insufficient documentation

## 2019-04-21 LAB — COMPREHENSIVE METABOLIC PANEL
ALT: 18 U/L (ref 0–44)
AST: 16 U/L (ref 15–41)
Albumin: 4.2 g/dL (ref 3.5–5.0)
Alkaline Phosphatase: 123 U/L (ref 38–126)
Anion gap: 11 (ref 5–15)
BUN: 11 mg/dL (ref 6–20)
CO2: 26 mmol/L (ref 22–32)
Calcium: 9.3 mg/dL (ref 8.9–10.3)
Chloride: 95 mmol/L — ABNORMAL LOW (ref 98–111)
Creatinine, Ser: 0.41 mg/dL — ABNORMAL LOW (ref 0.61–1.24)
GFR calc Af Amer: >60 mL/min (ref >60)
GFR calc non Af Amer: >60 mL/min (ref >60)
Glucose, Bld: 409 mg/dL — ABNORMAL HIGH (ref 70–99)
Potassium: 3.8 mmol/L (ref 3.5–5.1)
Sodium: 132 mmol/L — ABNORMAL LOW (ref 135–145)
Total Bilirubin: 0.4 mg/dL (ref 0.3–1.2)
Total Protein: 8.4 g/dL — ABNORMAL HIGH (ref 6.5–8.1)

## 2019-04-21 LAB — CBC
HCT: 41.5 % (ref 39.0–52.0)
Hemoglobin: 14.3 g/dL (ref 13.0–17.0)
MCH: 28.7 pg (ref 26.0–34.0)
MCHC: 34.5 g/dL (ref 30.0–36.0)
MCV: 83.3 fL (ref 80.0–100.0)
Platelets: 252 10*3/uL (ref 150–400)
RBC: 4.98 MIL/uL (ref 4.22–5.81)
RDW: 12.1 % (ref 11.5–15.5)
WBC: 7.5 10*3/uL (ref 4.0–10.5)
nRBC: 0 % (ref 0.0–0.2)

## 2019-04-21 LAB — LACTIC ACID, PLASMA: Lactic Acid, Venous: 1 mmol/L (ref 0.5–1.9)

## 2019-04-21 NOTE — ED Triage Notes (Signed)
Pt states in September he came here for multiple complaints and was transferred to [facility] when he was diagnosed with a lesion to the spine. Pt is now paralyzed from mid thigh down. Pt here for urinary urgency and incontinence, pt does self cath routinely. PT also concerned about left foot swelling states noticed slight swelling today.

## 2019-04-22 ENCOUNTER — Emergency Department
Admission: EM | Admit: 2019-04-22 | Discharge: 2019-04-22 | Disposition: A | Discharge status: Home / Self Care | Payer: BLUE CROSS/BLUE SHIELD | Attending: Emergency Medicine | Admitting: Emergency Medicine

## 2019-04-22 ENCOUNTER — Emergency Department: Payer: BLUE CROSS/BLUE SHIELD

## 2019-04-22 DIAGNOSIS — N390 Urinary tract infection, site not specified: Secondary | ICD-10-CM

## 2019-04-22 DIAGNOSIS — E1169 Type 2 diabetes mellitus with other specified complication: Secondary | ICD-10-CM

## 2019-04-22 LAB — URINALYSIS, COMPLETE (UACMP) WITH MICROSCOPIC
Bilirubin Urine: NEGATIVE
Glucose, UA: >=500 mg/dL — AB
Hgb urine dipstick: NEGATIVE
Ketones, ur: NEGATIVE mg/dL
Nitrite: NEGATIVE
Protein, ur: NEGATIVE mg/dL
Specific Gravity, Urine: 1.032 — ABNORMAL HIGH (ref 1.005–1.030)
Squamous Epithelial / HPF: NONE SEEN (ref 0–5)
pH: 6 (ref 5.0–8.0)

## 2019-04-22 LAB — GLUCOSE, CAPILLARY: Glucose-Capillary: 304 mg/dL — ABNORMAL HIGH (ref 70–99)

## 2019-04-22 MED ORDER — INSULIN ASPART 100 UNIT/ML ~~LOC~~ SOLN
6.0000 [IU] | Freq: Once | SUBCUTANEOUS | Status: AC
  Administered 2019-04-22: 6 [IU] via INTRAVENOUS
  Filled 2019-04-22: qty 1

## 2019-04-22 MED ORDER — METFORMIN HCL 500 MG PO TABS: 500.0000 mg | ORAL_TABLET | Freq: Two times a day (BID) | ORAL | 11 refills | Status: AC

## 2019-04-22 MED ORDER — SODIUM CHLORIDE 0.9 % IV BOLUS
1000.0000 mL | Freq: Once | INTRAVENOUS | Status: AC
  Administered 2019-04-22: 1000 mL via INTRAVENOUS

## 2019-04-22 MED ORDER — CEPHALEXIN 500 MG PO CAPS: 500.0000 mg | ORAL_CAPSULE | Freq: Two times a day (BID) | ORAL | 0 refills | Status: AC

## 2019-04-22 MED ORDER — LORAZEPAM 2 MG/ML IJ SOLN
1.0000 mg | Freq: Once | INTRAMUSCULAR | Status: AC
  Administered 2019-04-22: 1 mg via INTRAVENOUS
  Filled 2019-04-22: qty 1

## 2019-04-22 NOTE — ED Notes (Signed)
Interpreter requested to go over  DC instructions with pt  

## 2019-04-22 NOTE — ED Provider Notes (Signed)
[facility] Emergency Department Provider Note  ____________________________________________   First MD Initiated Contact with Patient 04/22/19 0114     (approximate)  I have reviewed the triage vital signs and the nursing notes.   HISTORY  Chief Complaint Leg Pain    HPI Ryan Bush is a 43 y.o. male with below list of previous medical conditions including paralysis presents with urinary urgency and incontinence.  Patient states that he self caths at home routinely however recently he has noted that he has been incontinent of urine.  Patient denies any discomfort.  Patient also admits to left lower extremity swelling.  Patient denies any fever       Past Medical History:  Diagnosis Date  . HIV (human immunodeficiency virus infection) (HCC)     There are no problems to display for this patient.   No past surgical history on file.  Prior to Admission medications   Not on File    Allergies Patient has no known allergies.  No family history on file.  Social History Social History   Tobacco Use  . Smoking status: Never Smoker  . Smokeless tobacco: Never Used  Substance Use Topics  . Alcohol use: Never  . Drug use: Not on file    Review of Systems Constitutional: No fever/chills Eyes: No visual changes. ENT: No sore throat. Cardiovascular: Denies chest pain. Respiratory: Denies shortness of breath. Gastrointestinal: No abdominal pain.  No nausea, no vomiting.  No diarrhea.  No constipation. Genitourinary:Positive for urinary incontinence Musculoskeletal: Negative for neck pain.  Negative for back pain. Integumentary: Negative for rash. Neurological: Negative for headaches, focal weakness or numbness.   ____________________________________________   PHYSICAL EXAM:  VITAL SIGNS: ED Triage Vitals [04/21/19 2219]  Enc Vitals Group     BP (!) 157/93     Pulse Rate (!) 119     Resp 20     Temp 99.7 F (37.6 C)     Temp Source Oral     SpO2 100 %     Weight 65.8 kg (145 lb)     Height      Head Circumference      Peak Flow      Pain Score 0     Pain Loc      Pain Edu?      Excl. in GC?     Constitutional: Alert and oriented.  Eyes: Conjunctivae are normal.  Mouth/Throat: Patient is wearing a mask. Neck: No stridor.  No meningeal signs.   Cardiovascular: Normal rate, regular rhythm. Good peripheral circulation. Grossly normal heart sounds. Respiratory: Normal respiratory effort.  No retractions. Gastrointestinal: Soft and nontender. No distention.  Genitourinary: No gross abnormality Neurologic:  Normal speech and language. Skin:  Skin is warm, dry and intact. Psychiatric: Mood and affect are normal. Speech and behavior are normal.  ____________________________________________   LABS (all labs ordered are listed, but only abnormal results are displayed)  Labs Reviewed  COMPREHENSIVE METABOLIC PANEL - Abnormal; Notable for the following components:      Result Value   Sodium 132 (*)    Chloride 95 (*)    Glucose, Bld 409 (*)    Creatinine, Ser 0.41 (*)    Total Protein 8.4 (*)    All other components within normal limits  URINALYSIS, COMPLETE (UACMP) WITH MICROSCOPIC - Abnormal; Notable for the following components:   Color, Urine YELLOW (*)    APPearance HAZY (*)    Specific Gravity, Urine 1.032 (*)  Glucose, UA >=500 (*)    Leukocytes,Ua SMALL (*)    Bacteria, UA FEW (*)    All other components within normal limits  GLUCOSE, CAPILLARY - Abnormal; Notable for the following components:   Glucose-Capillary 304 (*)    All other components within normal limits  CBC  LACTIC ACID, PLASMA  LACTIC ACID, PLASMA    RADIOLOGY I, Harvey N Nestor Wieneke, personally viewed and evaluated these images (plain radiographs) as part of my medical decision making, as well as reviewing the written report by the radiologist.  ED MD interpretation: Altered signal bilateral lower psoas and  intrinsic back muscles correlating with history of paralysis on MRI per radiologist no acute findings  Official radiology report(s): MR LUMBAR SPINE WO CONTRAST  Result Date: 04/22/2019 CLINICAL DATA:  Urinary incontinence EXAM: MRI LUMBAR SPINE WITHOUT CONTRAST TECHNIQUE: Multiplanar, multisequence MR imaging of the lumbar spine was performed. No intravenous contrast was administered. COMPARISON:  None. FINDINGS: Segmentation:  Standard lumbar numbering Alignment: L5-S1 grade 1 anterolisthesis with underlying pars defects Vertebrae: No fracture, evidence of discitis, or bone lesion. Chronic bilateral L5 pars defects Conus medullaris and cauda equina: Conus extends to the L1 level. Conus and cauda equina appear normal. Paraspinal and other soft tissues: Atrophy of bilateral lower psoas. Volume loss and increased STIR signal within lower intrinsic back muscles. The bilateral gluteal musculature may also be thinned, but limited coverage. There is history of paralysis. Disc levels: L4-5 mild disc desiccation and bulging.  No neural compression. L5 chronic bilateral pars defects with anterolisthesis and accelerated disc degeneration. Left eccentric bulging with left foraminal protrusion compressing the left L5 nerve root. The foramen is patent IMPRESSION: 1. Atrophy and altered signal within bilateral lower psoas and intrinsic back muscles correlating with history of paralysis. 2. 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 Electronically Signed   By: Monte Fantasia M.D.   On: 04/22/2019 06:58   US Venous Img Lower Unilateral Left  Result Date: 04/22/2019 CLINICAL DATA:  Pain and swelling.  Left foot swelling today. EXAM: LEFT LOWER EXTREMITY VENOUS DOPPLER ULTRASOUND TECHNIQUE: Gray-scale sonography with graded compression, as well as color Doppler and duplex ultrasound were performed to evaluate the lower extremity deep  venous systems from the level of the common femoral vein and including the common femoral, femoral, profunda femoral, popliteal and calf veins including the posterior tibial, peroneal and gastrocnemius veins when visible. The superficial great saphenous vein was also interrogated. Spectral Doppler was utilized to evaluate flow at rest and with distal augmentation maneuvers in the common femoral, femoral and popliteal veins. COMPARISON:  None. FINDINGS: Contralateral Common Femoral Vein: Respiratory phasicity is normal and symmetric with the symptomatic side. No evidence of thrombus. Normal compressibility. Common Femoral Vein: No evidence of thrombus. Normal compressibility, respiratory phasicity and response to augmentation. Saphenofemoral Junction: No evidence of thrombus. Normal compressibility and flow on color Doppler imaging. Profunda Femoral Vein: No evidence of thrombus. Normal compressibility and flow on color Doppler imaging. Femoral Vein: No evidence of thrombus. Normal compressibility, respiratory phasicity and response to augmentation. Popliteal Vein: No evidence of thrombus. Normal compressibility, respiratory phasicity and response to augmentation. Calf Veins: No evidence of thrombus. Normal compressibility and flow on color Doppler imaging. Superficial Great Saphenous Vein: No evidence of thrombus. Normal compressibility. Venous Reflux:  None. Other Findings:  None. IMPRESSION: No evidence of left lower extremity deep venous thrombosis. Electronically Signed   By: Aurther Loft.D.  On: 04/22/2019 03:55     Procedures   ____________________________________________   INITIAL IMPRESSION / MDM / ASSESSMENT AND PLAN / ED COURSE  As part of my medical decision making, I reviewed the following data within the electronic MEDICAL RECORD NUMBER   43 year old male presenting with above-stated history and physical exam with concern for possible urinary tract infection versus spinal cord  compression cauda equina syndrome with urinary incontinence.  Urinalysis consistent with possible UTI.  MRI revealed no acute findings.  Patient also noted to have hyperglycemia with no history of diabetes.  Patient will be prescribed metformin for home.  Antibiotics for the urinary tract infection with urine culture pending.  ____________________________________________  FINAL CLINICAL IMPRESSION(S) / ED DIAGNOSES  Final diagnoses:  Type 2 diabetes mellitus with other specified complication, without long-term current use of insulin (HCC)  Urinary tract infection associated with catheterization of urinary tract, unspecified indwelling urinary catheter type, initial encounter [facility](HCC)     MEDICATIONS GIVEN DURING THIS VISIT:  Medications  sodium chloride 0.9 % bolus 1,000 mL (0 mLs Intravenous Stopped 04/22/19 0611)  insulin aspart (novoLOG) injection 6 Units (6 Units Intravenous Given 04/22/19 0436)  LORazepam (ATIVAN) injection 1 mg (1 mg Intravenous Given 04/22/19 0604)     ED Discharge Orders    None      *Please note:  Ellison Carwinlvaro Torralba Aguilar was evaluated in Emergency Department on 04/22/2019 for the symptoms described in the history of present illness. He was evaluated in the context of the global COVID-19 pandemic, which necessitated consideration that the patient might be at risk for infection with the SARS-CoV-2 virus that causes COVID-19. Institutional protocols and algorithms that pertain to the evaluation of patients at risk for COVID-19 are in a state of rapid change based on information released by regulatory bodies including the CDC and federal and state organizations. These policies and algorithms were followed during the patient's care in the ED.  Some ED evaluations and interventions may be delayed as a result of limited staffing during the pandemic.*  Note:  This document was prepared using Dragon voice recognition software and may include unintentional dictation errors.    Darci CurrentBrown, [place] N, MD 04/22/19 [phone]

## 2019-04-27 MED ORDER — DOCUSATE SODIUM 100 MG CAPSULE
ORAL_CAPSULE | Freq: Three times a day (TID) | ORAL | 0 refills | 30 days | Status: CP
Start: 2019-04-27 — End: 2019-05-27
  Filled 2019-04-29: qty 90, 30d supply, fill #0

## 2019-04-27 MED ORDER — SENNOSIDES 8.6 MG TABLET
ORAL_TABLET | Freq: Every day | ORAL | 0 refills | 30.00000 days | Status: CP
Start: 2019-04-27 — End: 2019-05-27
  Filled 2019-04-29: qty 60, 30d supply, fill #0

## 2019-04-28 MED ORDER — NITRO-BID 2 % TRANSDERMAL OINTMENT
Freq: Three times a day (TID) | TRANSDERMAL | 0 refills | 20.00000 days | Status: CP | PRN
Start: 2019-04-28 — End: 2020-04-27
  Filled 2019-04-30: qty 30, 20d supply, fill #0

## 2019-04-29 ENCOUNTER — Ambulatory Visit: Admit: 2019-04-29 | Discharge: 2019-04-30

## 2019-04-29 ENCOUNTER — Ambulatory Visit: Admit: 2019-04-29 | Discharge: 2019-04-30 | Attending: Urology | Primary: Urology

## 2019-04-29 DIAGNOSIS — G8222 Paraplegia, incomplete: Principal | ICD-10-CM

## 2019-04-29 DIAGNOSIS — R339 Retention of urine, unspecified: Principal | ICD-10-CM

## 2019-04-29 DIAGNOSIS — I96 Gangrene, not elsewhere classified: Principal | ICD-10-CM

## 2019-04-29 MED FILL — GRISEOFULVIN ULTRAMICROSIZE 125 MG TABLET: 30 days supply | Qty: 90 | Fill #1 | Status: AC

## 2019-04-29 MED FILL — GERI-KOT 8.6 MG TABLET: 30 days supply | Qty: 60 | Fill #0 | Status: AC

## 2019-04-29 MED FILL — SULFAMETHOXAZOLE 800 MG-TRIMETHOPRIM 160 MG TABLET: 30 days supply | Qty: 60 | Fill #1 | Status: AC

## 2019-04-29 MED FILL — MELATONIN 3 MG TABLET: ORAL | 30 days supply | Qty: 30 | Fill #1

## 2019-04-29 MED FILL — ASPIRIN 81 MG CHEWABLE TABLET: ORAL | 36 days supply | Qty: 36 | Fill #1

## 2019-04-29 MED FILL — GRISEOFULVIN ULTRAMICROSIZE 125 MG TABLET: ORAL | 30 days supply | Qty: 90 | Fill #1

## 2019-04-29 MED FILL — MELATONIN 3 MG TABLET: 30 days supply | Qty: 30 | Fill #1 | Status: AC

## 2019-04-29 MED FILL — SULFAMETHOXAZOLE 800 MG-TRIMETHOPRIM 160 MG TABLET: ORAL | 30 days supply | Qty: 60 | Fill #1

## 2019-04-29 MED FILL — ASPIRIN 81 MG CHEWABLE TABLET: 36 days supply | Qty: 36 | Fill #1 | Status: AC

## 2019-04-29 MED FILL — DOK 100 MG CAPSULE: 30 days supply | Qty: 90 | Fill #0 | Status: AC

## 2019-04-29 NOTE — Unmapped (Signed)
Thank you for choosing [facility]!  We appreciate the opportunity to participate in your care.      If any questions or concerns arise after your visit, please do not hesitant to contact me by [facility] or by calling my clinical assistant, Denise at [phone].      Voicemail messages: Messages are checked between 8:00 am- 4:00 pm Monday- Friday.    MyChart messages: These messages are checked by the nurses during normal business hours 8:30 am-4:30 pm Monday-Friday every 24-48 hours and are for non-urgent, non-emergent concerns. You may be asked to return for a follow up visit if it is deemed your questions are best handled in the clinic setting.    Please let me know if I can be of assistance with this or other orthopaedic issues in the future.      Patient Education        Dedos: Ejercicios  Finger: Exercises  Instrucciones de cuidado  Estos son algunos ejemplos de ejercicios para sus dedos. Comience cada ejercicio lentamente. Reduzca la intensidad del ejercicio si empieza a Financial risk analyst.  Su m??dico o su fisioterapeuta o terapeuta ocupacional le dir?? cu??ndo puede comenzar con estos ejercicios y cu??les funcionar??n mejor para usted.  C??mo se hacen los ejercicios  Deslizamiento de tendones   1. En este ejercicio, los pasos se hacen uno tras otro para hacer un movimiento continuo.  2. Con una mano, apunte con los dedos y el pulgar [place]. La mu??eca debe estar relajada, siguiendo la l??nea de los dedos y del Engineer, production.  3. Doble los dedos para que se flexionen las dos primeras articulaciones, y los dedos se plieguen hacia abajo. La punta de los dedos debe tocar o estar cerca de la base de los dedos. Los dedos se ver??n como un gancho.  4. Cierre el pu??o doblando los nudillos. El pulgar puede descansar suavemente en el dedo ??ndice. 5. Relaje ligeramente los dedos para que las puntas de los dedos puedan tocar la base de la palma de la [place]. El pulgar puede descansar en el dedo ??ndice. Mantenga esa posici??n por unos 6 segundos.  6. Vuelva a la posici??n inicial, con los dedos y Multimedia programmer apuntando [place].  7. Repita la serie de movimientos entre 8 y 12 veces.  8. Cambie de mano y repita los pasos del 1 al 6.    Flexi??n/extensi??n del pulgar   1. Coloque el Product manager y la mano sobre una mesa con el pulgar apuntando [place].  2. Doble el pulgar hacia abajo y cruzando la palma, de manera que el pulgar toque la base del dedo me??ique. Mantenga esa posici??n por unos 6 segundos. Luego enderece el pulgar.  3. Repita entre 8 y 12 veces.  4. Cambie de mano y repita los pasos del 1 al 3.    Abducci??n/aducci??n del pulgar   1. [employer] Simon, apunte con los dedos y el pulgar [place]. La mu??eca debe estar relajada, siguiendo la l??nea de los dedos y del Engineer, production.  2. Aleje el pulgar de la palma de la mano tanto como pueda. Mantenga esa posici??n durante unos 6 segundos. Regrese lentamente el pulgar a la posici??n inicial, con el pulgar descansando sobre el dedo ??ndice.  3. Repita de 8 a 12 veces.  4. Cambie de mano y repita los pasos del 1 al 3.    Oposici??n de dedos   1. Con [employer] Simon, apunte con los dedos y el pulgar [place]. La mu??eca debe  estar relajada, siguiendo la l??nea de los dedos y del Engineer, production.  2. Toque el pulgar con cada dedo, un dedo a la vez. Esto se ver?? como una se??al de [employer] (okay), pero trate de [employer] otros dedos rectos y apuntando hacia arriba tanto como pueda.  3. Repita entre 8 y 12 veces.  4. Cambie de mano y repita los pasos del 1 al 3. La atenci??n de seguimiento es una parte clave de su tratamiento y seguridad. Aseg??rese de hacer y acudir a todas las citas, y llame a su m??dico si est?? teniendo problemas. Tambi??n es una buena idea [employer] de sus ex??menes y [employer] de los medicamentos que toma.  ??D??nde puede encontrar m??s informaci??n en ingl??s?  Vaya a [facility] en [email or web address]  Seleccione Patient Education (Educaci??n del Paciente) en el men?? Resources (Recursos). Marcelino Freestone Z610 en la b??squeda para aprender m??s acerca de Dedos: Ejercicios.  Revisado: 2 marzo, 2020??????????????????????????????Versi??n del contenido: 12.7  ?? 2006-2020 Healthwise, Incorporated.   Las instrucciones de cuidado fueron adaptadas bajo licencia por [employer]. Si usted tiene preguntas sobre una afecci??n m??dica o sobre estas instrucciones, siempre pregunte a su profesional de salud. Healthwise, Incorporated niega toda garant??a o responsabilidad por su uso de esta informaci??n.

## 2019-04-29 NOTE — Unmapped (Signed)
[facility]  [employer]. 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  He will continue using Nitrostat cream on the left long finger until wound has completely healed which he is in agreement.  He will continue daily dressing changes of the long finger and he was referred to hand therapy to work on range of motion and strengthening exercises.  He will follow-up in the clinic in approximately 8 weeks for repeat evaluation.  Warning signs and symptoms of infection were discussed in detail and if these occur he should seek medical attention immediately  We will proceed with the following treatment plan:  1. Medications: OTC Tylenol  2. DME/Cast: none  3. PT/OT: none  4. Injections: none  5.   Follow-up: No follow-ups on file.   6.   X-rays at next visit: eval first    SUBJECTIVE:  Chief Complaint:  Left hand follow up  History of Present Illness:   Don [name] is a 44 y.o. right hand dominant  male  who presents for repeat evaluation of dry gangrene of the left index and long fingers that was diagnosed while he was inpatient on 12/10/2018.  After extensive work-up in the hospital with further consultations with rheumatology, vascular surgery, hematology, and orthopedics it was ultimately decided that gangrene was result of possible atherosclerosis.  Patient has been using Nitro cream daily with significant provement of gangrene about the left index and long finger.  He reports index finger ulceration has completely healed and long finger is continuing to heal appropriately.  He denies any fevers, chills, purulent drainage, or warmth.  He notes stiffness about the left hand.  A Spanish interpreter was used for the entirety of visit  Pain Score: 1/10  Quality: Aching  Medications Tried: OTC Tylenol Modifying factors: Better with rest  Associated Signs: Denies any numbness and tingling  Employment: disabled     Medical History Past Medical History:   Diagnosis Date   ??? HIV (human immunodeficiency virus infection) (CMS-HCC)       Surgical History Past Surgical History:   Procedure Laterality Date   ??? PR BX/REMV,LYMPH NODE,DEEP AXILL Left 12/31/2018    Procedure: BX/EXC LYMPH NODE; OPEN, DEEP AXILRY NODE;  Surgeon: [name], MD;  Location: MAIN OR [facility];  Service: Surgical Oncology      Medications   Current Outpatient Medications:   ???  aspirin 81 MG chewable tablet, Chew 1 tablet (81 mg total) daily., Disp: 30 tablet, Rfl: 11  ???  atorvastatin (LIPITOR) 20 MG tablet, Take 1 tablet (20 mg total) by mouth daily., Disp: 30 tablet, Rfl: 11  ???  bictegrav-emtricit-tenofov ala (BIKTARVY) 50-200-25 mg tablet, Take 1 tablet by mouth daily., Disp: 30 tablet, Rfl: 11  ???  cephalexin (KEFLEX) 500 MG capsule, Take 500 mg by mouth., Disp: , Rfl:   ???  docusate sodium (DOK) 100 MG capsule, Take 1 capsule (100 mg total) by mouth Three (3) times a day., Disp: 90 capsule, Rfl: 0  ???  econazole nitrate (SPECTAZOLE) 1 % cream, Apply topically Two (2) times a day., Disp: 15 g, Rfl: 0  ???  econazole nitrate (SPECTAZOLE) 1 % cream, Apply topically daily., Disp: 85 g, Rfl: 0  ???  gabapentin (NEURONTIN) 100 MG capsule, Take 1 capsule (100 mg  total) by mouth Three (3) times a day. (Patient not taking: Reported on 04/12/2019), Disp: 90 capsule, Rfl: 2  ???  griseofulvin (GRIS-PEG) 125 MG tablet, Take 3 tablets (375 mg total) by mouth daily., Disp: 90 tablet, Rfl: 4  ???  melatonin 3 mg Tab, Take 1 tablet (3 mg total) by mouth every evening., Disp: 30 tablet, Rfl: 11  ???  metFORMIN (GLUCOPHAGE) 500 MG tablet, Take 500 mg by mouth., Disp: , Rfl:   ???  nitroglycerin (NITRO-BID) 2 % ointment, Place 0.5 inches on the skin every eight (8) hours as needed., Disp: 30 g, Rfl: 0 ???  senna (GERI-KOT) 8.6 mg tablet, Take 2 tablets by mouth daily with lunch., Disp: 60 tablet, Rfl: 0  ???  sulfamethoxazole-trimethoprim (BACTRIM DS) 800-160 mg per tablet, Take 1 tablet (160 mg of trimethoprim total) by mouth Two (2) times a day., Disp: 60 tablet, Rfl: 5   Allergies Patient has no known allergies.     Social History Social History     Tobacco Use   ??? Smoking status: Never Smoker   ??? Smokeless tobacco: Never Used   Substance Use Topics   ??? Alcohol use: Not Currently   ??? Drug use: Not Currently        Family History family history includes Alcohol abuse in his brother; Diabetes in his sister; Kidney disease in his sister; Mental illness in an other family member.     Review of Systems 12 point review of systems were obtained in clinic and all pertinent postives/negatives were documented in the HPI.     OBJECTIVE:  Physical Exam:  General Appearance ?? well-nourished and no acute distress   Mood and Affect ?? alert, cooperative and pleasant   Gait and Station ?? Patient presents in wheelchair   Cardiovascular ?? well-perfused distally and no swelling   Sensation ?? Sensation intact to light touch distally   MUSCULOSKELETAL    Left upper extremity ?? Inspection: healing ulceration of the left long middle phalanx with no surrounding erythema or warmth. No evidence of ulceration about the index finger.  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 Tenderness: Tender about the index finger and long finger  ?? ROM: Limited range of motion of the index and long finger, index finger reaching approximately 3 cm the distal palmar crease  ?? Stability Exam: No evidence of instability of the wrist  ?? Strength: 4+/5 grip strength      Test Results  Imaging  Xrays of patients left hand were obtained and independently interpreted myself which reveal evidense of amputation to the middle phalanx of the long finger.  No evidence of osteomyelitis noted about the index and long fingers

## 2019-04-29 NOTE — Unmapped (Signed)
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 We reviewed the standard management and evaluation for a NGB from a spinal cord injury.  We reviewed the role of video urodynamics in understanding his bladder pathophysiology.  He understands the potential implications (renal & bladder) of his neurogenic bladder & the need for lifelong surveillance.    Plan:    Schedule VUDS  Annual RUS    ______________________________________________________________________    Referring Physician:   Ronelle Nigh, MD  [address]  [address]  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     HPI:   44 y.o. 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  SCI Classification: ~T10  Mobility: manual wheelchair    Urologic Issues Prior to Injury: no    Current Bladder Management: CIC   Cath Schedule: 6x per day   Pt caths based on: Time   Catheter: 14 [place] [address]ight   Person Performing Caths: Patient   Single Use Caths: yes   Onset: 11/2018   Diagnosis: neurogenic bladder, urinary retention   Need: lifelong    Urologic Surgery: no    Urologic Medications: no     Urinary Incontinence: no   Type of Incontinence: very rare urge    Urinary Tract Infections: no but had UA 12/31 with trace lE     Symptoms of Autonomic Dysreflexia: no    Urodynamics: none yet    Imaging: PET CT 12/25/2018: images reviewed.  My interpretation as follows   - normal appearing kidneys   - parenchyma looks normal   - no stones   - no hydro   - bladder decompressed by foley    Labs: - Creatinine 04/12/2019: 0.50    Erectile Dysfunction: yes    Bowel dysfunction: yes   - Bowel program: 6pm digistim & suppository    Medical History  Past Medical History:   Diagnosis Date   ??? HIV (human immunodeficiency virus infection) (CMS-HCC) Neurogenic bladder  Neurogenic bowel    Surgical History  Past Surgical History:   Procedure Laterality Date   ??? PR BX/REMV,LYMPH NODE,DEEP AXILL Left 12/31/2018    Procedure: BX/EXC LYMPH NODE; OPEN, DEEP AXILRY NODE;  Surgeon: [name], MD;  Location: MAIN OR [facility];  Service: Surgical Oncology       Social History:  Patient  reports that he has never smoked. He has never used smokeless tobacco. He reports previous alcohol use. He reports previous drug use.     Family History:  The patient's family history includes Alcohol abuse in his brother; Diabetes in his sister; Kidney disease in his sister; Mental illness in an other family member.     Medications:  Current Outpatient Medications   Medication Sig Dispense Refill   ??? aspirin 81 MG chewable tablet Chew 1 tablet (81 mg total) daily. 30 tablet 11   ??? atorvastatin (LIPITOR) 20 MG tablet Take 1 tablet (20 mg total) by mouth daily. 30 tablet 11   ??? bictegrav-emtricit-tenofov ala (BIKTARVY) 50-200-25 mg tablet Take 1 tablet by mouth daily. 30 tablet 11   ??? docusate sodium (DOK) 100 MG capsule Take 1 capsule (100 mg total) by mouth Three (3) times a day. 90 capsule 0   ??? econazole nitrate (SPECTAZOLE)  1 % cream Apply topically Two (2) times a day. 15 g 0   ??? econazole nitrate (SPECTAZOLE) 1 % cream Apply topically daily. 85 g 0   ??? gabapentin (NEURONTIN) 100 MG capsule Take 1 capsule (100 mg total) by mouth Three (3) times a day. (Patient not taking: Reported on 04/12/2019) 90 capsule 2   ??? griseofulvin (GRIS-PEG) 125 MG tablet Take 3 tablets (375 mg total) by mouth daily. 90 tablet 4   ??? melatonin 3 mg Tab Take 1 tablet (3 mg total) by mouth every evening. 30 tablet 11   ??? nitroglycerin (NITRO-BID) 2 % ointment Place 0.5 inches on the skin every eight (8) hours as needed. 30 g 0   ??? senna (GERI-KOT) 8.6 mg tablet Take 2 tablets by mouth daily with lunch. 60 tablet 0   ??? sulfamethoxazole-trimethoprim (BACTRIM DS) 800-160 mg per tablet Take 1 tablet (160 mg of trimethoprim total) by mouth Two (2) times a day. 60 tablet 5     No current facility-administered medications for this visit.        Allergies:  Patient has no known allergies.     ROS  All other symptoms are negative except as noted in HPI    Physical Exam:  There were no vitals taken for this visit.  General: well developed, well nourished, no acute distress  Psychiatric: alert, well groomed     Procedures: none    Labs:   Results for orders placed or performed in visit on 04/12/19   ALT   Result Value Ref Range    ALT 21 <50 U/L   AST   Result Value Ref Range    AST 21 19 - 55 U/L   Basic Metabolic Panel   Result Value Ref Range    Sodium 132 (L) 135 - 145 mmol/L    Potassium 4.8 3.5 - 5.0 mmol/L    Chloride 96 (L) 98 - 107 mmol/L    CO2 25.0 22.0 - 30.0 mmol/L    Anion Gap 11 7 - 15 mmol/L    BUN 13 7 - 21 mg/dL    Creatinine 9.56 (L) 0.70 - 1.30 mg/dL    BUN/Creatinine Ratio 26     EGFR CKD-EPI Non-African American, Male >90 >=60 mL/min/1.21m2    EGFR CKD-EPI African American, Male >90 >=60 mL/min/1.40m2    Glucose 333 (H) 70 - 179 mg/dL    Calcium 9.8 8.5 - 21.3 mg/dL   Bilirubin, total   Result Value Ref Range    Total Bilirubin 0.7 0.0 - 1.2 mg/dL   HIV RNA, Quantitative, PCR   Result Value Ref Range    HIV RNA Quant Result Detected (A) Not Detected    HIV RNA <40 (H) <0 copies/mL    HIV RNA Log(10)      HIV RNA Comment     LYMPH MARKER LIMITED,FLOW   Result Value Ref Range    CD3% (T Cells) 83 61 - 86 %    Absolute CD3 Count 1,232 915-3,400 /uL    CD4% (T Helper) 21 (L) 34 - 58 %    Absolute CD4 Count 312 (L) 510-2,320 /uL    CD8% T Suppressor 59 (H) 12 - 38 %    Absolute CD8 Count 876 180-1,520 /uL    CD4:CD8 Ratio 0.4 (L) 0.9 - 4.8   CBC w/ Differential   Result Value Ref Range    WBC 8.4 4.5 - 11.0 10*9/L    RBC 5.06 4.50 - 5.90 10*12/L  HGB 15.1 13.5 - 17.5 g/dL    HCT 09.8 11.9 - 14.7 %    MCV 86.2 80.0 - 100.0 fL    MCH 29.8 26.0 - 34.0 pg    MCHC 34.6 31.0 - 37.0 g/dL    RDW 82.9 56.2 - 13.0 %    MPV 10.4 (H) 7.0 - 10.0 fL    Platelet 235 150 - 440 10*9/L    Neutrophils % 72.4 %    Lymphocytes % 16.8 %    Monocytes % 6.1 %    Eosinophils % 3.6 %    Basophils % 0.5 %    Neutrophil Left Shift 1+ (A) Not Present    Absolute Neutrophils 6.1 2.0 - 7.5 10*9/L    Absolute Lymphocytes 1.4 (L) 1.5 - 5.0 10*9/L    Absolute Monocytes 0.5 0.2 - 0.8 10*9/L    Absolute Eosinophils 0.3 0.0 - 0.4 10*9/L    Absolute Basophils 0.0 0.0 - 0.1 10*9/L    Large Unstained Cells 1 0 - 4 %        Imaging Reviewed: I personally reviewed the patient's PET CT 12/25/2018. & discussed the results with the patient    Medical Records Reviewed: I personally reviewed as well as reviewed with the patient  medical records from [facility] that are summarized above

## 2019-04-30 MED FILL — ECONAZOLE 1 % TOPICAL CREAM: TOPICAL | 15 days supply | Qty: 30 | Fill #0

## 2019-04-30 MED FILL — NITRO-BID 2 % TRANSDERMAL OINTMENT: 20 days supply | Qty: 30 | Fill #0 | Status: AC

## 2019-04-30 MED FILL — ECONAZOLE 1 % TOPICAL CREAM: 15 days supply | Qty: 30 | Fill #0 | Status: AC

## 2019-05-05 NOTE — Unmapped (Signed)
See the Language Assistant Navigator for documentation.  Don [name]

## 2019-05-11 NOTE — Unmapped (Signed)
Social Work Referral Services Note INK    Duration of Intervention: 15 minutes    Pt called SW stating that his catheters weren't going to be covered anymore. Pt has about 20 left. SW sent pt chart to internal ID pharmacist to problem solve.     Lilli Few, CHES  ID Clinic Social Work   Direct: [phone]  Main ID: [phone]

## 2019-05-12 MED ORDER — ECONAZOLE 1 % TOPICAL CREAM
Freq: Two times a day (BID) | TOPICAL | 0 refills | 0 days | Status: CP
Start: 2019-05-12 — End: 2020-05-11
  Filled 2019-05-17: qty 85, 30d supply, fill #0

## 2019-05-12 MED ORDER — DOCUSATE SODIUM 100 MG CAPSULE
ORAL_CAPSULE | Freq: Three times a day (TID) | ORAL | 0 refills | 30 days | Status: CP
Start: 2019-05-12 — End: 2019-06-11
  Filled 2019-05-26: qty 90, 30d supply, fill #0

## 2019-05-12 MED ORDER — BISACODYL 10 MG RECTAL SUPPOSITORY
Freq: Every evening | RECTAL | 0 refills | 30.00000 days | Status: CP
Start: 2019-05-12 — End: 2019-06-11
  Filled 2019-05-17: qty 36, 36d supply, fill #0

## 2019-05-12 NOTE — Unmapped (Signed)
[facility] Refill Coordination Note    Specialty Medication(s) to be Shipped:   Infectious Disease: Biktarvy    Other medication(s) to be shipped: atorvastatin  Bisacodyl suppository- rquested refill for full month  Nitroglycerin oitment- requested refill  Econazole- requested refill  Docusate- requested refill  gabapentin     Don [name], DOB: [birth year]  Phone: [phone] (home)       All above HIPAA information was verified with patient.     Was a Nurse, learning disability used for this call? Yes, spanish. Patient language is appropriate in [facility]    Completed refill call assessment today to schedule patient's medication shipment from the [facility] [phone]).       Specialty medication(s) and dose(s) confirmed: Regimen is correct and unchanged.   Changes to medications: Ren reports no changes at this time.  Changes to insurance: No  Questions for the pharmacist: No    Confirmed patient received Welcome Packet with first shipment. The patient will receive a drug information handout for each medication shipped and additional FDA Medication Guides as required.       DISEASE/MEDICATION-SPECIFIC INFORMATION        N/A    SPECIALTY MEDICATION ADHERENCE     Medication Adherence    Patient reported X missed doses in the last month: 0  Specialty Medication: biktarvy                biktarvy  : 5 days of medicine on hand         SHIPPING     Shipping address confirmed in Epic.     Delivery Scheduled: Yes, Expected medication delivery date: 1/25.     Medication will be delivered via Same Day Courier to the prescription address in Epic WAM.    Westley Gambles   [facility] Specialty Technician

## 2019-05-12 NOTE — Unmapped (Signed)
See the Tourist information centre manager for documentation.  Arlean Hopping

## 2019-05-13 MED ORDER — NITRO-BID 2 % TRANSDERMAL OINTMENT
Freq: Three times a day (TID) | TRANSDERMAL | 0 refills | 20.00000 days | Status: CP | PRN
Start: 2019-05-13 — End: 2020-05-12
  Filled 2019-05-17: qty 30, 20d supply, fill #0

## 2019-05-17 MED FILL — ATORVASTATIN 20 MG TABLET: ORAL | 30 days supply | Qty: 30 | Fill #1

## 2019-05-17 MED FILL — ECONAZOLE 1 % TOPICAL CREAM: 30 days supply | Qty: 85 | Fill #0 | Status: AC

## 2019-05-17 MED FILL — GABAPENTIN 100 MG CAPSULE: 30 days supply | Qty: 90 | Fill #0 | Status: AC

## 2019-05-17 MED FILL — BIKTARVY 50 MG-200 MG-25 MG TABLET: 30 days supply | Qty: 30 | Fill #2 | Status: AC

## 2019-05-17 MED FILL — GENTLE LAXATIVE (BISACODYL) 10 MG RECTAL SUPPOSITORY: 36 days supply | Qty: 36 | Fill #0 | Status: AC

## 2019-05-17 MED FILL — NITRO-BID 2 % TRANSDERMAL OINTMENT: 20 days supply | Qty: 30 | Fill #0 | Status: AC

## 2019-05-17 MED FILL — BIKTARVY 50 MG-200 MG-25 MG TABLET: ORAL | 30 days supply | Qty: 30 | Fill #2

## 2019-05-17 MED FILL — ATORVASTATIN 20 MG TABLET: 30 days supply | Qty: 30 | Fill #1 | Status: AC

## 2019-05-21 NOTE — Unmapped (Signed)
Duration of Intervention: 5 minutes    REASON FOR DISCHARGE:  .Marland KitchenMarland KitchenNo need for SW services at this time    EFFECTIVE DATE:  05/21/2019    OUTSIDE REFERRALS MADE: No     Melvenia Needles, MSW Intern  [facility]  [phone]

## 2019-05-22 NOTE — Unmapped (Signed)
Progress Note entered by MSW Intern, Melvenia Needles.  Note reviewed and feedback provided by Bradly Bienenstock, LCSWA.     Lilli Few, CHES  ID Clinic Social Work   [phone]

## 2019-05-24 NOTE — Unmapped (Signed)
Refill request - Spectazole and Senna. Last visit: 04/11/20. Last labs: 04/11/20.

## 2019-05-25 MED ORDER — ECONAZOLE 1 % TOPICAL CREAM
Freq: Two times a day (BID) | TOPICAL | 0 refills | 0 days | Status: CP
Start: 2019-05-25 — End: 2020-05-24
  Filled 2019-05-27: qty 85, 15d supply, fill #0

## 2019-05-25 MED ORDER — SENNOSIDES 8.6 MG TABLET
ORAL_TABLET | Freq: Every day | ORAL | 0 refills | 30.00000 days | Status: CP
Start: 2019-05-25 — End: 2019-06-24
  Filled 2019-05-26: qty 60, 30d supply, fill #0

## 2019-05-26 MED FILL — DOK 100 MG CAPSULE: 30 days supply | Qty: 90 | Fill #0 | Status: AC

## 2019-05-26 MED FILL — GRISEOFULVIN ULTRAMICROSIZE 125 MG TABLET: 30 days supply | Qty: 90 | Fill #2 | Status: AC

## 2019-05-26 MED FILL — GRISEOFULVIN ULTRAMICROSIZE 125 MG TABLET: ORAL | 30 days supply | Qty: 90 | Fill #2

## 2019-05-26 MED FILL — SULFAMETHOXAZOLE 800 MG-TRIMETHOPRIM 160 MG TABLET: ORAL | 30 days supply | Qty: 60 | Fill #2

## 2019-05-26 MED FILL — GERI-KOT 8.6 MG TABLET: 30 days supply | Qty: 60 | Fill #0 | Status: AC

## 2019-05-26 MED FILL — SULFAMETHOXAZOLE 800 MG-TRIMETHOPRIM 160 MG TABLET: 30 days supply | Qty: 60 | Fill #2 | Status: AC

## 2019-05-27 MED FILL — ECONAZOLE 1 % TOPICAL CREAM: 15 days supply | Qty: 85 | Fill #0 | Status: AC

## 2019-06-08 NOTE — Unmapped (Signed)
Social Work Referral Services Note    Duration of Intervention: 15 minutes    Pt called asking for help getting new catheters. SW sent message to urology nurse to get information on possible wholesale website.     Lilli Few, CHES  ID Clinic Social Work   Direct: [phone]  Main ID: [phone]

## 2019-06-09 MED ORDER — CATHETER 14 FR
11 refills | 0 days | Status: CP
Start: 2019-06-09 — End: ?

## 2019-06-09 NOTE — Unmapped (Signed)
Social Work Referral Services Note     Duration of Intervention: 15 minutes    SW received message from nurse at urology recommending website [email or web address] for pt to access low cost catheters. SW sent to pt. Pt stated he needed rx to order these and SW requested from provider and sent to pt.     Lilli Few, CHES  ID Clinic Social Work   Direct: [phone]  Main ID: [phone]

## 2019-06-14 DIAGNOSIS — B20 Human immunodeficiency virus [HIV] disease: Principal | ICD-10-CM

## 2019-06-14 DIAGNOSIS — B582 Toxoplasma meningoencephalitis: Principal | ICD-10-CM

## 2019-06-14 MED ORDER — ECONAZOLE 1 % TOPICAL CREAM
Freq: Every day | TOPICAL | 0 refills | 0 days | Status: CP
Start: 2019-06-14 — End: 2020-06-13

## 2019-06-14 MED ORDER — BICTEGRAVIR 50 MG-EMTRICITABINE 200 MG-TENOFOVIR ALAFENAM 25 MG TABLET
ORAL_TABLET | Freq: Every day | ORAL | 11 refills | 30 days | Status: CP
Start: 2019-06-14 — End: 2020-06-13

## 2019-06-14 MED ORDER — SULFAMETHOXAZOLE 800 MG-TRIMETHOPRIM 160 MG TABLET
ORAL_TABLET | Freq: Two times a day (BID) | ORAL | 5 refills | 30 days | Status: CP
Start: 2019-06-14 — End: 2019-12-11

## 2019-06-14 MED ORDER — ATORVASTATIN 20 MG TABLET
ORAL_TABLET | Freq: Every day | ORAL | 11 refills | 30.00000 days | Status: CP
Start: 2019-06-14 — End: 2020-06-13

## 2019-06-14 NOTE — Unmapped (Signed)
A financial assessment was completed to determine eligibility for patient financial assistance programs.   Completed assessment over phone with use of spanish interpreter    Household FPL: 90%  Individual FPL: 0%    Insurance  Patient reported no health insurance coverage and verified via EPIC records    [employer] (RW)   Patient is eligible for [employer] on Charges as of today. RW flag was set based on IPL for Caps on Charges.     HMAP/UMAP (Uninsured Medication Access Program) application is (choose all that apply):   NEW APPLICATION. Application is pending approval by the [facility] program for medication assistance at this time.    Other UMAP sub-program information details  -Patient can only fill at [facility] approved [facility] (Appendix B of HMAP manual)   -Only covers medications listed in the UMAP formulary (Appendix D of HMAP manual)  -You can find the HMAP manual at [email or web address]    Patient was informed about the following programs:   [facility] for financial assistance for medical and hospital bills. Patient was provided financial assistance material and referred to a [facility].     Systems analyst Eligibility Assessment  Patient is not eligible for a [employer] through the Marketplace because residence    Additional Comments:    Time Duration of intervention in minutes: 25 mins    Dorena Cookey  [facility] ID Benefits Coordinator

## 2019-06-14 NOTE — Unmapped (Signed)
Patient called in to follow up on medication delivery. Patient contacted [facility] shared services to request refill. Advised patient that he would have to refill his prescriptions through [facility] specialty for the UMAP/ HMAP program to cover this. The patient understood and is going to contact the [facility] off [address] to schedule a delivery.     Used interpreter services during this call.     Dorena Cookey  [facility] ID Benefits Coordinator    Time of intervention: 15 mins

## 2019-06-14 NOTE — Unmapped (Signed)
This patient has been disenrolled from the [facility] specialty pharmacy services due to a pharmacy change. The patient is now filling at [facility] Specialty: re: now has UHMAP.    Roderic Palau  [facility] Shared [facility] Specialty Pharmacist

## 2019-06-22 ENCOUNTER — Ambulatory Visit: Admit: 2019-06-22 | Discharge: 2019-06-22 | Attending: Urology | Primary: Urology

## 2019-06-22 ENCOUNTER — Ambulatory Visit: Admit: 2019-06-22 | Discharge: 2019-06-22

## 2019-06-22 DIAGNOSIS — B20 Human immunodeficiency virus [HIV] disease: Principal | ICD-10-CM

## 2019-07-10 ENCOUNTER — Ambulatory Visit: Payer: Self-pay

## 2019-07-10 NOTE — Progress Notes (Signed)
   Covid-19 Vaccination Clinic  Name:  Ryan Bush    MRN: [id number] DOB: [birth year]  07/10/2019  Mr. Ryan Bush was observed post Covid-19 immunization for 15 minutes without incident. He was provided with Vaccine Information Sheet and instruction to access the V-Safe system.   Mr. Ryan Bush was instructed to call 911 with any severe reactions post vaccine: Marland Kitchen Difficulty breathing  . Swelling of face and throat  . A fast heartbeat  . A bad rash all over body  . Dizziness and weakness

## 2019-07-25 ENCOUNTER — Encounter: Payer: Self-pay | Admitting: Emergency Medicine

## 2019-07-25 ENCOUNTER — Emergency Department
Admission: EM | Admit: 2019-07-25 | Discharge: 2019-07-25 | Disposition: A | Discharge status: Home / Self Care | Payer: Self-pay | Attending: Emergency Medicine | Admitting: Emergency Medicine

## 2019-07-25 ENCOUNTER — Other Ambulatory Visit: Payer: Self-pay

## 2019-07-25 DIAGNOSIS — Z79899 Other long term (current) drug therapy: Secondary | ICD-10-CM | POA: Insufficient documentation

## 2019-07-25 DIAGNOSIS — N319 Neuromuscular dysfunction of bladder, unspecified: Secondary | ICD-10-CM | POA: Insufficient documentation

## 2019-07-25 DIAGNOSIS — E119 Type 2 diabetes mellitus without complications: Secondary | ICD-10-CM | POA: Insufficient documentation

## 2019-07-25 DIAGNOSIS — B20 Human immunodeficiency virus [HIV] disease: Secondary | ICD-10-CM | POA: Insufficient documentation

## 2019-07-25 DIAGNOSIS — N390 Urinary tract infection, site not specified: Secondary | ICD-10-CM | POA: Insufficient documentation

## 2019-07-25 LAB — URINALYSIS, COMPLETE (UACMP) WITH MICROSCOPIC
Bilirubin Urine: NEGATIVE
Glucose, UA: >=500 mg/dL — AB
Hgb urine dipstick: NEGATIVE
Ketones, ur: 5 mg/dL — AB
Nitrite: NEGATIVE
Protein, ur: 100 mg/dL — AB
Specific Gravity, Urine: 1.037 — ABNORMAL HIGH (ref 1.005–1.030)
pH: 5 (ref 5.0–8.0)

## 2019-07-25 LAB — GLUCOSE, CAPILLARY: Glucose-Capillary: 261 mg/dL — ABNORMAL HIGH (ref 70–99)

## 2019-07-25 MED ORDER — CIPROFLOXACIN HCL 500 MG PO TABS: 500.0000 mg | ORAL_TABLET | Freq: Two times a day (BID) | ORAL | 0 refills | Status: DC

## 2019-07-25 NOTE — Discharge Instructions (Signed)
Call your doctor at [facility] for any continued medication or concerns.

## 2019-07-25 NOTE — ED Triage Notes (Signed)
Pt to ED via POV c/o leakage from his urinary catheter. Pt denies pain. Pt denies fevers or chills at home. Pt states that this has happened before, he was given medication for UTI. Pt is in NAD.

## 2019-07-25 NOTE — ED Provider Notes (Signed)
[facility] Emergency Department Provider Note  ____________________________________________   First MD Initiated Contact with Patient 07/25/19 [phone]     (approximate)  I have reviewed the triage vital signs and the nursing notes.   HISTORY  Chief Complaint Catheter leaking Per Spanish interpreter   HPI Ryan Bush is a 44 y.o. male presents to the ED with history of self cathing approximately 6 months.  Patient states that he also has had urination without catheterization.  Patient states last time this happened he had an infection.  He sees urologist at [facility] for neurogenic bladder and was told to make sure everything was extremely clean with self cathing.  Patient denies any fever, chills, nausea or vomiting.  Patient is a paraplegic.  He also has diabetes for which he states he does not check his blood sugar at home.  He has however continue taking his metformin twice daily.  In talking with him it does not sound that he adheres strictly to a diabetic diet.      Past Medical History:  Diagnosis Date  . HIV (human immunodeficiency virus infection) ([place])     There are no problems to display for this patient.   History reviewed. No pertinent surgical history.  Prior to Admission medications   Medication Sig Start Date End Date Taking? Authorizing Provider  ciprofloxacin (CIPRO) 500 MG tablet Take 1 tablet (500 mg total) by mouth 2 (two) times daily. 07/25/19   Johnn Hai, PA-C  metFORMIN (GLUCOPHAGE) 500 MG tablet Take 1 tablet (500 mg total) by mouth 2 (two) times daily with a meal. 04/22/19 04/21/20  Gregor Hams, MD    Allergies Patient has no known allergies.  No family history on file.  Social History Social History   Tobacco Use  . Smoking status: Never Smoker  . Smokeless tobacco: Never Used  Substance Use Topics  . Alcohol use: Never  . Drug use: Not on file    Review of Systems Constitutional: No  fever/chills Eyes: No visual changes. Cardiovascular: Denies chest pain. Respiratory: Denies shortness of breath. Gastrointestinal: No abdominal pain.  No nausea, no vomiting.  Genitourinary: Positive for dysuria.  Positive neurogenic bladder per records. Musculoskeletal: Negative for back pain. Skin: Negative for rash. Neurological: Positive paraplegic per previous records. Allergic/Immunilogical: HIV positive.  ____________________________________________   PHYSICAL EXAM:  VITAL SIGNS: ED Triage Vitals  Enc Vitals Group     BP 07/25/19 0745 (!) 151/91     Pulse Rate 07/25/19 0745 88     Resp 07/25/19 0745 16     Temp 07/25/19 0745 98.7 F (37.1 C)     Temp Source 07/25/19 0745 Oral     SpO2 07/25/19 0745 100 %     Weight --      Height 07/25/19 0746 5\' 6"  (1.676 m)     Head Circumference --      Peak Flow --      Pain Score 07/25/19 0746 0     Pain Loc --      Pain Edu? --      Excl. in [place]? --     Constitutional: Alert and oriented. Well appearing and in no acute distress. Eyes: Conjunctivae are normal.  Head: Atraumatic. Neck: No stridor.   Cardiovascular: Normal rate, regular rhythm. Grossly normal heart sounds.  Good peripheral circulation. Respiratory: Normal respiratory effort.  No retractions. Lungs CTAB. Gastrointestinal: Soft and nontender. No distention.  Musculoskeletal: Patient is paraplegic apparently since last November.  Patient is able to move upper extremities without any difficulty. Neurologic:  Normal speech and language.  Skin:  Skin is warm, dry and intact. No rash noted. Psychiatric: Mood and affect are normal. Speech and behavior are normal.  ____________________________________________   LABS (all labs ordered are listed, but only abnormal results are displayed)  Labs Reviewed  URINALYSIS, COMPLETE (UACMP) WITH MICROSCOPIC - Abnormal; Notable for the following components:      Result Value   Color, Urine YELLOW (*)    APPearance CLEAR  (*)    Specific Gravity, Urine 1.037 (*)    Glucose, UA >=500 (*)    Ketones, ur 5 (*)    Protein, ur 100 (*)    Leukocytes,Ua TRACE (*)    Bacteria, UA RARE (*)    All other components within normal limits  GLUCOSE, CAPILLARY - Abnormal; Notable for the following components:   Glucose-Capillary 261 (*)    All other components within normal limits  URINE CULTURE  CBG MONITORING, ED     PROCEDURES  Procedure(s) performed (including Critical Care):  Procedures   ____________________________________________   INITIAL IMPRESSION / ASSESSMENT AND PLAN / ED COURSE  As part of my medical decision making, I reviewed the following data within the electronic MEDICAL RECORD NUMBER Notes from prior ED visits and [place] Controlled Substance Database  44 year old paraplegic male presents to the ED with concerns of possible urinary tract infection.  Patient states that he has had some frequent urination without self-catheterization and states the last time this happened he had a urinary tract infection.  Patient denies any fever, chills, nausea or vomiting.  He is followed at [facility] urological for his neurogenic bladder.  Patient was taught procedure to in and out cath himself and was told to be extremely careful with this as he could give himself an infection.  Urinalysis and urine culture were ordered.  Patient was started on Cipro 500 mg twice daily.  In talking with him he was encouraged to stick with his diabetic diet as his blood sugar was elevated at 261.  Patient has documented glucose above 400 in the past.  It is difficult to know what foods he is eating as his conversation with the interpreter basically tells me that he does not put sugar in his coffee.  Patient is return to the emergency department if any severe worsening of his symptoms. ____________________________________________   FINAL CLINICAL IMPRESSION(S) / ED DIAGNOSES  Final diagnoses:  Acute urinary tract infection  Neurogenic  bladder     ED Discharge Orders         Ordered    ciprofloxacin (CIPRO) 500 MG tablet  2 times daily     07/25/19 0910           Note:  This document was prepared using Dragon voice recognition software and may include unintentional dictation errors.    Tommi Rumps, PA-C 07/25/19 1314    Concha Se, MD 07/26/19 1304

## 2019-07-25 NOTE — ED Notes (Signed)
With interpreter present Pt verbalized understanding of discharge instructions. NAD at this time. 

## 2019-07-27 LAB — URINE CULTURE: Culture: 50000 — AB

## 2019-07-31 ENCOUNTER — Ambulatory Visit: Payer: PRIVATE HEALTH INSURANCE | Attending: Internal Medicine

## 2019-08-07 ENCOUNTER — Ambulatory Visit: Payer: PRIVATE HEALTH INSURANCE | Attending: Internal Medicine

## 2019-08-07 ENCOUNTER — Ambulatory Visit: Payer: Self-pay

## 2019-08-07 DIAGNOSIS — Z23 Encounter for immunization: Secondary | ICD-10-CM

## 2019-08-07 NOTE — Progress Notes (Signed)
   Covid-19 Vaccination Clinic  Name:  Ryan Bush    MRN: [id number] DOB: 01-18-1976  08/07/2019  Mr. Ryan Bush was observed post Covid-19 immunization for 15 minutes without incident. He was provided with Vaccine Information Sheet and instruction to access the V-Safe system.   Mr. Ryan Bush was instructed to call 911 with any severe reactions post vaccine: Marland Kitchen Difficulty breathing  . Swelling of face and throat  . A fast heartbeat  . A bad rash all over body  . Dizziness and weakness   Immunizations Administered    Name Date Dose VIS Date Route   Pfizer COVID-19 Vaccine 08/07/2019  3:26 PM 0.3 mL 04/02/2019 Intramuscular   Manufacturer: [employer]   Lot: [id number]   NDC: [address]-8

## 2019-08-30 ENCOUNTER — Ambulatory Visit: Admit: 2019-08-30 | Discharge: 2019-08-31 | Payer: PRIVATE HEALTH INSURANCE | Attending: Urology | Primary: Urology

## 2019-08-30 DIAGNOSIS — G0489 Other myelitis: Secondary | ICD-10-CM | POA: Diagnosis not present

## 2019-08-30 DIAGNOSIS — Z9189 Other specified personal risk factors, not elsewhere classified: Secondary | ICD-10-CM | POA: Diagnosis not present

## 2019-08-30 DIAGNOSIS — N319 Neuromuscular dysfunction of bladder, unspecified: Secondary | ICD-10-CM | POA: Diagnosis not present

## 2019-09-17 ENCOUNTER — Other Ambulatory Visit: Payer: Self-pay | Admitting: *Deleted

## 2019-09-17 DIAGNOSIS — N319 Neuromuscular dysfunction of bladder, unspecified: Secondary | ICD-10-CM

## 2019-09-21 ENCOUNTER — Other Ambulatory Visit: Payer: Self-pay

## 2019-09-21 ENCOUNTER — Encounter: Payer: Self-pay | Admitting: Urology

## 2019-09-21 ENCOUNTER — Ambulatory Visit (INDEPENDENT_AMBULATORY_CARE_PROVIDER_SITE_OTHER): Payer: 59 | Admitting: Urology

## 2019-09-21 VITALS — BP 147/90 | HR 91

## 2019-09-21 DIAGNOSIS — N319 Neuromuscular dysfunction of bladder, unspecified: Secondary | ICD-10-CM

## 2019-09-21 NOTE — Progress Notes (Signed)
Patient ID: Ryan Bush, male   DOB: 1975-12-04, 44 y.o.   MRN: [id number] Patient using interpreter services [phone]

## 2019-09-21 NOTE — Progress Notes (Signed)
   09/21/19 2:09 PM   Fredrik Cove 06-22-1975 [id number]  CC: Neurogenic bladder  HPI: I saw Mr. Charna Busman in urology clinic today for neurogenic bladder.  Today's visit was conducted via a Patent attorney.  He was previously followed by [facility], but his insurance recently changed.  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  This has been managed with CIC 4 times per day.  He was most recently seen at [facility] on 08/30/2019 and urodynamics were performed.  This showed decreased sensation, increased capacity, normal compliance, and no stress incontinence or detrusor overactivity.  There was no reflux seen.  He has had 2 UTIs since starting CIC with symptoms of pelvic pressure and incontinence.  PMH: Past Medical History:  Diagnosis Date  . HIV (human immunodeficiency virus infection) ([place])     Social History:  reports that he has never smoked. He has never used smokeless tobacco. He reports that he does not drink alcohol. No history on file for drug.  Physical Exam: BP (!) 147/90   Pulse 91    Constitutional: Alert, in wheelchair Cardiovascular: No clubbing, cyanosis, or edema. Respiratory: Normal respiratory effort, no increased work of breathing. GI: Abdomen is soft, nontender, nondistended, no abdominal masses  Laboratory Data: Reviewed Most recent renal function creatinine 0.4, EGFR greater than 60.  Assessment & Plan:   In summary, is a 44 year old male with HIV diagnosed with CNS toxoplasmosis in August 2020 resulting in lower extremity paralysis and neurogenic bladder currently managed with CIC 4 times per day.  Urodynamics at [facility] in May 2021 showed increased bladder capacity, decreased sensation, normal compliance, no detrusor overactivity, and atonic bladder.  We discussed at length that our goal regarding his neurogenic  bladder is to protect the upper tracts and renal function, minimize UTIs, and maximize patient quality of life and bladder management.  Continue CIC 4 times per day  Discussed return precautions and UTI symptoms RTC 1 year with renal ultrasound prior  I spent 60 total minutes on the day of the encounter including pre-visit review of the medical record, face-to-face time with the patient, and post visit ordering of labs/imaging/tests.  Nickolas Madrid, MD 09/21/2019  [facility] [address] [place], [place] [address] [phone]

## 2019-10-04 ENCOUNTER — Ambulatory Visit: Admit: 2019-10-04 | Discharge: 2019-10-05 | Payer: PRIVATE HEALTH INSURANCE

## 2019-10-04 DIAGNOSIS — N3000 Acute cystitis without hematuria: Secondary | ICD-10-CM | POA: Diagnosis not present

## 2019-10-04 DIAGNOSIS — Z23 Encounter for immunization: Secondary | ICD-10-CM | POA: Diagnosis not present

## 2019-10-04 DIAGNOSIS — R69 Illness, unspecified: Secondary | ICD-10-CM | POA: Diagnosis not present

## 2019-10-04 DIAGNOSIS — B20 Human immunodeficiency virus [HIV] disease: Principal | ICD-10-CM

## 2019-10-04 MED ORDER — ECONAZOLE 1 % TOPICAL CREAM
Freq: Two times a day (BID) | TOPICAL | 3 refills | 0.00000 days | Status: CP
Start: 2019-10-04 — End: 2020-10-03

## 2019-10-05 DIAGNOSIS — R339 Retention of urine, unspecified: Secondary | ICD-10-CM | POA: Diagnosis not present

## 2019-10-05 DIAGNOSIS — D72810 Lymphocytopenia: Principal | ICD-10-CM

## 2019-10-07 ENCOUNTER — Emergency Department
Admission: EM | Admit: 2019-10-07 | Discharge: 2019-10-07 | Disposition: A | Discharge status: Home / Self Care | Payer: 59 | Attending: Emergency Medicine | Admitting: Emergency Medicine

## 2019-10-07 ENCOUNTER — Other Ambulatory Visit: Payer: Self-pay

## 2019-10-07 DIAGNOSIS — G8220 Paraplegia, unspecified: Secondary | ICD-10-CM | POA: Diagnosis not present

## 2019-10-07 DIAGNOSIS — N390 Urinary tract infection, site not specified: Secondary | ICD-10-CM | POA: Diagnosis not present

## 2019-10-07 DIAGNOSIS — Z7982 Long term (current) use of aspirin: Secondary | ICD-10-CM | POA: Diagnosis not present

## 2019-10-07 DIAGNOSIS — Z79899 Other long term (current) drug therapy: Secondary | ICD-10-CM | POA: Diagnosis not present

## 2019-10-07 DIAGNOSIS — N319 Neuromuscular dysfunction of bladder, unspecified: Secondary | ICD-10-CM

## 2019-10-07 DIAGNOSIS — R69 Illness, unspecified: Secondary | ICD-10-CM | POA: Diagnosis not present

## 2019-10-07 DIAGNOSIS — Z21 Asymptomatic human immunodeficiency virus [HIV] infection status: Secondary | ICD-10-CM | POA: Insufficient documentation

## 2019-10-07 DIAGNOSIS — R3989 Other symptoms and signs involving the genitourinary system: Secondary | ICD-10-CM | POA: Diagnosis present

## 2019-10-07 LAB — URINALYSIS, COMPLETE (UACMP) WITH MICROSCOPIC
Bilirubin Urine: NEGATIVE
Glucose, UA: >=500 mg/dL — AB
Hgb urine dipstick: NEGATIVE
Ketones, ur: 5 mg/dL — AB
Leukocytes,Ua: NEGATIVE
Nitrite: NEGATIVE
Protein, ur: 30 mg/dL — AB
Specific Gravity, Urine: 1.029 (ref 1.005–1.030)
pH: 5 (ref 5.0–8.0)

## 2019-10-07 MED ORDER — CIPROFLOXACIN HCL 500 MG PO TABS: 500.0000 mg | ORAL_TABLET | Freq: Two times a day (BID) | ORAL | 0 refills | Status: AC

## 2019-10-07 MED ORDER — CIPROFLOXACIN HCL 500 MG PO TABS
500.0000 mg | ORAL_TABLET | Freq: Once | ORAL | Status: AC
  Administered 2019-10-07: 500 mg via ORAL
  Filled 2019-10-07: qty 1

## 2019-10-07 NOTE — ED Provider Notes (Signed)
[facility] Emergency Department Provider Note  ____________________________________________  Time seen: Approximately 5:03 PM  I have reviewed the triage vital signs and the nursing notes.   HISTORY  Chief Complaint Urinary Tract Infection    HPI Ryan Bush is a 44 y.o. male who presents the emergency department complaining of urinary pressure, decreased urinary output.  Patient is a paraplegic, self catheterizes.  Patient does have episodes of similar symptoms and has been treated in the past with antibiotics for urinary tract infection.  Patient is followed at [facility] for his neurogenic bladder.  Patient states that the last time this occurred was approximately 2 months ago he was treated with antibiotics with good symptomatic improvement.  He denies any fevers or chills, flank pain, hematuria, abdominal pain.  No nausea or vomiting.         Past Medical History:  Diagnosis Date  . HIV (human immunodeficiency virus infection) (HCC)     There are no problems to display for this patient.   History reviewed. No pertinent surgical history.  Prior to Admission medications   Medication Sig Start Date End Date Taking? Authorizing Provider  atorvastatin (LIPITOR) 20 MG tablet Take 20 mg by mouth daily. 09/06/19   [provider]  bictegravir-emtricitabine-tenofovir AF (BIKTARVY) 50-200-25 MG TABS tablet Take 1 tablet by mouth daily. 06/14/19 06/13/20  [provider]  ciprofloxacin (CIPRO) 500 MG tablet Take 1 tablet (500 mg total) by mouth 2 (two) times daily for 10 days. 10/07/19 10/17/19  Zaray Gatchel, Delorise Royals, PA-C  metFORMIN (GLUCOPHAGE) 500 MG tablet Take 1 tablet (500 mg total) by mouth 2 (two) times daily with a meal. 04/22/19 04/21/20  Darci Current, MD  sulfamethoxazole-trimethoprim (BACTRIM DS) 800-160 MG tablet Take 1 tablet by mouth in the morning and at bedtime. 06/14/19 12/11/19  [provider]     Allergies Patient has no known allergies.  History reviewed. No pertinent family history.  Social History Social History   Tobacco Use  . Smoking status: Never Smoker  . Smokeless tobacco: Never Used  Substance Use Topics  . Alcohol use: Never  . Drug use: Not on file     Review of Systems  Constitutional: No fever/chills Eyes: No visual changes. No discharge ENT: No upper respiratory complaints. Cardiovascular: no chest pain. Respiratory: no cough. No SOB. Gastrointestinal: No abdominal pain.  No nausea, no vomiting.  No diarrhea.  No constipation. Genitourinary: Neurogenic bladder with daily self catheterization.  No hematuria.  Urinary pressure, decreased urination. Musculoskeletal: Negative for musculoskeletal pain. Skin: Negative for rash, abrasions, lacerations, ecchymosis. Neurological: Negative for headaches, focal weakness or numbness. 10-point ROS otherwise negative.  ____________________________________________   PHYSICAL EXAM:  VITAL SIGNS: ED Triage Vitals  Enc Vitals Group     BP 10/07/19 1610 138/89     Pulse Rate 10/07/19 1610 78     Resp 10/07/19 1610 18     Temp 10/07/19 1610 99 F (37.2 C)     Temp Source 10/07/19 1610 Oral     SpO2 10/07/19 1610 99 %     Weight 10/07/19 1609 140 lb (63.5 kg)     Height 10/07/19 1609 5\' 4"  (1.626 m)     Head Circumference --      Peak Flow --      Pain Score 10/07/19 1609 6     Pain Loc --      Pain Edu? --      Excl. in GC? --  Constitutional: Alert and oriented. Well appearing and in no acute distress. Eyes: Conjunctivae are normal. PERRL. EOMI. Head: Atraumatic. ENT:      Ears:       Nose: No congestion/rhinnorhea.      Mouth/Throat: Mucous membranes are moist.  Neck: No stridor.    Cardiovascular: Normal rate, regular rhythm. Normal S1 and S2.  Good peripheral circulation. Respiratory: Normal respiratory effort without tachypnea or retractions. Lungs CTAB. Good air entry to the bases  with no decreased or absent breath sounds. Gastrointestinal: Bowel sounds 4 quadrants. Soft and nontender to palpation. No guarding or rigidity. No palpable masses. No distention. No CVA tenderness. Musculoskeletal: Full range of motion to all extremities. No gross deformities appreciated. Neurologic:  Normal speech and language. No gross focal neurologic deficits are appreciated.  Skin:  Skin is warm, dry and intact. No rash noted. Psychiatric: Mood and affect are normal. Speech and behavior are normal. Patient exhibits appropriate insight and judgement.   ____________________________________________   LABS (all labs ordered are listed, but only abnormal results are displayed)  Labs Reviewed  URINALYSIS, COMPLETE (UACMP) WITH MICROSCOPIC - Abnormal; Notable for the following components:      Result Value   Color, Urine YELLOW (*)    APPearance CLEAR (*)    Glucose, UA >=500 (*)    Ketones, ur 5 (*)    Protein, ur 30 (*)    Bacteria, UA RARE (*)    All other components within normal limits  URINE CULTURE   ____________________________________________  EKG   ____________________________________________  RADIOLOGY   No results found.  ____________________________________________    PROCEDURES  Procedure(s) performed:    Procedures    Medications  ciprofloxacin (CIPRO) tablet 500 mg (has no administration in time range)     ____________________________________________   INITIAL IMPRESSION / ASSESSMENT AND PLAN / ED COURSE  Pertinent labs & imaging results that were available during my care of the patient were reviewed by me and considered in my medical decision making (see chart for details).  Review of the [place] CSRS was performed in accordance of the [place] prior to dispensing any controlled drugs.           Patient's diagnosis is consistent with urinary tract infection, neurogenic bladder.  Patient with neurogenic bladder and paraplegic.  Patient with  increased bladder pressure, decreased urination.  Patient states that the symptoms are consistent with UTIs.  Patient did have some rare bacteria but no other significant findings on urinalysis.  Patient has been treated in the past with these findings with antibiotic with good symptomatic improvement.  Given patient's symptoms, the finding of bacteria I will treat for urinary tract infection though I am not entirely convinced this is true UTI without leukocytes and nitrites.  I will culture the urine as well.  Follow-up with urology at [facility] if patient's symptoms persist... Patient is given ED precautions to return to the ED for any worsening or new symptoms.     ____________________________________________  FINAL CLINICAL IMPRESSION(S) / ED DIAGNOSES  Final diagnoses:  Lower urinary tract infectious disease  Neurogenic bladder      NEW MEDICATIONS STARTED DURING THIS VISIT:  ED Discharge Orders         Ordered    ciprofloxacin (CIPRO) 500 MG tablet  2 times daily     Discontinue  Reprint     10/07/19 1808              This chart was dictated using voice recognition software/Dragon.  Despite best efforts to proofread, errors can occur which can change the meaning. Any change was purely unintentional.    Racheal Patches, PA-C 10/07/19 1809    Phineas Semen, MD 10/07/19 1810

## 2019-10-07 NOTE — ED Triage Notes (Signed)
Pt comes POV with UTI symptoms for 2 days. Interpreter used for triage.

## 2019-10-07 NOTE — ED Notes (Signed)
174 ml of urine was scanned. In and Out was completed removing urine from bladder. Urine was sent to lab.

## 2019-10-07 NOTE — ED Notes (Signed)
Pt self-caths for urine and does not want to until 6pm

## 2019-10-07 NOTE — ED Notes (Signed)
See triage note  Presents thinking he may have a bladder infection  States he self caths and is only getting small amts of urine return  Low grade temp on arrival

## 2019-10-09 LAB — URINE CULTURE: Culture: 10000 — AB

## 2019-10-15 ENCOUNTER — Ambulatory Visit: Admit: 2019-10-15 | Discharge: 2019-10-16 | Payer: PRIVATE HEALTH INSURANCE

## 2019-10-15 DIAGNOSIS — D72810 Lymphocytopenia: Secondary | ICD-10-CM | POA: Diagnosis not present

## 2019-11-01 ENCOUNTER — Ambulatory Visit
Admit: 2019-11-01 | Discharge: 2019-11-02 | Payer: PRIVATE HEALTH INSURANCE | Attending: Spinal Cord Injury Medicine | Primary: Spinal Cord Injury Medicine

## 2019-11-01 DIAGNOSIS — Z6825 Body mass index (BMI) 25.0-25.9, adult: Secondary | ICD-10-CM | POA: Diagnosis not present

## 2019-11-01 DIAGNOSIS — G8222 Paraplegia, incomplete: Secondary | ICD-10-CM | POA: Diagnosis not present

## 2019-11-05 DIAGNOSIS — R339 Retention of urine, unspecified: Secondary | ICD-10-CM | POA: Diagnosis not present

## 2019-11-13 ENCOUNTER — Encounter: Payer: Self-pay | Admitting: Emergency Medicine

## 2019-11-13 ENCOUNTER — Emergency Department
Admission: EM | Admit: 2019-11-13 | Discharge: 2019-11-13 | Disposition: A | Discharge status: Home / Self Care | Payer: 59 | Attending: Emergency Medicine | Admitting: Emergency Medicine

## 2019-11-13 ENCOUNTER — Other Ambulatory Visit: Payer: Self-pay

## 2019-11-13 DIAGNOSIS — B9689 Other specified bacterial agents as the cause of diseases classified elsewhere: Secondary | ICD-10-CM | POA: Insufficient documentation

## 2019-11-13 DIAGNOSIS — R309 Painful micturition, unspecified: Secondary | ICD-10-CM | POA: Diagnosis present

## 2019-11-13 DIAGNOSIS — N390 Urinary tract infection, site not specified: Secondary | ICD-10-CM

## 2019-11-13 DIAGNOSIS — B20 Human immunodeficiency virus [HIV] disease: Secondary | ICD-10-CM | POA: Insufficient documentation

## 2019-11-13 DIAGNOSIS — R69 Illness, unspecified: Secondary | ICD-10-CM | POA: Diagnosis not present

## 2019-11-13 DIAGNOSIS — Z79899 Other long term (current) drug therapy: Secondary | ICD-10-CM | POA: Insufficient documentation

## 2019-11-13 LAB — URINALYSIS, COMPLETE (UACMP) WITH MICROSCOPIC
Bacteria, UA: NONE SEEN
Bilirubin Urine: NEGATIVE
Glucose, UA: >=500 mg/dL — AB
Hgb urine dipstick: NEGATIVE
Ketones, ur: 20 mg/dL — AB
Nitrite: NEGATIVE
Protein, ur: 30 mg/dL — AB
Specific Gravity, Urine: 1.037 — ABNORMAL HIGH (ref 1.005–1.030)
pH: 5 (ref 5.0–8.0)

## 2019-11-13 MED ORDER — CEPHALEXIN 500 MG CAPSULE
ORAL | 0 days
Start: 2019-11-13 — End: ?

## 2019-11-13 MED ORDER — CEPHALEXIN 500 MG PO CAPS: 500.0000 mg | ORAL_CAPSULE | Freq: Three times a day (TID) | ORAL | 0 refills | Status: AC

## 2019-11-13 NOTE — ED Triage Notes (Signed)
Ryan Bush, Interpreter in triage at this time.   Pt presents to ED, wheelchair bound at baseline. Pt states self caths every 6hrs, states has noticed dripping into brief before he is performing self cath. Pt states self cath's due to hip problems that prevent him from urinating and having BM normally. Pt denies burning with urination.

## 2019-11-13 NOTE — ED Provider Notes (Signed)
[facility] Emergency Department Provider Note   ____________________________________________   First MD Initiated Contact with Patient 11/13/19 [phone]     (approximate)  I have reviewed the triage vital signs and the nursing notes.   HISTORY  Chief Complaint Urinary Frequency Per Spanish interpreter   HPI Ryan Bush is a 44 y.o. male patient presents to the ED with complaint of some discomfort with urination.  Patient is wheelchair-bound and self caths approximately every 6 hours.  Patient reports that he does throw each catheter away after using them.  He denies any reason for STIs as he is not sexually active.      Past Medical History:  Diagnosis Date   HIV (human immunodeficiency virus infection) (HCC)     There are no problems to display for this patient.   History reviewed. No pertinent surgical history.  Prior to Admission medications   Medication Sig Start Date End Date Taking? Authorizing Provider  atorvastatin (LIPITOR) 20 MG tablet Take 20 mg by mouth daily. 09/06/19   [provider]  bictegravir-emtricitabine-tenofovir AF (BIKTARVY) 50-200-25 MG TABS tablet Take 1 tablet by mouth daily. 06/14/19 06/13/20  [provider]  cephALEXin (KEFLEX) 500 MG capsule Take 1 capsule (500 mg total) by mouth 3 (three) times daily. 11/13/19   Tommi Rumps, PA-C  metFORMIN (GLUCOPHAGE) 500 MG tablet Take 1 tablet (500 mg total) by mouth 2 (two) times daily with a meal. 04/22/19 04/21/20  Darci Current, MD  sulfamethoxazole-trimethoprim (BACTRIM DS) 800-160 MG tablet Take 1 tablet by mouth in the morning and at bedtime. 06/14/19 12/11/19  [provider]    Allergies Patient has no known allergies.  History reviewed. No pertinent family history.  Social History Social History   Tobacco Use   Smoking status: Never Smoker   Smokeless tobacco: Never Used  Substance Use Topics   Alcohol use: Never     Drug use: Not on file    Review of Systems  Constitutional: No fever/chills Cardiovascular: Denies chest pain. Respiratory: Denies shortness of breath. Gastrointestinal: No abdominal pain.  No nausea, no vomiting.  Genitourinary: Positive for dysuria. Musculoskeletal: Negative for muscle skeletal pain. Skin: Negative for rash. Neurological: Negative for headaches, focal weakness or numbness.   ____________________________________________   PHYSICAL EXAM:  VITAL SIGNS: ED Triage Vitals [11/13/19 0918]  Enc Vitals Group     BP (!) 163/104     Pulse Rate 79     Resp 18     Temp 98.1 F (36.7 C)     Temp Source Oral     SpO2 99 %     Weight 139 lb 15.9 oz (63.5 kg)     Height 5\' 4"  (1.626 m)     Head Circumference      Peak Flow      Pain Score 0     Pain Loc      Pain Edu?      Excl. in GC?     Constitutional: Alert and oriented. Well appearing and in no acute distress. Eyes: Conjunctivae are normal. PERRL. EOMI. Head: Atraumatic. Neck: No stridor.   Cardiovascular: Normal rate, regular rhythm. Grossly normal heart sounds.  Good peripheral circulation. Respiratory: Normal respiratory effort.  No retractions. Lungs CTAB. Neurologic:  Normal speech and language. No gross focal neurologic deficits are appreciated. Skin:  Skin is warm, dry and intact.  Psychiatric: Mood and affect are normal. Speech and behavior are normal.  ____________________________________________   LABS (all  labs ordered are listed, but only abnormal results are displayed)  Labs Reviewed  URINALYSIS, COMPLETE (UACMP) WITH MICROSCOPIC - Abnormal; Notable for the following components:      Result Value   Color, Urine YELLOW (*)    APPearance CLEAR (*)    Specific Gravity, Urine 1.037 (*)    Glucose, UA >=500 (*)    Ketones, ur 20 (*)    Protein, ur 30 (*)    Leukocytes,Ua TRACE (*)    All other components within normal limits  URINE CULTURE    PROCEDURES  Procedure(s) performed  (including Critical Care):  Procedures   ____________________________________________   INITIAL IMPRESSION / ASSESSMENT AND PLAN / ED COURSE  As part of my medical decision making, I reviewed the following data within the electronic MEDICAL RECORD NUMBER Notes from prior ED visits and [place] Controlled Substance Database  44 year old male with a history of being wheelchair-bound and self cathing at home presents to the ED with complaint of some dysuria.  Patient currently takes 1 Bactrim DS at bedtime for prevention of UTIs.  Urinalysis is suggestive of a urinary tract infection.  A culture was ordered and patient was placed on Keflex 500 mg 3 times daily for 10 days.  He is also to follow-up with his PCP if any continued problems.  He was encouraged to drink fluids frequently which he tells the interpreter he does.  His return to the emergency department if any worsening of his symptoms. ____________________________________________   FINAL CLINICAL IMPRESSION(S) / ED DIAGNOSES  Final diagnoses:  Acute urinary tract infection     ED Discharge Orders         Ordered    cephALEXin (KEFLEX) 500 MG capsule  3 times daily     Discontinue  Reprint     11/13/19 1142           Note:  This document was prepared using Dragon voice recognition software and may include unintentional dictation errors.    Tommi Rumps, PA-C 11/13/19 1332    Minna Antis, MD 11/13/19 [phone]

## 2019-11-13 NOTE — Discharge Instructions (Addendum)
Follow-up with your primary care provider if any continued problems or concerns. Begin taking antibiotics as directed until completely gone.

## 2019-11-16 LAB — URINE CULTURE: Culture: >=100000 — AB

## 2019-12-03 DIAGNOSIS — R339 Retention of urine, unspecified: Secondary | ICD-10-CM | POA: Diagnosis not present

## 2020-01-04 DIAGNOSIS — R339 Retention of urine, unspecified: Secondary | ICD-10-CM | POA: Diagnosis not present

## 2020-02-01 DIAGNOSIS — R339 Retention of urine, unspecified: Secondary | ICD-10-CM | POA: Diagnosis not present

## 2020-02-03 ENCOUNTER — Ambulatory Visit
Admit: 2020-02-03 | Discharge: 2020-02-04 | Payer: PRIVATE HEALTH INSURANCE | Attending: Spinal Cord Injury Medicine | Primary: Spinal Cord Injury Medicine

## 2020-02-03 DIAGNOSIS — Z6825 Body mass index (BMI) 25.0-25.9, adult: Secondary | ICD-10-CM | POA: Diagnosis not present

## 2020-02-03 DIAGNOSIS — N319 Neuromuscular dysfunction of bladder, unspecified: Secondary | ICD-10-CM | POA: Diagnosis not present

## 2020-02-03 DIAGNOSIS — G8222 Paraplegia, incomplete: Secondary | ICD-10-CM | POA: Diagnosis not present

## 2020-02-07 DIAGNOSIS — N3001 Acute cystitis with hematuria: Principal | ICD-10-CM

## 2020-02-07 MED ORDER — NITROFURANTOIN MONOHYDRATE/MACROCRYSTALS 100 MG CAPSULE: 100 mg | capsule | Freq: Two times a day (BID) | 0 refills | 7 days | Status: AC

## 2020-02-07 MED ORDER — NITROFURANTOIN MONOHYDRATE/MACROCRYSTALS 100 MG CAPSULE
ORAL_CAPSULE | Freq: Two times a day (BID) | ORAL | 0 refills | 7.00000 days | Status: CP
Start: 2020-02-07 — End: 2020-02-07

## 2020-02-18 ENCOUNTER — Ambulatory Visit: Admit: 2020-02-18 | Discharge: 2020-02-19 | Payer: PRIVATE HEALTH INSURANCE

## 2020-02-18 DIAGNOSIS — N319 Neuromuscular dysfunction of bladder, unspecified: Secondary | ICD-10-CM | POA: Diagnosis not present

## 2020-02-22 DIAGNOSIS — G8222 Paraplegia, incomplete: Principal | ICD-10-CM

## 2020-02-22 MED ORDER — CEFDINIR 300 MG CAPSULE
ORAL_CAPSULE | Freq: Two times a day (BID) | ORAL | 0 refills | 10.00000 days | Status: CP
Start: 2020-02-22 — End: 2020-02-23

## 2020-02-23 DIAGNOSIS — N390 Urinary tract infection, site not specified: Principal | ICD-10-CM

## 2020-02-23 DIAGNOSIS — G8222 Paraplegia, incomplete: Principal | ICD-10-CM

## 2020-02-23 DIAGNOSIS — N3001 Acute cystitis with hematuria: Principal | ICD-10-CM

## 2020-02-23 MED ORDER — CEFDINIR 300 MG CAPSULE
ORAL_CAPSULE | Freq: Two times a day (BID) | ORAL | 0 refills | 10 days | Status: CP
Start: 2020-02-23 — End: 2020-03-04

## 2020-02-28 ENCOUNTER — Other Ambulatory Visit: Payer: Self-pay

## 2020-02-28 ENCOUNTER — Encounter: Payer: Self-pay | Admitting: Physical Therapy

## 2020-02-28 ENCOUNTER — Ambulatory Visit: Payer: 59 | Attending: Physical Medicine and Rehabilitation | Admitting: Physical Therapy

## 2020-02-28 DIAGNOSIS — R262 Difficulty in walking, not elsewhere classified: Secondary | ICD-10-CM | POA: Diagnosis not present

## 2020-02-28 DIAGNOSIS — M6281 Muscle weakness (generalized): Secondary | ICD-10-CM | POA: Diagnosis not present

## 2020-02-28 NOTE — Therapy (Signed)
[place] [facility] MAIN [facility] SERVICES [address] [place], [place], [address] Phone: [phone]   Fax:  [phone]  Physical Therapy Evaluation  Patient Details  Name: Ryan Bush MRN: [id number] Date of Birth: 1975/09/29 Referring Provider (PT): Felton Clinton   Encounter Date: 02/28/2020   PT End of Session - 02/28/20 1439    Visit Number 1    Number of Visits 1    PT Start Time 0100    PT Stop Time 0200    PT Time Calculation (min) 60 min    Equipment Utilized During Treatment Gait belt    Activity Tolerance Patient tolerated treatment well    Behavior During Therapy WFL for tasks assessed/performed           Past Medical History:  Diagnosis Date  . HIV (human immunodeficiency virus infection) (HCC)     History reviewed. No pertinent surgical history.  There were no vitals filed for this visit.    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 Patient had PT and OT in the hospital for 3 months. He was DC from PT due to his insurance running out and he needed a different kind of PT. He was discharged home and he had HHPT 2 x week for 1 month. He was dischaged from HHPT and was supposed to get out patient PT but his insurance was cancelled. He has a new insurance now and has [place].    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 Patient had PT and OT in the hospital for 3 months. He was DC from PT due to his insurance running out and he needed a different kind of PT. He was discharged home and he had HHPT 2 x week for 1 month. He was dischaged from HHPT and was supposed to get out patient PT but his insurance was cancelled. He has a new insurance now and has [place].    How long can you sit comfortably? unlinited    How long can you stand comfortably?  unable    How long can you walk comfortably? unable    Patient Stated Goals to walk    Currently in Pain? No/denies    Multiple Pain Sites No              [facility] PT Assessment - 02/28/20 1436      Assessment   Medical Diagnosis spinal cord injury T 10     Referring Provider (PT) Felton Clinton    Onset Date/Surgical Date --   01/2019   Prior Therapy yes      Precautions   Precautions None      Restrictions   Weight Bearing Restrictions No      Balance Screen   Has the patient fallen in the past 6 months No    Has the patient had a decrease in activity level because of a fear of falling?  Yes    Is the patient reluctant to leave their home because of a fear of falling?  No      Home Nurse, mental health Private residence    Living Arrangements Spouse/significant other;Children    Available Help at Discharge [facility] - manual    Additional Comments sliding board      Prior Function  Level of Independence Independent    Vocation Unemployed    Leisure has 44 year old, 58 year old daughters      Cognition   Overall Cognitive Status Within Functional Limits for tasks assessed                      Objective measurements completed on examination: See above findings.               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 He had HHPT for 1 month and now has new insurance and wants to continue his rehabilitation. He has poor strength RLE knee flex and RLE ankle DF and RLE hip flex. He has trace L great toe strength. He is independent with transfers with wc to all surfaces including his bed, car and  comode. He has 5/5 BUE strength and good static and dynamic sitting balance. He wants to wait until the beginning of the year to begin his rehab due to high deductible to reach. He will be seen for evaluation only today and start his PT in 2022.    Stability/Clinical Decision Making Stable/Uncomplicated    Clinical Decision Making Moderate    Rehab Potential Fair    PT Frequency One time visit    Consulted and Agree with Plan of Care Patient           Patient will benefit from skilled therapeutic intervention in order to improve the following deficits and impairments:     Visit Diagnosis: Muscle weakness (generalized)  Difficulty in walking, not elsewhere classified     Problem List There are no problems to display for this patient.   [address], [place] DPT 02/28/2020, 2:54 PM  [place] [facility] MAIN [facility] SERVICES [address] [place], [place], [address] Phone: [phone]   Fax:  [phone]  Name: Lynden Flemmer MRN: [id number] Date of Birth: [birth year]

## 2020-03-01 ENCOUNTER — Ambulatory Visit: Payer: 59 | Admitting: Physical Therapy

## 2020-03-01 DIAGNOSIS — R339 Retention of urine, unspecified: Secondary | ICD-10-CM | POA: Diagnosis not present

## 2020-03-07 ENCOUNTER — Ambulatory Visit: Payer: 59 | Admitting: Physical Therapy

## 2020-03-14 ENCOUNTER — Ambulatory Visit: Payer: 59 | Admitting: Physical Therapy

## 2020-03-21 ENCOUNTER — Ambulatory Visit: Payer: 59

## 2020-03-23 ENCOUNTER — Ambulatory Visit: Payer: 59

## 2020-03-28 ENCOUNTER — Ambulatory Visit: Payer: 59

## 2020-03-30 ENCOUNTER — Ambulatory Visit: Payer: 59

## 2020-03-30 DIAGNOSIS — R339 Retention of urine, unspecified: Secondary | ICD-10-CM | POA: Diagnosis not present

## 2020-04-03 ENCOUNTER — Ambulatory Visit: Admit: 2020-04-03 | Discharge: 2020-04-03 | Payer: PRIVATE HEALTH INSURANCE

## 2020-04-03 DIAGNOSIS — G0489 Other myelitis: Secondary | ICD-10-CM | POA: Diagnosis not present

## 2020-04-03 DIAGNOSIS — I70269 Atherosclerosis of native arteries of extremities with gangrene, unspecified extremity: Secondary | ICD-10-CM | POA: Diagnosis not present

## 2020-04-03 DIAGNOSIS — R69 Illness, unspecified: Secondary | ICD-10-CM | POA: Diagnosis not present

## 2020-04-03 DIAGNOSIS — B353 Tinea pedis: Secondary | ICD-10-CM | POA: Diagnosis not present

## 2020-04-03 DIAGNOSIS — G939 Disorder of brain, unspecified: Secondary | ICD-10-CM | POA: Diagnosis not present

## 2020-04-03 DIAGNOSIS — M533 Sacrococcygeal disorders, not elsewhere classified: Secondary | ICD-10-CM | POA: Diagnosis not present

## 2020-04-03 DIAGNOSIS — N319 Neuromuscular dysfunction of bladder, unspecified: Secondary | ICD-10-CM | POA: Diagnosis not present

## 2020-04-03 DIAGNOSIS — Z7982 Long term (current) use of aspirin: Secondary | ICD-10-CM | POA: Diagnosis not present

## 2020-04-03 DIAGNOSIS — N390 Urinary tract infection, site not specified: Secondary | ICD-10-CM | POA: Diagnosis not present

## 2020-04-03 DIAGNOSIS — Z23 Encounter for immunization: Secondary | ICD-10-CM | POA: Diagnosis not present

## 2020-04-03 DIAGNOSIS — Z882 Allergy status to sulfonamides status: Secondary | ICD-10-CM | POA: Diagnosis not present

## 2020-04-03 DIAGNOSIS — B20 Human immunodeficiency virus [HIV] disease: Principal | ICD-10-CM

## 2020-04-03 MED ORDER — FOSFOMYCIN TROMETHAMINE 3 GRAM ORAL PACKET
ORAL | 0 refills | 12.00000 days | Status: CP
Start: 2020-04-03 — End: 2020-04-13

## 2020-04-04 ENCOUNTER — Ambulatory Visit: Payer: 59

## 2020-04-05 ENCOUNTER — Ambulatory Visit: Payer: 59 | Admitting: Physical Therapy

## 2020-04-06 ENCOUNTER — Ambulatory Visit: Payer: 59

## 2020-04-10 ENCOUNTER — Ambulatory Visit: Payer: 59

## 2020-04-12 ENCOUNTER — Ambulatory Visit: Payer: 59

## 2020-04-17 ENCOUNTER — Ambulatory Visit: Payer: 59

## 2020-04-19 ENCOUNTER — Ambulatory Visit: Admit: 2020-04-19 | Discharge: 2020-04-20 | Payer: PRIVATE HEALTH INSURANCE

## 2020-04-19 ENCOUNTER — Ambulatory Visit
Admit: 2020-04-19 | Discharge: 2020-04-20 | Payer: PRIVATE HEALTH INSURANCE | Attending: Student in an Organized Health Care Education/Training Program | Primary: Student in an Organized Health Care Education/Training Program

## 2020-04-19 DIAGNOSIS — R9341 Abnormal radiologic findings on diagnostic imaging of renal pelvis, ureter, or bladder: Secondary | ICD-10-CM | POA: Diagnosis not present

## 2020-04-19 DIAGNOSIS — Z7984 Long term (current) use of oral hypoglycemic drugs: Secondary | ICD-10-CM | POA: Diagnosis not present

## 2020-04-19 DIAGNOSIS — E1165 Type 2 diabetes mellitus with hyperglycemia: Secondary | ICD-10-CM | POA: Diagnosis not present

## 2020-04-19 DIAGNOSIS — N390 Urinary tract infection, site not specified: Secondary | ICD-10-CM | POA: Diagnosis not present

## 2020-04-19 DIAGNOSIS — N3090 Cystitis, unspecified without hematuria: Secondary | ICD-10-CM | POA: Diagnosis not present

## 2020-04-19 DIAGNOSIS — R739 Hyperglycemia, unspecified: Principal | ICD-10-CM

## 2020-04-19 MED ORDER — VICTOZA 3-PAK 0.6 MG/0.1 ML (18 MG/3 ML) SUBCUTANEOUS PEN INJECTOR
0 refills | 0 days | Status: CN
Start: 2020-04-19 — End: ?

## 2020-04-19 MED ORDER — EMPAGLIFLOZIN 10 MG TABLET
ORAL_TABLET | Freq: Every morning | ORAL | 0 refills | 30.00000 days | Status: CP
Start: 2020-04-19 — End: ?

## 2020-04-19 MED ORDER — METFORMIN 1,000 MG TABLET
ORAL_TABLET | Freq: Two times a day (BID) | ORAL | 0 refills | 90.00000 days | Status: CP
Start: 2020-04-19 — End: ?

## 2020-04-24 ENCOUNTER — Encounter: Payer: Self-pay | Admitting: Physical Therapy

## 2020-04-24 ENCOUNTER — Ambulatory Visit: Payer: 59 | Attending: Physical Medicine and Rehabilitation | Admitting: Physical Therapy

## 2020-04-24 ENCOUNTER — Other Ambulatory Visit: Payer: Self-pay

## 2020-04-24 VITALS — BP 145/90 | HR 102

## 2020-04-24 DIAGNOSIS — R262 Difficulty in walking, not elsewhere classified: Secondary | ICD-10-CM | POA: Insufficient documentation

## 2020-04-24 DIAGNOSIS — G8222 Paraplegia, incomplete: Secondary | ICD-10-CM | POA: Diagnosis present

## 2020-04-24 DIAGNOSIS — M6281 Muscle weakness (generalized): Secondary | ICD-10-CM | POA: Diagnosis present

## 2020-04-24 DIAGNOSIS — R2681 Unsteadiness on feet: Secondary | ICD-10-CM | POA: Diagnosis present

## 2020-04-24 NOTE — Therapy (Signed)
[facility] MAIN [facility] SERVICES [address] [place], [place], [address] Phone: [phone]   Fax:  [phone]  Physical Therapy Evaluation  Patient Details  Name: Ryan Bush MRN: [id number] Date of Birth: 1975/12/10 Referring Provider (PT): Felton Clinton   Encounter Date: 04/24/2020   PT End of Session - 04/24/20 0919    Visit Number 1    Number of Visits 17    Date for PT Re-Evaluation 06/19/20    PT Start Time 0913    PT Stop Time 1000    PT Time Calculation (min) 47 min    Equipment Utilized During Treatment Gait belt    Activity Tolerance Patient tolerated treatment well    Behavior During Therapy [facility] for tasks assessed/performed           Past Medical History:  Diagnosis Date  . HIV (human immunodeficiency virus infection) (HCC)     History reviewed. No pertinent surgical history.  Vitals:   04/24/20 0926  BP: (!) 145/90  Pulse: (!) 102  SpO2: 100%      Subjective Assessment - 04/24/20 1004    Subjective Patient states he is nervous about starting therapy today.    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 Patient had PT and OT in the hospital for 3 months. He was DC from PT due to his insurance running out and he needed a different kind of PT. He was discharged home and he had HHPT 2 x week for 1 month. He was dischaged from HHPT and was supposed to get out patient PT but his insurance was cancelled. He has a new insurance now and has [place].    Limitations Standing;Walking    How long can you sit comfortably? unlimited    How long can you stand comfortably? 1-3 mins with BUE support    How long can you walk comfortably? unable    Patient Stated Goals to walk sideways at his house    Currently in Pain? No/denies              [facility] PT Assessment - 04/24/20 0001      Assessment   Medical Diagnosis spinal  cord injury T 10     Referring Provider (PT) Felton Clinton    Prior Therapy yes      Precautions   Precautions None      Restrictions   Weight Bearing Restrictions No      Balance Screen   Has the patient fallen in the past 6 months No    Has the patient had a decrease in activity level because of a fear of falling?  Yes    Is the patient reluctant to leave their home because of a fear of falling?  No      Home Environment   Living Environment Private residence    Living Arrangements Spouse/significant other;Children    Available Help at Discharge [facility] - manual    Additional Comments sliding board      Prior Function   Level of Independence Independent    Vocation Unemployed    Leisure has 45 year old, 44 year old daughters      Cognition   Overall Cognitive Status Within Functional Limits for tasks assessed      Technical sales engineer Both upper extremities  Wheelchair Parts Management Independent    Distance unlimited            SUBJECTIVE Chief complaint: decreased functional mobility Onset: 01/2019 Recent changes in overall health/medication: No Prior history with therapy: Patient had good outcomes with home health therapy.  Follow-up appointment with MD: None scheduled Red flags (bowel/bladder changes, saddle paresthesia, personal history of cancer, chills/fever, night sweats, unrelenting pain) Negative   OBJECTIVE  MUSCULOSKELETAL: Tremor: Absent Bulk: Normal Tone: Normal, no clonus  Strength R/L 2-/0 Hip flexion 1/0 Hip external rotation 1/0 Hip internal rotation 1/1 Hip extension  1/0 Hip abduction 1/0 Hip adduction 2-/0 Knee extension 0/0 Knee flexion 1/0 Ankle Plantarflexion 1/0 Ankle Dorsiflexion   NEUROLOGICAL:  Mental Status Patient is oriented to person, place and time.  Recent memory is intact.  Remote memory is intact.  Attention span and  concentration are intact.  Expressive speech is intact.  Patient's fund of knowledge is within normal limits for educational level.   Sensation Patient has decreased gross sensation in BLE (L > R) in L4-S1 dermatome pattern.  Patient reports of increased numbness/tingling in right foot with PROM ankle DF/PF.    ASSESSMENT Clinical Impression: Patient is a pleasant 45 y.o. 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 He had HHPT for 1 month and now has new insurance and wants to continue his rehabilitation. He has poor RLE gross strength and trace LLE gross strength. He is independent with transfers with WC to all surfaces including his bed, car, and portable comode. He demonstrates independence with bed mobility and has difficulty with rolling from supine to prone requiring longer time to transition. He has 5/5 BUE strength and good static and dynamic sitting balance. He is able to stand in // bars for less 30 seconds with the support of BUE with a heavy lean posteriorly and non weight bearing through BLE. Patient will continue to benefit from skilled physical therapy to improve generalized strength, ROM, and capacity for functional activity.      PLAN Next Visit: Initiate BLE strengthening exercises HEP: Initiate HEP         Plan - 04/24/20 1010    Clinical Impression Statement Patient is a pleasant 45 y.o. 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 He had HHPT for 1 month and now has new insurance and wants to continue his rehabilitation. He has poor RLE gross strength and trace LLE gross strength. He is independent with transfers with WC to all surfaces including his bed, car, and portable comode. He demonstrates independence with bed mobility and has difficulty with rolling from supine to prone requiring longer time to transition. He has 5/5 BUE strength and good static and dynamic sitting balance. He is able  to stand in // bars for less 30 seconds with the support of BUE with a heavy lean posteriorly and non weight bearing through BLE. Patient will continue to benefit from skilled physical therapy to improve generalized strength, ROM, and capacity for functional activity.    Personal Factors and Comorbidities Comorbidity 3+;Time since onset of injury/illness/exacerbation    Comorbidities HIV, Urinary retention with bladder stretch injurt, CNS toxoplasmosis (CMS-HCC), Paraplegia, incomplete, diabetic    Examination-Activity Limitations Stand;Locomotion Level    Examination-Participation Restrictions Driving    Stability/Clinical Decision Making Evolving/Moderate complexity    Clinical Decision Making Moderate    Rehab Potential Fair    PT Frequency 2x / week    PT Duration  8 weeks    PT Treatment/Interventions ADLs/Self Care Home Management;Electrical Stimulation;Moist Heat;Gait training;Stair training;Functional mobility training;Therapeutic activities;Therapeutic exercise;Balance training;Neuromuscular re-education;Wheelchair mobility training;Manual techniques;Passive range of motion;Energy conservation    PT Next Visit Plan Initiate BLE strengthening    PT Home Exercise Plan HEP: glut squeezes, quad sets, bridges    Consulted and Agree with Plan of Care Patient           PT Short Term Goals - 04/24/20 1038      PT SHORT TERM GOAL #1   Title Patient will be independent in home exercise program to improve strength/mobility for better functional independence with ADLs.    Time 4    Period Weeks    Status New    Target Date 05/22/20      PT SHORT TERM GOAL #2   Title Patient will be able to stand in parallel bars with support of BUE for 30 seconds with adequate weight bearing through BLE.    Baseline 01/03: less than 30s    Time 4    Period Weeks    Status New    Target Date 05/22/20           PT Long Term Goals - 04/24/20 1042      PT LONG TERM GOAL #1   Title Patient will be  able to stand in parallel bars with support of BUE for 30-60 seconds with adequate weight bearing through BLE.    Baseline 01/03: less than 30s    Time 8    Period Weeks    Status New    Target Date 06/19/20      PT LONG TERM GOAL #2   Title Patient will increase BLE gross strength half a grade as to improve functional strength for increased standing tolerance and increased ADL ability.    Time 8    Period Weeks    Status New    Target Date 06/19/20      PT LONG TERM GOAL #3   Title Patient will reduce joint contracture in BLE ankle plantarflexion by being able to achieve ankle dorsiflexion to neutral for better functional mobility    Baseline 01/03: RLE patient actively moves it by 4 degrees, unable to actively move LLE into DF    Time 8    Period Weeks    Status New    Target Date 06/19/20            Patient will benefit from skilled therapeutic intervention in order to improve the following deficits and impairments:  Decreased range of motion,Decreased strength,Impaired flexibility,Difficulty walking,Decreased mobility,Decreased activity tolerance,Decreased endurance,Impaired sensation,Decreased balance,Impaired tone  Visit Diagnosis: Muscle weakness (generalized)  Difficulty in walking, not elsewhere classified  Paraplegia, incomplete ([place])  Unsteadiness on feet     Problem List There are no problems to display for this patient.  Karl Luke PT, DPT Onalee Hua Suszanne Conners 04/24/2020, 10:53 AM  [place] MAIN [facility] SERVICES [address] [place], [place], [address] Phone: [phone]   Fax:  [phone]  Name: Ryan Bush MRN: [id number] Date of Birth: 1975-04-29

## 2020-04-26 ENCOUNTER — Ambulatory Visit: Payer: 59

## 2020-05-01 ENCOUNTER — Ambulatory Visit: Payer: 59 | Admitting: Physical Therapy

## 2020-05-01 ENCOUNTER — Other Ambulatory Visit: Payer: Self-pay

## 2020-05-01 DIAGNOSIS — M6281 Muscle weakness (generalized): Secondary | ICD-10-CM | POA: Diagnosis not present

## 2020-05-01 DIAGNOSIS — R2681 Unsteadiness on feet: Secondary | ICD-10-CM

## 2020-05-01 DIAGNOSIS — R339 Retention of urine, unspecified: Secondary | ICD-10-CM | POA: Diagnosis not present

## 2020-05-01 DIAGNOSIS — G8222 Paraplegia, incomplete: Secondary | ICD-10-CM

## 2020-05-01 DIAGNOSIS — R262 Difficulty in walking, not elsewhere classified: Secondary | ICD-10-CM

## 2020-05-01 NOTE — Therapy (Signed)
[facility] MAIN [facility] SERVICES [address] [place], [place], [address] Phone: [phone]   Fax:  [phone]  Physical Therapy Treatment  Patient Details  Name: Ryan Bush MRN: [id number] Date of Birth: 06/17/1975 Referring Provider (PT): Felton Clinton   Encounter Date: 05/01/2020   PT End of Session - 05/01/20 1011    Visit Number 2    Number of Visits 17    Date for PT Re-Evaluation 06/19/20    PT Start Time 0903    PT Stop Time 1005    PT Time Calculation (min) 62 min    Equipment Utilized During Treatment Gait belt    Activity Tolerance Patient tolerated treatment well    Behavior During Therapy [facility] for tasks assessed/performed           Past Medical History:  Diagnosis Date  . HIV (human immunodeficiency virus infection) (HCC)     History reviewed. No pertinent surgical history.  There were no vitals filed for this visit.   Subjective Assessment - 05/01/20 0932    Subjective Patient denies of any falls or injuries since last therapy session. He reports he missed his last therapy appointment due to misunderstanding of the schedule. He states he is a little nervous about today's therapy sesion.    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 Patient had PT and OT in the hospital for 3 months. He was DC from PT due to his insurance running out and he needed a different kind of PT. He was discharged home and he had HHPT 2 x week for 1 month. He was dischaged from HHPT and was supposed to get out patient PT but his insurance was cancelled. He has a new insurance now and has [place].    Limitations Standing;Walking    How long can you sit comfortably? unlimited    How long can you stand comfortably? 1-3 mins with BUE support    How long can you walk comfortably? unable    Patient Stated Goals to walk sideways at his house     Currently in Pain? No/denies            There Ex: Supine quad/glut sets with 3s hold x 10; Supine SLR hip flexion 2 x 10 BLE, modA-maxA for complete ROM Supine alternating penguins (lateral crunches) for oblique activation x 10 bilateral;   Hooklying SAQ over bolster 2 x 10 BLE; minA for RLE, maxA for LLE Hooklying bridges with arms across chest 2 x 10; Hooklying alternating heel taps starting in 90/90 position with cues from therapist to keep low back on mat table 2 x 10;   Seated LAQ x 20 BLE;  Standing in // bars: BUE support requiring maxA to maintain standing position Patient leans on right elbow with standing in // bars and pushes up with arms for 30s; unable to weight bear through BLE    PT Short Term Goals - 04/24/20 1038      PT SHORT TERM GOAL #1   Title Patient will be independent in home exercise program to improve strength/mobility for better functional independence with ADLs.    Time 4    Period Weeks    Status New    Target Date 05/22/20      PT SHORT TERM GOAL #2   Title Patient will be able to stand in parallel bars with support of BUE for 30 seconds  with adequate weight bearing through BLE.    Baseline 01/03: less than 30s    Time 4    Period Weeks    Status New    Target Date 05/22/20             PT Long Term Goals - 04/24/20 1042      PT LONG TERM GOAL #1   Title Patient will be able to stand in parallel bars with support of BUE for 30-60 seconds with adequate weight bearing through BLE.    Baseline 01/03: less than 30s    Time 8    Period Weeks    Status New    Target Date 06/19/20      PT LONG TERM GOAL #2   Title Patient will increase BLE gross strength half a grade as to improve functional strength for increased standing tolerance and increased ADL ability.    Time 8    Period Weeks    Status New    Target Date 06/19/20      PT LONG TERM GOAL #3   Title Patient will reduce joint contracture in BLE ankle plantarflexion by being  able to achieve ankle dorsiflexion to neutral for better functional mobility    Baseline 01/03: RLE patient actively moves it by 4 degrees, unable to actively move LLE into DF    Time 8    Period Weeks    Status New    Target Date 06/19/20                 Plan - 05/01/20 1012    Clinical Impression Statement Patient completed strengthening exercises with minimal to no increase in pain with activity. He reports of difficulty with AROM exercises on RLE due to severe muscle weakness. Patient completed standing within // bars for 30 seconds x 2 attempts with heavy dependent on BUE. He demonstrates decreased weight bearing on BLE and maintains standing position on toes. Therapist provided manual blocking of bilateral knees to avoid knee flexion during standing. Patient would benefit from continued PT services to increase strength and mobility to improve patient's quality of life.    Personal Factors and Comorbidities Comorbidity 3+;Time since onset of injury/illness/exacerbation    Comorbidities HIV, Urinary retention with bladder stretch injurt, CNS toxoplasmosis (CMS-HCC), Paraplegia, incomplete, diabetic    Examination-Activity Limitations Stand;Locomotion Level    Examination-Participation Restrictions Driving    Stability/Clinical Decision Making Evolving/Moderate complexity    Rehab Potential Fair    PT Frequency 2x / week    PT Duration 8 weeks    PT Treatment/Interventions ADLs/Self Care Home Management;Electrical Stimulation;Moist Heat;Gait training;Stair training;Functional mobility training;Therapeutic activities;Therapeutic exercise;Balance training;Neuromuscular re-education;Wheelchair mobility training;Manual techniques;Passive range of motion;Energy conservation    PT Home Exercise Plan HEP: glut squeezes, quad sets, AAROM SLR, AAROM LAQ    Consulted and Agree with Plan of Care Patient           Patient will benefit from skilled therapeutic intervention in order to improve  the following deficits and impairments:  Decreased range of motion,Decreased strength,Impaired flexibility,Difficulty walking,Decreased mobility,Decreased activity tolerance,Decreased endurance,Impaired sensation,Decreased balance,Impaired tone  Visit Diagnosis: Muscle weakness (generalized)  Difficulty in walking, not elsewhere classified  Paraplegia, incomplete (HCC)  Unsteadiness on feet     Problem List There are no problems to display for this patient.  Jillyn Hidden PT, DPT Jerrye Beavers Eliezer Champagne 05/01/2020, 10:21 AM  [facility] MAIN [facility] SERVICES [address] [place], [place], [address] Phone: [phone]  Fax:  [phone]  Name: Ryan Bush MRN: [id number] Date of Birth: 25-Oct-1975

## 2020-05-03 ENCOUNTER — Ambulatory Visit: Payer: 59

## 2020-05-03 ENCOUNTER — Other Ambulatory Visit: Payer: Self-pay

## 2020-05-03 DIAGNOSIS — R2681 Unsteadiness on feet: Secondary | ICD-10-CM

## 2020-05-03 DIAGNOSIS — G8222 Paraplegia, incomplete: Secondary | ICD-10-CM

## 2020-05-03 DIAGNOSIS — M6281 Muscle weakness (generalized): Secondary | ICD-10-CM | POA: Diagnosis not present

## 2020-05-03 DIAGNOSIS — R262 Difficulty in walking, not elsewhere classified: Secondary | ICD-10-CM

## 2020-05-03 NOTE — Therapy (Signed)
[facility] MAIN [facility] SERVICES [address] [place], [place], [address] Phone: [phone]   Fax:  [phone]  Physical Therapy Treatment  Patient Details  Name: Ryan Bush MRN: [id number] Date of Birth: 07/08/1975 Referring Provider (PT): Felton Clinton   Encounter Date: 05/03/2020   PT End of Session - 05/03/20 0858    Visit Number 3    Number of Visits 17    Date for PT Re-Evaluation 06/19/20    PT Start Time 0900    PT Stop Time 0955    PT Time Calculation (min) 55 min    Equipment Utilized During Treatment Gait belt    Activity Tolerance Patient tolerated treatment well    Behavior During Therapy [facility] for tasks assessed/performed           Past Medical History:  Diagnosis Date  . HIV (human immunodeficiency virus infection) (HCC)     History reviewed. No pertinent surgical history.  There were no vitals filed for this visit.   Subjective Assessment - 05/03/20 0857    Subjective Patient denies of any falls or injuries since last therapy session. He reports he has been compliant with his LE strengthening HEP.    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 Patient had PT and OT in the hospital for 3 months. He was DC from PT due to his insurance running out and he needed a different kind of PT. He was discharged home and he had HHPT 2 x week for 1 month. He was dischaged from HHPT and was supposed to get out patient PT but his insurance was cancelled. He has a new insurance now and has [place].    Limitations Standing;Walking    How long can you sit comfortably? unlimited    How long can you stand comfortably? 1-3 mins with BUE support    How long can you walk comfortably? unable    Patient Stated Goals to walk sideways at his house    Currently in Pain? No/denies          Manual Therapy: Bilateral Hamstring, Single knee  to chest, Piriformis- each leg 20 sec hold x 3 sets. Patient denied any pain or difficulty with stretching.   There Ex: Supine quad/glut sets with 5s hold x 10- Verbal cues and tactile cues for correct technique.     Hooklying SAQ over bolster 2 x 10 BLE; minA for RLE, maxA for LLE Hooklying bridges with arms across chest 2 x 10; Hooklying alternating heel taps starting in 90/90 position with cues from therapist to keep low back on mat table 2 x 10; Hooklying Hip adduction (AROM) with physical assist on left (min) x 10 reps   Seated LAQ x 15  BLE;  Standing in // bars: BUE support requiring maxA to maintain standing position x 4 trials:  1)  67 sec- max assist of 1 with PT blocking knees and using gait belt- forward push to extend hips. Patient able to stand more erect with verbal cues.  2) 75 sec- max assist- Patient exhibiting mostly B UE support yet able to minimally extend hip upon verbal cues and required mod assist to maintain. 3) 110 sec- max assist 4) 45 sec- Patient experienced increased bilateral UE support and unable to extend right knee at the end of session so terminated standing due to overall LE fatigue.  Clinical impression: Patient demonstrated improved standing  endurance today versus previous sesssion with decreased overall UE support. Patient will continue to benefit from continued skilled PT services to further progress overall standing endurance and strengthening.                        PT Education - 05/03/20 1015    Education Details Verbal cues for standing balance; Discussed/Reviewed current HEP    Person(s) Educated Patient    Methods Explanation;Demonstration;Verbal cues    Comprehension Verbalized understanding;Returned demonstration;Verbal cues required;Need further instruction            PT Short Term Goals - 04/24/20 1038      PT SHORT TERM GOAL #1   Title Patient will be independent in home exercise program to improve  strength/mobility for better functional independence with ADLs.    Time 4    Period Weeks    Status New    Target Date 05/22/20      PT SHORT TERM GOAL #2   Title Patient will be able to stand in parallel bars with support of BUE for 30 seconds with adequate weight bearing through BLE.    Baseline 01/03: less than 30s    Time 4    Period Weeks    Status New    Target Date 05/22/20             PT Long Term Goals - 04/24/20 1042      PT LONG TERM GOAL #1   Title Patient will be able to stand in parallel bars with support of BUE for 30-60 seconds with adequate weight bearing through BLE.    Baseline 01/03: less than 30s    Time 8    Period Weeks    Status New    Target Date 06/19/20      PT LONG TERM GOAL #2   Title Patient will increase BLE gross strength half a grade as to improve functional strength for increased standing tolerance and increased ADL ability.    Time 8    Period Weeks    Status New    Target Date 06/19/20      PT LONG TERM GOAL #3   Title Patient will reduce joint contracture in BLE ankle plantarflexion by being able to achieve ankle dorsiflexion to neutral for better functional mobility    Baseline 01/03: RLE patient actively moves it by 4 degrees, unable to actively move LLE into DF    Time 8    Period Weeks    Status New    Target Date 06/19/20                 Plan - 05/03/20 1017    Clinical Impression Statement Patient demonstrated improved standing endurance today versus previous sesssion with decreased overall UE support.    Personal Factors and Comorbidities Comorbidity 3+;Time since onset of injury/illness/exacerbation    Comorbidities HIV, Urinary retention with bladder stretch injurt, CNS toxoplasmosis (CMS-HCC), Paraplegia, incomplete, diabetic    Examination-Activity Limitations Stand;Locomotion Level    Examination-Participation Restrictions Driving    Stability/Clinical Decision Making Evolving/Moderate complexity    Rehab  Potential Fair    PT Frequency 2x / week    PT Duration 8 weeks    PT Treatment/Interventions ADLs/Self Care Home Management;Electrical Stimulation;Moist Heat;Gait training;Stair training;Functional mobility training;Therapeutic activities;Therapeutic exercise;Balance training;Neuromuscular re-education;Wheelchair mobility training;Manual techniques;Passive range of motion;Energy conservation    PT Next Visit Plan Continue with progressive LE strengthening in supine/seated position and continue with Standing balance/endurance  in Parallel bars.    PT Home Exercise Plan HEP: glut squeezes, quad sets, AAROM SLR, AAROM LAQ    Consulted and Agree with Plan of Care Patient           Patient will benefit from skilled therapeutic intervention in order to improve the following deficits and impairments:  Decreased range of motion,Decreased strength,Impaired flexibility,Difficulty walking,Decreased mobility,Decreased activity tolerance,Decreased endurance,Impaired sensation,Decreased balance,Impaired tone  Visit Diagnosis: Muscle weakness (generalized)  Difficulty in walking, not elsewhere classified  Paraplegia, incomplete (HCC)  Unsteadiness on feet     Problem List There are no problems to display for this patient.   Lenda Kelp, PT 05/03/2020, 11:04 AM  [place] [facility] MAIN [facility] SERVICES [address] [place], [place], [address] Phone: [phone]   Fax:  [phone]  Name: Ryan Bush MRN: [id number] Date of Birth: 1975/08/19

## 2020-05-08 ENCOUNTER — Ambulatory Visit: Payer: 59

## 2020-05-10 ENCOUNTER — Ambulatory Visit: Payer: 59

## 2020-05-15 ENCOUNTER — Other Ambulatory Visit: Payer: Self-pay

## 2020-05-15 ENCOUNTER — Ambulatory Visit: Payer: 59 | Admitting: Physical Therapy

## 2020-05-15 DIAGNOSIS — G8222 Paraplegia, incomplete: Secondary | ICD-10-CM

## 2020-05-15 DIAGNOSIS — M6281 Muscle weakness (generalized): Secondary | ICD-10-CM | POA: Diagnosis not present

## 2020-05-15 DIAGNOSIS — R262 Difficulty in walking, not elsewhere classified: Secondary | ICD-10-CM

## 2020-05-15 DIAGNOSIS — R2681 Unsteadiness on feet: Secondary | ICD-10-CM

## 2020-05-15 NOTE — Therapy (Signed)
[place] [facility] MAIN [facility] SERVICES [address] [place], [place], [address] Phone: [phone]   Fax:  [phone]  Physical Therapy Treatment  Patient Details  Name: Ryan Bush MRN: [id number] Date of Birth: [birth year] Referring Provider (PT): Felton Clinton   Encounter Date: 05/15/2020   PT End of Session - 05/15/20 0856    Visit Number 4    Number of Visits 17    Date for PT Re-Evaluation 06/19/20    PT Start Time 0900    PT Stop Time 0952    PT Time Calculation (min) 52 min    Equipment Utilized During Treatment Gait belt    Activity Tolerance Patient tolerated treatment well    Behavior During Therapy [facility] for tasks assessed/performed           Past Medical History:  Diagnosis Date  . HIV (human immunodeficiency virus infection) (HCC)     History reviewed. No pertinent surgical history.  There were no vitals filed for this visit.    Manual Therapy: Bilateral Hamstring, Single knee to chest, Piriformis- each leg 20 sec hold x 3 sets. Patient denied any pain or difficulty with stretching.   There Ex: Supine quad/glut sets with 5s hold x 10- Verbal cues and tactile cues for correct technique.    Hooklying SAQ over bolster 2 x 10 BLE; minA for RLE with therapist providing quad tapping to activate quads, maxA for LLE Hooklying bridges with arms across chest 2 x 10; Hooklying alternating heel taps starting in 90/90 position with cues from therapist to keep low back on mat table 2 x 10; Hooklying Hip adduction (AROM) with physical assist on left (min) x 10 reps  Standing in // bars: BUE support requiring modA-maxA to maintain standing position x 4 trials:  1)  45 sec- modA-minA assist of 2 with PT blocking knees and using gait belt while other PT maintained trunk extension- forward push to extend hips. Patient able to stand more erect with verbal cues.  2) 30 sec- modA- Patient exhibiting mostly B UE support yet  able to minimally extend hip upon verbal cues and required mod assist to maintain.  3) 50 sec- Patient experienced increased bilateral UE support and unable to extend right knee at the end of session so terminated standing due to overall LE fatigue.    Patient completed stretching and strengthening exercises with fair tolerance to activity. He demonstrates overall more independence with completing sit to stands and maintaining standing position for prolong period of time. He continues to require therapist to block bilateral knees to avoid knee flexion while attempting to stand. Patient demonstrates decreased ankle DF bilaterally and difficulty maintaining vertical position due to deficits in proprioception cues. Patient will continue to benefit from skilled physical therapy to improve generalized strength, ROM, and capacity for functional activity.          PT Short Term Goals - 04/24/20 1038      PT SHORT TERM GOAL #1   Title Patient will be independent in home exercise program to improve strength/mobility for better functional independence with ADLs.    Time 4    Period Weeks    Status New    Target Date 05/22/20      PT SHORT TERM GOAL #2   Title Patient will be able to stand in parallel bars with support of BUE for 30 seconds with adequate weight bearing through BLE.    Baseline 01/03: less than 30s  Time 4    Period Weeks    Status New    Target Date 05/22/20             PT Long Term Goals - 04/24/20 1042      PT LONG TERM GOAL #1   Title Patient will be able to stand in parallel bars with support of BUE for 30-60 seconds with adequate weight bearing through BLE.    Baseline 01/03: less than 30s    Time 8    Period Weeks    Status New    Target Date 06/19/20      PT LONG TERM GOAL #2   Title Patient will increase BLE gross strength half a grade as to improve functional strength for increased standing tolerance and increased ADL ability.    Time 8    Period Weeks     Status New    Target Date 06/19/20      PT LONG TERM GOAL #3   Title Patient will reduce joint contracture in BLE ankle plantarflexion by being able to achieve ankle dorsiflexion to neutral for better functional mobility    Baseline 01/03: RLE patient actively moves it by 4 degrees, unable to actively move LLE into DF    Time 8    Period Weeks    Status New    Target Date 06/19/20                 Plan - 05/15/20 1009    Clinical Impression Statement Patient completed stretching and strengthening exercises with fair tolerance to activity. He demonstrates overall more independence with completing sit to stands and maintaining standing position for prolong period of time. He continues to require therapist to block bilateral knees to avoid knee flexion while attempting to stand. Patient demonstrates decreased ankle DF bilaterally and difficulty maintaining vertical position due to deficits in proprioception cues. Patient will continue to benefit from skilled physical therapy to improve generalized strength, ROM, and capacity for functional activity.    Personal Factors and Comorbidities Comorbidity 3+;Time since onset of injury/illness/exacerbation    Comorbidities HIV, Urinary retention with bladder stretch injurt, CNS toxoplasmosis (CMS-HCC), Paraplegia, incomplete, diabetic    Examination-Activity Limitations Stand;Locomotion Level    Examination-Participation Restrictions Driving    Stability/Clinical Decision Making Evolving/Moderate complexity    Rehab Potential Fair    PT Frequency 2x / week    PT Duration 8 weeks    PT Treatment/Interventions ADLs/Self Care Home Management;Electrical Stimulation;Moist Heat;Gait training;Stair training;Functional mobility training;Therapeutic activities;Therapeutic exercise;Balance training;Neuromuscular re-education;Wheelchair mobility training;Manual techniques;Passive range of motion;Energy conservation    PT Next Visit Plan Continue with  progressive LE strengthening in supine/seated position and continue with Standing balance/endurance in Parallel bars.    PT Home Exercise Plan Added heel cord stretching    Consulted and Agree with Plan of Care Patient           Patient will benefit from skilled therapeutic intervention in order to improve the following deficits and impairments:  Decreased range of motion,Decreased strength,Impaired flexibility,Difficulty walking,Decreased mobility,Decreased activity tolerance,Decreased endurance,Impaired sensation,Decreased balance,Impaired tone  Visit Diagnosis: Muscle weakness (generalized)  Difficulty in walking, not elsewhere classified  Paraplegia, incomplete (HCC)  Unsteadiness on feet     Problem List There are no problems to display for this patient.  Jillyn Hidden PT, DPT Jerrye Beavers Eliezer Champagne 05/15/2020, 10:46 AM  [place] [facility] MAIN [facility] SERVICES [address] [place], [place], [address] Phone: [phone]   Fax:  [phone]  Name: Ryan Bush MRN: [id number] Date of Birth: 09-14-1975

## 2020-05-17 ENCOUNTER — Ambulatory Visit: Payer: 59 | Admitting: Physical Therapy

## 2020-05-17 ENCOUNTER — Other Ambulatory Visit: Payer: Self-pay

## 2020-05-17 DIAGNOSIS — M6281 Muscle weakness (generalized): Secondary | ICD-10-CM

## 2020-05-17 DIAGNOSIS — R262 Difficulty in walking, not elsewhere classified: Secondary | ICD-10-CM

## 2020-05-17 DIAGNOSIS — G8222 Paraplegia, incomplete: Secondary | ICD-10-CM

## 2020-05-17 DIAGNOSIS — R2681 Unsteadiness on feet: Secondary | ICD-10-CM

## 2020-05-17 NOTE — Therapy (Signed)
[facility] MAIN [facility] SERVICES [address] [place], [place], [address] Phone: [phone]   Fax:  [phone]  Physical Therapy Treatment  Patient Details  Name: Ryan Bush MRN: [id number] Date of Birth: Jul 14, 1975 Referring Provider (PT): Felton Clinton   Encounter Date: 05/17/2020   PT End of Session - 05/17/20 0926    Visit Number 5    Number of Visits 17    Date for PT Re-Evaluation 06/19/20    PT Start Time 0900    PT Stop Time 0950    PT Time Calculation (min) 50 min    Equipment Utilized During Treatment Gait belt    Activity Tolerance Patient tolerated treatment well    Behavior During Therapy [facility] for tasks assessed/performed           Past Medical History:  Diagnosis Date  . HIV (human immunodeficiency virus infection) (HCC)     History reviewed. No pertinent surgical history.  There were no vitals filed for this visit.   Subjective Assessment - 05/17/20 0925    Subjective No new symptoms or injuries noted at this therapy session. Patient states he feels more alive when he participates in therapy.    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 Patient had PT and OT in the hospital for 3 months. He was DC from PT due to his insurance running out and he needed a different kind of PT. He was discharged home and he had HHPT 2 x week for 1 month. He was dischaged from HHPT and was supposed to get out patient PT but his insurance was cancelled. He has a new insurance now and has [place].    Limitations Standing;Walking    How long can you sit comfortably? unlimited    How long can you stand comfortably? 1-3 mins with BUE support    How long can you walk comfortably? unable    Patient Stated Goals to walk sideways at his house    Currently in Pain? No/denies           Manual Therapy: Bilateral hamstring stretch x 3  30s holds Bilateral quad stretch x 3 30s holds Bilateral heel cord stretch x 3 30s holds Bilateral hip abduction stretch x 3 30s holds Single knee to chest stretch x 3 30s holds    There Ex: Supine quad/glut sets with 5s hold x 10- Verbal cues and tactile cues for correct technique.    Hooklying SAQ over bolster 2 x 10 BLE; minA for RLE with therapist providing quad tapping to activate quads, maxA for LLE Hooklying bridges with arms across chest 2 x 10; Hooklying alternating heel taps starting in 90/90 position with cues from therapist to keep low back on mat table 2 x 10; Hooklying Hip adduction (AROM) with physical assist on left (min) x 10 reps  Standing in // bars: BUE support requiring modA-maxA to maintain standing position x 4 trials:  1)  90 sec- modA-maxA assist of 2 with PT blocking knees and using gait belt while other PT maintained trunk extension- forward push to extend hips. Patient able to stand more erect with verbal cues.  2) 45 sec- modA- Patient exhibiting mostly B UE support yet able to minimally extend hip upon verbal cues and required mod assist to maintain.  3) 50 sec- Patient experienced increased bilateral UE support and unable to extend right knee at the end  of session so terminated standing due to overall LE fatigue.  Clinical Impression: Patient completed strengthening exercises with fair tolerance to activity. Patient demonstrated increased muscle fatigue with exercise today due to decreased core strength and trunk stabilization. Patient demonstrates increased standing time requiring more assistance by therapist to maintain standing position. He demonstrates increased trunk extension when he extends his hips while he stands with a heavy posterior lean. Therapist continues to block bilateral knees to maintain proper positioning of knees and ankles with standing. Therapist provides an inferior force on bilateral knee joint for 30s prior to standing when patient is  sitting at edge of his chair to maintain heels on the ground. Patient will continue to benefit from skilled physical therapy to improve generalized strength, ROM, and capacity for functional activity.           PT Short Term Goals - 04/24/20 1038      PT SHORT TERM GOAL #1   Title Patient will be independent in home exercise program to improve strength/mobility for better functional independence with ADLs.    Time 4    Period Weeks    Status New    Target Date 05/22/20      PT SHORT TERM GOAL #2   Title Patient will be able to stand in parallel bars with support of BUE for 30 seconds with adequate weight bearing through BLE.    Baseline 01/03: less than 30s    Time 4    Period Weeks    Status New    Target Date 05/22/20             PT Long Term Goals - 04/24/20 1042      PT LONG TERM GOAL #1   Title Patient will be able to stand in parallel bars with support of BUE for 30-60 seconds with adequate weight bearing through BLE.    Baseline 01/03: less than 30s    Time 8    Period Weeks    Status New    Target Date 06/19/20      PT LONG TERM GOAL #2   Title Patient will increase BLE gross strength half a grade as to improve functional strength for increased standing tolerance and increased ADL ability.    Time 8    Period Weeks    Status New    Target Date 06/19/20      PT LONG TERM GOAL #3   Title Patient will reduce joint contracture in BLE ankle plantarflexion by being able to achieve ankle dorsiflexion to neutral for better functional mobility    Baseline 01/03: RLE patient actively moves it by 4 degrees, unable to actively move LLE into DF    Time 8    Period Weeks    Status New    Target Date 06/19/20                 Plan - 05/17/20 1135    Clinical Impression Statement Patient completed strengthening exercises with fair tolerance to activity. Patient demonstrated increased muscle fatigue with exercise today due to decreased core strength and trunk  stabilization. Patient demonstrates increased standing time requiring more assistance by therapist to maintain standing position. He demonstrates increased trunk extension when he extends his hips while he stands with a heavy posterior lean. Therapist continues to block bilateral knees to maintain proper positioning of knees and ankles with standing. Therapist provides an inferior force on bilateral knee joint for 30s prior to standing when patient is sitting  at edge of his chair to maintain heels on the ground. Patient will continue to benefit from skilled physical therapy to improve generalized strength, ROM, and capacity for functional activity.    Personal Factors and Comorbidities Comorbidity 3+;Time since onset of injury/illness/exacerbation    Comorbidities HIV, Urinary retention with bladder stretch injurt, CNS toxoplasmosis (CMS-HCC), Paraplegia, incomplete, diabetic    Examination-Activity Limitations Stand;Locomotion Level    Examination-Participation Restrictions Driving    Stability/Clinical Decision Making Evolving/Moderate complexity    Rehab Potential Fair    PT Frequency 2x / week    PT Duration 8 weeks    PT Treatment/Interventions ADLs/Self Care Home Management;Electrical Stimulation;Moist Heat;Gait training;Stair training;Functional mobility training;Therapeutic activities;Therapeutic exercise;Balance training;Neuromuscular re-education;Wheelchair mobility training;Manual techniques;Passive range of motion;Energy conservation    PT Next Visit Plan Continue with progressive LE strengthening in supine/seated position and continue with Standing balance/endurance in Parallel bars.    PT Home Exercise Plan Added heel cord stretching    Consulted and Agree with Plan of Care Patient           Patient will benefit from skilled therapeutic intervention in order to improve the following deficits and impairments:  Decreased range of motion,Decreased strength,Impaired flexibility,Difficulty  walking,Decreased mobility,Decreased activity tolerance,Decreased endurance,Impaired sensation,Decreased balance,Impaired tone  Visit Diagnosis: Muscle weakness (generalized)  Difficulty in walking, not elsewhere classified  Paraplegia, incomplete (HCC)  Unsteadiness on feet     Problem List There are no problems to display for this patient.  Jillyn Hidden PT, DPT Jerrye Beavers Eliezer Champagne 05/17/2020, 11:41 AM  [place] MAIN [facility] SERVICES [address] [place], [place], [address] Phone: [phone]   Fax:  [phone]  Name: Benn Tarver MRN: [id number] Date of Birth: 1976/03/03

## 2020-05-22 ENCOUNTER — Ambulatory Visit: Payer: 59

## 2020-05-22 DIAGNOSIS — E785 Hyperlipidemia, unspecified: Secondary | ICD-10-CM | POA: Diagnosis not present

## 2020-05-22 DIAGNOSIS — Z9189 Other specified personal risk factors, not elsewhere classified: Secondary | ICD-10-CM | POA: Diagnosis not present

## 2020-05-22 DIAGNOSIS — E119 Type 2 diabetes mellitus without complications: Secondary | ICD-10-CM | POA: Diagnosis not present

## 2020-05-22 DIAGNOSIS — Z1329 Encounter for screening for other suspected endocrine disorder: Secondary | ICD-10-CM | POA: Diagnosis not present

## 2020-05-22 DIAGNOSIS — L24A2 Irritant contact dermatitis due to fecal, urinary or dual incontinence: Secondary | ICD-10-CM | POA: Diagnosis not present

## 2020-05-24 ENCOUNTER — Ambulatory Visit: Payer: 59 | Attending: Physical Medicine and Rehabilitation | Admitting: Physical Therapy

## 2020-05-24 ENCOUNTER — Other Ambulatory Visit: Payer: Self-pay

## 2020-05-24 DIAGNOSIS — M6281 Muscle weakness (generalized): Secondary | ICD-10-CM | POA: Insufficient documentation

## 2020-05-24 DIAGNOSIS — R262 Difficulty in walking, not elsewhere classified: Secondary | ICD-10-CM | POA: Diagnosis not present

## 2020-05-24 DIAGNOSIS — R2681 Unsteadiness on feet: Secondary | ICD-10-CM | POA: Diagnosis not present

## 2020-05-24 DIAGNOSIS — G8222 Paraplegia, incomplete: Secondary | ICD-10-CM

## 2020-05-24 NOTE — Therapy (Signed)
[facility] MAIN [facility] SERVICES [address] [place], [place], [address] Phone: [phone]   Fax:  [phone]  Physical Therapy Treatment  Patient Details  Name: Ryan Bush MRN: [id number] Date of Birth: 07-Jul-1975 Referring Provider (PT): Felton Clinton   Encounter Date: 05/24/2020   PT End of Session - 05/24/20 1154    Visit Number 6    Number of Visits 17    Date for PT Re-Evaluation 06/19/20    Authorization Type eval: 01/03    PT Start Time 0900    PT Stop Time 0950    PT Time Calculation (min) 50 min    Equipment Utilized During Treatment Gait belt    Activity Tolerance Patient tolerated treatment well    Behavior During Therapy [facility] for tasks assessed/performed           Past Medical History:  Diagnosis Date  . HIV (human immunodeficiency virus infection) (HCC)     History reviewed. No pertinent surgical history.  There were no vitals filed for this visit.   Subjective Assessment - 05/24/20 0926    Subjective Patient reports he is compliant with his HEP. He denies of any injuries or falls since last therpay session.    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 Patient had PT and OT in the hospital for 3 months. He was DC from PT due to his insurance running out and he needed a different kind of PT. He was discharged home and he had HHPT 2 x week for 1 month. He was dischaged from HHPT and was supposed to get out patient PT but his insurance was cancelled. He has a new insurance now and has [place].    Limitations Standing;Walking    How long can you sit comfortably? unlimited    How long can you stand comfortably? 1-3 mins with BUE support    How long can you walk comfortably? unable    Patient Stated Goals to walk sideways at his house    Currently in Pain? No/denies            Manual Therapy: Bilateral  hamstring stretch x 3 30s holds Bilateral quad stretch x 3 30s holds Bilateral heel cord stretch x 3 30s holds Bilateral hip abduction stretch x 3 30s holds Single knee to chest stretch x 3 30s holds Lower trunk rotation stretch x 3 30s holds     There Ex: Supine quad/glut sets with 5s hold x 10- Verbal cues and tactile cues for correct technique.    Hooklying SAQ over bolster 2 x 10 BLE; RLE with therapist providing quad tapping to activate quads, maxA for LLE Hooklying bridges with arms across chest 2 x 10; Hooklying alternating heel taps starting in 90/90 position with cues from therapist to keep low back on mat table 2 x 10;  Standing in // bars: BUE support requiring minA-maxA to maintain standing position x 3 trials:  1) 85 sec- minA-modA assist of 2 with PT blocking knees and using gait belt while other PT maintained trunk extension- forward push to extend hips. Patient able to stand more erect with verbal cues.  2) 50 sec- modA- Patient exhibiting mostly B UE support yet able to minimally extend hip upon verbal cues and required mod assist to maintain. Added weight shifts to improve weight bearing on BLE requiring minA-modA for safety and decrease posterior lean.   3)  40 sec- Patient experienced increased bilateral UE support and unable to extend right knee at the end of session so terminated standing due to overall LE fatigue.         PT Education - 05/24/20 1153    Education Details Educated patient on importance of orthotic    Person(s) Educated Patient    Methods Explanation;Verbal cues    Comprehension Verbalized understanding            PT Short Term Goals - 04/24/20 1038      PT SHORT TERM GOAL #1   Title Patient will be independent in home exercise program to improve strength/mobility for better functional independence with ADLs.    Time 4    Period Weeks    Status New    Target Date 05/22/20      PT SHORT TERM GOAL #2   Title Patient will be able to  stand in parallel bars with support of BUE for 30 seconds with adequate weight bearing through BLE.    Baseline 01/03: less than 30s    Time 4    Period Weeks    Status New    Target Date 05/22/20             PT Long Term Goals - 04/24/20 1042      PT LONG TERM GOAL #1   Title Patient will be able to stand in parallel bars with support of BUE for 30-60 seconds with adequate weight bearing through BLE.    Baseline 01/03: less than 30s    Time 8    Period Weeks    Status New    Target Date 06/19/20      PT LONG TERM GOAL #2   Title Patient will increase BLE gross strength half a grade as to improve functional strength for increased standing tolerance and increased ADL ability.    Time 8    Period Weeks    Status New    Target Date 06/19/20      PT LONG TERM GOAL #3   Title Patient will reduce joint contracture in BLE ankle plantarflexion by being able to achieve ankle dorsiflexion to neutral for better functional mobility    Baseline 01/03: RLE patient actively moves it by 4 degrees, unable to actively move LLE into DF    Time 8    Period Weeks    Status New    Target Date 06/19/20                 Plan - 05/24/20 1224    Clinical Impression Statement Patient able to complete standing for greater than 60 seconds at a time requiring minA-modA. He continues to demonstrates knee instability and increased trunk extension when maintaining standing position with medium-heavy support on BUE. Therapist provided patient education on possible bilateral KAFO to faciliate knee extension and ankle DF with weight bearing positions with patient compliant with suggestion. Patient demonstrated weight shifts today with decreased weight shift on RLE due to incresaed ankle PF upon standing.    Personal Factors and Comorbidities Comorbidity 3+;Time since onset of injury/illness/exacerbation    Comorbidities HIV, Urinary retention with bladder stretch injurt, CNS toxoplasmosis (CMS-HCC),  Paraplegia, incomplete, diabetic    Examination-Activity Limitations Stand;Locomotion Level    Examination-Participation Restrictions Driving    Stability/Clinical Decision Making Evolving/Moderate complexity    Rehab Potential Fair    PT Frequency 2x / week    PT Duration 8 weeks    PT Treatment/Interventions ADLs/Self Care  Home Management;Electrical Stimulation;Moist Heat;Gait training;Stair training;Functional mobility training;Therapeutic activities;Therapeutic exercise;Balance training;Neuromuscular re-education;Wheelchair mobility training;Manual techniques;Passive range of motion;Energy conservation    PT Next Visit Plan Continue with progressive LE strengthening in supine/seated position and continue with Standing balance/endurance in Parallel bars.    PT Home Exercise Plan Added heel cord stretching    Consulted and Agree with Plan of Care Patient           Patient will benefit from skilled therapeutic intervention in order to improve the following deficits and impairments:  Decreased range of motion,Decreased strength,Impaired flexibility,Difficulty walking,Decreased mobility,Decreased activity tolerance,Decreased endurance,Impaired sensation,Decreased balance,Impaired tone  Visit Diagnosis: Muscle weakness (generalized)  Difficulty in walking, not elsewhere classified  Unsteadiness on feet  Paraplegia, incomplete (HCC)     Problem List There are no problems to display for this patient.  Jillyn Hidden PT, DPT Jerrye Beavers Eliezer Champagne 05/24/2020, 12:34 PM  [place] [facility] MAIN [facility] SERVICES [address] [place], [place], [address] Phone: [phone]   Fax:  [phone]  Name: Ryan Bush MRN: [id number] Date of Birth: 1975-11-02

## 2020-05-29 ENCOUNTER — Ambulatory Visit: Payer: 59

## 2020-05-29 ENCOUNTER — Other Ambulatory Visit: Payer: Self-pay

## 2020-05-29 DIAGNOSIS — G8222 Paraplegia, incomplete: Secondary | ICD-10-CM | POA: Diagnosis not present

## 2020-05-29 DIAGNOSIS — R2681 Unsteadiness on feet: Secondary | ICD-10-CM

## 2020-05-29 DIAGNOSIS — M6281 Muscle weakness (generalized): Secondary | ICD-10-CM | POA: Diagnosis not present

## 2020-05-29 DIAGNOSIS — R262 Difficulty in walking, not elsewhere classified: Secondary | ICD-10-CM | POA: Diagnosis not present

## 2020-05-29 DIAGNOSIS — R339 Retention of urine, unspecified: Secondary | ICD-10-CM | POA: Diagnosis not present

## 2020-05-29 NOTE — Therapy (Signed)
[place] [facility] MAIN [facility] SERVICES [address] [place], [place], [address] Phone: [phone]   Fax:  [phone]  Physical Therapy Treatment  Patient Details  Name: Ryan Bush MRN: [id number] Date of Birth: 12-24-1975 Referring Provider (PT): Felton Clinton   Encounter Date: 05/29/2020   PT End of Session - 05/29/20 0959    Visit Number 7    Number of Visits 17    Date for PT Re-Evaluation 06/19/20    Authorization Type eval: 01/03    PT Start Time 0904    PT Stop Time 0955    PT Time Calculation (min) 51 min    Equipment Utilized During Treatment Gait belt    Activity Tolerance Patient tolerated treatment well;Patient limited by fatigue    Behavior During Therapy [facility] for tasks assessed/performed           Past Medical History:  Diagnosis Date  . HIV (human immunodeficiency virus infection) (HCC)     History reviewed. No pertinent surgical history.  There were no vitals filed for this visit.   Subjective Assessment - 05/29/20 0959    Subjective The patient reports he is doing ok today.    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 Patient had PT and OT in the hospital for 3 months. He was DC from PT due to his insurance running out and he needed a different kind of PT. He was discharged home and he had HHPT 2 x week for 1 month. He was dischaged from HHPT and was supposed to get out patient PT but his insurance was cancelled. He has a new insurance now and has [place].    Limitations Standing;Walking    How long can you sit comfortably? unlimited    How long can you stand comfortably? 1-3 mins with BUE support    How long can you walk comfortably? unable    Patient Stated Goals to walk sideways at his house    Currently in Pain? No/denies          Manual Therapy: Bilateral hamstring stretch x 3 30s holds Bilateral quad  stretch/hip flexor with leg over edge of table  x 3 30s holds Bilateral heel cord stretch x 3 30s holds Bilateral hip abduction stretch x 3 30s holds Single knee to chest stretch x 3 30s holds    There Ex: Supine quad/glut sets with 5s hold x 10- Verbal cues and tactile cues for correct technique.      Hooklying SAQ over bolster 2 x 10 BLE; RLE with therapist providing quad tapping to activate quads, maxA for LLE Hooklying bridges with arms across chest 2 x 10; Hooklying alternating heel taps starting in 90/90 position with cues from therapist to keep low back on mat table 2 x 10, maxA for LLE   Standing in // bars: BUE support requiring minA-maxA to maintain standing position x 3 trials:- knee blocking each repetition   1) 60 sec- minAwith PT blocking knees and using gait belt.   Patient able to stand more erect with verbal cues.  2) 70 sec- modA- Patient exhibiting mostly B UE support yet able to minimally extend hip upon verbal cues and required mod assist to maintain.    3) 90  sec- Patient experienced increased bilateral UE support  PT Short Term Goals - 04/24/20 1038      PT SHORT TERM GOAL #1   Title Patient will be independent in home exercise program to improve strength/mobility for better functional independence with ADLs.    Time 4    Period Weeks    Status New    Target Date 05/22/20      PT SHORT TERM GOAL #2   Title Patient will be able to stand in parallel bars with support of BUE for 30 seconds with adequate weight bearing through BLE.    Baseline 01/03: less than 30s    Time 4    Period Weeks    Status New    Target Date 05/22/20             PT Long Term Goals - 04/24/20 1042      PT LONG TERM GOAL #1   Title Patient will be able to stand in parallel bars with support of BUE for 30-60 seconds with adequate weight bearing through BLE.    Baseline 01/03: less than 30s    Time 8    Period Weeks    Status  New    Target Date 06/19/20      PT LONG TERM GOAL #2   Title Patient will increase BLE gross strength half a grade as to improve functional strength for increased standing tolerance and increased ADL ability.    Time 8    Period Weeks    Status New    Target Date 06/19/20      PT LONG TERM GOAL #3   Title Patient will reduce joint contracture in BLE ankle plantarflexion by being able to achieve ankle dorsiflexion to neutral for better functional mobility    Baseline 01/03: RLE patient actively moves it by 4 degrees, unable to actively move LLE into DF    Time 8    Period Weeks    Status New    Target Date 06/19/20                 Plan - 05/29/20 1001    Clinical Impression Statement The patient requires max A for left LE exercises.  The patient was able to stand longer duration of time with less need for PT assist x2 and decreased posterior lean.  The patient required min A with knee blocking to avoid buckling for standing.  Each standing trial the patient required increased CGA as well as UE support.  Patient continues to benefit from skilled PT services to improve core and LE strength as well as to progress WB abilities.    Personal Factors and Comorbidities Comorbidity 3+;Time since onset of injury/illness/exacerbation    Comorbidities HIV, Urinary retention with bladder stretch injurt, CNS toxoplasmosis (CMS-HCC), Paraplegia, incomplete, diabetic    Examination-Activity Limitations Stand;Locomotion Level    Examination-Participation Restrictions Driving    Stability/Clinical Decision Making Evolving/Moderate complexity    Rehab Potential Fair    PT Frequency 2x / week    PT Duration 8 weeks    PT Treatment/Interventions ADLs/Self Care Home Management;Electrical Stimulation;Moist Heat;Gait training;Stair training;Functional mobility training;Therapeutic activities;Therapeutic exercise;Balance training;Neuromuscular re-education;Wheelchair mobility training;Manual  techniques;Passive range of motion;Energy conservation    PT Next Visit Plan Continue with progressive LE strengthening in supine/seated position and continue with Standing balance/endurance in Parallel bars.    PT Home Exercise Plan Added heel cord stretching    Consulted and Agree with Plan of Care Patient           Patient  will benefit from skilled therapeutic intervention in order to improve the following deficits and impairments:  Decreased range of motion,Decreased strength,Impaired flexibility,Difficulty walking,Decreased mobility,Decreased activity tolerance,Decreased endurance,Impaired sensation,Decreased balance,Impaired tone  Visit Diagnosis: Muscle weakness (generalized)  Difficulty in walking, not elsewhere classified  Unsteadiness on feet  Paraplegia, incomplete (HCC)     Problem List There are no problems to display for this patient.   Colin Mulders PT, DPT 05/29/2020, 10:07 AM  [place] [facility] MAIN [facility] SERVICES [address] [place], [place], [address] Phone: [phone]   Fax:  [phone]  Name: Naheem Mosco MRN: [id number] Date of Birth: April 24, 1975

## 2020-05-31 ENCOUNTER — Ambulatory Visit: Payer: 59

## 2020-06-05 ENCOUNTER — Other Ambulatory Visit: Payer: Self-pay

## 2020-06-05 ENCOUNTER — Ambulatory Visit: Payer: 59

## 2020-06-05 DIAGNOSIS — R2681 Unsteadiness on feet: Secondary | ICD-10-CM | POA: Diagnosis not present

## 2020-06-05 DIAGNOSIS — M6281 Muscle weakness (generalized): Secondary | ICD-10-CM

## 2020-06-05 DIAGNOSIS — G8222 Paraplegia, incomplete: Secondary | ICD-10-CM | POA: Diagnosis not present

## 2020-06-05 DIAGNOSIS — R262 Difficulty in walking, not elsewhere classified: Secondary | ICD-10-CM | POA: Diagnosis not present

## 2020-06-05 NOTE — Therapy (Signed)
[place] [facility] MAIN [facility] SERVICES [address] [place], [place], [address] Phone: [phone]   Fax:  [phone]  Physical Therapy Treatment  Patient Details  Name: Ryan Bush MRN: [id number] Date of Birth: 1976/01/01 Referring Provider (PT): Felton Clinton   Encounter Date: 06/05/2020   PT End of Session - 06/05/20 0906    Visit Number 8    Number of Visits 17    Date for PT Re-Evaluation 06/19/20    Authorization Type eval: 01/03    PT Start Time 0906    PT Stop Time 0955    PT Time Calculation (min) 49 min    Equipment Utilized During Treatment Gait belt    Activity Tolerance Patient tolerated treatment well;Patient limited by fatigue    Behavior During Therapy [facility] for tasks assessed/performed           Past Medical History:  Diagnosis Date  . HIV (human immunodeficiency virus infection) (HCC)     History reviewed. No pertinent surgical history.  There were no vitals filed for this visit.   Subjective Assessment - 06/05/20 1005    Subjective The patient states some of the home exercises are getting a little easier but overall does feel about the same.    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 Patient had PT and OT in the hospital for 3 months. He was DC from PT due to his insurance running out and he needed a different kind of PT. He was discharged home and he had HHPT 2 x week for 1 month. He was dischaged from HHPT and was supposed to get out patient PT but his insurance was cancelled. He has a new insurance now and has [place].    Limitations Standing;Walking    How long can you sit comfortably? unlimited    How long can you stand comfortably? 1-3 mins with BUE support    How long can you walk comfortably? unable    Patient Stated Goals to walk sideways at his house    Currently in Pain? No/denies             Manual Therapy: Bilateral hamstring stretch x 3 30s holds Bilateral quad stretch/hip flexor with leg over edge of table  x 3 30s holds Bilateral heel cord stretch x 3 30s holds Bilateral hip abduction stretch x 3 30s holds Single knee to chest stretch x 3 30s holds    There Ex: Supine quad/glut sets with 5s hold x 10- Verbal cues and tactile cues for correct technique.      Hooklying SAQ over bolster 2 x 10 BLE max assist on left, min assist on right for greater rom Hooklying bridges with arms across chest 2 x 10- small glute lift off from mat  Seated hip adduction with ball 2"x20 Seated hip clamshell with yellow TB 2"x10 increased fatigued (issued for HEP) Seated foot presses into yellow dynadisc 2"x10 each   Standing in // bars: BUE support requiring minA-maxA to maintain standing position x 2 trials:- knee blocking each repetition   2 mins each rep with need to adjust right foot placement on second repetition.  Knee blocking for PT, min A for posture.  Patient with heavy UE use.                         PT Education - 06/05/20 1006  Education Details HEP    Person(s) Educated Patient    Methods Explanation;Demonstration    Comprehension Verbalized understanding            PT Short Term Goals - 04/24/20 1038      PT SHORT TERM GOAL #1   Title Patient will be independent in home exercise program to improve strength/mobility for better functional independence with ADLs.    Time 4    Period Weeks    Status New    Target Date 05/22/20      PT SHORT TERM GOAL #2   Title Patient will be able to stand in parallel bars with support of BUE for 30 seconds with adequate weight bearing through BLE.    Baseline 01/03: less than 30s    Time 4    Period Weeks    Status New    Target Date 05/22/20             PT Long Term Goals - 04/24/20 1042      PT LONG TERM GOAL #1   Title Patient will be able to stand in parallel bars with support of BUE  for 30-60 seconds with adequate weight bearing through BLE.    Baseline 01/03: less than 30s    Time 8    Period Weeks    Status New    Target Date 06/19/20      PT LONG TERM GOAL #2   Title Patient will increase BLE gross strength half a grade as to improve functional strength for increased standing tolerance and increased ADL ability.    Time 8    Period Weeks    Status New    Target Date 06/19/20      PT LONG TERM GOAL #3   Title Patient will reduce joint contracture in BLE ankle plantarflexion by being able to achieve ankle dorsiflexion to neutral for better functional mobility    Baseline 01/03: RLE patient actively moves it by 4 degrees, unable to actively move LLE into DF    Time 8    Period Weeks    Status New    Target Date 06/19/20                 Plan - 06/05/20 1007    Clinical Impression Statement Patient with less assistance needed for standing wtih ability to stand longer periods of time.  The patient does require heavy UE support to stand and support at knees to prevent flexion.  Patient tolerated additional strengthening exercises well with muscle fatigue.  The patient continues to benefit from additional skilled PT services to improve LE strength for improved quality of life.    Personal Factors and Comorbidities Comorbidity 3+;Time since onset of injury/illness/exacerbation    Comorbidities HIV, Urinary retention with bladder stretch injurt, CNS toxoplasmosis (CMS-HCC), Paraplegia, incomplete, diabetic    Examination-Activity Limitations Stand;Locomotion Level    Examination-Participation Restrictions Driving    Stability/Clinical Decision Making Evolving/Moderate complexity    Rehab Potential Fair    PT Frequency 2x / week    PT Duration 8 weeks    PT Treatment/Interventions ADLs/Self Care Home Management;Electrical Stimulation;Moist Heat;Gait training;Stair training;Functional mobility training;Therapeutic activities;Therapeutic exercise;Balance  training;Neuromuscular re-education;Wheelchair mobility training;Manual techniques;Passive range of motion;Energy conservation    PT Next Visit Plan Continue with progressive LE strengthening in supine/seated position and continue with Standing balance/endurance in Parallel bars.    PT Home Exercise Plan Added heel cord stretching    Consulted and Agree with Plan of Care  Patient           Patient will benefit from skilled therapeutic intervention in order to improve the following deficits and impairments:  Decreased range of motion,Decreased strength,Impaired flexibility,Difficulty walking,Decreased mobility,Decreased activity tolerance,Decreased endurance,Impaired sensation,Decreased balance,Impaired tone  Visit Diagnosis: Muscle weakness (generalized)  Difficulty in walking, not elsewhere classified  Unsteadiness on feet  Paraplegia, incomplete (HCC)     Problem List There are no problems to display for this patient.   Colin Mulders PT, DPT 06/05/2020, 10:09 AM  [place] [facility] MAIN [facility] SERVICES [address] [place], [place], [address] Phone: [phone]   Fax:  [phone]  Name: Ryan Bush MRN: [id number] Date of Birth: 01/14/1976

## 2020-06-07 ENCOUNTER — Ambulatory Visit: Payer: 59

## 2020-06-07 ENCOUNTER — Other Ambulatory Visit: Payer: Self-pay

## 2020-06-07 DIAGNOSIS — G8222 Paraplegia, incomplete: Secondary | ICD-10-CM | POA: Diagnosis not present

## 2020-06-07 DIAGNOSIS — R262 Difficulty in walking, not elsewhere classified: Secondary | ICD-10-CM | POA: Diagnosis not present

## 2020-06-07 DIAGNOSIS — R2681 Unsteadiness on feet: Secondary | ICD-10-CM

## 2020-06-07 DIAGNOSIS — M6281 Muscle weakness (generalized): Secondary | ICD-10-CM

## 2020-06-07 DIAGNOSIS — B20 Human immunodeficiency virus [HIV] disease: Principal | ICD-10-CM

## 2020-06-07 MED ORDER — BICTEGRAVIR 50 MG-EMTRICITABINE 200 MG-TENOFOVIR ALAFENAM 25 MG TABLET
ORAL_TABLET | Freq: Every day | ORAL | 11 refills | 30 days
Start: 2020-06-07 — End: 2021-06-07

## 2020-06-07 MED ORDER — ATORVASTATIN 20 MG TABLET
ORAL_TABLET | Freq: Every day | ORAL | 11 refills | 30 days
Start: 2020-06-07 — End: 2021-06-07

## 2020-06-07 NOTE — Therapy (Signed)
[place] [facility] MAIN [facility] SERVICES [address] [place], [place], [address] Phone: [phone]   Fax:  [phone]  Physical Therapy Treatment  Patient Details  Name: Ryan Bush MRN: [id number] Date of Birth: 1976/01/18 Referring Provider (PT): Felton Clinton   Encounter Date: 06/07/2020   PT End of Session - 06/07/20 0929    Visit Number 9    Number of Visits 17    Date for PT Re-Evaluation 06/19/20    Authorization Type eval: 01/03    PT Start Time 0903    PT Stop Time 0956    PT Time Calculation (min) 53 min    Equipment Utilized During Treatment Gait belt    Activity Tolerance Patient tolerated treatment well;Patient limited by fatigue    Behavior During Therapy [facility] for tasks assessed/performed           Past Medical History:  Diagnosis Date  . HIV (human immunodeficiency virus infection) (HCC)     History reviewed. No pertinent surgical history.  There were no vitals filed for this visit.   Subjective Assessment - 06/07/20 0928    Subjective Patient reports he is doing ok, has been compliant with newer HEP.    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 Patient had PT and OT in the hospital for 3 months. He was DC from PT due to his insurance running out and he needed a different kind of PT. He was discharged home and he had HHPT 2 x week for 1 month. He was dischaged from HHPT and was supposed to get out patient PT but his insurance was cancelled. He has a new insurance now and has [place].    Limitations Standing;Walking    How long can you sit comfortably? unlimited    How long can you stand comfortably? 1-3 mins with BUE support    How long can you walk comfortably? unable    Patient Stated Goals to walk sideways at his house    Currently in Pain? No/denies           Manual Therapy: Bilateral hamstring stretch x 3  30s holds Bilateral heel cord stretch x 3 30s holds Bilateral hip abduction stretch x 3 30s holds Single knee to chest stretch x 3 30s holds  There Ex:  Seated hip adduction with ball 2"x20 Seated hip clamshell with yellow TB 2"x10  Seated foot presses into yellow dynadisc 2"x10 each  Seated marching x10 each, max assist on left  Standing in // bars: BUE support requiring minA-maxA to maintain standing position x2trials:- knee blocking each repetition  1:30 first rep, PT knee blocking and cueing for glute and quad set while standing feet together heavy use of bilateral UE support 1:30 second rep, PT knee blocking improved foot position, feet stayed nearly shoulder width apart, heavy use of bilateral UE support 3:45 third rep, improved alignment of body, presented with left knee buckling, heavy use of bilateral UE support                            PT Short Term Goals - 04/24/20 1038      PT SHORT TERM GOAL #1   Title Patient will be independent in home exercise program to improve strength/mobility for better functional independence with ADLs.    Time 4    Period Weeks    Status  New    Target Date 05/22/20      PT SHORT TERM GOAL #2   Title Patient will be able to stand in parallel bars with support of BUE for 30 seconds with adequate weight bearing through BLE.    Baseline 01/03: less than 30s    Time 4    Period Weeks    Status New    Target Date 05/22/20             PT Long Term Goals - 04/24/20 1042      PT LONG TERM GOAL #1   Title Patient will be able to stand in parallel bars with support of BUE for 30-60 seconds with adequate weight bearing through BLE.    Baseline 01/03: less than 30s    Time 8    Period Weeks    Status New    Target Date 06/19/20      PT LONG TERM GOAL #2   Title Patient will increase BLE gross strength half a grade as to improve functional strength for increased standing tolerance and increased ADL ability.     Time 8    Period Weeks    Status New    Target Date 06/19/20      PT LONG TERM GOAL #3   Title Patient will reduce joint contracture in BLE ankle plantarflexion by being able to achieve ankle dorsiflexion to neutral for better functional mobility    Baseline 01/03: RLE patient actively moves it by 4 degrees, unable to actively move LLE into DF    Time 8    Period Weeks    Status New    Target Date 06/19/20                 Plan - 06/07/20 1001    Clinical Impression Statement The patient presents with improved body position with standing, more neutral alignment.  The patient using heavy UE support and PT blocking knee to prevent buckling.  The patient continues with significant weakness in bilateral LEs left greater than right.  Patient continues to benefit from additional skilled PT services to improve LE strength and weightbearing abilities.    Personal Factors and Comorbidities Comorbidity 3+;Time since onset of injury/illness/exacerbation    Comorbidities HIV, Urinary retention with bladder stretch injurt, CNS toxoplasmosis (CMS-HCC), Paraplegia, incomplete, diabetic    Examination-Activity Limitations Stand;Locomotion Level    Examination-Participation Restrictions Driving    Stability/Clinical Decision Making Evolving/Moderate complexity    Rehab Potential Fair    PT Frequency 2x / week    PT Duration 8 weeks    PT Treatment/Interventions ADLs/Self Care Home Management;Electrical Stimulation;Moist Heat;Gait training;Stair training;Functional mobility training;Therapeutic activities;Therapeutic exercise;Balance training;Neuromuscular re-education;Wheelchair mobility training;Manual techniques;Passive range of motion;Energy conservation    PT Next Visit Plan Continue with progressive LE strengthening in supine/seated position and continue with Standing balance/endurance in Parallel bars.    PT Home Exercise Plan Added heel cord stretching    Consulted and Agree with Plan of  Care Patient           Patient will benefit from skilled therapeutic intervention in order to improve the following deficits and impairments:  Decreased range of motion,Decreased strength,Impaired flexibility,Difficulty walking,Decreased mobility,Decreased activity tolerance,Decreased endurance,Impaired sensation,Decreased balance,Impaired tone  Visit Diagnosis: Muscle weakness (generalized)  Difficulty in walking, not elsewhere classified  Unsteadiness on feet  Paraplegia, incomplete (HCC)     Problem List There are no problems to display for this patient.   Colin Mulders PT, DPT 06/07/2020, 10:04  AM  [place] [facility] MAIN [facility] SERVICES [address] [place], [place], [address] Phone: [phone]   Fax:  [phone]  Name: Ryan Bush MRN: [id number] Date of Birth: May 03, 1975

## 2020-06-09 MED ORDER — ATORVASTATIN 20 MG TABLET
ORAL_TABLET | Freq: Every day | ORAL | 11 refills | 30.00000 days | Status: CP
Start: 2020-06-09 — End: 2021-06-09

## 2020-06-12 ENCOUNTER — Ambulatory Visit: Payer: 59

## 2020-06-12 ENCOUNTER — Other Ambulatory Visit: Payer: Self-pay

## 2020-06-12 DIAGNOSIS — R262 Difficulty in walking, not elsewhere classified: Secondary | ICD-10-CM | POA: Diagnosis not present

## 2020-06-12 DIAGNOSIS — R2681 Unsteadiness on feet: Secondary | ICD-10-CM

## 2020-06-12 DIAGNOSIS — G8222 Paraplegia, incomplete: Secondary | ICD-10-CM | POA: Diagnosis not present

## 2020-06-12 DIAGNOSIS — M6281 Muscle weakness (generalized): Secondary | ICD-10-CM

## 2020-06-12 NOTE — Therapy (Signed)
[place] MAIN [facility] SERVICES [address] [place], [place], [address] Phone: [phone]   Fax:  [phone]  Physical Therapy Treatment Physical Therapy Progress Note   Dates of reporting period 04/24/2020   to   06/12/20  Patient Details  Name: Ryan Bush MRN: [id number] Date of Birth: Jun 24, 1975 Referring Provider (PT): Clarise Cruz   Encounter Date: 06/12/2020   PT End of Session - 06/12/20 1011    Visit Number 10    Number of Visits 17    Date for PT Re-Evaluation 06/19/20    Authorization Type eval: 01/03    PT Start Time 0905    PT Stop Time 1000    PT Time Calculation (min) 55 min    Equipment Utilized During Treatment Gait belt    Activity Tolerance Patient tolerated treatment well;Patient limited by fatigue    Behavior During Therapy [facility] for tasks assessed/performed           Past Medical History:  Diagnosis Date  . HIV (human immunodeficiency virus infection) ([place])     History reviewed. No pertinent surgical history.  There were no vitals filed for this visit.   Manual Therapy: Bilateral hamstring stretch x 3 30s holds Bilateral heel cord stretch x 3 30s holds Bilateral hip abduction stretch x 3 30s holds Single knee to chest stretch x 3 30s holds    There Ex:   Seated foot presses into yellow dynadisc 2"x10 each  Seated marching x10 each, max assist on left Prone hamstring curls, max assist bilaterally- able  Hamstring stretch seated 30"x2 each (issued for home) Calf stretch seated 30"x2 each with gait belt (issued for home) Standing in // bars: BUE support requiring minA-maxA to maintain standing position x 2 trials:- knee blocking each repetition   2x- PT knee blocking, min to mod A- heavy bilateral UE use on parallel bars 1 rep 3 mins 2 rep 2 mins                              PT Education - 06/12/20 0946    Education Details HEP and goal progress     Person(s) Educated Patient    Methods Explanation;Handout    Comprehension Verbalized understanding            PT Short Term Goals - 06/12/20 0948      PT SHORT TERM GOAL #1   Title Patient will be independent in home exercise program to improve strength/mobility for better functional independence with ADLs.    Baseline 06/12/20  pt performing HEP 1x/day.    Time 4    Period Weeks    Status Partially Met    Target Date 07/10/20      PT SHORT TERM GOAL #2   Title Patient will be able to stand in parallel bars with support of BUE for 30 seconds with adequate weight bearing through BLE.    Baseline 01/03: less than 30s, 06/12/20  pt able to stand in parallel bars 3 mins with min to mod A cueing for posture and knee blocking.    Time 4    Period Weeks    Status Achieved    Target Date 07/10/20             PT Long Term Goals - 06/12/20 1694      PT LONG TERM GOAL #1   Title Patient will be able to stand in parallel  bars with support of BUE for 30-60 seconds with adequate weight bearing through BLE.    Baseline 01/03: less than 30s, 06/12/20  pt able to stand in parallel bars 3 mins with min to mod A cueing for posture and knee blocking.    Time 8    Period Weeks    Status On-going    Target Date 07/17/20      PT LONG TERM GOAL #2   Title Patient will increase BLE gross strength half a grade as to improve functional strength for increased standing tolerance and increased ADL ability.    Baseline 06/12/20 requires max assist for most movements impacting left LE, right min to mod assist depending on muscle group.    Time 8    Period Weeks    Status On-going    Target Date 07/17/20      PT LONG TERM GOAL #3   Title Patient will reduce joint contracture in BLE ankle plantarflexion by being able to achieve ankle dorsiflexion to neutral for better functional mobility    Baseline 01/03: RLE patient actively moves it by 4 degrees, unable to actively move LLE into DF, 06/12/20  arom  into right DF 6 degrees, left unable, trace contraction dorsal foot with DF attempts on left.    Time 8    Period Weeks    Status On-going    Target Date 07/17/20                 Plan - 06/12/20 1015    Clinical Impression Statement The patient making gains overall.  The patient is able to better weightbearing through his LEs with improved alignment.  The patient does require heavy use of UEs and PT blocking knees.  Discussed with patient that the paperwork was sent in for him to get KAFOs and should anticipate a call. Patient's condition has the potential to improve in response to therapy. Maximum improvement is yet to be obtained. The anticipated improvement is attainable and reasonable in a generally predictable time    Personal Factors and Comorbidities Comorbidity 3+;Time since onset of injury/illness/exacerbation    Comorbidities HIV, Urinary retention with bladder stretch injurt, CNS toxoplasmosis (CMS-HCC), Paraplegia, incomplete, diabetic    Examination-Activity Limitations Stand;Locomotion Level    Examination-Participation Restrictions Driving    Stability/Clinical Decision Making Evolving/Moderate complexity    Rehab Potential Fair    PT Frequency 2x / week    PT Duration 8 weeks    PT Treatment/Interventions ADLs/Self Care Home Management;Electrical Stimulation;Moist Heat;Gait training;Stair training;Functional mobility training;Therapeutic activities;Therapeutic exercise;Balance training;Neuromuscular re-education;Wheelchair mobility training;Manual techniques;Passive range of motion;Energy conservation    PT Next Visit Plan Continue with progressive LE strengthening in supine/seated position and continue with Standing balance/endurance in Parallel bars.    PT Home Exercise Plan Added heel cord stretching    Consulted and Agree with Plan of Care Patient           Patient will benefit from skilled therapeutic intervention in order to improve the following deficits and  impairments:  Decreased range of motion,Decreased strength,Impaired flexibility,Difficulty walking,Decreased mobility,Decreased activity tolerance,Decreased endurance,Impaired sensation,Decreased balance,Impaired tone  Visit Diagnosis: Muscle weakness (generalized)  Difficulty in walking, not elsewhere classified  Unsteadiness on feet  Paraplegia, incomplete ([place])     Problem List There are no problems to display for this patient.   Hal Morales PT, DPT 06/12/2020, 10:18 AM  [place] MAIN [facility] SERVICES [address] [place], [place], [address] Phone: [phone]  Fax:  [phone]  Name: Dontre Laduca MRN: [id number] Date of Birth: 25-Oct-1975

## 2020-06-14 ENCOUNTER — Other Ambulatory Visit: Payer: Self-pay

## 2020-06-14 ENCOUNTER — Ambulatory Visit: Payer: 59

## 2020-06-14 DIAGNOSIS — M6281 Muscle weakness (generalized): Secondary | ICD-10-CM

## 2020-06-14 DIAGNOSIS — G8222 Paraplegia, incomplete: Secondary | ICD-10-CM | POA: Diagnosis not present

## 2020-06-14 DIAGNOSIS — R262 Difficulty in walking, not elsewhere classified: Secondary | ICD-10-CM | POA: Diagnosis not present

## 2020-06-14 DIAGNOSIS — R2681 Unsteadiness on feet: Secondary | ICD-10-CM | POA: Diagnosis not present

## 2020-06-14 NOTE — Therapy (Signed)
[place] MAIN [facility] SERVICES [address] [place], [place], [address] Phone: [phone]   Fax:  [phone]  Physical Therapy Treatment  Patient Details  Name: Ryan Bush MRN: [id number] Date of Birth: [birth year] Referring Provider (PT): Clarise Cruz   Encounter Date: 06/14/2020   PT End of Session - 06/14/20 1002    Visit Number 11    Number of Visits 17    Date for PT Re-Evaluation 06/19/20    Authorization Type eval: 01/03    PT Start Time 0910    PT Stop Time 0958    PT Time Calculation (min) 48 min    Equipment Utilized During Treatment Gait belt    Activity Tolerance Patient tolerated treatment well;Patient limited by fatigue    Behavior During Therapy [facility] for tasks assessed/performed           Past Medical History:  Diagnosis Date  . HIV (human immunodeficiency virus infection) ([place])     History reviewed. No pertinent surgical history.  There were no vitals filed for this visit.   Subjective Assessment - 06/14/20 0913    Subjective The patient reports doing fine today, no new issues.    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 Patient had PT and OT in the hospital for 3 months. He was DC from PT due to his insurance running out and he needed a different kind of PT. He was discharged home and he had HHPT 2 x week for 1 month. He was dischaged from [place] and was supposed to get out patient PT but his insurance was cancelled. He has a new insurance now and has [place].    Limitations Standing;Walking    How long can you sit comfortably? unlimited    How long can you stand comfortably? 1-3 mins with BUE support    How long can you walk comfortably? unable    Patient Stated Goals to walk sideways at his house    Currently in Pain? No/denies          TREATMENT  Manual Therapy: Bilateral hamstring stretch x 3  30s holds Bilateral heel cord stretch x 3 30s holds Bilateral hip abduction stretch x 3 30s holds Single knee to chest stretch x 3 30s holds   There Ex:   Seated foot presses into green dynadisc 5"x15 each  Seated marching x10 each, max assist on left  Standing in // bars: BUE support requiring minA-maxA to maintain standing position x 2 trials:- knee blocking each repetition  2x- PT knee blocking, min to mod A- heavy bilateral UE use on parallel bars Weight shifting in standing 1 rep 4 mins 2 rep 2.5 mins 3 reps 3.5 mins                        PT Education - 06/14/20 0952    Education Details Weight shifting, exercises    Person(s) Educated Patient    Methods Explanation    Comprehension Verbalized understanding            PT Short Term Goals - 06/12/20 0948      PT SHORT TERM GOAL #1   Title Patient will be independent in home exercise program to improve strength/mobility for better functional independence with ADLs.    Baseline 06/12/20  pt performing HEP 1x/day.    Time 4    Period Weeks  Status Partially Met    Target Date 07/10/20      PT SHORT TERM GOAL #2   Title Patient will be able to stand in parallel bars with support of BUE for 30 seconds with adequate weight bearing through BLE.    Baseline 01/03: less than 30s, 06/12/20  pt able to stand in parallel bars 3 mins with min to mod A cueing for posture and knee blocking.    Time 4    Period Weeks    Status Achieved    Target Date 07/10/20             PT Long Term Goals - 06/12/20 2725      PT LONG TERM GOAL #1   Title Patient will be able to stand in parallel bars with support of BUE for 30-60 seconds with adequate weight bearing through BLE.    Baseline 01/03: less than 30s, 06/12/20  pt able to stand in parallel bars 3 mins with min to mod A cueing for posture and knee blocking.    Time 8    Period Weeks    Status On-going    Target Date 07/17/20      PT LONG TERM GOAL #2    Title Patient will increase BLE gross strength half a grade as to improve functional strength for increased standing tolerance and increased ADL ability.    Baseline 06/12/20 requires max assist for most movements impacting left LE, right min to mod assist depending on muscle group.    Time 8    Period Weeks    Status On-going    Target Date 07/17/20      PT LONG TERM GOAL #3   Title Patient will reduce joint contracture in BLE ankle plantarflexion by being able to achieve ankle dorsiflexion to neutral for better functional mobility    Baseline 01/03: RLE patient actively moves it by 4 degrees, unable to actively move LLE into DF, 06/12/20  arom into right DF 6 degrees, left unable, trace contraction dorsal foot with DF attempts on left.    Time 8    Period Weeks    Status On-going    Target Date 07/17/20                 Plan - 06/14/20 0951    Clinical Impression Statement The patient challenged with standing in parallel bars with difficulty with foot placement and maintaining his knee in extension.  KAFOs have been prescribed to the patient which should help.  Patient able to stand in 3 trials for a total of 10 mins with PT assist with knee blocking.  The patient continues to benefit from additional skilled PT services to improve LE strength and ability to weightbear for improved functional capcity.    Personal Factors and Comorbidities Comorbidity 3+;Time since onset of injury/illness/exacerbation    Comorbidities HIV, Urinary retention with bladder stretch injurt, CNS toxoplasmosis (CMS-HCC), Paraplegia, incomplete, diabetic    Examination-Activity Limitations Stand;Locomotion Level    Examination-Participation Restrictions Driving    Stability/Clinical Decision Making Evolving/Moderate complexity    Rehab Potential Fair    PT Frequency 2x / week    PT Duration 8 weeks    PT Treatment/Interventions ADLs/Self Care Home Management;Electrical Stimulation;Moist Heat;Gait  training;Stair training;Functional mobility training;Therapeutic activities;Therapeutic exercise;Balance training;Neuromuscular re-education;Wheelchair mobility training;Manual techniques;Passive range of motion;Energy conservation    PT Next Visit Plan Continue with progressive LE strengthening in supine/seated position and continue with Standing balance/endurance in Parallel bars.    PT  Home Exercise Plan Added heel cord stretching    Consulted and Agree with Plan of Care Patient           Patient will benefit from skilled therapeutic intervention in order to improve the following deficits and impairments:  Decreased range of motion,Decreased strength,Impaired flexibility,Difficulty walking,Decreased mobility,Decreased activity tolerance,Decreased endurance,Impaired sensation,Decreased balance,Impaired tone  Visit Diagnosis: Muscle weakness (generalized)  Difficulty in walking, not elsewhere classified  Unsteadiness on feet  Paraplegia, incomplete ([place])     Problem List There are no problems to display for this patient.   Hal Morales PT, DPT 06/14/2020, 10:03 AM  [place] MAIN [facility] SERVICES [address] [place], [place], [address] Phone: [phone]   Fax:  [phone]  Name: Ryan Bush MRN: [id number] Date of Birth: 09-11-1975

## 2020-06-19 ENCOUNTER — Ambulatory Visit: Payer: 59

## 2020-06-19 ENCOUNTER — Other Ambulatory Visit: Payer: Self-pay

## 2020-06-19 DIAGNOSIS — R2681 Unsteadiness on feet: Secondary | ICD-10-CM

## 2020-06-19 DIAGNOSIS — R262 Difficulty in walking, not elsewhere classified: Secondary | ICD-10-CM

## 2020-06-19 DIAGNOSIS — G8222 Paraplegia, incomplete: Secondary | ICD-10-CM

## 2020-06-19 DIAGNOSIS — M6281 Muscle weakness (generalized): Secondary | ICD-10-CM

## 2020-06-19 NOTE — Therapy (Signed)
[place] MAIN [facility] SERVICES [address] [place], [place], [address] Phone: [phone]   Fax:  [phone]  Physical Therapy Treatment/RECERT  Patient Details  Name: Ryan Bush MRN: [id number] Date of Birth: [birth year] Referring Provider (PT): Clarise Cruz   Encounter Date: 06/19/2020   PT End of Session - 06/19/20 1000    Visit Number 12    Number of Visits 28    Date for PT Re-Evaluation 08/14/20    Authorization Type eval: 01/03    PT Start Time 0902    PT Stop Time 0946    PT Time Calculation (min) 44 min    Equipment Utilized During Treatment Gait belt    Activity Tolerance Patient tolerated treatment well;Patient limited by fatigue    Behavior During Therapy [facility] for tasks assessed/performed           Past Medical History:  Diagnosis Date  . HIV (human immunodeficiency virus infection) ([place])     History reviewed. No pertinent surgical history.  There were no vitals filed for this visit.   Subjective Assessment - 06/19/20 0957    Subjective Patient reports hes doing well, has been compliant with his HEP. No falls or LOB, no pain reported.    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 Patient had PT and OT in the hospital for 3 months. He was DC from PT due to his insurance running out and he needed a different kind of PT. He was discharged home and he had HHPT 2 x week for 1 month. He was dischaged from [place] and was supposed to get out patient PT but his insurance was cancelled. He has a new insurance now and has [place].    Limitations Standing;Walking    How long can you sit comfortably? unlimited    How long can you stand comfortably? 1-3 mins with BUE support    How long can you walk comfortably? unable    Patient Stated Goals to walk sideways at his house    Currently in Pain? No/denies              Interpreter Milly present   TREATMENT   Manual Therapy: Bilateral hamstring stretch x 3 30s holds Bilateral heel cord stretch x 3 30s holds Bilateral hip abduction stretch x 3 30s holds Single knee to chest stretch x 3 30s holds   There Ex: Quadruped:  Static hold 30 seconds  Weight shift 10x each side with min A from PT for stabilization UE reaches, cues for core activation, min A for stabilization 10x each UE Modified child pose requires assistance to obtain and get out of position Seated hamstring and calf stretch with band LLE 30 second hold  Seated foot presses into green dynadisc 5"x15 each  Seated df/pf RLE AROM on green dynadisc 15x, AAROM LLE 15x on green dynadisc Seated marching x10 each, max assist on left   Standing in // bars: BUE support requiring maxA to maintain standing position x 4 trials:- knee blocking each repetition  - PT knee blocking,  mod A- heavy bilateral UE use on parallel bars; requires cueing and stabilization to reduce excessive plantarflexion for improved weight acceptance through limbs.  Weight shifting in standing. 4 repetitions of varying length of time with decreasing stabilization provided by PT for increased weight acceptance by patient.       Goals performed 06/12/20; please refer to this  note for further details in regards to recert. He is progressing with goals in regards to LE movement, body mechanics, and tolerance to standing position with assistance. Patient is able to perform improved weightbearing in standing as well as improved muscle tissue lengthening interventions. Still awaiting KAFO's at this time for standing interventions. The patient continues to benefit from additional skilled PT services to improve LE strength and ability to weightbear for improved functional capcity.                  PT Education - 06/19/20 0958    Education Details exercise technique, body mechanics, POC    Person(s) Educated Patient     Methods Explanation;Demonstration;Tactile cues;Verbal cues    Comprehension Verbalized understanding;Returned demonstration;Verbal cues required;Tactile cues required            PT Short Term Goals - 06/12/20 0948      PT SHORT TERM GOAL #1   Title Patient will be independent in home exercise program to improve strength/mobility for better functional independence with ADLs.    Baseline 06/12/20  pt performing HEP 1x/day.    Time 4    Period Weeks    Status Partially Met    Target Date 07/10/20      PT SHORT TERM GOAL #2   Title Patient will be able to stand in parallel bars with support of BUE for 30 seconds with adequate weight bearing through BLE.    Baseline 01/03: less than 30s, 06/12/20  pt able to stand in parallel bars 3 mins with min to mod A cueing for posture and knee blocking.    Time 4    Period Weeks    Status Achieved    Target Date 07/10/20             PT Long Term Goals - 06/19/20 1001      PT LONG TERM GOAL #1   Title Patient will be able to stand in parallel bars with support of BUE for 30-60 seconds with adequate weight bearing through BLE.    Baseline 01/03: less than 30s, 06/12/20  pt able to stand in parallel bars 3 mins with min to mod A cueing for posture and knee blocking.    Time 8    Period Weeks    Status Partially Met    Target Date 08/14/20      PT LONG TERM GOAL #2   Title Patient will increase BLE gross strength half a grade as to improve functional strength for increased standing tolerance and increased ADL ability.    Baseline 06/12/20 requires max assist for most movements impacting left LE, right min to mod assist depending on muscle group.    Time 8    Period Weeks    Status Partially Met    Target Date 08/14/20      PT LONG TERM GOAL #3   Title Patient will reduce joint contracture in BLE ankle plantarflexion by being able to achieve ankle dorsiflexion to neutral for better functional mobility    Baseline 01/03: RLE patient  actively moves it by 4 degrees, unable to actively move LLE into DF, 06/12/20  arom into right DF 6 degrees, left unable, trace contraction dorsal foot with DF attempts on left.    Time 8    Period Weeks    Status Partially Met    Target Date 08/14/20                 Plan - 06/19/20  1003    Clinical Impression Statement Goals performed 06/12/20; please refer to this note for further details in regards to recert. He is progressing with goals in regards to LE movement, body mechanics, and tolerance to standing position with assistance. Patient is able to perform improved weightbearing in standing as well as improved muscle tissue lengthening interventions. Still awaiting KAFO's at this time for standing interventions. The patient continues to benefit from additional skilled PT services to improve LE strength and ability to weightbear for improved functional capcity.    Personal Factors and Comorbidities Comorbidity 3+;Time since onset of injury/illness/exacerbation    Comorbidities HIV, Urinary retention with bladder stretch injurt, CNS toxoplasmosis (CMS-HCC), Paraplegia, incomplete, diabetic    Examination-Activity Limitations Stand;Locomotion Level    Examination-Participation Restrictions Driving    Stability/Clinical Decision Making Evolving/Moderate complexity    Rehab Potential Fair    PT Frequency 2x / week    PT Duration 8 weeks    PT Treatment/Interventions ADLs/Self Care Home Management;Electrical Stimulation;Moist Heat;Gait training;Stair training;Functional mobility training;Therapeutic activities;Therapeutic exercise;Balance training;Neuromuscular re-education;Wheelchair mobility training;Manual techniques;Passive range of motion;Energy conservation    PT Next Visit Plan Continue with progressive LE strengthening in supine/seated position and continue with Standing balance/endurance in Parallel bars.    PT Home Exercise Plan Added heel cord stretching    Consulted and Agree with  Plan of Care Patient           Patient will benefit from skilled therapeutic intervention in order to improve the following deficits and impairments:  Decreased range of motion,Decreased strength,Impaired flexibility,Difficulty walking,Decreased mobility,Decreased activity tolerance,Decreased endurance,Impaired sensation,Decreased balance,Impaired tone  Visit Diagnosis: Muscle weakness (generalized)  Difficulty in walking, not elsewhere classified  Unsteadiness on feet  Paraplegia, incomplete ([place])     Problem List There are no problems to display for this patient.  Janna Arch, PT, DPT   06/19/2020, 10:04 AM  [place] MAIN [facility] SERVICES [address] [place], [place], [address] Phone: [phone]   Fax:  [phone]  Name: Ryan Bush MRN: [id number] Date of Birth: 08-25-1975

## 2020-06-21 ENCOUNTER — Ambulatory Visit: Payer: 59 | Attending: Physical Medicine and Rehabilitation

## 2020-06-21 ENCOUNTER — Other Ambulatory Visit: Payer: Self-pay

## 2020-06-21 DIAGNOSIS — R262 Difficulty in walking, not elsewhere classified: Secondary | ICD-10-CM

## 2020-06-21 DIAGNOSIS — R2681 Unsteadiness on feet: Secondary | ICD-10-CM | POA: Insufficient documentation

## 2020-06-21 DIAGNOSIS — M6281 Muscle weakness (generalized): Secondary | ICD-10-CM

## 2020-06-21 DIAGNOSIS — G8222 Paraplegia, incomplete: Secondary | ICD-10-CM | POA: Insufficient documentation

## 2020-06-21 NOTE — Therapy (Signed)
[place] MAIN [facility] SERVICES [address] [place], [place], [address] Phone: [phone]   Fax:  [phone]  Physical Therapy Treatment  Patient Details  Name: Ryan Bush MRN: [id number] Date of Birth: [birth year] Referring Provider (PT): Clarise Cruz   Encounter Date: 06/21/2020   PT End of Session - 06/21/20 0956    Visit Number 13    Number of Visits 28    Date for PT Re-Evaluation 08/14/20    Authorization Type eval: 01/03    PT Start Time 0905    PT Stop Time 0946    PT Time Calculation (min) 41 min    Equipment Utilized During Treatment Gait belt    Activity Tolerance Patient tolerated treatment well;Patient limited by fatigue    Behavior During Therapy [facility] for tasks assessed/performed           Past Medical History:  Diagnosis Date  . HIV (human immunodeficiency virus infection) ([place])     History reviewed. No pertinent surgical history.  There were no vitals filed for this visit.   Subjective Assessment - 06/21/20 0955    Subjective Patient reports he has been compliant with HEP. Got some paint on his hand from his 45 year old daughter. No falls or LOB since last session.    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 Patient had PT and OT in the hospital for 3 months. He was DC from PT due to his insurance running out and he needed a different kind of PT. He was discharged home and he had HHPT 2 x week for 1 month. He was dischaged from [place] and was supposed to get out patient PT but his insurance was cancelled. He has a new insurance now and has [place].    Limitations Standing;Walking    How long can you sit comfortably? unlimited    How long can you stand comfortably? 1-3 mins with BUE support    How long can you walk comfortably? unable    Patient Stated Goals to walk sideways at his house    Currently in  Pain? No/denies               Interpreter [place] present  TREATMENT   Manual Therapy: Bilateral hamstring stretch x 3 30s holds Bilateral heel cord stretch x 3 30s holds Bilateral hip abduction stretch x 3 30s holds Single knee to chest stretch x 3 30s holds   There Ex: Quadruped:  Static hold 30 seconds  Weight shift 10x each side with min A from PT for stabilization UE reaches, cues for core activation, min A for stabilization 10x each UE Seated foot presses into green dynadisc 5"x15 each  Seated foot press/hamstring isometric into bosu ball (round side up) 10x 5 second holds each LE  Seated df/pf on prostretch 20x  Standing in // bars: BUE support requiring maxA to maintain standing position x 2 trials:- knee blocking each repetition  - PT knee blocking,  mod A- heavy bilateral UE use on parallel bars; requires cueing and stabilization to reduce excessive plantarflexion for improved weight acceptance through limbs.  -partial sit to stand for clearance of bottom from chair, heavy Stabilization to LE's and knees for alignment and muscle recruitment to increase LE recruitment for transfer rather than depending on UE's    Patient performed with instruction, verbal cues, tactile cues of therapist: goal: increase tissue extensibility, promote proper  posture, improve mobility    Patient is highly motivated and eager to participate in progressions. He tolerates increased challenge interventions to increase recruitment of musculature of LE's well. He continues to be challenged by fatigue with prolonged recruitment patterns with return to compensatory mechanisms.  The patient continues to benefit from additional skilled PT services to improve LE strength and ability to weight bear for improved functional capacity.                       PT Education - 06/21/20 0956    Education Details exercise technique, body mechanics,    Person(s) Educated Patient    Methods  Explanation;Tactile cues;Demonstration;Verbal cues    Comprehension Verbalized understanding;Returned demonstration;Verbal cues required;Tactile cues required            PT Short Term Goals - 06/12/20 0948      PT SHORT TERM GOAL #1   Title Patient will be independent in home exercise program to improve strength/mobility for better functional independence with ADLs.    Baseline 06/12/20  pt performing HEP 1x/day.    Time 4    Period Weeks    Status Partially Met    Target Date 07/10/20      PT SHORT TERM GOAL #2   Title Patient will be able to stand in parallel bars with support of BUE for 30 seconds with adequate weight bearing through BLE.    Baseline 01/03: less than 30s, 06/12/20  pt able to stand in parallel bars 3 mins with min to mod A cueing for posture and knee blocking.    Time 4    Period Weeks    Status Achieved    Target Date 07/10/20             PT Long Term Goals - 06/19/20 1001      PT LONG TERM GOAL #1   Title Patient will be able to stand in parallel bars with support of BUE for 30-60 seconds with adequate weight bearing through BLE.    Baseline 01/03: less than 30s, 06/12/20  pt able to stand in parallel bars 3 mins with min to mod A cueing for posture and knee blocking.    Time 8    Period Weeks    Status Partially Met    Target Date 08/14/20      PT LONG TERM GOAL #2   Title Patient will increase BLE gross strength half a grade as to improve functional strength for increased standing tolerance and increased ADL ability.    Baseline 06/12/20 requires max assist for most movements impacting left LE, right min to mod assist depending on muscle group.    Time 8    Period Weeks    Status Partially Met    Target Date 08/14/20      PT LONG TERM GOAL #3   Title Patient will reduce joint contracture in BLE ankle plantarflexion by being able to achieve ankle dorsiflexion to neutral for better functional mobility    Baseline 01/03: RLE patient actively moves it  by 4 degrees, unable to actively move LLE into DF, 06/12/20  arom into right DF 6 degrees, left unable, trace contraction dorsal foot with DF attempts on left.    Time 8    Period Weeks    Status Partially Met    Target Date 08/14/20                 Plan - 06/21/20 1000  Clinical Impression Statement Patient is highly motivated and eager to participate in progressions. He tolerates increased challenge interventions to increase recruitment of musculature of LE's well. He continues to be challenged by fatigue with prolonged recruitment patterns with return to compensatory mechanisms.  The patient continues to benefit from additional skilled PT services to improve LE strength and ability to weight bear for improved functional capacity.    Personal Factors and Comorbidities Comorbidity 3+;Time since onset of injury/illness/exacerbation    Comorbidities HIV, Urinary retention with bladder stretch injurt, CNS toxoplasmosis (CMS-HCC), Paraplegia, incomplete, diabetic    Examination-Activity Limitations Stand;Locomotion Level    Examination-Participation Restrictions Driving    Stability/Clinical Decision Making Evolving/Moderate complexity    Rehab Potential Fair    PT Frequency 2x / week    PT Duration 8 weeks    PT Treatment/Interventions ADLs/Self Care Home Management;Electrical Stimulation;Moist Heat;Gait training;Stair training;Functional mobility training;Therapeutic activities;Therapeutic exercise;Balance training;Neuromuscular re-education;Wheelchair mobility training;Manual techniques;Passive range of motion;Energy conservation    PT Next Visit Plan Continue with progressive LE strengthening in supine/seated position and continue with Standing balance/endurance in Parallel bars.    PT Home Exercise Plan Added heel cord stretching    Consulted and Agree with Plan of Care Patient           Patient will benefit from skilled therapeutic intervention in order to improve the following  deficits and impairments:  Decreased range of motion,Decreased strength,Impaired flexibility,Difficulty walking,Decreased mobility,Decreased activity tolerance,Decreased endurance,Impaired sensation,Decreased balance,Impaired tone  Visit Diagnosis: Muscle weakness (generalized)  Difficulty in walking, not elsewhere classified  Unsteadiness on feet  Paraplegia, incomplete ([place])     Problem List There are no problems to display for this patient.  Janna Arch, PT, DPT    06/21/2020, 10:03 AM  [place] MAIN [facility] SERVICES [address] [place], [place], [address] Phone: [phone]   Fax:  [phone]  Name: Ryan Bush MRN: [id number] Date of Birth: [birth year]

## 2020-06-26 ENCOUNTER — Other Ambulatory Visit: Payer: Self-pay

## 2020-06-26 ENCOUNTER — Ambulatory Visit: Payer: 59

## 2020-06-26 DIAGNOSIS — R262 Difficulty in walking, not elsewhere classified: Secondary | ICD-10-CM | POA: Diagnosis not present

## 2020-06-26 DIAGNOSIS — R2681 Unsteadiness on feet: Secondary | ICD-10-CM

## 2020-06-26 DIAGNOSIS — M6281 Muscle weakness (generalized): Secondary | ICD-10-CM | POA: Diagnosis not present

## 2020-06-26 DIAGNOSIS — G8222 Paraplegia, incomplete: Secondary | ICD-10-CM | POA: Diagnosis not present

## 2020-06-26 NOTE — Therapy (Signed)
[place] MAIN [facility] SERVICES [address] [place], [place], [address] Phone: [phone]   Fax:  [phone]  Physical Therapy Treatment  Patient Details  Name: Ryan Bush MRN: [id number] Date of Birth: 1975-09-17 Referring Provider (PT): Clarise Cruz   Encounter Date: 06/26/2020   PT End of Session - 06/26/20 0933    Visit Number 14    Number of Visits 28    Date for PT Re-Evaluation 08/14/20    Authorization Type eval: 01/03    PT Start Time 0859    PT Stop Time 0930    PT Time Calculation (min) 31 min    Equipment Utilized During Treatment Gait belt    Activity Tolerance Patient tolerated treatment well;Patient limited by fatigue    Behavior During Therapy [facility] for tasks assessed/performed           Past Medical History:  Diagnosis Date  . HIV (human immunodeficiency virus infection) ([place])     History reviewed. No pertinent surgical history.  There were no vitals filed for this visit.   Subjective Assessment - 06/26/20 0932    Subjective Patient arrived late to session due to mixeup of schedule times.    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 Patient had PT and OT in the hospital for 3 months. He was DC from PT due to his insurance running out and he needed a different kind of PT. He was discharged home and he had HHPT 2 x week for 1 month. He was dischaged from [place] and was supposed to get out patient PT but his insurance was cancelled. He has a new insurance now and has [place].    Limitations Standing;Walking    How long can you sit comfortably? unlimited    How long can you stand comfortably? 1-3 mins with BUE support    How long can you walk comfortably? unable    Patient Stated Goals to walk sideways at his house    Currently in Pain? No/denies                   Interpreter [name] present   TREATMENT   Manual Therapy: Bilateral hamstring stretch x  30s holds Bilateral heel cord stretch x  30s holds Bilateral hip abduction stretch x  30s holds Single knee to chest stretch x  30s holds   There Ex: Supine: Bridge 10x, cues for breathing for optimal muscle recruitment.  Quadruped:  Static hold 30 seconds  UE reaches, cues for core activation, min A for stabilization 10x each UE Seated foot press/hamstring isometric into bosu ball (round side up) 10x 5 second holds each LE     Standing in // bars: BUE support requiring maxA to maintain standing position x 2 trials:- knee blocking each repetition  - PT knee blocking,  mod A- heavy bilateral UE use on parallel bars; requires cueing and stabilization to reduce excessive plantarflexion for improved weight acceptance through limbs.  -partial sit to stand for clearance of bottom from chair, heavy Stabilization to LE's and knees for alignment and muscle recruitment to increase LE recruitment for transfer rather than depending on UE's      Patient performed with instruction, verbal cues, tactile cues of therapist: goal: increase tissue extensibility, promote proper posture, improve mobility    Patient's session is limited by late arrival due to mixup of time of appointment. Patient remains highly  motivated however is limited in LLE performance due to limited warmup time prior to standing.  He continues to be challenged by fatigue with prolonged recruitment patterns with return to compensatory mechanisms. The patient continues to benefit from additional skilled PT services to improve LE strength and ability to weight bear for improved functional capacity.                    PT Education - 06/26/20 0933    Education Details exercise technique, body mechanics    Person(s) Educated Patient    Methods Explanation;Demonstration;Tactile cues;Verbal cues    Comprehension Verbalized understanding;Returned demonstration;Verbal  cues required;Tactile cues required            PT Short Term Goals - 06/12/20 0948      PT SHORT TERM GOAL #1   Title Patient will be independent in home exercise program to improve strength/mobility for better functional independence with ADLs.    Baseline 06/12/20  pt performing HEP 1x/day.    Time 4    Period Weeks    Status Partially Met    Target Date 07/10/20      PT SHORT TERM GOAL #2   Title Patient will be able to stand in parallel bars with support of BUE for 30 seconds with adequate weight bearing through BLE.    Baseline 01/03: less than 30s, 06/12/20  pt able to stand in parallel bars 3 mins with min to mod A cueing for posture and knee blocking.    Time 4    Period Weeks    Status Achieved    Target Date 07/10/20             PT Long Term Goals - 06/19/20 1001      PT LONG TERM GOAL #1   Title Patient will be able to stand in parallel bars with support of BUE for 30-60 seconds with adequate weight bearing through BLE.    Baseline 01/03: less than 30s, 06/12/20  pt able to stand in parallel bars 3 mins with min to mod A cueing for posture and knee blocking.    Time 8    Period Weeks    Status Partially Met    Target Date 08/14/20      PT LONG TERM GOAL #2   Title Patient will increase BLE gross strength half a grade as to improve functional strength for increased standing tolerance and increased ADL ability.    Baseline 06/12/20 requires max assist for most movements impacting left LE, right min to mod assist depending on muscle group.    Time 8    Period Weeks    Status Partially Met    Target Date 08/14/20      PT LONG TERM GOAL #3   Title Patient will reduce joint contracture in BLE ankle plantarflexion by being able to achieve ankle dorsiflexion to neutral for better functional mobility    Baseline 01/03: RLE patient actively moves it by 4 degrees, unable to actively move LLE into DF, 06/12/20  arom into right DF 6 degrees, left unable, trace contraction  dorsal foot with DF attempts on left.    Time 8    Period Weeks    Status Partially Met    Target Date 08/14/20                 Plan - 06/26/20 0934    Clinical Impression Statement Patient's session is limited by late arrival due to mixup of time  of appointment. Patient remains highly motivated however is limited in LLE performance due to limited warmup time prior to standing.  He continues to be challenged by fatigue with prolonged recruitment patterns with return to compensatory mechanisms. The patient continues to benefit from additional skilled PT services to improve LE strength and ability to weight bear for improved functional capacity.    Personal Factors and Comorbidities Comorbidity 3+;Time since onset of injury/illness/exacerbation    Comorbidities HIV, Urinary retention with bladder stretch injurt, CNS toxoplasmosis (CMS-HCC), Paraplegia, incomplete, diabetic    Examination-Activity Limitations Stand;Locomotion Level    Examination-Participation Restrictions Driving    Stability/Clinical Decision Making Evolving/Moderate complexity    Rehab Potential Fair    PT Frequency 2x / week    PT Duration 8 weeks    PT Treatment/Interventions ADLs/Self Care Home Management;Electrical Stimulation;Moist Heat;Gait training;Stair training;Functional mobility training;Therapeutic activities;Therapeutic exercise;Balance training;Neuromuscular re-education;Wheelchair mobility training;Manual techniques;Passive range of motion;Energy conservation    PT Next Visit Plan Continue with progressive LE strengthening in supine/seated position and continue with Standing balance/endurance in Parallel bars.    PT Home Exercise Plan Added heel cord stretching    Consulted and Agree with Plan of Care Patient           Patient will benefit from skilled therapeutic intervention in order to improve the following deficits and impairments:  Decreased range of motion,Decreased strength,Impaired  flexibility,Difficulty walking,Decreased mobility,Decreased activity tolerance,Decreased endurance,Impaired sensation,Decreased balance,Impaired tone  Visit Diagnosis: Muscle weakness (generalized)  Difficulty in walking, not elsewhere classified  Unsteadiness on feet     Problem List There are no problems to display for this patient.  Janna Arch, PT, DPT   06/26/2020, 9:35 AM  [place] MAIN [facility] SERVICES [address] [place], [place], [address] Phone: [phone]   Fax:  [phone]  Name: Zephyr Ridley MRN: [id number] Date of Birth: April 19, 1976

## 2020-06-28 ENCOUNTER — Ambulatory Visit: Payer: 59

## 2020-06-28 ENCOUNTER — Other Ambulatory Visit: Payer: Self-pay

## 2020-06-28 DIAGNOSIS — R262 Difficulty in walking, not elsewhere classified: Secondary | ICD-10-CM | POA: Diagnosis not present

## 2020-06-28 DIAGNOSIS — R339 Retention of urine, unspecified: Secondary | ICD-10-CM | POA: Diagnosis not present

## 2020-06-28 DIAGNOSIS — M6281 Muscle weakness (generalized): Secondary | ICD-10-CM | POA: Diagnosis not present

## 2020-06-28 DIAGNOSIS — G8222 Paraplegia, incomplete: Secondary | ICD-10-CM | POA: Diagnosis not present

## 2020-06-28 DIAGNOSIS — R2681 Unsteadiness on feet: Secondary | ICD-10-CM | POA: Diagnosis not present

## 2020-06-28 NOTE — Therapy (Signed)
[place] MAIN [facility] SERVICES [address] [place], [place], [address] Phone: [phone]   Fax:  [phone]  Physical Therapy Treatment  Patient Details  Name: Ryan Bush MRN: [id number] Date of Birth: [birth year] Referring Provider (PT): Clarise Cruz   Encounter Date: 06/28/2020   PT End of Session - 06/28/20 0858    Visit Number 15    Number of Visits 28    Date for PT Re-Evaluation 08/14/20    Authorization Type eval: 01/03    PT Start Time 0900    PT Stop Time 0944    PT Time Calculation (min) 44 min    Equipment Utilized During Treatment Gait belt    Activity Tolerance Patient tolerated treatment well;Patient limited by fatigue    Behavior During Therapy [facility] for tasks assessed/performed           Past Medical History:  Diagnosis Date  . HIV (human immunodeficiency virus infection) ([place])     History reviewed. No pertinent surgical history.  There were no vitals filed for this visit.   Subjective Assessment - 06/28/20 0921    Subjective Patient reports compliance with HEP, noticing increased movement in R leg.    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 Patient had PT and OT in the hospital for 3 months. He was DC from PT due to his insurance running out and he needed a different kind of PT. He was discharged home and he had HHPT 2 x week for 1 month. He was dischaged from [facility] and was supposed to get out patient PT but his insurance was cancelled. He has a new insurance now and has [place].    Limitations Standing;Walking    How long can you sit comfortably? unlimited    How long can you stand comfortably? 1-3 mins with BUE support    How long can you walk comfortably? unable    Patient Stated Goals to walk sideways at his house    Currently in Pain? No/denies                  Interpreter [place] present   TREATMENT   Manual Therapy: Bilateral hamstring stretch x 3 30s holds Bilateral heel cord stretch x 3 30s holds Bilateral hip abduction stretch x 3 30s holds Single knee to chest stretch x 3 30s holds   There Ex: Supine: Bridge 10x Hip flexion LLE AAROM 10x, RLE AROM 10x Quadruped:  Static hold 30 seconds  Weight shift 10x each side with min A from PT for stabilization UE reaches, cues for core activation, min A for stabilization 10x each UE Seated foot presses into green dynadisc 5"x15 each  Seated foot press/hamstring isometric into bosu ball (round side up) 10x 5 second holds each LE  Seated df/pf on prostretch 20x   Standing in // bars: BUE support requiring maxA to maintain standing position x 3 trials:- knee blocking each repetition  - PT knee blocking,  mod A- heavy bilateral UE use on parallel bars; requires cueing and stabilization to reduce excessive plantarflexion for improved weight acceptance through limbs.  -partial sit to stand for clearance of bottom from chair, heavy Stabilization to LE's and knees for alignment and muscle recruitment to increase LE recruitment for transfer rather than depending on UE's; 10x's 2 sets       Patient performed with instruction, verbal cues, tactile cues of therapist: goal:  increase tissue extensibility, promote proper posture, improve mobility     Patient is progressively improving tolerance to standing with increased recruitment of LE's and decreased reliance on UE's. Patient tolerates increased standing and partial standing interventions. The patient continues to benefit from additional skilled PT services to improve LE strength and ability to weight bear for improved functional capacity.                       PT Education - 06/28/20 0858    Education Details exercise technique, body mechanics    Person(s) Educated Patient    Methods Explanation;Demonstration;Tactile cues;Verbal cues    Comprehension Verbalized  understanding;Returned demonstration;Verbal cues required;Tactile cues required            PT Short Term Goals - 06/12/20 0948      PT SHORT TERM GOAL #1   Title Patient will be independent in home exercise program to improve strength/mobility for better functional independence with ADLs.    Baseline 06/12/20  pt performing HEP 1x/day.    Time 4    Period Weeks    Status Partially Met    Target Date 07/10/20      PT SHORT TERM GOAL #2   Title Patient will be able to stand in parallel bars with support of BUE for 30 seconds with adequate weight bearing through BLE.    Baseline 01/03: less than 30s, 06/12/20  pt able to stand in parallel bars 3 mins with min to mod A cueing for posture and knee blocking.    Time 4    Period Weeks    Status Achieved    Target Date 07/10/20             PT Long Term Goals - 06/19/20 1001      PT LONG TERM GOAL #1   Title Patient will be able to stand in parallel bars with support of BUE for 30-60 seconds with adequate weight bearing through BLE.    Baseline 01/03: less than 30s, 06/12/20  pt able to stand in parallel bars 3 mins with min to mod A cueing for posture and knee blocking.    Time 8    Period Weeks    Status Partially Met    Target Date 08/14/20      PT LONG TERM GOAL #2   Title Patient will increase BLE gross strength half a grade as to improve functional strength for increased standing tolerance and increased ADL ability.    Baseline 06/12/20 requires max assist for most movements impacting left LE, right min to mod assist depending on muscle group.    Time 8    Period Weeks    Status Partially Met    Target Date 08/14/20      PT LONG TERM GOAL #3   Title Patient will reduce joint contracture in BLE ankle plantarflexion by being able to achieve ankle dorsiflexion to neutral for better functional mobility    Baseline 01/03: RLE patient actively moves it by 4 degrees, unable to actively move LLE into DF, 06/12/20  arom into right DF  6 degrees, left unable, trace contraction dorsal foot with DF attempts on left.    Time 8    Period Weeks    Status Partially Met    Target Date 08/14/20                 Plan - 06/28/20 1056    Clinical Impression Statement Patient is progressively improving  tolerance to standing with increased recruitment of LE's and decreased reliance on UE's. Patient tolerates increased standing and partial standing interventions. The patient continues to benefit from additional skilled PT services to improve LE strength and ability to weight bear for improved functional capacity.    Personal Factors and Comorbidities Comorbidity 3+;Time since onset of injury/illness/exacerbation    Comorbidities HIV, Urinary retention with bladder stretch injurt, CNS toxoplasmosis (CMS-HCC), Paraplegia, incomplete, diabetic    Examination-Activity Limitations Stand;Locomotion Level    Examination-Participation Restrictions Driving    Stability/Clinical Decision Making Evolving/Moderate complexity    Rehab Potential Fair    PT Frequency 2x / week    PT Duration 8 weeks    PT Treatment/Interventions ADLs/Self Care Home Management;Electrical Stimulation;Moist Heat;Gait training;Stair training;Functional mobility training;Therapeutic activities;Therapeutic exercise;Balance training;Neuromuscular re-education;Wheelchair mobility training;Manual techniques;Passive range of motion;Energy conservation    PT Next Visit Plan Continue with progressive LE strengthening in supine/seated position and continue with Standing balance/endurance in Parallel bars.    PT Home Exercise Plan Added heel cord stretching    Consulted and Agree with Plan of Care Patient           Patient will benefit from skilled therapeutic intervention in order to improve the following deficits and impairments:  Decreased range of motion,Decreased strength,Impaired flexibility,Difficulty walking,Decreased mobility,Decreased activity tolerance,Decreased  endurance,Impaired sensation,Decreased balance,Impaired tone  Visit Diagnosis: Muscle weakness (generalized)  Difficulty in walking, not elsewhere classified  Unsteadiness on feet     Problem List There are no problems to display for this patient.  Janna Arch, PT, DPT   06/28/2020, 10:57 AM  [place] MAIN [facility] SERVICES [address] [place], [place], [address] Phone: [phone]   Fax:  [phone]  Name: Ryan Bush MRN: [id number] Date of Birth: May 28, 1975

## 2020-07-03 ENCOUNTER — Other Ambulatory Visit: Payer: Self-pay

## 2020-07-03 ENCOUNTER — Ambulatory Visit: Payer: 59

## 2020-07-03 DIAGNOSIS — G8222 Paraplegia, incomplete: Secondary | ICD-10-CM | POA: Diagnosis not present

## 2020-07-03 DIAGNOSIS — M6281 Muscle weakness (generalized): Secondary | ICD-10-CM

## 2020-07-03 DIAGNOSIS — R2681 Unsteadiness on feet: Secondary | ICD-10-CM

## 2020-07-03 DIAGNOSIS — R262 Difficulty in walking, not elsewhere classified: Secondary | ICD-10-CM

## 2020-07-03 NOTE — Therapy (Signed)
[place] MAIN [facility] SERVICES [address] [place], [place], [address] Phone: [phone]   Fax:  [phone]  Physical Therapy Treatment  Patient Details  Name: Ryan Bush MRN: [id number] Date of Birth: 1976-03-17 Referring Provider (PT): Clarise Cruz   Encounter Date: 07/03/2020   PT End of Session - 07/03/20 0955    Visit Number 16    Number of Visits 28    Date for PT Re-Evaluation 08/14/20    Authorization Type eval: 01/03    PT Start Time 0911    PT Stop Time 0945    PT Time Calculation (min) 34 min    Equipment Utilized During Treatment Gait belt    Activity Tolerance Patient tolerated treatment well;Patient limited by fatigue    Behavior During Therapy [facility] for tasks assessed/performed           Past Medical History:  Diagnosis Date  . HIV (human immunodeficiency virus infection) ([place])     History reviewed. No pertinent surgical history.  There were no vitals filed for this visit.   Subjective Assessment - 07/03/20 0954    Subjective Patient is late to physical therapy session. Reports having a good weekend, his daughter took him out for [place] food.    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 Patient had PT and OT in the hospital for 3 months. He was DC from PT due to his insurance running out and he needed a different kind of PT. He was discharged home and he had HHPT 2 x week for 1 month. He was dischaged from [place] and was supposed to get out patient PT but his insurance was cancelled. He has a new insurance now and has [place].    Limitations Standing;Walking    How long can you sit comfortably? unlimited    How long can you stand comfortably? 1-3 mins with BUE support    How long can you walk comfortably? unable    Patient Stated Goals to walk sideways at his house    Currently in Pain? No/denies                 Interpreter Jacqui present  TREATMENT   Manual Therapy: Bilateral hamstring stretch x 2 30s holds Bilateral heel cord stretch x 2 30s holds Bilateral hip abduction stretch x 2 30s holds Single knee to chest stretch x 2 30s holds Prone hip flexor lengthening 3x20 second holds  There Ex: Supine: Bridge 10x Hip flexion LLE AAROM 10x, RLE AROM 10x Prone hamstring curl AAROM 10x each LE Seated foot press/hamstring isometric into bosu ball (round side up) 10x 5 second holds each LE  Seated df/pf on prostretch 20x   Standing in // bars: BUE support requiring maxA to maintain standing position x 3 trials:- knee blocking each repetition  - PT knee blocking,  mod A- heavy bilateral UE use on parallel bars; requires cueing and stabilization to reduce excessive plantarflexion for improved weight acceptance through limbs.  -partial sit to stand for clearance of bottom from chair, heavy Stabilization to LE's and knees for alignment and muscle recruitment to increase LE recruitment for transfer rather than depending on UE's; 10x's 2 sets       Patient performed with instruction, verbal cues, tactile cues of therapist: goal: increase tissue extensibility, promote proper posture, improve mobility  PT Education - 07/03/20 0955    Education Details exercise technique, body mechanics    Person(s) Educated Patient    Methods Explanation;Demonstration;Tactile cues;Verbal cues    Comprehension Verbalized understanding;Returned demonstration;Verbal cues required;Tactile cues required            PT Short Term Goals - 06/12/20 0948      PT SHORT TERM GOAL #1   Title Patient will be independent in home exercise program to improve strength/mobility for better functional independence with ADLs.    Baseline 06/12/20  pt performing HEP 1x/day.    Time 4    Period Weeks    Status Partially Met    Target Date 07/10/20      PT SHORT TERM GOAL  #2   Title Patient will be able to stand in parallel bars with support of BUE for 30 seconds with adequate weight bearing through BLE.    Baseline 01/03: less than 30s, 06/12/20  pt able to stand in parallel bars 3 mins with min to mod A cueing for posture and knee blocking.    Time 4    Period Weeks    Status Achieved    Target Date 07/10/20             PT Long Term Goals - 06/19/20 1001      PT LONG TERM GOAL #1   Title Patient will be able to stand in parallel bars with support of BUE for 30-60 seconds with adequate weight bearing through BLE.    Baseline 01/03: less than 30s, 06/12/20  pt able to stand in parallel bars 3 mins with min to mod A cueing for posture and knee blocking.    Time 8    Period Weeks    Status Partially Met    Target Date 08/14/20      PT LONG TERM GOAL #2   Title Patient will increase BLE gross strength half a grade as to improve functional strength for increased standing tolerance and increased ADL ability.    Baseline 06/12/20 requires max assist for most movements impacting left LE, right min to mod assist depending on muscle group.    Time 8    Period Weeks    Status Partially Met    Target Date 08/14/20      PT LONG TERM GOAL #3   Title Patient will reduce joint contracture in BLE ankle plantarflexion by being able to achieve ankle dorsiflexion to neutral for better functional mobility    Baseline 01/03: RLE patient actively moves it by 4 degrees, unable to actively move LLE into DF, 06/12/20  arom into right DF 6 degrees, left unable, trace contraction dorsal foot with DF attempts on left.    Time 8    Period Weeks    Status Partially Met    Target Date 08/14/20                 Plan - 07/03/20 0956    Clinical Impression Statement Patient's session is limited by late arrival. He remains highly motivated throughout session but continues to be challenged in standing.with preference for plantarflexion requiring max A for correction. The  patient continues to benefit from additional skilled PT services to improve LE strength and ability to weight bear for improved functional capacity.    Personal Factors and Comorbidities Comorbidity 3+;Time since onset of injury/illness/exacerbation    Comorbidities HIV, Urinary retention with bladder stretch injurt, CNS toxoplasmosis (CMS-HCC), Paraplegia, incomplete, diabetic    Examination-Activity Limitations  Stand;Locomotion Level    Examination-Participation Restrictions Driving    Stability/Clinical Decision Making Evolving/Moderate complexity    Rehab Potential Fair    PT Frequency 2x / week    PT Duration 8 weeks    PT Treatment/Interventions ADLs/Self Care Home Management;Electrical Stimulation;Moist Heat;Gait training;Stair training;Functional mobility training;Therapeutic activities;Therapeutic exercise;Balance training;Neuromuscular re-education;Wheelchair mobility training;Manual techniques;Passive range of motion;Energy conservation    PT Next Visit Plan Continue with progressive LE strengthening in supine/seated position and continue with Standing balance/endurance in Parallel bars.    PT Home Exercise Plan Added heel cord stretching    Consulted and Agree with Plan of Care Patient           Patient will benefit from skilled therapeutic intervention in order to improve the following deficits and impairments:  Decreased range of motion,Decreased strength,Impaired flexibility,Difficulty walking,Decreased mobility,Decreased activity tolerance,Decreased endurance,Impaired sensation,Decreased balance,Impaired tone  Visit Diagnosis: Muscle weakness (generalized)  Difficulty in walking, not elsewhere classified  Unsteadiness on feet     Problem List There are no problems to display for this patient.  Janna Arch, PT, DPT    07/03/2020, 9:57 AM  [place] MAIN [facility] SERVICES [address] [place], [place], [address] Phone:  [phone]   Fax:  [phone]  Name: Ryan Bush MRN: [id number] Date of Birth: 05-29-1975

## 2020-07-05 ENCOUNTER — Ambulatory Visit: Payer: 59

## 2020-07-10 ENCOUNTER — Ambulatory Visit: Payer: 59

## 2020-07-10 ENCOUNTER — Other Ambulatory Visit: Payer: Self-pay

## 2020-07-10 DIAGNOSIS — R262 Difficulty in walking, not elsewhere classified: Secondary | ICD-10-CM | POA: Diagnosis not present

## 2020-07-10 DIAGNOSIS — G8222 Paraplegia, incomplete: Secondary | ICD-10-CM

## 2020-07-10 DIAGNOSIS — M6281 Muscle weakness (generalized): Secondary | ICD-10-CM | POA: Diagnosis not present

## 2020-07-10 DIAGNOSIS — R2681 Unsteadiness on feet: Secondary | ICD-10-CM

## 2020-07-10 NOTE — Therapy (Signed)
[place] MAIN [facility] SERVICES [address] [place], [place], [address] Phone: [phone]   Fax:  [phone]  Physical Therapy Treatment  Patient Details  Name: Ryan Bush MRN: [id number] Date of Birth: [birth year] Referring Provider (PT): Clarise Cruz   Encounter Date: 07/10/2020   PT End of Session - 07/10/20 1001    Visit Number 17    Number of Visits 28    Date for PT Re-Evaluation 08/14/20    Authorization Type [employer]    Authorization Time Period 06/19/20-08/14/20    PT Start Time 0905   Pt running late   PT Stop Time 0945    PT Time Calculation (min) 40 min    Equipment Utilized During Treatment Gait belt    Activity Tolerance Patient tolerated treatment well;Patient limited by fatigue    Behavior During Therapy WFL for tasks assessed/performed           Past Medical History:  Diagnosis Date  . HIV (human immunodeficiency virus infection) ([place])     No past surgical history on file.  There were no vitals filed for this visit.   Subjective Assessment - 07/10/20 1000    Subjective Pt doing well today overall, good weekend. Reports exercises going well at home. No updates to medical status, MD appointments, medications.    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 Patient had PT and OT in the hospital for 3 months. He was DC from PT due to his insurance running out and he needed a different kind of PT. He was discharged home and he had HHPT 2 x week for 1 month. He was dischaged from [place] and was supposed to get out patient PT but his insurance was cancelled. He has a new insurance now and has [place].    Currently in Pain? No/denies           INTERVENTION THIS DATE:  ModI lateral scoot transfer WC exit right to treatment table ModI sitting EOB to supine   Supine P/ROM static stretching (requires  total Assist)   LLE  -Hamstrings 2x30sec -FABER 2x30sec -Ankle DF 4x30sec (more difficult with knee extended)  *Quads, hip extensors, short adductors- screened and all WNL   RLE:  -hamstrings, ankle DF, long adductors, hip extensors- 2x30sec each  *FABER, quads WNL  Author assists with hooklying positioning  Hooklying: -Bridge 1x10 (no lift achieved, mostly pelvic rotation)  -RLE hip/knee flexion AA/ROM 2x10 -Hip knee/extension AR/ROM 2x10 (only tolerates mild resistance at top due to gluteal weakness, then bottom half stronger due to quadriceps strength)   ModI supine to sitting edge of table  -seated bilat reach toward floor (too low for adequate core control), then to blue ball, then basketball (both too low), then to vertical 4" step at varrying degrees of forward trunk flexion Trunk flexion x8, cues to maintain excursion within range of core control without UE assit -Single UE reach to ankle c CL UE in horizontal abduction as cantilever 1x5 bilat alternating sides (minGuardAssist)  -short sitting edge of table sternal basketball trunk rotation x10 (alternating sides) minGuard assist    PT Short Term Goals - 06/12/20 0948      PT SHORT TERM GOAL #1   Title Patient will be independent in home exercise program to improve strength/mobility for better functional independence with ADLs.    Baseline 06/12/20  pt performing HEP 1x/day.  Time 4    Period Weeks    Status Partially Met    Target Date 07/10/20      PT SHORT TERM GOAL #2   Title Patient will be able to stand in parallel bars with support of BUE for 30 seconds with adequate weight bearing through BLE.    Baseline 01/03: less than 30s, 06/12/20  pt able to stand in parallel bars 3 mins with min to mod A cueing for posture and knee blocking.    Time 4    Period Weeks    Status Achieved    Target Date 07/10/20             PT Long Term Goals - 06/19/20 1001      PT LONG TERM GOAL #1   Title Patient will be  able to stand in parallel bars with support of BUE for 30-60 seconds with adequate weight bearing through BLE.    Baseline 01/03: less than 30s, 06/12/20  pt able to stand in parallel bars 3 mins with min to mod A cueing for posture and knee blocking.    Time 8    Period Weeks    Status Partially Met    Target Date 08/14/20      PT LONG TERM GOAL #2   Title Patient will increase BLE gross strength half a grade as to improve functional strength for increased standing tolerance and increased ADL ability.    Baseline 06/12/20 requires max assist for most movements impacting left LE, right min to mod assist depending on muscle group.    Time 8    Period Weeks    Status Partially Met    Target Date 08/14/20      PT LONG TERM GOAL #3   Title Patient will reduce joint contracture in BLE ankle plantarflexion by being able to achieve ankle dorsiflexion to neutral for better functional mobility    Baseline 01/03: RLE patient actively moves it by 4 degrees, unable to actively move LLE into DF, 06/12/20  arom into right DF 6 degrees, left unable, trace contraction dorsal foot with DF attempts on left.    Time 8    Period Weeks    Status Partially Met    Target Date 08/14/20                 Plan - 07/10/20 1005    Clinical Impression Statement Continued with stretching to address spasticity and facilitate active motor control patterns in session. Open chain strengthening of BLE (remain very weak), then closed chain strengthening of core (abdominals and hip) to improve seated motor control and balance with functional reach away from COM. Pt fatigued at EOS, remains very motivated and in good spirits. Rest breaks given as needed. Pt continues to make progress overall toward treatment goals.    Personal Factors and Comorbidities Comorbidity 3+;Time since onset of injury/illness/exacerbation    Comorbidities HIV, Urinary retention with bladder stretch injurt, CNS toxoplasmosis (CMS-HCC), Paraplegia,  incomplete, diabetic    Examination-Activity Limitations Bathing    Examination-Participation Restrictions Driving    Stability/Clinical Decision Making Evolving/Moderate complexity    Clinical Decision Making Moderate    Rehab Potential Fair    PT Frequency 2x / week    PT Duration 8 weeks    PT Treatment/Interventions ADLs/Self Care Home Management;Electrical Stimulation;Moist Heat;Gait training;Stair training;Functional mobility training;Therapeutic activities;Therapeutic exercise;Balance training;Neuromuscular re-education;Wheelchair mobility training;Manual techniques;Passive range of motion;Energy conservation    PT Next Visit Plan Continue with progressive LE strengthening  in supine/seated position and continue with Standing balance/endurance in Parallel bars.    PT Home Exercise Plan No updates this session;    Consulted and Agree with Plan of Care Patient           Patient will benefit from skilled therapeutic intervention in order to improve the following deficits and impairments:  Decreased range of motion,Decreased strength,Impaired flexibility,Difficulty walking,Decreased mobility,Decreased activity tolerance,Decreased endurance,Impaired sensation,Decreased balance,Impaired tone  Visit Diagnosis: Muscle weakness (generalized)  Difficulty in walking, not elsewhere classified  Unsteadiness on feet  Paraplegia, incomplete ([place])     Problem List There are no problems to display for this patient.  10:19 AM, 07/10/20 Etta Grandchild, PT, DPT Physical Therapist - [facility]  Outpatient Physical Therapy- [place] [phone]     Etta Grandchild 07/10/2020, 10:08 AM  [place] MAIN [facility] SERVICES [address] [place], [place], [address] Phone: [phone]   Fax:  [phone]  Name: Ryan Bush MRN: [id number] Date of Birth: 26-Mar-1976

## 2020-07-12 ENCOUNTER — Ambulatory Visit: Payer: 59

## 2020-07-12 DIAGNOSIS — M6281 Muscle weakness (generalized): Secondary | ICD-10-CM

## 2020-07-12 DIAGNOSIS — R2681 Unsteadiness on feet: Secondary | ICD-10-CM

## 2020-07-12 DIAGNOSIS — G8222 Paraplegia, incomplete: Secondary | ICD-10-CM

## 2020-07-12 DIAGNOSIS — R262 Difficulty in walking, not elsewhere classified: Secondary | ICD-10-CM | POA: Diagnosis not present

## 2020-07-12 NOTE — Therapy (Signed)
[place] MAIN [facility] SERVICES [address] [place], [place], [address] Phone: [phone]   Fax:  [phone]  Physical Therapy Treatment  Patient Details  Name: Ryan Bush MRN: [id number] Date of Birth: 06/21/1975 Referring Provider (PT): Clarise Cruz   Encounter Date: 07/12/2020   PT End of Session - 07/12/20 1003    Visit Number 18    Number of Visits 28    Date for PT Re-Evaluation 08/14/20    Authorization Type [employer]    Authorization Time Period 06/19/20-08/14/20    PT Start Time 0910   pt running late today   PT Stop Time 0950    PT Time Calculation (min) 40 min    Activity Tolerance Patient tolerated treatment well;Patient limited by fatigue;No increased pain    Behavior During Therapy WFL for tasks assessed/performed           Past Medical History:  Diagnosis Date  . HIV (human immunodeficiency virus infection) ([place])     History reviewed. No pertinent surgical history.  There were no vitals filed for this visit.   Subjective Assessment - 07/12/20 0959    Subjective Pt doing well today. Reports feeling relaxed on Monday after last PT session (regarding the spastic tension in his legs). His wife conitnues to assist with stretching daily. She said she could notice an improvement in mobility of the Right leg especially. Pt reports he has a basic routine for upper body exercises at home using some free weights and bands, but is interested in learning a more advanced workout program that he could use at the gym at some point in the future.    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 Patient had PT and OT in the hospital for 3 months. He was DC from PT due to his insurance running out and he needed a different kind of PT. He was discharged home and he had HHPT 2 x week for 1 month. He was dischaged from  [place] and was supposed to get out patient PT but his insurance was cancelled. He has a new insurance now and has [place].    Currently in Pain? No/denies          INTERVENTION THIS DATE:  -enters in lightweight WC, self propelled -ModI lateral scoot transfers WC to mat, short sitting to supine -LLE Stretching in supine, P/ROM to tolerance, 2x30 sec each Hamstrings (bent knee), FABER, ankle DF (straight knee), long adductors (o degrees hip flexion)  -LLE Stretching in supine, P/ROM to tolerance, 2x30 sec each Hamstrings, SKTC (hip extensors), ankle DF (straight knee)  Hooklying AA/ROM, AR/ROM exercises RLE (knees on bolster) -SAQ 2x10 (very difficult isolation of knee extension without hip flexion -Combined Hip knee flexion 2x20 min-modA requires, gets more forceful with repetition)  -Combined Hip/knee extension 2x20, min resistance manually at met heads, gets more forceful with repetition)   Supine to Long sitting, minA, heavy effort, fatigue remaining in this position  -Long sitting tricep pushups using hands on dumbells 1x12  rest break trunk supported by Pryor Curia x90sec  -long sitting lateral scoots from 6:00 to 8:00 and back x3  Long sitting to short sitting EOB, short sitting EOB to WC  -Cable Rows 17.5lb x12 (in [facility], cues for scap retraction)    PT Short Term Goals - 06/12/20 0948      PT SHORT TERM GOAL #1  Title Patient will be independent in home exercise program to improve strength/mobility for better functional independence with ADLs.    Baseline 06/12/20  pt performing HEP 1x/day.    Time 4    Period Weeks    Status Partially Met    Target Date 07/10/20      PT SHORT TERM GOAL #2   Title Patient will be able to stand in parallel bars with support of BUE for 30 seconds with adequate weight bearing through BLE.    Baseline 01/03: less than 30s, 06/12/20  pt able to stand in parallel bars 3 mins with min to mod A cueing for posture and knee blocking.    Time 4    Period  Weeks    Status Achieved    Target Date 07/10/20             PT Long Term Goals - 06/19/20 1001      PT LONG TERM GOAL #1   Title Patient will be able to stand in parallel bars with support of BUE for 30-60 seconds with adequate weight bearing through BLE.    Baseline 01/03: less than 30s, 06/12/20  pt able to stand in parallel bars 3 mins with min to mod A cueing for posture and knee blocking.    Time 8    Period Weeks    Status Partially Met    Target Date 08/14/20      PT LONG TERM GOAL #2   Title Patient will increase BLE gross strength half a grade as to improve functional strength for increased standing tolerance and increased ADL ability.    Baseline 06/12/20 requires max assist for most movements impacting left LE, right min to mod assist depending on muscle group.    Time 8    Period Weeks    Status Partially Met    Target Date 08/14/20      PT LONG TERM GOAL #3   Title Patient will reduce joint contracture in BLE ankle plantarflexion by being able to achieve ankle dorsiflexion to neutral for better functional mobility    Baseline 01/03: RLE patient actively moves it by 4 degrees, unable to actively move LLE into DF, 06/12/20  arom into right DF 6 degrees, left unable, trace contraction dorsal foot with DF attempts on left.    Time 8    Period Weeks    Status Partially Met    Target Date 08/14/20                 Plan - 07/12/20 1003    Clinical Impression Statement Continued with stretching regimen to reduce impact of BLE spasticity on ADL ergonomics and ability to partake in open chain leg exercises. Added in some newer exercises for BUE for future carryover to long-term independence in transfers, WC mobility, and injury resistance given daily use of UE. Introduced 1 new cable machine exercise that would be easily performed with modified independence at the gym in the future.    Personal Factors and Comorbidities Comorbidity 3+;Time since onset of  injury/illness/exacerbation    Comorbidities HIV, Urinary retention with bladder stretch injurt, CNS toxoplasmosis (CMS-HCC), Paraplegia, incomplete, diabetic    Examination-Activity Limitations Bathing    Examination-Participation Restrictions Driving    Stability/Clinical Decision Making Evolving/Moderate complexity    Clinical Decision Making Moderate    Rehab Potential Fair    PT Frequency 2x / week    PT Duration 8 weeks    PT Treatment/Interventions ADLs/Self Care Home Management;Electrical  Stimulation;Moist Heat;Gait training;Stair training;Functional mobility training;Therapeutic activities;Therapeutic exercise;Balance training;Neuromuscular re-education;Wheelchair mobility training;Manual techniques;Passive range of motion;Energy conservation    PT Next Visit Plan Continue with progressive LE strengthening in supine/seated position and continue with Standing balance/endurance in Parallel bars.    PT Home Exercise Plan No updates this session;    Consulted and Agree with Plan of Care Patient           Patient will benefit from skilled therapeutic intervention in order to improve the following deficits and impairments:  Decreased range of motion,Decreased strength,Impaired flexibility,Difficulty walking,Decreased mobility,Decreased activity tolerance,Decreased endurance,Impaired sensation,Decreased balance,Impaired tone  Visit Diagnosis: Muscle weakness (generalized)  Difficulty in walking, not elsewhere classified  Unsteadiness on feet  Paraplegia, incomplete ([place])     Problem List There are no problems to display for this patient. 10:28 AM, 07/12/20 Etta Grandchild, PT, DPT Physical Therapist - [facility]  Outpatient Physical Therapy- [place] [phone]     Etta Grandchild 07/12/2020, 10:07 AM  [place] MAIN [facility] SERVICES [address] [place], [place], [address] Phone: [phone]    Fax:  [phone]  Name: Cass Vandermeulen MRN: [id number] Date of Birth: Aug 30, 1975

## 2020-07-17 ENCOUNTER — Other Ambulatory Visit: Payer: Self-pay

## 2020-07-17 ENCOUNTER — Ambulatory Visit: Payer: 59

## 2020-07-17 DIAGNOSIS — G8222 Paraplegia, incomplete: Secondary | ICD-10-CM

## 2020-07-17 DIAGNOSIS — R2681 Unsteadiness on feet: Secondary | ICD-10-CM

## 2020-07-17 DIAGNOSIS — M6281 Muscle weakness (generalized): Secondary | ICD-10-CM | POA: Diagnosis not present

## 2020-07-17 DIAGNOSIS — E119 Type 2 diabetes mellitus without complications: Secondary | ICD-10-CM | POA: Diagnosis not present

## 2020-07-17 DIAGNOSIS — R69 Illness, unspecified: Secondary | ICD-10-CM | POA: Diagnosis not present

## 2020-07-17 DIAGNOSIS — Z13220 Encounter for screening for lipoid disorders: Secondary | ICD-10-CM | POA: Diagnosis not present

## 2020-07-17 DIAGNOSIS — R262 Difficulty in walking, not elsewhere classified: Secondary | ICD-10-CM | POA: Diagnosis not present

## 2020-07-17 DIAGNOSIS — E785 Hyperlipidemia, unspecified: Secondary | ICD-10-CM | POA: Diagnosis not present

## 2020-07-17 NOTE — Therapy (Signed)
[place] MAIN [facility] SERVICES [address] [place], [place], [address] Phone: [phone]   Fax:  [phone]  Physical Therapy Treatment  Patient Details  Name: Ryan Bush MRN: [id number] Date of Birth: 1976-02-26 Referring Provider (PT): Clarise Cruz   Encounter Date: 07/17/2020   PT End of Session - 07/17/20 0907    Visit Number 19    Number of Visits 28    Date for PT Re-Evaluation 08/14/20    Authorization Type [employer]    Authorization Time Period 06/19/20-08/14/20    PT Start Time 0901    PT Stop Time 0945    PT Time Calculation (min) 44 min    Activity Tolerance Patient tolerated treatment well;Patient limited by fatigue;No increased pain    Behavior During Therapy WFL for tasks assessed/performed           Past Medical History:  Diagnosis Date  . HIV (human immunodeficiency virus infection) ([place])     History reviewed. No pertinent surgical history.  There were no vitals filed for this visit.   Subjective Assessment - 07/17/20 0905    Subjective Patient reports no pain, Had a good weekend. No falls reported. Compliant with HEP at home.    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 Patient had PT and OT in the hospital for 3 months. He was DC from PT due to his insurance running out and he needed a different kind of PT. He was discharged home and he had HHPT 2 x week for 1 month. He was dischaged from [place] and was supposed to get out patient PT but his insurance was cancelled. He has a new insurance now and has [place].    Currently in Pain? No/denies            Treatment:  Interpreter: [name] present  Supine: bolster under legs: LE stretching/muscle tissue lengthening -hamstring bent knee with leg on PT shoulder 60 seconds x2 trials each LE; deep pressure applied for tension/tone reduction each  LE. ; reports LLE more affected than RLE - posterior calf stretch with progressive DF 60 second holds x2 trials each LE.  -single knee to chest with progressive hold 2x 60 each LE -piriformis stretch, stabilization to limb provided with progressive lengthening 2x60 second holds   Prone positioning:  -hip flexor stretching, 60 second holds with PT overpressure for optimal lengthening. x2 trials each LE ; leg on PT knee  -hamstring curls, eccentric focus with AAROM  And tactile cueing to hamstring for muscle activation 10x each LE.   Standing in // bars:  -partial sit to stand 10x stabilization to knees provided with cues for weightbearing through LE's -standing with x2 assist, one person stabilizing L knee and foot, one person stabilizing R knee and foot, close CGA: static stand 45 seconds x3 trials   -lateral weight shift 30 seconds x3 trials  -anterior/posterior weight shift 30 seconds; x 3 trials ; cues for gluteal squeeze and muscle activation      Patient performed with instruction, verbal cues, tactile cues of therapist: goal: increase tissue extensibility, promote proper posture, improve mobility     Patient remains highly motivated throughout physical therpay session. He continues to require extensive soft tissue lengthening interventions due to tone and compensatory body mechanics with effort. The patient continues to benefit from additional skilled PT services to improve LE strength and ability to  weight bear for improved functional capacity.           PT Education - 07/17/20 0907    Education Details exercise technique, body mechanics    Person(s) Educated Patient    Methods Explanation;Demonstration;Tactile cues;Verbal cues    Comprehension Verbalized understanding;Returned demonstration;Verbal cues required;Tactile cues required            PT Short Term Goals - 06/12/20 0948      PT SHORT TERM GOAL #1   Title Patient will be independent in home exercise program  to improve strength/mobility for better functional independence with ADLs.    Baseline 06/12/20  pt performing HEP 1x/day.    Time 4    Period Weeks    Status Partially Met    Target Date 07/10/20      PT SHORT TERM GOAL #2   Title Patient will be able to stand in parallel bars with support of BUE for 30 seconds with adequate weight bearing through BLE.    Baseline 01/03: less than 30s, 06/12/20  pt able to stand in parallel bars 3 mins with min to mod A cueing for posture and knee blocking.    Time 4    Period Weeks    Status Achieved    Target Date 07/10/20             PT Long Term Goals - 06/19/20 1001      PT LONG TERM GOAL #1   Title Patient will be able to stand in parallel bars with support of BUE for 30-60 seconds with adequate weight bearing through BLE.    Baseline 01/03: less than 30s, 06/12/20  pt able to stand in parallel bars 3 mins with min to mod A cueing for posture and knee blocking.    Time 8    Period Weeks    Status Partially Met    Target Date 08/14/20      PT LONG TERM GOAL #2   Title Patient will increase BLE gross strength half a grade as to improve functional strength for increased standing tolerance and increased ADL ability.    Baseline 06/12/20 requires max assist for most movements impacting left LE, right min to mod assist depending on muscle group.    Time 8    Period Weeks    Status Partially Met    Target Date 08/14/20      PT LONG TERM GOAL #3   Title Patient will reduce joint contracture in BLE ankle plantarflexion by being able to achieve ankle dorsiflexion to neutral for better functional mobility    Baseline 01/03: RLE patient actively moves it by 4 degrees, unable to actively move LLE into DF, 06/12/20  arom into right DF 6 degrees, left unable, trace contraction dorsal foot with DF attempts on left.    Time 8    Period Weeks    Status Partially Met    Target Date 08/14/20                 Plan - 07/17/20 5038    Clinical  Impression Statement Patient remains highly motivated throughout physical therpay session. He continues to require extensive soft tissue lengthening interventions due to tone and compensatory body mechanics with effort. The patient continues to benefit from additional skilled PT services to improve LE strength and ability to weight bear for improved functional capacity.    Personal Factors and Comorbidities Comorbidity 3+;Time since onset of injury/illness/exacerbation    Comorbidities HIV, Urinary retention with  bladder stretch injurt, CNS toxoplasmosis (CMS-HCC), Paraplegia, incomplete, diabetic    Examination-Activity Limitations Bathing    Examination-Participation Restrictions Driving    Stability/Clinical Decision Making Evolving/Moderate complexity    Rehab Potential Fair    PT Frequency 2x / week    PT Duration 8 weeks    PT Treatment/Interventions ADLs/Self Care Home Management;Electrical Stimulation;Moist Heat;Gait training;Stair training;Functional mobility training;Therapeutic activities;Therapeutic exercise;Balance training;Neuromuscular re-education;Wheelchair mobility training;Manual techniques;Passive range of motion;Energy conservation    PT Next Visit Plan Continue with progressive LE strengthening in supine/seated position and continue with Standing balance/endurance in Parallel bars.    PT Home Exercise Plan No updates this session;    Consulted and Agree with Plan of Care Patient           Patient will benefit from skilled therapeutic intervention in order to improve the following deficits and impairments:  Decreased range of motion,Decreased strength,Impaired flexibility,Difficulty walking,Decreased mobility,Decreased activity tolerance,Decreased endurance,Impaired sensation,Decreased balance,Impaired tone  Visit Diagnosis: Muscle weakness (generalized)  Difficulty in walking, not elsewhere classified  Unsteadiness on feet  Paraplegia, incomplete  ([place])     Problem List There are no problems to display for this patient.  Janna Arch, PT, DPT   07/17/2020, 9:44 AM  [place] MAIN [facility] SERVICES [address] [place], [place], [address] Phone: [phone]   Fax:  [phone]  Name: Ryan Bush MRN: [id number] Date of Birth: 10/25/1975

## 2020-07-19 ENCOUNTER — Other Ambulatory Visit: Payer: Self-pay

## 2020-07-19 ENCOUNTER — Ambulatory Visit: Payer: 59

## 2020-07-19 DIAGNOSIS — R2681 Unsteadiness on feet: Secondary | ICD-10-CM

## 2020-07-19 DIAGNOSIS — G8222 Paraplegia, incomplete: Secondary | ICD-10-CM | POA: Diagnosis not present

## 2020-07-19 DIAGNOSIS — R262 Difficulty in walking, not elsewhere classified: Secondary | ICD-10-CM

## 2020-07-19 DIAGNOSIS — M6281 Muscle weakness (generalized): Secondary | ICD-10-CM | POA: Diagnosis not present

## 2020-07-19 NOTE — Therapy (Signed)
[place] MAIN [facility] SERVICES [address] [place], [place], [address] Phone: [phone]   Fax:  [phone]  Physical Therapy Progress Note   Dates of reporting period  06/12/20   to   07/19/20   Patient Details  Name: Ryan Bush MRN: [id number] Date of Birth: 1975/11/29 Referring Provider (PT): Clarise Cruz   Encounter Date: 07/19/2020   PT End of Session - 07/19/20 0942    Visit Number 20    Number of Visits 28    Date for PT Re-Evaluation 08/14/20    Authorization Type [employer]    Authorization Time Period 06/19/20-08/14/20    PT Start Time 0907    PT Stop Time 0945    PT Time Calculation (min) 38 min    Equipment Utilized During Treatment Gait belt    Activity Tolerance Patient tolerated treatment well;Patient limited by fatigue;No increased pain    Behavior During Therapy WFL for tasks assessed/performed           Past Medical History:  Diagnosis Date  . HIV (human immunodeficiency virus infection) ([place])     History reviewed. No pertinent surgical history.  There were no vitals filed for this visit.   Subjective Assessment - 07/19/20 0941    Subjective Pt reports doing well today without pain. No falls since last session. Mild fatigue post session that subsided quickly with time.    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 Patient had PT and OT in the hospital for 3 months. He was DC from PT due to his insurance running out and he needed a different kind of PT. He was discharged home and he had HHPT 2 x week for 1 month. He was dischaged from [facility] and was supposed to get out patient PT but his insurance was cancelled. He has a new insurance now and has [place].    Limitations Standing;Walking    How long can you sit comfortably? unlimited    How long can you stand comfortably? 1-3 mins with BUE  support    How long can you walk comfortably? unable    Currently in Pain? No/denies             Treatment:  Interpreter: Christia Reading present   Supine: LE stretching/muscle tissue lengthening -hamstring bent knee with leg on PT shoulder 60 seconds x2 trials each LE; deep pressure applied for tension/tone reduction each LE with contract/relax for 10sec x2 - posterior calf stretch with progressive DF 60 second holds x2 trials each LE with contract/relax for 10s x2   Prone positioning:  -BLE hip flexor stretching, 60 second holds x2 with PT overpressure for optimal lengthening with contract/relax for 10s x2. leg on PT knee   Standing in // bars:  - standing with +2 assist, with blocking bil knees and feet. TC for quad activation. Able to maintain for 60s with BUE support. Mod assist +2 progressing to max assist +2 with fatigue.   - standing with +2 mod assist, with blocking bil knees and feet. 45sec. Lateral weight shift with focus on glute and quad activation with weight acceptance.     Patient performed with instruction, verbal cues, tactile cues of therapist: goal: increase tissue extensibility, promote proper posture, improve mobility   Assessment: Pt seen today for 10th visit progress note. While pt is making progress towards goals, he continues to be limited with  meeting goals and overall independence secondary to generalized LE weakness (L>R), increased tone limiting general ROM, decreased activity tolerance, and impaired standing balance. Pt able to improve R hip flexion and knee extension strength to 3-/5, but otherwise has made little improvement with LLE strength. Pt continues to require increased assistance with standing activities for a limited duration. Pt able to stand for 60s in // bars with mod assist +2 - max assist +2 and increased VC/TC for glute and quad activation secondary to bil knee buckling (L>R). Improved L ankle PROM noted s/p MT with contract/relax bias and deep  pressure to dampen tone. Decreased activity tolerance noted by RPE of 4-8/10 indicating "moderate-vigorous activity" with standing in // bars. Deficits limit the pt's ability to safely and independently stand and walk, and increase his risk of falls. Patient's condition has the potential to improve in response to therapy. Maximum improvement is yet to be obtained. The anticipated improvement is attainable and reasonable in a generally predictable time.  Will continue per POC.      PT Education - 07/19/20 [phone]    Education Details progress, deficits, body mechanics    Person(s) Educated Patient    Methods Explanation;Demonstration;Tactile cues;Verbal cues    Comprehension Verbalized understanding;Returned demonstration;Verbal cues required;Tactile cues required            PT Short Term Goals - 07/19/20 0946      PT SHORT TERM GOAL #1   Title Patient will be independent in home exercise program to improve strength/mobility for better functional independence with ADLs.    Baseline 06/12/20  pt performing HEP 1x/day. 3/30: performing 1x/day, interested in gym membership    Time 4    Period Weeks    Status Partially Met    Target Date 07/10/20      PT SHORT TERM GOAL #2   Title Patient will be able to stand in parallel bars with support of BUE for 30 seconds with adequate weight bearing through BLE.    Baseline 01/03: less than 30s, 06/12/20  pt able to stand in parallel bars 3 mins with min to mod A cueing for posture and knee blocking.    Time 4    Period Weeks    Status Achieved    Target Date 07/10/20             PT Long Term Goals - 07/19/20 0948      PT LONG TERM GOAL #1   Title Patient will be able to stand in parallel bars with support of BUE for 30-60 seconds with adequate weight bearing through BLE.    Baseline 01/03: less than 30s, 06/12/20  pt able to stand in parallel bars 3 mins with min to mod A cueing for posture and knee blocking. 3/30: 60s with mod-max A +2 with  blocking bil feet/knees and TC for quad mm activation    Time 8    Period Weeks    Status Partially Met      PT LONG TERM GOAL #2   Title Patient will increase BLE gross strength half a grade as to improve functional strength for increased standing tolerance and increased ADL ability.    Baseline 06/12/20 requires max assist for most movements impacting left LE, right min to mod assist depending on muscle group. 3/30: R hip flex, knee ext 3-/5, requires max A for LLE mvmt    Time 8    Period Weeks    Status Partially Met  PT LONG TERM GOAL #3   Title Patient will reduce joint contracture in BLE ankle plantarflexion by being able to achieve ankle dorsiflexion to neutral for better functional mobility    Baseline 01/03: RLE patient actively moves it by 4 degrees, unable to actively move LLE into DF, 06/12/20  arom into right DF 6 degrees, left unable, trace contraction dorsal foot with DF attempts on left. 3/30: continues to be limited with achieving neutral, but demo improvements with PROM s/p MT on LLE (~3deg improvement), lacking ~15deg DF on RLE.    Time 8    Period Weeks    Status Partially Met                 Plan - 07/19/20 0946    Clinical Impression Statement Pt seen today for 10th visit progress note. While pt is making progress towards goals, he continues to be limited with meeting goals and overall independence secondary to generalized LE weakness (L>R), increased tone limiting general ROM, decreased activity tolerance, and impaired standing balance. Pt able to improve R hip flexion and knee extension strength to 3-/5, but otherwise has made little improvement with LLE strength. Pt continues to require increased assistance with standing activities for a limited duration. Pt able to stand for 60s in // bars with mod assist +2 - max assist +2 and increased VC/TC for glute and quad activation secondary to bil knee buckling (L>R). Improved L ankle PROM noted s/p MT with  contract/relax bias and deep pressure to dampen tone. Deficits limit the pt's ability to safely and independently stand and walk, and increase his risk of falls. Patient's condition has the potential to improve in response to therapy. Maximum improvement is yet to be obtained. The anticipated improvement is attainable and reasonable in a generally predictable time.  Will continue per POC.    Personal Factors and Comorbidities Comorbidity 3+;Time since onset of injury/illness/exacerbation    Comorbidities HIV, Urinary retention with bladder stretch injurt, CNS toxoplasmosis (CMS-HCC), Paraplegia, incomplete, diabetic    Examination-Activity Limitations Bathing    Examination-Participation Restrictions Driving    Stability/Clinical Decision Making Evolving/Moderate complexity    Rehab Potential Fair    PT Frequency 2x / week    PT Duration 8 weeks    PT Treatment/Interventions ADLs/Self Care Home Management;Electrical Stimulation;Moist Heat;Gait training;Stair training;Functional mobility training;Therapeutic activities;Therapeutic exercise;Balance training;Neuromuscular re-education;Wheelchair mobility training;Manual techniques;Passive range of motion;Energy conservation    PT Next Visit Plan Continue with progressive LE strengthening in supine/seated position and continue with Standing balance/endurance in Parallel bars.    PT Home Exercise Plan No updates this session;    Consulted and Agree with Plan of Care Patient           Patient will benefit from skilled therapeutic intervention in order to improve the following deficits and impairments:  Decreased range of motion,Decreased strength,Impaired flexibility,Difficulty walking,Decreased mobility,Decreased activity tolerance,Decreased endurance,Impaired sensation,Decreased balance,Impaired tone  Visit Diagnosis: Difficulty in walking, not elsewhere classified  Muscle weakness (generalized)  Unsteadiness on feet  Paraplegia, incomplete  ([place])     Problem List There are no problems to display for this patient.  Herminio Commons, PT, DPT 10:12 AM,07/19/20  [place] MAIN [facility] SERVICES [address] [place], [place], [address] Phone: [phone]   Fax:  [phone]  Name: Marc Leichter MRN: [id number] Date of Birth: 03-28-1976

## 2020-07-24 ENCOUNTER — Other Ambulatory Visit: Payer: Self-pay

## 2020-07-24 ENCOUNTER — Ambulatory Visit: Payer: 59 | Attending: Physical Medicine and Rehabilitation

## 2020-07-24 DIAGNOSIS — R2681 Unsteadiness on feet: Secondary | ICD-10-CM | POA: Diagnosis not present

## 2020-07-24 DIAGNOSIS — G8222 Paraplegia, incomplete: Secondary | ICD-10-CM

## 2020-07-24 DIAGNOSIS — R262 Difficulty in walking, not elsewhere classified: Secondary | ICD-10-CM | POA: Diagnosis not present

## 2020-07-24 DIAGNOSIS — M6281 Muscle weakness (generalized): Secondary | ICD-10-CM

## 2020-07-24 NOTE — Therapy (Signed)
[place] MAIN [facility] SERVICES [address] [place], [place], [address] Phone: [phone]   Fax:  [phone]  Physical Therapy Treatment  Patient Details  Name: Ryan Bush MRN: [id number] Date of Birth: 11-12-1975 Referring Provider (PT): Clarise Cruz   Encounter Date: 07/24/2020   PT End of Session - 07/24/20 0909    Visit Number 21    Number of Visits 28    Date for PT Re-Evaluation 08/14/20    Authorization Type [employer]    Authorization Time Period 06/19/20-08/14/20    PT Start Time 0901    PT Stop Time 0940    PT Time Calculation (min) 39 min    Equipment Utilized During Treatment Gait belt    Activity Tolerance Patient tolerated treatment well;Patient limited by fatigue;No increased pain    Behavior During Therapy WFL for tasks assessed/performed           Past Medical History:  Diagnosis Date  . HIV (human immunodeficiency virus infection) ([place])     History reviewed. No pertinent surgical history.  There were no vitals filed for this visit.   Subjective Assessment - 07/24/20 0903    Subjective Pt reports no new complaints upon arrival to clinic today.  No falls since the last session and he has been performing HEP consistently.    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 Patient had PT and OT in the hospital for 3 months. He was DC from PT due to his insurance running out and he needed a different kind of PT. He was discharged home and he had HHPT 2 x week for 1 month. He was dischaged from [facility] and was supposed to get out patient PT but his insurance was cancelled. He has a new insurance now and has [place].    Limitations Standing;Walking    How long can you sit comfortably? unlimited    How long can you stand comfortably? 1-3 mins with BUE support    How long can you walk comfortably? unable     Currently in Pain? No/denies    Multiple Pain Sites No              Treatment:  Interpreter: Interpreter not utilized during today's session.  Supine: -hamstring bent knee with leg on PT shoulder 60 seconds x2 trials each LE; deep pressure applied for tension/tone reduction each LE. ; reports LLE more affected than RLE - posterior calf stretch with progressive DF 60 second holds x2 trials each LE.  -single knee to chest with progressive hold 2x 60 each LE -piriformis stretch, stabilization to limb provided with progressive lengthening 2x60 second holds    Seated:  BOSU ball under LEs:  LE stretching/muscle tissue lengthening -seated forward flexed posture with extended LE's for hamstring lengthening, 2x30 sec bouts -pro-stretch utilized for dorsiflexion of BLEs, -AAROM LAQ's, x10 -pallof press with use of 2000g ball for core stabilization, x10 -straight arm ball raises with use of 2000g ball for core activation, x10   Standing in // bars:  -partial sit to stand 10x stabilization to knees provided with cues for weightbearing through LE's -standing with x2 assist, one person stabilizing L knee and foot, one person stabilizing R knee and foot, close CGA: static stand 45 seconds x3 trials              -lateral weight shift 30 seconds x3 trials             -  anterior/posterior weight shift 30 seconds; x 3 trials ; cues for gluteal squeeze and muscle activation     Patient performed with instruction, verbal cues, tactile cues of therapist: goal:increase tissue extensibility, promote proper posture, improve mobility              PT Education - 07/24/20 0909    Education Details exercise technique, body mechanics    Person(s) Educated Patient    Methods Explanation;Demonstration;Tactile cues;Verbal cues    Comprehension Verbalized understanding;Returned demonstration;Verbal cues required;Tactile cues required            PT Short Term Goals - 07/19/20 0946       PT SHORT TERM GOAL #1   Title Patient will be independent in home exercise program to improve strength/mobility for better functional independence with ADLs.    Baseline 06/12/20  pt performing HEP 1x/day. 3/30: performing 1x/day, interested in gym membership    Time 4    Period Weeks    Status Partially Met    Target Date 07/10/20      PT SHORT TERM GOAL #2   Title Patient will be able to stand in parallel bars with support of BUE for 30 seconds with adequate weight bearing through BLE.    Baseline 01/03: less than 30s, 06/12/20  pt able to stand in parallel bars 3 mins with min to mod A cueing for posture and knee blocking.    Time 4    Period Weeks    Status Achieved    Target Date 07/10/20             PT Long Term Goals - 07/19/20 0948      PT LONG TERM GOAL #1   Title Patient will be able to stand in parallel bars with support of BUE for 30-60 seconds with adequate weight bearing through BLE.    Baseline 01/03: less than 30s, 06/12/20  pt able to stand in parallel bars 3 mins with min to mod A cueing for posture and knee blocking. 3/30: 60s with mod-max A +2 with blocking bil feet/knees and TC for quad mm activation    Time 8    Period Weeks    Status Partially Met      PT LONG TERM GOAL #2   Title Patient will increase BLE gross strength half a grade as to improve functional strength for increased standing tolerance and increased ADL ability.    Baseline 06/12/20 requires max assist for most movements impacting left LE, right min to mod assist depending on muscle group. 3/30: R hip flex, knee ext 3-/5, requires max A for LLE mvmt    Time 8    Period Weeks    Status Partially Met      PT LONG TERM GOAL #3   Title Patient will reduce joint contracture in BLE ankle plantarflexion by being able to achieve ankle dorsiflexion to neutral for better functional mobility    Baseline 01/03: RLE patient actively moves it by 4 degrees, unable to actively move LLE into DF, 06/12/20  arom  into right DF 6 degrees, left unable, trace contraction dorsal foot with DF attempts on left. 3/30: continues to be limited with achieving neutral, but demo improvements with PROM s/p MT on LLE (~3deg improvement), lacking ~15deg DF on RLE.    Time 8    Period Weeks    Status Partially Met                 Plan - 07/24/20  0943    Clinical Impression Statement Pt continues to perform well with therapy giving good effort throughout therapy session.  Pt continues to require stretching during the initial portion of session in order to decrease tone and increase muscle length.  During standing exercises pt was noted to have muscle trembling in the UEs with final bouts of assisted standing and partial stands due to fatigue.  Pt will continue to benefit from skilled therapy in order to address remaining goals/deficits still present at this time.    Personal Factors and Comorbidities Comorbidity 3+;Time since onset of injury/illness/exacerbation    Comorbidities HIV, Urinary retention with bladder stretch injurt, CNS toxoplasmosis (CMS-HCC), Paraplegia, incomplete, diabetic    Examination-Activity Limitations Bathing    Examination-Participation Restrictions Driving    Stability/Clinical Decision Making Evolving/Moderate complexity    Rehab Potential Fair    PT Frequency 2x / week    PT Duration 8 weeks    PT Treatment/Interventions ADLs/Self Care Home Management;Electrical Stimulation;Moist Heat;Gait training;Stair training;Functional mobility training;Therapeutic activities;Therapeutic exercise;Balance training;Neuromuscular re-education;Wheelchair mobility training;Manual techniques;Passive range of motion;Energy conservation    PT Next Visit Plan Continue with progressive LE strengthening in supine/seated position and continue with Standing balance/endurance in Parallel bars.    PT Home Exercise Plan No updates this session;    Consulted and Agree with Plan of Care Patient            Patient will benefit from skilled therapeutic intervention in order to improve the following deficits and impairments:  Decreased range of motion,Decreased strength,Impaired flexibility,Difficulty walking,Decreased mobility,Decreased activity tolerance,Decreased endurance,Impaired sensation,Decreased balance,Impaired tone  Visit Diagnosis: Difficulty in walking, not elsewhere classified  Muscle weakness (generalized)  Unsteadiness on feet  Paraplegia, incomplete ([place])     Problem List There are no problems to display for this patient.   Janna Arch, PT, DPT    07/24/2020, 9:50 AM  [place] MAIN [facility] SERVICES [address] [place], [place], [address] Phone: [phone]   Fax:  [phone]  Name: Sueo Cullen MRN: [id number] Date of Birth: 09-Apr-1976

## 2020-07-26 ENCOUNTER — Ambulatory Visit: Payer: 59

## 2020-07-26 ENCOUNTER — Other Ambulatory Visit: Payer: Self-pay

## 2020-07-26 DIAGNOSIS — R2681 Unsteadiness on feet: Secondary | ICD-10-CM | POA: Diagnosis not present

## 2020-07-26 DIAGNOSIS — M6281 Muscle weakness (generalized): Secondary | ICD-10-CM

## 2020-07-26 DIAGNOSIS — G8222 Paraplegia, incomplete: Secondary | ICD-10-CM | POA: Diagnosis not present

## 2020-07-26 DIAGNOSIS — R262 Difficulty in walking, not elsewhere classified: Secondary | ICD-10-CM | POA: Diagnosis not present

## 2020-07-26 NOTE — Therapy (Signed)
[place] MAIN [facility] SERVICES [address] [place], [place], [address] Phone: [phone]   Fax:  [phone]  Physical Therapy Treatment  Patient Details  Name: Ryan Bush MRN: [id number] Date of Birth: 10/20/1975 Referring Provider (PT): Clarise Cruz   Encounter Date: 07/26/2020   PT End of Session - 07/26/20 1253    Visit Number 22    Number of Visits 28    Date for PT Re-Evaluation 08/14/20    Authorization Type [employer]    Authorization Time Period 06/19/20-08/14/20    PT Start Time 0907    PT Stop Time 0945    PT Time Calculation (min) 38 min    Equipment Utilized During Treatment Gait belt    Activity Tolerance Patient tolerated treatment well;Patient limited by fatigue;No increased pain    Behavior During Therapy WFL for tasks assessed/performed           Past Medical History:  Diagnosis Date  . HIV (human immunodeficiency virus infection) ([place])     History reviewed. No pertinent surgical history.  There were no vitals filed for this visit.   Subjective Assessment - 07/26/20 0909    Subjective Patient reports no new falls or pains. Is late to session due to his ride.    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 Patient had PT and OT in the hospital for 3 months. He was DC from PT due to his insurance running out and he needed a different kind of PT. He was discharged home and he had HHPT 2 x week for 1 month. He was dischaged from [place] and was supposed to get out patient PT but his insurance was cancelled. He has a new insurance now and has [place].    Limitations Standing;Walking    How long can you sit comfortably? unlimited    How long can you stand comfortably? 1-3 mins with BUE support    How long can you walk comfortably? unable    Currently in Pain? No/denies                 Treatment:  Interpreter: Lloyda   Supine: -hamstring bent knee with leg on PT shoulder 60 seconds x1 trial each LE; deep pressure applied for tension/tone reduction each LE. ; reports LLE more affected than RLE - posterior calf stretch with progressive DF 60 second holds x1 trial each LE.  -single knee to chest with progressive hold x 60 each LE -piriformis stretch, stabilization to limb provided with progressive lengthening x60 second holds     Seated:  BOSU ball under LEs:  LE stretching/muscle tissue lengthening -seated forward flexed posture with extended LE's for hamstring lengthening, 2x30 sec bouts -weighted ball (3000 gr) trunk extension and upright posture 10x -green ball adduction 10x squeeze  -abduction isometric into PT hand 10x 5 second hold -straight arm ball raises with use of 2000g ball for core activation, x10   Sidelying: Hip extension with AAROM 10x each side Clamshell with stabilization 10x each side    Standing in // bars:  -partial sit to stand 10x stabilization to knees provided with cues for weightbearing through LE's; 3 sets       Patient performed with instruction, verbal cues, tactile cues of therapist: goal: increase tissue extensibility, promote proper posture, improve mobility  PT Education - 07/26/20 1253    Education Details exercise technique, body mechanics    Person(s) Educated Patient    Methods Explanation;Demonstration;Tactile cues;Verbal cues    Comprehension Verbalized understanding;Returned demonstration;Verbal cues required;Tactile cues required            PT Short Term Goals - 07/19/20 0946      PT SHORT TERM GOAL #1   Title Patient will be independent in home exercise program to improve strength/mobility for better functional independence with ADLs.    Baseline 06/12/20  pt performing HEP 1x/day. 3/30: performing 1x/day, interested in gym membership    Time 4    Period Weeks     Status Partially Met    Target Date 07/10/20      PT SHORT TERM GOAL #2   Title Patient will be able to stand in parallel bars with support of BUE for 30 seconds with adequate weight bearing through BLE.    Baseline 01/03: less than 30s, 06/12/20  pt able to stand in parallel bars 3 mins with min to mod A cueing for posture and knee blocking.    Time 4    Period Weeks    Status Achieved    Target Date 07/10/20             PT Long Term Goals - 07/19/20 0948      PT LONG TERM GOAL #1   Title Patient will be able to stand in parallel bars with support of BUE for 30-60 seconds with adequate weight bearing through BLE.    Baseline 01/03: less than 30s, 06/12/20  pt able to stand in parallel bars 3 mins with min to mod A cueing for posture and knee blocking. 3/30: 60s with mod-max A +2 with blocking bil feet/knees and TC for quad mm activation    Time 8    Period Weeks    Status Partially Met      PT LONG TERM GOAL #2   Title Patient will increase BLE gross strength half a grade as to improve functional strength for increased standing tolerance and increased ADL ability.    Baseline 06/12/20 requires max assist for most movements impacting left LE, right min to mod assist depending on muscle group. 3/30: R hip flex, knee ext 3-/5, requires max A for LLE mvmt    Time 8    Period Weeks    Status Partially Met      PT LONG TERM GOAL #3   Title Patient will reduce joint contracture in BLE ankle plantarflexion by being able to achieve ankle dorsiflexion to neutral for better functional mobility    Baseline 01/03: RLE patient actively moves it by 4 degrees, unable to actively move LLE into DF, 06/12/20  arom into right DF 6 degrees, left unable, trace contraction dorsal foot with DF attempts on left. 3/30: continues to be limited with achieving neutral, but demo improvements with PROM s/p MT on LLE (~3deg improvement), lacking ~15deg DF on RLE.    Time 8    Period Weeks    Status Partially Met                  Plan - 07/26/20 1255    Clinical Impression Statement Patient's session limited by late arrival.He continues to be highly motivated and has increased LLE activation compared to previous sessions indicating improved mobility. During standing exercises pt was noted to have muscle trembling in the UEs with final bouts of assisted standing and partial  stands due to fatigue. Pt will continue to benefit from skilled therapy in order to address remaining goals/deficits still present at this time    Personal Factors and Comorbidities Comorbidity 3+;Time since onset of injury/illness/exacerbation    Comorbidities HIV, Urinary retention with bladder stretch injurt, CNS toxoplasmosis (CMS-HCC), Paraplegia, incomplete, diabetic    Examination-Activity Limitations Bathing    Examination-Participation Restrictions Driving    Stability/Clinical Decision Making Evolving/Moderate complexity    Rehab Potential Fair    PT Frequency 2x / week    PT Duration 8 weeks    PT Treatment/Interventions ADLs/Self Care Home Management;Electrical Stimulation;Moist Heat;Gait training;Stair training;Functional mobility training;Therapeutic activities;Therapeutic exercise;Balance training;Neuromuscular re-education;Wheelchair mobility training;Manual techniques;Passive range of motion;Energy conservation    PT Next Visit Plan Continue with progressive LE strengthening in supine/seated position and continue with Standing balance/endurance in Parallel bars.    PT Home Exercise Plan No updates this session;    Consulted and Agree with Plan of Care Patient           Patient will benefit from skilled therapeutic intervention in order to improve the following deficits and impairments:  Decreased range of motion,Decreased strength,Impaired flexibility,Difficulty walking,Decreased mobility,Decreased activity tolerance,Decreased endurance,Impaired sensation,Decreased balance,Impaired tone  Visit  Diagnosis: Difficulty in walking, not elsewhere classified  Muscle weakness (generalized)  Unsteadiness on feet     Problem List There are no problems to display for this patient.  Janna Arch, PT, DPT   07/26/2020, 12:56 PM  [place] MAIN [facility] SERVICES [address] [place], [place], [address] Phone: [phone]   Fax:  [phone]  Name: Ryan Bush MRN: [id number] Date of Birth: 11/29/1975

## 2020-07-28 DIAGNOSIS — R339 Retention of urine, unspecified: Secondary | ICD-10-CM | POA: Diagnosis not present

## 2020-07-31 ENCOUNTER — Other Ambulatory Visit: Payer: Self-pay

## 2020-07-31 ENCOUNTER — Ambulatory Visit: Payer: 59

## 2020-07-31 DIAGNOSIS — M6281 Muscle weakness (generalized): Secondary | ICD-10-CM | POA: Diagnosis not present

## 2020-07-31 DIAGNOSIS — G8222 Paraplegia, incomplete: Secondary | ICD-10-CM | POA: Diagnosis not present

## 2020-07-31 DIAGNOSIS — R2681 Unsteadiness on feet: Secondary | ICD-10-CM

## 2020-07-31 DIAGNOSIS — R262 Difficulty in walking, not elsewhere classified: Secondary | ICD-10-CM | POA: Diagnosis not present

## 2020-07-31 NOTE — Therapy (Signed)
[place] MAIN [facility] SERVICES [address] [place], [place], [address] Phone: [phone]   Fax:  [phone]  Physical Therapy Treatment  Patient Details  Name: Ryan Bush MRN: [id number] Date of Birth: May 08, 1975 Referring Provider (PT): Clarise Cruz   Encounter Date: 07/31/2020   PT End of Session - 07/31/20 1014    Visit Number 23    Number of Visits 28    Date for PT Re-Evaluation 08/14/20    Authorization Type [employer]    Authorization Time Period 06/19/20-08/14/20    PT Start Time 0900    PT Stop Time 0944    PT Time Calculation (min) 44 min    Equipment Utilized During Treatment Gait belt    Activity Tolerance Patient tolerated treatment well;Patient limited by fatigue;No increased pain    Behavior During Therapy WFL for tasks assessed/performed           Past Medical History:  Diagnosis Date  . HIV (human immunodeficiency virus infection) ([place])     History reviewed. No pertinent surgical history.  There were no vitals filed for this visit.   Subjective Assessment - 07/31/20 1012    Subjective Patient reports compliance with HEP. no falls or LOB since last session.    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 Patient had PT and OT in the hospital for 3 months. He was DC from PT due to his insurance running out and he needed a different kind of PT. He was discharged home and he had HHPT 2 x week for 1 month. He was dischaged from [facility] and was supposed to get out patient PT but his insurance was cancelled. He has a new insurance now and has [place].    Limitations Standing;Walking    How long can you sit comfortably? unlimited    How long can you stand comfortably? 1-3 mins with BUE support    How long can you walk comfortably? unable    Currently in Pain? No/denies               Treatment:   Interpreter: Jaqui   Supine: -hamstring bent knee with leg on PT shoulder 60 seconds x1 trial each LE; deep pressure applied for tension/tone reduction each LE. ; reports LLE more affected than RLE - posterior calf stretch with progressive DF 60 second holds x1 trial each LE.  -single knee to chest with progressive hold x 60 each LE -piriformis stretch, stabilization to limb provided with progressive lengthening x60 second holds     Seated:  BOSU ball under LEs:  LE stretching/muscle tissue lengthening -seated forward flexed posture with extended LE's for hamstring lengthening, 2x30 sec bouts -abduction isometric YTB 10x 5 second hold; single limb at a time -adduction isometric hold against PT hand 10x 5 second hold  -AAROM quad contraction LAQ 10x each LE    Sidelying: Hip extension with AAROM 10x each side Clamshell with stabilization 10x each side    Standing in // bars:  -partial sit to stand 10x stabilization to knees provided with cues for weightbearing through LE's; 2 sets  standing with x2 assist, one person stabilizing L knee and foot, one person stabilizing R knee and foot, close CGA: static stand 45 seconds x3 trials   -lateral weight shift 30 seconds x2 trials       Patient performed with instruction, verbal cues, tactile cues  of therapist: goal: increase tissue extensibility, promote proper posture, improve mobility                       PT Education - 07/31/20 1012    Education Details exercise technique, body mechanics    Person(s) Educated Patient    Methods Explanation;Tactile cues;Demonstration;Verbal cues    Comprehension Verbalized understanding;Returned demonstration;Verbal cues required;Tactile cues required            PT Short Term Goals - 07/19/20 0946      PT SHORT TERM GOAL #1   Title Patient will be independent in home exercise program to improve strength/mobility for better functional independence with  ADLs.    Baseline 06/12/20  pt performing HEP 1x/day. 3/30: performing 1x/day, interested in gym membership    Time 4    Period Weeks    Status Partially Met    Target Date 07/10/20      PT SHORT TERM GOAL #2   Title Patient will be able to stand in parallel bars with support of BUE for 30 seconds with adequate weight bearing through BLE.    Baseline 01/03: less than 30s, 06/12/20  pt able to stand in parallel bars 3 mins with min to mod A cueing for posture and knee blocking.    Time 4    Period Weeks    Status Achieved    Target Date 07/10/20             PT Long Term Goals - 07/19/20 0948      PT LONG TERM GOAL #1   Title Patient will be able to stand in parallel bars with support of BUE for 30-60 seconds with adequate weight bearing through BLE.    Baseline 01/03: less than 30s, 06/12/20  pt able to stand in parallel bars 3 mins with min to mod A cueing for posture and knee blocking. 3/30: 60s with mod-max A +2 with blocking bil feet/knees and TC for quad mm activation    Time 8    Period Weeks    Status Partially Met      PT LONG TERM GOAL #2   Title Patient will increase BLE gross strength half a grade as to improve functional strength for increased standing tolerance and increased ADL ability.    Baseline 06/12/20 requires max assist for most movements impacting left LE, right min to mod assist depending on muscle group. 3/30: R hip flex, knee ext 3-/5, requires max A for LLE mvmt    Time 8    Period Weeks    Status Partially Met      PT LONG TERM GOAL #3   Title Patient will reduce joint contracture in BLE ankle plantarflexion by being able to achieve ankle dorsiflexion to neutral for better functional mobility    Baseline 01/03: RLE patient actively moves it by 4 degrees, unable to actively move LLE into DF, 06/12/20  arom into right DF 6 degrees, left unable, trace contraction dorsal foot with DF attempts on left. 3/30: continues to be limited with achieving neutral, but  demo improvements with PROM s/p MT on LLE (~3deg improvement), lacking ~15deg DF on RLE.    Time 8    Period Weeks    Status Partially Met                 Plan - 07/31/20 1015    Clinical Impression Statement Patient tolerated progressive strengthening and stability interventions.He is highly motivated throughout  session however requires cueing for specific  Muscle activation due to preference for compensatory body mechanics. During standing exercises pt was noted to have muscle trembling in the UEs with final bouts of assisted standing and partial stands due to fatigue. Pt will continue to benefit from skilled therapy in order to address remaining goals/deficits still present at this time    Personal Factors and Comorbidities Comorbidity 3+;Time since onset of injury/illness/exacerbation    Comorbidities HIV, Urinary retention with bladder stretch injurt, CNS toxoplasmosis (CMS-HCC), Paraplegia, incomplete, diabetic    Examination-Activity Limitations Bathing    Examination-Participation Restrictions Driving    Stability/Clinical Decision Making Evolving/Moderate complexity    Rehab Potential Fair    PT Frequency 2x / week    PT Duration 8 weeks    PT Treatment/Interventions ADLs/Self Care Home Management;Electrical Stimulation;Moist Heat;Gait training;Stair training;Functional mobility training;Therapeutic activities;Therapeutic exercise;Balance training;Neuromuscular re-education;Wheelchair mobility training;Manual techniques;Passive range of motion;Energy conservation    PT Next Visit Plan Continue with progressive LE strengthening in supine/seated position and continue with Standing balance/endurance in Parallel bars.    PT Home Exercise Plan No updates this session;    Consulted and Agree with Plan of Care Patient           Patient will benefit from skilled therapeutic intervention in order to improve the following deficits and impairments:  Decreased range of motion,Decreased  strength,Impaired flexibility,Difficulty walking,Decreased mobility,Decreased activity tolerance,Decreased endurance,Impaired sensation,Decreased balance,Impaired tone  Visit Diagnosis: Difficulty in walking, not elsewhere classified  Muscle weakness (generalized)  Unsteadiness on feet     Problem List There are no problems to display for this patient.  Janna Arch, PT, DPT   07/31/2020, 10:16 AM  [place] MAIN [facility] SERVICES [address] [place], [place], [address] Phone: [phone]   Fax:  [phone]  Name: Ryan Bush MRN: [id number] Date of Birth: 1975-07-08

## 2020-08-02 ENCOUNTER — Other Ambulatory Visit: Payer: Self-pay

## 2020-08-02 ENCOUNTER — Ambulatory Visit: Payer: 59

## 2020-08-02 DIAGNOSIS — M6281 Muscle weakness (generalized): Secondary | ICD-10-CM | POA: Diagnosis not present

## 2020-08-02 DIAGNOSIS — R2681 Unsteadiness on feet: Secondary | ICD-10-CM

## 2020-08-02 DIAGNOSIS — R262 Difficulty in walking, not elsewhere classified: Secondary | ICD-10-CM | POA: Diagnosis not present

## 2020-08-02 DIAGNOSIS — G8222 Paraplegia, incomplete: Secondary | ICD-10-CM | POA: Diagnosis not present

## 2020-08-02 NOTE — Therapy (Signed)
[place] MAIN [facility] SERVICES [address] [place], [place], [address] Phone: [phone]   Fax:  [phone]  Physical Therapy Treatment  Patient Details  Name: Ryan Bush MRN: [id number] Date of Birth: Aug 02, 1975 Referring Provider (PT): Clarise Cruz   Encounter Date: 08/02/2020   PT End of Session - 08/02/20 0907    Visit Number 24    Number of Visits 28    Date for PT Re-Evaluation 08/14/20    Authorization Type [employer]    Authorization Time Period 06/19/20-08/14/20    PT Start Time 0904    PT Stop Time 0945    PT Time Calculation (min) 41 min    Equipment Utilized During Treatment Gait belt    Activity Tolerance Patient tolerated treatment well;Patient limited by fatigue;No increased pain    Behavior During Therapy WFL for tasks assessed/performed           Past Medical History:  Diagnosis Date  . HIV (human immunodeficiency virus infection) ([place])     History reviewed. No pertinent surgical history.  There were no vitals filed for this visit.   Subjective Assessment - 08/02/20 0907    Subjective Patient reports he was tired after last session but better now. No pain or falls since last session.    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 Patient had PT and OT in the hospital for 3 months. He was DC from PT due to his insurance running out and he needed a different kind of PT. He was discharged home and he had HHPT 2 x week for 1 month. He was dischaged from [place] and was supposed to get out patient PT but his insurance was cancelled. He has a new insurance now and has [place].    Limitations Standing;Walking    How long can you sit comfortably? unlimited    How long can you stand comfortably? 1-3 mins with BUE support    How long can you walk comfortably? unable    Currently in Pain? No/denies                     Treatment:  Interpreter: Loyda   Supine: -hamstring bent knee with leg on PT shoulder 60 seconds x1 trial each LE; deep pressure applied for tension/tone reduction each LE. ; reports LLE more affected than RLE - posterior calf stretch with progressive DF 60 second holds x1 trial each LE.  -single knee to chest with progressive hold x 60 each LE -piriformis stretch, stabilization to limb provided with progressive lengthening x60 second holds    Sidelying: Hip extension with AAROM 10x each side Clamshell with stabilization 10x each side     Seated:  BOSU ball under LEs:  LE stretching/muscle tissue lengthening -seated forward flexed posture with extended LE's for hamstring lengthening, 2x30 sec bouts At cable machine #7.5 weight  -face pulls with Y rope 15x, cues for hand placement and arc of motion -single arm tricep pull down with straight handle 15x each LE; max cueing for sequencing With 5lb dumbells: -bicep curl alternating 12x each UE -chest press 10x -single arm bent over row 12x each UE -alternating punches 8x     Standing in // bars:   standing with x2 assist, one person stabilizing L knee and foot, one person stabilizing R knee and foot, close CGA: static stand 45 seconds x3 trials               -  lateral weight shift 30 seconds x2 trials                    Patient performed with instruction, verbal cues, tactile cues of therapist: goal: increase tissue extensibility, promote proper posture, improve mobility                     PT Education - 08/02/20 0907    Education Details exercise technique, body mechanics    Person(s) Educated Patient    Methods Explanation;Demonstration;Tactile cues;Verbal cues    Comprehension Verbalized understanding;Returned demonstration;Verbal cues required            PT Short Term Goals - 07/19/20 0946      PT SHORT TERM GOAL #1   Title Patient will be independent in home exercise  program to improve strength/mobility for better functional independence with ADLs.    Baseline 06/12/20  pt performing HEP 1x/day. 3/30: performing 1x/day, interested in gym membership    Time 4    Period Weeks    Status Partially Met    Target Date 07/10/20      PT SHORT TERM GOAL #2   Title Patient will be able to stand in parallel bars with support of BUE for 30 seconds with adequate weight bearing through BLE.    Baseline 01/03: less than 30s, 06/12/20  pt able to stand in parallel bars 3 mins with min to mod A cueing for posture and knee blocking.    Time 4    Period Weeks    Status Achieved    Target Date 07/10/20             PT Long Term Goals - 07/19/20 0948      PT LONG TERM GOAL #1   Title Patient will be able to stand in parallel bars with support of BUE for 30-60 seconds with adequate weight bearing through BLE.    Baseline 01/03: less than 30s, 06/12/20  pt able to stand in parallel bars 3 mins with min to mod A cueing for posture and knee blocking. 3/30: 60s with mod-max A +2 with blocking bil feet/knees and TC for quad mm activation    Time 8    Period Weeks    Status Partially Met      PT LONG TERM GOAL #2   Title Patient will increase BLE gross strength half a grade as to improve functional strength for increased standing tolerance and increased ADL ability.    Baseline 06/12/20 requires max assist for most movements impacting left LE, right min to mod assist depending on muscle group. 3/30: R hip flex, knee ext 3-/5, requires max A for LLE mvmt    Time 8    Period Weeks    Status Partially Met      PT LONG TERM GOAL #3   Title Patient will reduce joint contracture in BLE ankle plantarflexion by being able to achieve ankle dorsiflexion to neutral for better functional mobility    Baseline 01/03: RLE patient actively moves it by 4 degrees, unable to actively move LLE into DF, 06/12/20  arom into right DF 6 degrees, left unable, trace contraction dorsal foot with DF  attempts on left. 3/30: continues to be limited with achieving neutral, but demo improvements with PROM s/p MT on LLE (~3deg improvement), lacking ~15deg DF on RLE.    Time 8    Period Weeks    Status Partially Met  Plan - 08/02/20 0958    Clinical Impression Statement Patient is highly motivated throughout session. Introduction to gym interventions performed with patient tolerating well however requires max cueing for sequencing due to poor body mechanics. Patient is very fatigued by end of session and requires rest break. Pt will continue to benefit from skilled therapy in order to address remaining goals/deficits still present at this time    Personal Factors and Comorbidities Comorbidity 3+;Time since onset of injury/illness/exacerbation    Comorbidities HIV, Urinary retention with bladder stretch injurt, CNS toxoplasmosis (CMS-HCC), Paraplegia, incomplete, diabetic    Examination-Activity Limitations Bathing    Examination-Participation Restrictions Driving    Stability/Clinical Decision Making Evolving/Moderate complexity    Rehab Potential Fair    PT Frequency 2x / week    PT Duration 8 weeks    PT Treatment/Interventions ADLs/Self Care Home Management;Electrical Stimulation;Moist Heat;Gait training;Stair training;Functional mobility training;Therapeutic activities;Therapeutic exercise;Balance training;Neuromuscular re-education;Wheelchair mobility training;Manual techniques;Passive range of motion;Energy conservation    PT Next Visit Plan Continue with progressive LE strengthening in supine/seated position and continue with Standing balance/endurance in Parallel bars.    PT Home Exercise Plan No updates this session;    Consulted and Agree with Plan of Care Patient           Patient will benefit from skilled therapeutic intervention in order to improve the following deficits and impairments:  Decreased range of motion,Decreased strength,Impaired  flexibility,Difficulty walking,Decreased mobility,Decreased activity tolerance,Decreased endurance,Impaired sensation,Decreased balance,Impaired tone  Visit Diagnosis: Difficulty in walking, not elsewhere classified  Muscle weakness (generalized)  Unsteadiness on feet     Problem List There are no problems to display for this patient.  Janna Arch, PT, DPT   08/02/2020, 9:59 AM  [place] MAIN [facility] SERVICES [address] [place], [place], [address] Phone: [phone]   Fax:  [phone]  Name: Ryan Bush MRN: [id number] Date of Birth: [birth year]

## 2020-08-07 ENCOUNTER — Other Ambulatory Visit: Payer: Self-pay

## 2020-08-07 ENCOUNTER — Ambulatory Visit: Payer: 59

## 2020-08-07 DIAGNOSIS — R2681 Unsteadiness on feet: Secondary | ICD-10-CM

## 2020-08-07 DIAGNOSIS — G8222 Paraplegia, incomplete: Secondary | ICD-10-CM | POA: Diagnosis not present

## 2020-08-07 DIAGNOSIS — R262 Difficulty in walking, not elsewhere classified: Secondary | ICD-10-CM | POA: Diagnosis not present

## 2020-08-07 DIAGNOSIS — M6281 Muscle weakness (generalized): Secondary | ICD-10-CM | POA: Diagnosis not present

## 2020-08-07 NOTE — Therapy (Signed)
[place] MAIN [facility] SERVICES [address] [place], [place], [address] Phone: [phone]   Fax:  [phone]  Physical Therapy Treatment  Patient Details  Name: Ryan Bush MRN: [id number] Date of Birth: 02/17/1976 Referring Provider (PT): Clarise Cruz   Encounter Date: 08/07/2020   PT End of Session - 08/07/20 1006    Visit Number 25    Number of Visits 28    Date for PT Re-Evaluation 08/14/20    Authorization Type [employer]    Authorization Time Period 06/19/20-08/14/20    PT Start Time 0910    PT Stop Time 0946    PT Time Calculation (min) 36 min    Equipment Utilized During Treatment Gait belt    Activity Tolerance Patient tolerated treatment well;Patient limited by fatigue;No increased pain    Behavior During Therapy WFL for tasks assessed/performed           Past Medical History:  Diagnosis Date  . HIV (human immunodeficiency virus infection) ([place])     History reviewed. No pertinent surgical history.  There were no vitals filed for this visit.   Subjective Assessment - 08/07/20 0924    Subjective Patient is late to PT session. Reports no falls or LOB. Has been compliant with HEP.    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 Patient had PT and OT in the hospital for 3 months. He was DC from PT due to his insurance running out and he needed a different kind of PT. He was discharged home and he had HHPT 2 x week for 1 month. He was dischaged from [place] and was supposed to get out patient PT but his insurance was cancelled. He has a new insurance now and has [place].    Limitations Standing;Walking    How long can you sit comfortably? unlimited    How long can you stand comfortably? 1-3 mins with BUE support    How long can you walk comfortably? unable    Currently in Pain? No/denies                  Treatment:  Interpreter: Milly    Supine: -hamstring bent knee with leg on PT shoulder 60 seconds x1 trial each LE; deep pressure applied for tension/tone reduction each LE. ; reports LLE more affected than RLE - posterior calf stretch with progressive DF 60 second holds x1 trial each LE.  -single knee to chest with progressive hold x 60 each LE -piriformis stretch, stabilization to limb provided with progressive lengthening x60 second holds    Sidelying: Hip extension with AAROM 10x each side Clamshell with stabilization 10x each side   reverse clamshell AAROM 10x each side    Seated:  BOSU ball under LEs:  LE stretching/muscle tissue lengthening seated forward flexed posture with extended LE's for hamstring lengthening, 2x30 sec bouts  RTB straight arm flexion 10x RTB abduction 12x     Standing in // bars:  -partial sit to stand 10x stabilization to knees provided with cues for weightbearing through LE's; 3 sets  standing with x3 assist, one person stabilizing L knee and foot, one person stabilizing R knee and foot, close CGA: static stand 45 seconds x3 trials               -lateral weight shift 30 seconds x2 trials  Patient performed with instruction, verbal cues, tactile cues of therapist: goal: increase tissue extensibility, promote proper posture, improve mobility    Patient's session limited by late arrival. He remains highly motivated but is limited by transportation in bad weather. Patient is aware that next week will be his recert. During standing exercises pt was noted to have muscle trembling in the UEs with final bouts of assisted standing and partial stands due to fatigue. Pt will continue to benefit from skilled therapy in order to address remaining goals/deficits still present at this time                 PT Education - 08/07/20 0925    Education Details exercise technique, body mechanics    Person(s) Educated  Patient    Methods Explanation;Demonstration;Tactile cues;Verbal cues    Comprehension Verbalized understanding;Returned demonstration;Verbal cues required;Tactile cues required            PT Short Term Goals - 07/19/20 0946      PT SHORT TERM GOAL #1   Title Patient will be independent in home exercise program to improve strength/mobility for better functional independence with ADLs.    Baseline 06/12/20  pt performing HEP 1x/day. 3/30: performing 1x/day, interested in gym membership    Time 4    Period Weeks    Status Partially Met    Target Date 07/10/20      PT SHORT TERM GOAL #2   Title Patient will be able to stand in parallel bars with support of BUE for 30 seconds with adequate weight bearing through BLE.    Baseline 01/03: less than 30s, 06/12/20  pt able to stand in parallel bars 3 mins with min to mod A cueing for posture and knee blocking.    Time 4    Period Weeks    Status Achieved    Target Date 07/10/20             PT Long Term Goals - 07/19/20 0948      PT LONG TERM GOAL #1   Title Patient will be able to stand in parallel bars with support of BUE for 30-60 seconds with adequate weight bearing through BLE.    Baseline 01/03: less than 30s, 06/12/20  pt able to stand in parallel bars 3 mins with min to mod A cueing for posture and knee blocking. 3/30: 60s with mod-max A +2 with blocking bil feet/knees and TC for quad mm activation    Time 8    Period Weeks    Status Partially Met      PT LONG TERM GOAL #2   Title Patient will increase BLE gross strength half a grade as to improve functional strength for increased standing tolerance and increased ADL ability.    Baseline 06/12/20 requires max assist for most movements impacting left LE, right min to mod assist depending on muscle group. 3/30: R hip flex, knee ext 3-/5, requires max A for LLE mvmt    Time 8    Period Weeks    Status Partially Met      PT LONG TERM GOAL #3   Title Patient will reduce joint  contracture in BLE ankle plantarflexion by being able to achieve ankle dorsiflexion to neutral for better functional mobility    Baseline 01/03: RLE patient actively moves it by 4 degrees, unable to actively move LLE into DF, 06/12/20  arom into right DF 6 degrees, left unable, trace contraction dorsal foot with DF attempts on left.  3/30: continues to be limited with achieving neutral, but demo improvements with PROM s/p MT on LLE (~3deg improvement), lacking ~15deg DF on RLE.    Time 8    Period Weeks    Status Partially Met                 Plan - 08/07/20 1015    Clinical Impression Statement Patient's session limited by late arrival. He remains highly motivated but is limited by transportation in bad weather. Patient is aware that next week will be his recert. During standing exercises pt was noted to have muscle trembling in the UEs with final bouts of assisted standing and partial stands due to fatigue. Pt will continue to benefit from skilled therapy in order to address remaining goals/deficits still present at this time    Personal Factors and Comorbidities Comorbidity 3+;Time since onset of injury/illness/exacerbation    Comorbidities HIV, Urinary retention with bladder stretch injurt, CNS toxoplasmosis (CMS-HCC), Paraplegia, incomplete, diabetic    Examination-Activity Limitations Bathing    Examination-Participation Restrictions Driving    Stability/Clinical Decision Making Evolving/Moderate complexity    Rehab Potential Fair    PT Frequency 2x / week    PT Duration 8 weeks    PT Treatment/Interventions ADLs/Self Care Home Management;Electrical Stimulation;Moist Heat;Gait training;Stair training;Functional mobility training;Therapeutic activities;Therapeutic exercise;Balance training;Neuromuscular re-education;Wheelchair mobility training;Manual techniques;Passive range of motion;Energy conservation    PT Next Visit Plan Continue with progressive LE strengthening in supine/seated  position and continue with Standing balance/endurance in Parallel bars.    PT Home Exercise Plan No updates this session;    Consulted and Agree with Plan of Care Patient           Patient will benefit from skilled therapeutic intervention in order to improve the following deficits and impairments:  Decreased range of motion,Decreased strength,Impaired flexibility,Difficulty walking,Decreased mobility,Decreased activity tolerance,Decreased endurance,Impaired sensation,Decreased balance,Impaired tone  Visit Diagnosis: Difficulty in walking, not elsewhere classified  Muscle weakness (generalized)  Unsteadiness on feet     Problem List There are no problems to display for this patient.  Janna Arch, PT, DPT    08/07/2020, 10:18 AM  [place] MAIN [facility] SERVICES [address] [place], [place], [address] Phone: [phone]   Fax:  [phone]  Name: Ryan Bush MRN: [id number] Date of Birth: 06/22/1975

## 2020-08-09 ENCOUNTER — Other Ambulatory Visit: Payer: Self-pay

## 2020-08-09 ENCOUNTER — Ambulatory Visit: Payer: 59

## 2020-08-09 DIAGNOSIS — R2681 Unsteadiness on feet: Secondary | ICD-10-CM

## 2020-08-09 DIAGNOSIS — R262 Difficulty in walking, not elsewhere classified: Secondary | ICD-10-CM

## 2020-08-09 DIAGNOSIS — M6281 Muscle weakness (generalized): Secondary | ICD-10-CM | POA: Diagnosis not present

## 2020-08-09 DIAGNOSIS — G8222 Paraplegia, incomplete: Secondary | ICD-10-CM | POA: Diagnosis not present

## 2020-08-09 NOTE — Therapy (Signed)
[facility] MAIN REHAB SERVICES [address] [place], [place], [address] Phone: [phone]   Fax:  [phone]  Physical Therapy Treatment  Patient Details  Name: Ryan Bush MRN: [id number] Date of Birth: 12/15/1975 Referring Provider (PT): Christine Cleaveland   Encounter Date: 08/09/2020   PT End of Session - 08/09/20 0856    Visit Number 56    Number of Visits 28    Date for PT Re-Evaluation 08/14/20    Authorization Type [employer]    Authorization Time Period 06/19/20-08/14/20    PT Start Time 0859    PT Stop Time 0944    PT Time Calculation (min) 45 min    Equipment Utilized During Treatment Gait belt    Activity Tolerance Patient tolerated treatment well;Patient limited by fatigue;No increased pain    Behavior During Therapy WFL for tasks assessed/performed           Past Medical History:  Diagnosis Date  . HIV (human immunodeficiency virus infection) (HCC)     History reviewed. No pertinent surgical history.  There were no vitals filed for this visit.            Treatment:  Interpreter: Loyda   Supine: -hamstring bent knee with leg on PT shoulder 60 seconds x1 trial each LE; deep pressure applied for tension/tone reduction each LE. ; reports LLE more affected than RLE - posterior calf stretch with progressive DF 60 second holds x1 trial each LE.  -single knee to chest with progressive hold x 60 each LE -piriformis stretch, stabilization to limb provided with progressive lengthening x60 second holds    Sidelying: Hip extension with AAROM 10x each side Clamshell with stabilization 10x each side  Reverse clamshell AAROM 10x each side Hip flexor lengthening stretch 30 seconds each LE    Seated:  BOSU ball under LEs:  LE stretching/muscle tissue lengthening -seated forward flexed posture with extended LE's for hamstring lengthening, 2x30 sec bouts With 5lb dumbells: -bilateral arm abduction  with elbow bent 10x  -chest press 10x -single arm bent over row 12x each UE -alternating punches 8x      Standing in // bars:  -partial sit to stand 10x stabilization to knees provided with cues for weightbearing through LE's;3sets  standing with x2 assist, stabilizing bilateral knees and feet, , close CGA: static stand  x3 trials               -lateral weight shift 30 seconds x2 trials                    Patient performed with instruction, verbal cues, tactile cues of therapist: goal: increase tissue extensibility, promote proper posture, improve mobility     Patient presents to physical therapy with excellent motivation throughout session. Patient challenged with prolonged holds in standing position at this time. [facility] contacted with interpreter to schedule appointment for patient to receive his KAFO. Pt will continue to benefit from skilled therapy in order to address remaining goals/deficits still present at this time                PT Education - 08/09/20 0856    Education Details exercise technique, body mechanics    Person(s) Educated Patient    Methods Explanation;Demonstration;Tactile cues;Verbal cues    Comprehension Verbalized understanding;Returned demonstration;Verbal cues required;Tactile cues required            PT Short Term Goals - 07/19/20 0946        PT SHORT TERM GOAL #1   Title Patient will be independent in home exercise program to improve strength/mobility for better functional independence with ADLs.    Baseline 06/12/20  pt performing HEP 1x/day. 3/30: performing 1x/day, interested in gym membership    Time 4    Period Weeks    Status Partially Met    Target Date 07/10/20      PT SHORT TERM GOAL #2   Title Patient will be able to stand in parallel bars with support of BUE for 30 seconds with adequate weight bearing through BLE.    Baseline 01/03: less than 30s, 06/12/20  pt able to stand in parallel bars 3 mins with min to mod A  cueing for posture and knee blocking.    Time 4    Period Weeks    Status Achieved    Target Date 07/10/20             PT Long Term Goals - 07/19/20 0948      PT LONG TERM GOAL #1   Title Patient will be able to stand in parallel bars with support of BUE for 30-60 seconds with adequate weight bearing through BLE.    Baseline 01/03: less than 30s, 06/12/20  pt able to stand in parallel bars 3 mins with min to mod A cueing for posture and knee blocking. 3/30: 60s with mod-max A +2 with blocking bil feet/knees and TC for quad mm activation    Time 8    Period Weeks    Status Partially Met      PT LONG TERM GOAL #2   Title Patient will increase BLE gross strength half a grade as to improve functional strength for increased standing tolerance and increased ADL ability.    Baseline 06/12/20 requires max assist for most movements impacting left LE, right min to mod assist depending on muscle group. 3/30: R hip flex, knee ext 3-/5, requires max A for LLE mvmt    Time 8    Period Weeks    Status Partially Met      PT LONG TERM GOAL #3   Title Patient will reduce joint contracture in BLE ankle plantarflexion by being able to achieve ankle dorsiflexion to neutral for better functional mobility    Baseline 01/03: RLE patient actively moves it by 4 degrees, unable to actively move LLE into DF, 06/12/20  arom into right DF 6 degrees, left unable, trace contraction dorsal foot with DF attempts on left. 3/30: continues to be limited with achieving neutral, but demo improvements with PROM s/p MT on LLE (~3deg improvement), lacking ~15deg DF on RLE.    Time 8    Period Weeks    Status Partially Met                 Plan - 08/09/20 1003    Clinical Impression Statement Patient presents to physical therapy with excellent motivation throughout session. Patient challenged with prolonged holds in standing position at this time. [facility] contacted with interpreter to schedule appointment for  patient to receive his KAFO. Pt will continue to benefit from skilled therapy in order to address remaining goals/deficits still present at this time    Personal Factors and Comorbidities Comorbidity 3+;Time since onset of injury/illness/exacerbation    Comorbidities HIV, Urinary retention with bladder stretch injurt, CNS toxoplasmosis (CMS-HCC), Paraplegia, incomplete, diabetic    Examination-Activity Limitations Bathing    Examination-Participation Restrictions Driving    Stability/Clinical Decision Making Evolving/Moderate complexity      Rehab Potential Fair    PT Frequency 2x / week    PT Duration 8 weeks    PT Treatment/Interventions ADLs/Self Care Home Management;Electrical Stimulation;Moist Heat;Gait training;Stair training;Functional mobility training;Therapeutic activities;Therapeutic exercise;Balance training;Neuromuscular re-education;Wheelchair mobility training;Manual techniques;Passive range of motion;Energy conservation    PT Next Visit Plan Continue with progressive LE strengthening in supine/seated position and continue with Standing balance/endurance in Parallel bars.    PT Home Exercise Plan No updates this session;    Consulted and Agree with Plan of Care Patient           Patient will benefit from skilled therapeutic intervention in order to improve the following deficits and impairments:  Decreased range of motion,Decreased strength,Impaired flexibility,Difficulty walking,Decreased mobility,Decreased activity tolerance,Decreased endurance,Impaired sensation,Decreased balance,Impaired tone  Visit Diagnosis: Difficulty in walking, not elsewhere classified  Muscle weakness (generalized)  Unsteadiness on feet     Problem List There are no problems to display for this patient.  Janna Arch, PT, DPT   08/09/2020, 10:04 AM  [place] MAIN [facility] SERVICES [address] [place], [place], [address] Phone: [phone]   Fax:   [phone]  Name: Ryan Bush MRN: [id number] Date of Birth: 02-17-1976

## 2020-08-14 ENCOUNTER — Ambulatory Visit: Payer: 59

## 2020-08-16 ENCOUNTER — Other Ambulatory Visit: Payer: Self-pay

## 2020-08-16 ENCOUNTER — Ambulatory Visit: Payer: 59

## 2020-08-16 DIAGNOSIS — G8222 Paraplegia, incomplete: Secondary | ICD-10-CM

## 2020-08-16 DIAGNOSIS — R2681 Unsteadiness on feet: Secondary | ICD-10-CM | POA: Diagnosis not present

## 2020-08-16 DIAGNOSIS — M6281 Muscle weakness (generalized): Secondary | ICD-10-CM

## 2020-08-16 DIAGNOSIS — R262 Difficulty in walking, not elsewhere classified: Secondary | ICD-10-CM | POA: Diagnosis not present

## 2020-08-16 NOTE — Therapy (Signed)
[place] MAIN [facility] SERVICES [address] [place], [place], [address] Phone: [phone]   Fax:  [phone]  Physical Therapy Treatment/RECERT  Patient Details  Name: Ryan Bush MRN: [id number] Date of Birth: 30-Apr-1975 Referring Provider (PT): Clarise Cruz   Encounter Date: 08/16/2020   PT End of Session - 08/16/20 0842    Visit Number 27    Number of Visits 43    Date for PT Re-Evaluation 10/11/20    Authorization Type [employer]    Authorization Time Period 06/19/20-08/14/20    PT Start Time 0845    PT Stop Time 0929    PT Time Calculation (min) 44 min    Equipment Utilized During Treatment Gait belt    Activity Tolerance Patient tolerated treatment well;Patient limited by fatigue;No increased pain    Behavior During Therapy WFL for tasks assessed/performed           Past Medical History:  Diagnosis Date  . HIV (human immunodeficiency virus infection) ([place])     History reviewed. No pertinent surgical history.  There were no vitals filed for this visit.   Subjective Assessment - 08/16/20 1452    Subjective Patient missed last session due to issues with a ride. Has been compliant with HEP, no falls or LOB since last session.    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 Patient had PT and OT in the hospital for 3 months. He was DC from PT due to his insurance running out and he needed a different kind of PT. He was discharged home and he had HHPT 2 x week for 1 month. He was dischaged from [place] and was supposed to get out patient PT but his insurance was cancelled. He has a new insurance now and has [place].    Limitations Standing;Walking    How long can you sit comfortably? unlimited    How long can you stand comfortably? 1-3 mins with BUE support    How long can you walk comfortably? unable     Currently in Pain? No/denies                 Interpreter Jacqui present  Goals;  Stand 30 seconds: 1 min 16 seconds min/moderate support/blocking of knees  Able to stand with CGA with primary use of UE's for stabilization ; challenged with knee extension and foot control ; second trail able to perform with RLE assisting  Able to stand without support however utilized >80% of arm supports   FOTO: interpreter help with interpretation: 42%   Treat:  -hamstring bent knee with leg on PT shoulder 60 seconds x1trial each LE; deep pressure applied for tension/tone reduction each LE. ; reports LLE more affected than RLE - posterior calf stretch with progressive DF 60 second holds x1trial each LE.  -single knee to chest with progressive hold x 60 each LE -piriformis stretch, stabilization to limb provided with progressive lengthening x60 second holds  HEP demonstrated and performed with patient demonstrating understanding:    Access Code: [email or web address] URL: [email or web address] Date: 08/16/2020 Prepared by: Janna Arch  Exercises Seated Long Arc Quad - 1 x daily - 7 x weekly - 2 sets - 10 reps - 5 hold Seated Long Arc Quad with Strap - 1 x daily - 7 x weekly - 2 sets - 10 reps - 5 hold Seated Quad Set - 1 x daily -  7 x weekly - 2 sets - 10 reps - 5 hold   Education provided throughout session in the form of VC/TC and demonstration to facilitate movement at target joints and correct muscle activation with exercises. The pt demonstrated good carryover within session following cuing.  Patient demonstrates excellent progression towards goals. His RLE is increasing in functional strength and is now able to perform AROM interventions while his LLE continues to require AAROM. He does require less assistance in standing but is reliant >80% on UE at this time. Tactile cueing to quadriceps required for full knee extension bilaterally with patient responding well to tactile and  verbal cueing. Ankle flexibility is improving as patient is able to tolerate prolonged standing position. Still waiting on KAFO's at this time. Pt will continue to benefit from skilled therapy in order to address remaining goals/deficits still present at this time            PT Education - 08/16/20 1452    Education Details goals, POC , HEP    Person(s) Educated Patient    Methods Explanation;Demonstration;Tactile cues;Verbal cues;Handout    Comprehension Verbalized understanding;Returned demonstration;Verbal cues required;Tactile cues required            PT Short Term Goals - 08/16/20 1453      PT SHORT TERM GOAL #1   Title Patient will be independent in home exercise program to improve strength/mobility for better functional independence with ADLs.    Baseline 06/12/20  pt performing HEP 1x/day. 3/30: performing 1x/day, interested in gym membership 4/27: progressing HEP    Time 4    Period Weeks    Status Partially Met    Target Date 09/13/20      PT SHORT TERM GOAL #2   Title Patient will be able to stand in parallel bars with support of BUE for 30 seconds with adequate weight bearing through BLE.    Baseline 01/03: less than 30s, 06/12/20  pt able to stand in parallel bars 3 mins with min to mod A cueing for posture and knee blocking.    Time 4    Period Weeks    Status Achieved    Target Date 07/10/20             PT Long Term Goals - 08/16/20 5329      PT LONG TERM GOAL #1   Title Patient will be able to stand in parallel bars with support of BUE for 30-60 seconds with CGA with adequate weight bearing through BLE.    Baseline 01/03: less than 30s, 06/12/20  pt able to stand in parallel bars 3 mins with min to mod A cueing for posture and knee blocking. 3/30: 60s with mod-max A +2 with blocking bil feet/knees and TC for quad mm activation 4/27: 1 min 15 seconds with min mod blocking of knees    Time 8    Period Weeks    Status Partially Met    Target Date 10/11/20       PT LONG TERM GOAL #2   Title Patient will increase BLE gross strength half a grade as to improve functional strength for increased standing tolerance and increased ADL ability.    Baseline 06/12/20 requires max assist for most movements impacting left LE, right min to mod assist depending on muscle group. 3/30: R hip flex, knee ext 3-/5, requires max A for LLE mvmt 4/27: R hip flex 3 knee extension 3- L trace    Time 8  Period Weeks    Status Partially Met    Target Date 10/11/20      PT LONG TERM GOAL #3   Title Patient will reduce joint contracture in BLE ankle plantarflexion by being able to achieve ankle dorsiflexion to neutral for better functional mobility    Baseline 01/03: RLE patient actively moves it by 4 degrees, unable to actively move LLE into DF, 06/12/20  arom into right DF 6 degrees, left unable, trace contraction dorsal foot with DF attempts on left. 3/30: continues to be limited with achieving neutral, but demo improvements with PROM s/p MT on LLE (~3deg improvement), lacking ~15deg DF on RLE. 4/27: LLE -9 PROM RLE able to AROM to -2 to neutral  with inversion    Time 8    Period Weeks    Status Partially Met    Target Date 10/11/20      PT LONG TERM GOAL #4   Title Patient will be independent with gym program for total body strengthening.    Baseline 4/27: introducing dumbbells    Time 8    Period Weeks    Status New    Target Date 10/11/20      PT LONG TERM GOAL #5   Title Patient will increase FOTO score to equal to or greater than 60%    to demonstrate statistically significant improvement in mobility and quality of life.    Baseline 4/27: 42%    Time 8    Period Weeks    Status New    Target Date 10/11/20                 Plan - 08/16/20 1510    Clinical Impression Statement Patient demonstrates excellent progression towards goals. His RLE is increasing in functional strength and is now able to perform AROM interventions while his LLE continues  to require AAROM. He does require less assistance in standing but is reliant >80% on UE at this time. Tactile cueing to quadriceps required for full knee extension bilaterally with patient responding well to tactile and verbal cueing. Ankle flexibility is improving as patient is able to tolerate prolonged standing position. Still waiting on KAFO's at this time. Pt will continue to benefit from skilled therapy in order to address remaining goals/deficits still present at this time    Personal Factors and Comorbidities Comorbidity 3+;Time since onset of injury/illness/exacerbation    Comorbidities HIV, Urinary retention with bladder stretch injurt, CNS toxoplasmosis (CMS-HCC), Paraplegia, incomplete, diabetic    Examination-Activity Limitations Bathing    Examination-Participation Restrictions Driving    Stability/Clinical Decision Making Evolving/Moderate complexity    Rehab Potential Fair    PT Frequency 2x / week    PT Duration 8 weeks    PT Treatment/Interventions ADLs/Self Care Home Management;Electrical Stimulation;Moist Heat;Gait training;Stair training;Functional mobility training;Therapeutic activities;Therapeutic exercise;Balance training;Neuromuscular re-education;Wheelchair mobility training;Manual techniques;Passive range of motion;Energy conservation    PT Next Visit Plan Continue with progressive LE strengthening in supine/seated position and continue with Standing balance/endurance in Parallel bars.    PT Home Exercise Plan No updates this session;    Consulted and Agree with Plan of Care Patient           Patient will benefit from skilled therapeutic intervention in order to improve the following deficits and impairments:  Decreased range of motion,Decreased strength,Impaired flexibility,Difficulty walking,Decreased mobility,Decreased activity tolerance,Decreased endurance,Impaired sensation,Decreased balance,Impaired tone  Visit Diagnosis: Difficulty in walking, not elsewhere  classified  Muscle weakness (generalized)  Unsteadiness on feet  Paraplegia, incomplete (  [place])     Problem List There are no problems to display for this patient.  Janna Arch, PT, DPT   08/16/2020, 3:13 PM  [place] MAIN [facility] SERVICES [address] [place], [place], [address] Phone: [phone]   Fax:  [phone]  Name: Ryan Bush MRN: [id number] Date of Birth: 1975/12/31

## 2020-08-21 ENCOUNTER — Other Ambulatory Visit: Payer: Self-pay

## 2020-08-21 ENCOUNTER — Ambulatory Visit: Payer: 59 | Attending: Physical Medicine and Rehabilitation

## 2020-08-21 DIAGNOSIS — R2681 Unsteadiness on feet: Secondary | ICD-10-CM

## 2020-08-21 DIAGNOSIS — M6281 Muscle weakness (generalized): Secondary | ICD-10-CM | POA: Diagnosis not present

## 2020-08-21 DIAGNOSIS — G8222 Paraplegia, incomplete: Secondary | ICD-10-CM | POA: Insufficient documentation

## 2020-08-21 DIAGNOSIS — R262 Difficulty in walking, not elsewhere classified: Secondary | ICD-10-CM | POA: Insufficient documentation

## 2020-08-21 NOTE — Therapy (Signed)
[place] MAIN [facility] SERVICES [address] [place], [place], [address] Phone: [phone]   Fax:  [phone]  Physical Therapy Treatment  Patient Details  Name: Ryan Bush MRN: [id number] Date of Birth: 1975-05-14 Referring Provider (PT): Clarise Cruz   Encounter Date: 08/21/2020   PT End of Session - 08/21/20 0845    Visit Number 28    Number of Visits 43    Date for PT Re-Evaluation 10/11/20    Authorization Type [employer]    Authorization Time Period 06/19/20-08/14/20    PT Start Time 0845    PT Stop Time 0929    PT Time Calculation (min) 44 min    Equipment Utilized During Treatment Gait belt    Activity Tolerance Patient tolerated treatment well;Patient limited by fatigue;No increased pain    Behavior During Therapy WFL for tasks assessed/performed           Past Medical History:  Diagnosis Date  . HIV (human immunodeficiency virus infection) ([place])     History reviewed. No pertinent surgical history.  There were no vitals filed for this visit.   Subjective Assessment - 08/21/20 0846    Subjective Patient reports no falls or LOB since last session. Reports no pain today.    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 Patient had PT and OT in the hospital for 3 months. He was DC from PT due to his insurance running out and he needed a different kind of PT. He was discharged home and he had HHPT 2 x week for 1 month. He was dischaged from [place] and was supposed to get out patient PT but his insurance was cancelled. He has a new insurance now and has [place].    Limitations Standing;Walking    How long can you sit comfortably? unlimited    How long can you stand comfortably? 1-3 mins with BUE support    How long can you walk comfortably? unable    Currently in Pain? No/denies                 Treatment:  Interpreter: Jaqui    Supine: -hamstring bent knee with leg on PT shoulder 60 seconds x1 trial each LE; deep pressure applied for tension/tone reduction each LE. ; reports LLE more affected than RLE - posterior calf stretch with progressive DF 60 second holds x1 trial each LE.  -single knee to chest with progressive hold x 60 each LE -piriformis stretch, stabilization to limb provided with progressive lengthening x60 second holds    Sidelying: Hip extension with AAROM 10x each side Clamshell with stabilization 10x each side  Reverse clamshell AAROM 10x each side Hip flexor lengthening stretch 30 seconds each LE    Seated:  BOSU ball under LEs:  LE stretching/muscle tissue lengthening -seated forward flexed posture with extended LE's for hamstring lengthening, 2x30 sec bouts With 5lb dumbells: -bilateral arm abduction with elbow bent 10x  -chest press 10x -single arm bent over row 12x each UE -alternating punches 8x  RTB ER 15x  Green ball adduction 15x AAROM LAQ 10x each LE    Standing in // bars:  -partial sit to stand 10x stabilization to knees provided with cues for weightbearing through LE's; 3 sets    standing with x2 assist, stabilizing bilateral knees and feet, , close CGA: static stand  x3 trials               -  lateral weight shift 30 seconds x2 trials                    Patient performed with instruction, verbal cues, tactile cues of therapist: goal: increase tissue extensibility, promote proper posture, improve mobility  Patient is highly motivated throughout session. He is challenged with foot placement with plantarflexion and inversion bilaterally L worse than R requiring stabilization in standing and min-mod A for positioning once in standing. He has his appointment for KAFO's in may which will greatly improve his functional standing tolerance as well as alignment. Pt will continue to benefit from skilled therapy in order to address remaining  goals/deficits still present at this time                    PT Education - 08/21/20 0845    Education Details exercise technique, body mechanics    Person(s) Educated Patient    Methods Explanation;Demonstration;Tactile cues;Verbal cues    Comprehension Verbalized understanding;Returned demonstration;Tactile cues required;Verbal cues required            PT Short Term Goals - 08/16/20 1453      PT SHORT TERM GOAL #1   Title Patient will be independent in home exercise program to improve strength/mobility for better functional independence with ADLs.    Baseline 06/12/20  pt performing HEP 1x/day. 3/30: performing 1x/day, interested in gym membership 4/27: progressing HEP    Time 4    Period Weeks    Status Partially Met    Target Date 09/13/20      PT SHORT TERM GOAL #2   Title Patient will be able to stand in parallel bars with support of BUE for 30 seconds with adequate weight bearing through BLE.    Baseline 01/03: less than 30s, 06/12/20  pt able to stand in parallel bars 3 mins with min to mod A cueing for posture and knee blocking.    Time 4    Period Weeks    Status Achieved    Target Date 07/10/20             PT Long Term Goals - 08/16/20 8786      PT LONG TERM GOAL #1   Title Patient will be able to stand in parallel bars with support of BUE for 30-60 seconds with CGA with adequate weight bearing through BLE.    Baseline 01/03: less than 30s, 06/12/20  pt able to stand in parallel bars 3 mins with min to mod A cueing for posture and knee blocking. 3/30: 60s with mod-max A +2 with blocking bil feet/knees and TC for quad mm activation 4/27: 1 min 15 seconds with min mod blocking of knees    Time 8    Period Weeks    Status Partially Met    Target Date 10/11/20      PT LONG TERM GOAL #2   Title Patient will increase BLE gross strength half a grade as to improve functional strength for increased standing tolerance and increased ADL ability.     Baseline 06/12/20 requires max assist for most movements impacting left LE, right min to mod assist depending on muscle group. 3/30: R hip flex, knee ext 3-/5, requires max A for LLE mvmt 4/27: R hip flex 3 knee extension 3- L trace    Time 8    Period Weeks    Status Partially Met    Target Date 10/11/20      PT LONG TERM GOAL #  3   Title Patient will reduce joint contracture in BLE ankle plantarflexion by being able to achieve ankle dorsiflexion to neutral for better functional mobility    Baseline 01/03: RLE patient actively moves it by 4 degrees, unable to actively move LLE into DF, 06/12/20  arom into right DF 6 degrees, left unable, trace contraction dorsal foot with DF attempts on left. 3/30: continues to be limited with achieving neutral, but demo improvements with PROM s/p MT on LLE (~3deg improvement), lacking ~15deg DF on RLE. 4/27: LLE -9 PROM RLE able to AROM to -2 to neutral  with inversion    Time 8    Period Weeks    Status Partially Met    Target Date 10/11/20      PT LONG TERM GOAL #4   Title Patient will be independent with gym program for total body strengthening.    Baseline 4/27: introducing dumbbells    Time 8    Period Weeks    Status New    Target Date 10/11/20      PT LONG TERM GOAL #5   Title Patient will increase FOTO score to equal to or greater than 60%    to demonstrate statistically significant improvement in mobility and quality of life.    Baseline 4/27: 42%    Time 8    Period Weeks    Status New    Target Date 10/11/20                 Plan - 08/21/20 0930    Clinical Impression Statement Patient is highly motivated throughout session. He is challenged with foot placement with plantarflexion and inversion bilaterally L worse than R requiring stabilization in standing and min-mod A for positioning once in standing. He has his appointment for KAFO's in may which will greatly improve his functional standing tolerance as well as alignment. Pt will  continue to benefit from skilled therapy in order to address remaining goals/deficits still present at this time    Personal Factors and Comorbidities Comorbidity 3+;Time since onset of injury/illness/exacerbation    Comorbidities HIV, Urinary retention with bladder stretch injurt, CNS toxoplasmosis (CMS-HCC), Paraplegia, incomplete, diabetic    Examination-Activity Limitations Bathing    Examination-Participation Restrictions Driving    Stability/Clinical Decision Making Evolving/Moderate complexity    Rehab Potential Fair    PT Frequency 2x / week    PT Duration 8 weeks    PT Treatment/Interventions ADLs/Self Care Home Management;Electrical Stimulation;Moist Heat;Gait training;Stair training;Functional mobility training;Therapeutic activities;Therapeutic exercise;Balance training;Neuromuscular re-education;Wheelchair mobility training;Manual techniques;Passive range of motion;Energy conservation    PT Next Visit Plan Continue with progressive LE strengthening in supine/seated position and continue with Standing balance/endurance in Parallel bars.    PT Home Exercise Plan No updates this session;    Consulted and Agree with Plan of Care Patient           Patient will benefit from skilled therapeutic intervention in order to improve the following deficits and impairments:  Decreased range of motion,Decreased strength,Impaired flexibility,Difficulty walking,Decreased mobility,Decreased activity tolerance,Decreased endurance,Impaired sensation,Decreased balance,Impaired tone  Visit Diagnosis: Difficulty in walking, not elsewhere classified  Muscle weakness (generalized)  Unsteadiness on feet     Problem List There are no problems to display for this patient.  Janna Arch, PT, DPT   08/21/2020, 9:30 AM  [place] MAIN [facility] SERVICES [address] [place], [place], [address] Phone: [phone]   Fax:  [phone]  Name: Conan Mcmanaway MRN: [id number]  Date of Birth: May 18, 1975

## 2020-08-23 ENCOUNTER — Other Ambulatory Visit: Payer: Self-pay

## 2020-08-23 ENCOUNTER — Ambulatory Visit: Payer: 59

## 2020-08-23 DIAGNOSIS — R262 Difficulty in walking, not elsewhere classified: Secondary | ICD-10-CM

## 2020-08-23 DIAGNOSIS — R2681 Unsteadiness on feet: Secondary | ICD-10-CM | POA: Diagnosis not present

## 2020-08-23 DIAGNOSIS — G8222 Paraplegia, incomplete: Secondary | ICD-10-CM | POA: Diagnosis not present

## 2020-08-23 DIAGNOSIS — M6281 Muscle weakness (generalized): Secondary | ICD-10-CM | POA: Diagnosis not present

## 2020-08-23 NOTE — Therapy (Signed)
[place] MAIN [facility] SERVICES [address] [place], [place], [address] Phone: [phone]   Fax:  [phone]  Physical Therapy Treatment  Patient Details  Name: Ryan Bush MRN: [id number] Date of Birth: 1976/03/22 Referring Provider (PT): Clarise Cruz   Encounter Date: 08/23/2020   PT End of Session - 08/23/20 0928    Visit Number 29    Number of Visits 43    Date for PT Re-Evaluation 10/11/20    Authorization Type [employer]    Authorization Time Period 06/19/20-08/14/20    PT Start Time 0855    PT Stop Time 0930    PT Time Calculation (min) 35 min    Equipment Utilized During Treatment Gait belt    Activity Tolerance Patient tolerated treatment well;Patient limited by fatigue;No increased pain    Behavior During Therapy WFL for tasks assessed/performed           Past Medical History:  Diagnosis Date  . HIV (human immunodeficiency virus infection) ([place])     History reviewed. No pertinent surgical history.  There were no vitals filed for this visit.   Subjective Assessment - 08/23/20 0925    Subjective Patient arrived late limiting sesison duration. Reports no falls or LOB since last session. Has practiced his HEP.    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 Patient had PT and OT in the hospital for 3 months. He was DC from PT due to his insurance running out and he needed a different kind of PT. He was discharged home and he had HHPT 2 x week for 1 month. He was dischaged from [place] and was supposed to get out patient PT but his insurance was cancelled. He has a new insurance now and has [place].    Limitations Standing;Walking    How long can you sit comfortably? unlimited    How long can you stand comfortably? 1-3 mins with BUE support    How long can you walk comfortably? unable    Currently in Pain?  No/denies               Patient arrived late to session limiting session duration.         Treatment:  Interpreter: [place]    Supine: -hamstring bent knee with leg on PT shoulder 60 seconds x1 trial each LE; deep pressure applied for tension/tone reduction each LE. ; reports LLE more affected than RLE - posterior calf stretch with progressive DF 60 second holds x1 trial each LE.  -single knee to chest with progressive hold x 60 each LE -piriformis stretch, stabilization to limb provided with progressive lengthening x60 second holds     Seated: Isometric abduction 10x 3 second holds each LE , hamstrings 10x 3 second holds each LE  RTB ER 15x  Green ball adduction 15x AAROM LAQ 10x each LE    Standing in // bars:  -partial sit to stand 10x stabilization to knees provided with cues for weightbearing through LE's; 3 sets    standing with x2 assist, stabilizing bilateral knees and feet, , close CGA: static stand  x3 trials                              Patient performed with instruction, verbal cues, tactile cues of therapist: goal: increase tissue extensibility, promote proper posture, improve  mobility   Patient's session is limited by late arrival. Focus on increasing utilization of LE's when in standing position performed with tactile cueing and stabilization. Patient continues to rely mostly on arms at this time as we are still waiting on KAFO appointment. He is challenged with foot placement with plantarflexion and inversion bilaterally L worse than R requiring stabilization in standing and min-mod A for positioning once in standing. Pt will continue to benefit from skilled therapy in order to address remaining goals/deficits still present at this time            PT Education - 08/23/20 0925    Education Details exercise technique, body mechanics    Person(s) Educated Patient    Methods Explanation;Demonstration;Tactile cues;Verbal cues    Comprehension  Verbalized understanding;Returned demonstration;Verbal cues required;Tactile cues required            PT Short Term Goals - 08/16/20 1453      PT SHORT TERM GOAL #1   Title Patient will be independent in home exercise program to improve strength/mobility for better functional independence with ADLs.    Baseline 06/12/20  pt performing HEP 1x/day. 3/30: performing 1x/day, interested in gym membership 4/27: progressing HEP    Time 4    Period Weeks    Status Partially Met    Target Date 09/13/20      PT SHORT TERM GOAL #2   Title Patient will be able to stand in parallel bars with support of BUE for 30 seconds with adequate weight bearing through BLE.    Baseline 01/03: less than 30s, 06/12/20  pt able to stand in parallel bars 3 mins with min to mod A cueing for posture and knee blocking.    Time 4    Period Weeks    Status Achieved    Target Date 07/10/20             PT Long Term Goals - 08/16/20 0973      PT LONG TERM GOAL #1   Title Patient will be able to stand in parallel bars with support of BUE for 30-60 seconds with CGA with adequate weight bearing through BLE.    Baseline 01/03: less than 30s, 06/12/20  pt able to stand in parallel bars 3 mins with min to mod A cueing for posture and knee blocking. 3/30: 60s with mod-max A +2 with blocking bil feet/knees and TC for quad mm activation 4/27: 1 min 15 seconds with min mod blocking of knees    Time 8    Period Weeks    Status Partially Met    Target Date 10/11/20      PT LONG TERM GOAL #2   Title Patient will increase BLE gross strength half a grade as to improve functional strength for increased standing tolerance and increased ADL ability.    Baseline 06/12/20 requires max assist for most movements impacting left LE, right min to mod assist depending on muscle group. 3/30: R hip flex, knee ext 3-/5, requires max A for LLE mvmt 4/27: R hip flex 3 knee extension 3- L trace    Time 8    Period Weeks    Status Partially Met     Target Date 10/11/20      PT LONG TERM GOAL #3   Title Patient will reduce joint contracture in BLE ankle plantarflexion by being able to achieve ankle dorsiflexion to neutral for better functional mobility    Baseline 01/03: RLE patient actively moves it  by 4 degrees, unable to actively move LLE into DF, 06/12/20  arom into right DF 6 degrees, left unable, trace contraction dorsal foot with DF attempts on left. 3/30: continues to be limited with achieving neutral, but demo improvements with PROM s/p MT on LLE (~3deg improvement), lacking ~15deg DF on RLE. 4/27: LLE -9 PROM RLE able to AROM to -2 to neutral  with inversion    Time 8    Period Weeks    Status Partially Met    Target Date 10/11/20      PT LONG TERM GOAL #4   Title Patient will be independent with gym program for total body strengthening.    Baseline 4/27: introducing dumbbells    Time 8    Period Weeks    Status New    Target Date 10/11/20      PT LONG TERM GOAL #5   Title Patient will increase FOTO score to equal to or greater than 60%    to demonstrate statistically significant improvement in mobility and quality of life.    Baseline 4/27: 42%    Time 8    Period Weeks    Status New    Target Date 10/11/20                 Plan - 08/23/20 1012    Clinical Impression Statement Patient's session is limited by late arrival. Focus on increasing utilization of LE's when in standing position performed with tactile cueing and stabilization. Patient continues to rely mostly on arms at this time as we are still waiting on KAFO appointment. He is challenged with foot placement with plantarflexion and inversion bilaterally L worse than R requiring stabilization in standing and min-mod A for positioning once in standing. Pt will continue to benefit from skilled therapy in order to address remaining goals/deficits still present at this time    Personal Factors and Comorbidities Comorbidity 3+;Time since onset of  injury/illness/exacerbation    Comorbidities HIV, Urinary retention with bladder stretch injurt, CNS toxoplasmosis (CMS-HCC), Paraplegia, incomplete, diabetic    Examination-Activity Limitations Bathing    Examination-Participation Restrictions Driving    Stability/Clinical Decision Making Evolving/Moderate complexity    Rehab Potential Fair    PT Frequency 2x / week    PT Duration 8 weeks    PT Treatment/Interventions ADLs/Self Care Home Management;Electrical Stimulation;Moist Heat;Gait training;Stair training;Functional mobility training;Therapeutic activities;Therapeutic exercise;Balance training;Neuromuscular re-education;Wheelchair mobility training;Manual techniques;Passive range of motion;Energy conservation    PT Next Visit Plan Continue with progressive LE strengthening in supine/seated position and continue with Standing balance/endurance in Parallel bars.    PT Home Exercise Plan No updates this session;    Consulted and Agree with Plan of Care Patient           Patient will benefit from skilled therapeutic intervention in order to improve the following deficits and impairments:  Decreased range of motion,Decreased strength,Impaired flexibility,Difficulty walking,Decreased mobility,Decreased activity tolerance,Decreased endurance,Impaired sensation,Decreased balance,Impaired tone  Visit Diagnosis: Difficulty in walking, not elsewhere classified  Muscle weakness (generalized)  Unsteadiness on feet     Problem List There are no problems to display for this patient.  Janna Arch, PT, DPT   08/23/2020, 10:13 AM  [place] MAIN [facility] SERVICES [address] [place], [place], [address] Phone: [phone]   Fax:  [phone]  Name: Naji Mehringer MRN: [id number] Date of Birth: [birth year]

## 2020-08-25 DIAGNOSIS — R339 Retention of urine, unspecified: Secondary | ICD-10-CM | POA: Diagnosis not present

## 2020-08-28 ENCOUNTER — Ambulatory Visit: Payer: 59

## 2020-08-28 ENCOUNTER — Other Ambulatory Visit: Payer: Self-pay

## 2020-08-28 DIAGNOSIS — M6281 Muscle weakness (generalized): Secondary | ICD-10-CM

## 2020-08-28 DIAGNOSIS — R262 Difficulty in walking, not elsewhere classified: Secondary | ICD-10-CM

## 2020-08-28 DIAGNOSIS — G8222 Paraplegia, incomplete: Secondary | ICD-10-CM | POA: Diagnosis not present

## 2020-08-28 DIAGNOSIS — R2681 Unsteadiness on feet: Secondary | ICD-10-CM

## 2020-08-28 NOTE — Therapy (Signed)
[place] MAIN [facility] SERVICES [address] [place], [place], [address] Phone: [phone]   Fax:  [phone]  Physical Therapy Treatment /Physical Therapy Progress Note   Dates of reporting period  07/19/20   to   08/28/20  Patient Details  Name: Ryan Bush MRN: [id number] Date of Birth: 1976/04/12 Referring Provider (PT): Clarise Cruz   Encounter Date: 08/28/2020   PT End of Session - 08/28/20 0836    Visit Number 30    Number of Visits 43    Date for PT Re-Evaluation 10/11/20    Authorization Type [employer]    Authorization Time Period 06/19/20-08/14/20; next sessoin 1/10 PN 5/9    PT Start Time 0840    PT Stop Time 0925    PT Time Calculation (min) 45 min    Equipment Utilized During Treatment Gait belt    Activity Tolerance Patient tolerated treatment well;Patient limited by fatigue;No increased pain    Behavior During Therapy WFL for tasks assessed/performed           Past Medical History:  Diagnosis Date  . HIV (human immunodeficiency virus infection) ([place])     History reviewed. No pertinent surgical history.  There were no vitals filed for this visit.   Subjective Assessment - 08/28/20 0936    Subjective Patient reports he had a good weekend, has been compliant with HEP. No falls or LOB since last session.    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 Patient had PT and OT in the hospital for 3 months. He was DC from PT due to his insurance running out and he needed a different kind of PT. He was discharged home and he had HHPT 2 x week for 1 month. He was dischaged from [place] and was supposed to get out patient PT but his insurance was cancelled. He has a new insurance now and has [place].    Limitations Standing;Walking    How long can you sit comfortably? unlimited    How long can you stand  comfortably? 1-3 mins with BUE support    How long can you walk comfortably? unable    Currently in Pain? No/denies               Progress note Goals performed 08/16/20 please refer to this note for further details on goal progressions.     Treatment:  Interpreter: otto   Supine: -hamstring bent knee with leg on PT shoulder 60 seconds x1 trial each LE; deep pressure applied for tension/tone reduction each LE. ; reports LLE more affected than RLE - posterior calf stretch with progressive DF 60 second holds x1 trial each LE.  -single knee to chest with progressive hold x 60 each LE -piriformis stretch, stabilization to limb provided with progressive lengthening x60 second holds    Sidelying: Hip extension with AAROM 10x each side Clamshell with stabilization 10x each side  Reverse clamshell AAROM 10x each side Hip flexor lengthening stretch 30 seconds each LE    Seated:  BOSU ball under LEs:  LE stretching/muscle tissue lengthening -seated forward flexed posture with extended LE's for hamstring lengthening, 2x30 sec bouts AAROM LAQ 10x each LE    Standing in // bars:  -partial sit to stand 10x stabilization to knees provided with cues for weightbearing through LE's; 3 sets    standing with x min/mod assist, stabilizing bilateral knees  and feet, , close CGA: static stand  x3 trials cues for weight acceptance through heels, trunk positioning etc.         -lateral weight shift 30 seconds x2 trials               Patient's condition has the potential to improve in response to therapy. Maximum improvement is yet to be obtained. The anticipated improvement is attainable and reasonable in a generally predictable time.  Patient reports he is not where he wants to be yet but is getting stronger.       Patient performed with instruction, verbal cues, tactile cues of therapist: goal: increase tissue extensibility, promote proper posture, improve mobility     Goals performed 08/16/20  please refer to this note for further details on goal progressions. Patient is tolerating progressive standing interventions with decreasing support required however does continue to require knee stabilization and assistance with foot placement due to limited spatial awareness and coordination of LLE>RLE, RLE is improving in strength and now able to perform AROM.  Patient's condition has the potential to improve in response to therapy. Maximum improvement is yet to be obtained. The anticipated improvement is attainable and reasonable in a generally predictable time.Pt will continue to benefit from skilled therapy in order to address remaining goals/deficits still present at this time                    PT Education - 08/28/20 0835    Education Details exercise technique, body mechanics    Person(s) Educated Patient    Methods Explanation;Demonstration;Tactile cues;Verbal cues    Comprehension Verbalized understanding;Returned demonstration;Verbal cues required;Tactile cues required            PT Short Term Goals - 08/16/20 1453      PT SHORT TERM GOAL #1   Title Patient will be independent in home exercise program to improve strength/mobility for better functional independence with ADLs.    Baseline 06/12/20  pt performing HEP 1x/day. 3/30: performing 1x/day, interested in gym membership 4/27: progressing HEP    Time 4    Period Weeks    Status Partially Met    Target Date 09/13/20      PT SHORT TERM GOAL #2   Title Patient will be able to stand in parallel bars with support of BUE for 30 seconds with adequate weight bearing through BLE.    Baseline 01/03: less than 30s, 06/12/20  pt able to stand in parallel bars 3 mins with min to mod A cueing for posture and knee blocking.    Time 4    Period Weeks    Status Achieved    Target Date 07/10/20             PT Long Term Goals - 08/16/20 2094      PT LONG TERM GOAL #1   Title Patient will be able to stand in parallel  bars with support of BUE for 30-60 seconds with CGA with adequate weight bearing through BLE.    Baseline 01/03: less than 30s, 06/12/20  pt able to stand in parallel bars 3 mins with min to mod A cueing for posture and knee blocking. 3/30: 60s with mod-max A +2 with blocking bil feet/knees and TC for quad mm activation 4/27: 1 min 15 seconds with min mod blocking of knees    Time 8    Period Weeks    Status Partially Met    Target Date 10/11/20  PT LONG TERM GOAL #2   Title Patient will increase BLE gross strength half a grade as to improve functional strength for increased standing tolerance and increased ADL ability.    Baseline 06/12/20 requires max assist for most movements impacting left LE, right min to mod assist depending on muscle group. 3/30: R hip flex, knee ext 3-/5, requires max A for LLE mvmt 4/27: R hip flex 3 knee extension 3- L trace    Time 8    Period Weeks    Status Partially Met    Target Date 10/11/20      PT LONG TERM GOAL #3   Title Patient will reduce joint contracture in BLE ankle plantarflexion by being able to achieve ankle dorsiflexion to neutral for better functional mobility    Baseline 01/03: RLE patient actively moves it by 4 degrees, unable to actively move LLE into DF, 06/12/20  arom into right DF 6 degrees, left unable, trace contraction dorsal foot with DF attempts on left. 3/30: continues to be limited with achieving neutral, but demo improvements with PROM s/p MT on LLE (~3deg improvement), lacking ~15deg DF on RLE. 4/27: LLE -9 PROM RLE able to AROM to -2 to neutral  with inversion    Time 8    Period Weeks    Status Partially Met    Target Date 10/11/20      PT LONG TERM GOAL #4   Title Patient will be independent with gym program for total body strengthening.    Baseline 4/27: introducing dumbbells    Time 8    Period Weeks    Status New    Target Date 10/11/20      PT LONG TERM GOAL #5   Title Patient will increase FOTO score to equal to or  greater than 60%    to demonstrate statistically significant improvement in mobility and quality of life.    Baseline 4/27: 42%    Time 8    Period Weeks    Status New    Target Date 10/11/20                 Plan - 08/28/20 0954    Clinical Impression Statement Goals performed 08/16/20 please refer to this note for further details on goal progressions. Patient is tolerating progressive standing interventions with decreasing support required however does continue to require knee stabilization and assistance with foot placement due to limited spatial awareness and coordination of LLE>RLE, RLE is improving in strength and now able to perform AROM.  Patient's condition has the potential to improve in response to therapy. Maximum improvement is yet to be obtained. The anticipated improvement is attainable and reasonable in a generally predictable time.Pt will continue to benefit from skilled therapy in order to address remaining goals/deficits still present at this time    Personal Factors and Comorbidities Comorbidity 3+;Time since onset of injury/illness/exacerbation    Comorbidities HIV, Urinary retention with bladder stretch injurt, CNS toxoplasmosis (CMS-HCC), Paraplegia, incomplete, diabetic    Examination-Activity Limitations Bathing    Examination-Participation Restrictions Driving    Stability/Clinical Decision Making Evolving/Moderate complexity    Rehab Potential Fair    PT Frequency 2x / week    PT Duration 8 weeks    PT Treatment/Interventions ADLs/Self Care Home Management;Electrical Stimulation;Moist Heat;Gait training;Stair training;Functional mobility training;Therapeutic activities;Therapeutic exercise;Balance training;Neuromuscular re-education;Wheelchair mobility training;Manual techniques;Passive range of motion;Energy conservation    PT Next Visit Plan Continue with progressive LE strengthening in supine/seated position and continue with Standing balance/endurance  in  Parallel bars.    PT Home Exercise Plan No updates this session;    Consulted and Agree with Plan of Care Patient           Patient will benefit from skilled therapeutic intervention in order to improve the following deficits and impairments:  Decreased range of motion,Decreased strength,Impaired flexibility,Difficulty walking,Decreased mobility,Decreased activity tolerance,Decreased endurance,Impaired sensation,Decreased balance,Impaired tone  Visit Diagnosis: Difficulty in walking, not elsewhere classified  Muscle weakness (generalized)  Unsteadiness on feet     Problem List There are no problems to display for this patient.  Janna Arch, PT, DPT   08/28/2020, 9:55 AM  [place] MAIN [facility] SERVICES [address] [place], [place], [address] Phone: [phone]   Fax:  [phone]  Name: Ryan Bush MRN: [id number] Date of Birth: [birth year]

## 2020-08-30 ENCOUNTER — Ambulatory Visit: Payer: 59

## 2020-08-30 ENCOUNTER — Other Ambulatory Visit: Payer: Self-pay

## 2020-08-30 DIAGNOSIS — R2681 Unsteadiness on feet: Secondary | ICD-10-CM

## 2020-08-30 DIAGNOSIS — R262 Difficulty in walking, not elsewhere classified: Secondary | ICD-10-CM | POA: Diagnosis not present

## 2020-08-30 DIAGNOSIS — M6281 Muscle weakness (generalized): Secondary | ICD-10-CM | POA: Diagnosis not present

## 2020-08-30 DIAGNOSIS — G8222 Paraplegia, incomplete: Secondary | ICD-10-CM | POA: Diagnosis not present

## 2020-08-30 NOTE — Therapy (Signed)
[place] MAIN [facility] SERVICES [address] [place], [place], [address] Phone: [phone]   Fax:  [phone]  Physical Therapy Treatment  Patient Details  Name: Ryan Bush MRN: [id number] Date of Birth: 03-Sep-1975 Referring Provider (PT): Clarise Cruz   Encounter Date: 08/30/2020   PT End of Session - 08/30/20 0834    Visit Number 31    Number of Visits 43    Date for PT Re-Evaluation 10/11/20    Authorization Type [employer]    Authorization Time Period 06/19/20-08/14/20; next sessoin 1/10 PN 5/9    PT Start Time 0845    PT Stop Time 0929    PT Time Calculation (min) 44 min    Equipment Utilized During Treatment Gait belt    Activity Tolerance Patient tolerated treatment well;Patient limited by fatigue;No increased pain    Behavior During Therapy WFL for tasks assessed/performed           Past Medical History:  Diagnosis Date  . HIV (human immunodeficiency virus infection) ([place])     History reviewed. No pertinent surgical history.  There were no vitals filed for this visit.   Subjective Assessment - 08/30/20 0846    Subjective Patient reports he felt good after last session. No falls or LOB since last session.    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 Patient had PT and OT in the hospital for 3 months. He was DC from PT due to his insurance running out and he needed a different kind of PT. He was discharged home and he had HHPT 2 x week for 1 month. He was dischaged from [place] and was supposed to get out patient PT but his insurance was cancelled. He has a new insurance now and has [place].    Limitations Standing;Walking    How long can you sit comfortably? unlimited    How long can you stand comfortably? 1-3 mins with BUE support    How long can you walk comfortably? unable    Currently in Pain?  No/denies                    Treatment:  Interpreter: [place]   Supine: -hamstring bent knee with leg on PT shoulder 60 seconds x1 trial each LE; deep pressure applied for tension/tone reduction each LE. ; reports LLE more affected than RLE - posterior calf stretch with progressive DF 60 second holds x1 trial each LE.  -single knee to chest with progressive hold x 60 each LE -piriformis stretch, stabilization to limb provided with progressive lengthening x60 second holds    Sidelying: Hip extension with AAROM 10x each side Clamshell with stabilization 10x each side  Reverse clamshell AAROM 10x each side Hip flexor lengthening stretch 30 seconds each LE    Seated:  BOSU ball under LEs:  LE stretching/muscle tissue lengthening -seated forward flexed posture with extended LE's for hamstring lengthening, 2x30 sec bouts 5lb bar:  -single arm row with tactile cueing for shoulder extension 15x -alternating punches to PT hand 10x each UE -posterior delt raise 10x very challenging for patient    Standing in // bars:  -partial sit to stand 10x stabilization to knees provided with cues for weightbearing through LE's; 2 sets    standing with x min/mod assist, stabilizing bilateral knees and feet, , close CGA: static stand  x3 trials cues for weight  acceptance through heels, trunk positioning etc.         -lateral weight shift 30 seconds x2 trials                  Patient performed with instruction, verbal cues, tactile cues of therapist: goal: increase tissue extensibility, promote proper posture, improve mobility    Patient tolerated all interventions this session with no complaints of pain. He is able to stabilize his RLE with decreased support however is completely reliant upon PT to maintain L knee extension in standing. Extensive plantarflexion requires prolonged holds to decrease tone. Pt will continue to benefit from skilled therapy in order to address remaining  goals/deficits still present at this time               PT Education - 08/30/20 0834    Education Details exercise technique, body mechanics    Person(s) Educated Patient    Methods Explanation;Demonstration;Tactile cues;Verbal cues    Comprehension Verbalized understanding;Returned demonstration;Verbal cues required;Tactile cues required            PT Short Term Goals - 08/16/20 1453      PT SHORT TERM GOAL #1   Title Patient will be independent in home exercise program to improve strength/mobility for better functional independence with ADLs.    Baseline 06/12/20  pt performing HEP 1x/day. 3/30: performing 1x/day, interested in gym membership 4/27: progressing HEP    Time 4    Period Weeks    Status Partially Met    Target Date 09/13/20      PT SHORT TERM GOAL #2   Title Patient will be able to stand in parallel bars with support of BUE for 30 seconds with adequate weight bearing through BLE.    Baseline 01/03: less than 30s, 06/12/20  pt able to stand in parallel bars 3 mins with min to mod A cueing for posture and knee blocking.    Time 4    Period Weeks    Status Achieved    Target Date 07/10/20             PT Long Term Goals - 08/16/20 3557      PT LONG TERM GOAL #1   Title Patient will be able to stand in parallel bars with support of BUE for 30-60 seconds with CGA with adequate weight bearing through BLE.    Baseline 01/03: less than 30s, 06/12/20  pt able to stand in parallel bars 3 mins with min to mod A cueing for posture and knee blocking. 3/30: 60s with mod-max A +2 with blocking bil feet/knees and TC for quad mm activation 4/27: 1 min 15 seconds with min mod blocking of knees    Time 8    Period Weeks    Status Partially Met    Target Date 10/11/20      PT LONG TERM GOAL #2   Title Patient will increase BLE gross strength half a grade as to improve functional strength for increased standing tolerance and increased ADL ability.    Baseline 06/12/20  requires max assist for most movements impacting left LE, right min to mod assist depending on muscle group. 3/30: R hip flex, knee ext 3-/5, requires max A for LLE mvmt 4/27: R hip flex 3 knee extension 3- L trace    Time 8    Period Weeks    Status Partially Met    Target Date 10/11/20      PT LONG TERM GOAL #3  Title Patient will reduce joint contracture in BLE ankle plantarflexion by being able to achieve ankle dorsiflexion to neutral for better functional mobility    Baseline 01/03: RLE patient actively moves it by 4 degrees, unable to actively move LLE into DF, 06/12/20  arom into right DF 6 degrees, left unable, trace contraction dorsal foot with DF attempts on left. 3/30: continues to be limited with achieving neutral, but demo improvements with PROM s/p MT on LLE (~3deg improvement), lacking ~15deg DF on RLE. 4/27: LLE -9 PROM RLE able to AROM to -2 to neutral  with inversion    Time 8    Period Weeks    Status Partially Met    Target Date 10/11/20      PT LONG TERM GOAL #4   Title Patient will be independent with gym program for total body strengthening.    Baseline 4/27: introducing dumbbells    Time 8    Period Weeks    Status New    Target Date 10/11/20      PT LONG TERM GOAL #5   Title Patient will increase FOTO score to equal to or greater than 60%    to demonstrate statistically significant improvement in mobility and quality of life.    Baseline 4/27: 42%    Time 8    Period Weeks    Status New    Target Date 10/11/20                 Plan - 08/30/20 1311    Clinical Impression Statement Patient tolerated all interventions this session with no complaints of pain. He is able to stabilize his RLE with decreased support however is completely reliant upon PT to maintain L knee extension in standing. Extensive plantarflexion requires prolonged holds to decrease tone. Pt will continue to benefit from skilled therapy in order to address remaining goals/deficits still  present at this time    Personal Factors and Comorbidities Comorbidity 3+;Time since onset of injury/illness/exacerbation    Comorbidities HIV, Urinary retention with bladder stretch injurt, CNS toxoplasmosis (CMS-HCC), Paraplegia, incomplete, diabetic    Examination-Activity Limitations Bathing    Examination-Participation Restrictions Driving    Stability/Clinical Decision Making Evolving/Moderate complexity    Rehab Potential Fair    PT Frequency 2x / week    PT Duration 8 weeks    PT Treatment/Interventions ADLs/Self Care Home Management;Electrical Stimulation;Moist Heat;Gait training;Stair training;Functional mobility training;Therapeutic activities;Therapeutic exercise;Balance training;Neuromuscular re-education;Wheelchair mobility training;Manual techniques;Passive range of motion;Energy conservation    PT Next Visit Plan Continue with progressive LE strengthening in supine/seated position and continue with Standing balance/endurance in Parallel bars.    PT Home Exercise Plan No updates this session;    Consulted and Agree with Plan of Care Patient           Patient will benefit from skilled therapeutic intervention in order to improve the following deficits and impairments:  Decreased range of motion,Decreased strength,Impaired flexibility,Difficulty walking,Decreased mobility,Decreased activity tolerance,Decreased endurance,Impaired sensation,Decreased balance,Impaired tone  Visit Diagnosis: Difficulty in walking, not elsewhere classified  Muscle weakness (generalized)  Unsteadiness on feet     Problem List There are no problems to display for this patient.  Janna Arch, PT, DPT   08/30/2020, 1:16 PM  [place] MAIN [facility] SERVICES [address] [place], [place], [address] Phone: [phone]   Fax:  [phone]  Name: Ryan Bush MRN: [id number] Date of Birth: 13-Jun-1975

## 2020-09-04 ENCOUNTER — Ambulatory Visit: Payer: 59

## 2020-09-04 ENCOUNTER — Other Ambulatory Visit: Payer: Self-pay

## 2020-09-04 DIAGNOSIS — M6281 Muscle weakness (generalized): Secondary | ICD-10-CM | POA: Diagnosis not present

## 2020-09-04 DIAGNOSIS — R262 Difficulty in walking, not elsewhere classified: Secondary | ICD-10-CM

## 2020-09-04 DIAGNOSIS — R2681 Unsteadiness on feet: Secondary | ICD-10-CM

## 2020-09-04 DIAGNOSIS — G8222 Paraplegia, incomplete: Secondary | ICD-10-CM | POA: Diagnosis not present

## 2020-09-04 NOTE — Therapy (Signed)
[place] MAIN [facility] SERVICES [address] [place], [place], [address] Phone: [phone]   Fax:  [phone]  Physical Therapy Treatment  Patient Details  Name: Ryan Bush MRN: [id number] Date of Birth: October 27, 1975 Referring Provider (PT): Clarise Cruz   Encounter Date: 09/04/2020   PT End of Session - 09/04/20 0909    Visit Number 32    Number of Visits 43    Date for PT Re-Evaluation 10/11/20    Authorization Type [employer]    Authorization Time Period 06/19/20-08/14/20; next sessoin 2/10 PN 5/9    PT Start Time 0845    PT Stop Time 0929    PT Time Calculation (min) 44 min    Equipment Utilized During Treatment Gait belt    Activity Tolerance Patient tolerated treatment well;Patient limited by fatigue;No increased pain    Behavior During Therapy WFL for tasks assessed/performed           Past Medical History:  Diagnosis Date  . HIV (human immunodeficiency virus infection) ([place])     History reviewed. No pertinent surgical history.  There were no vitals filed for this visit.   Subjective Assessment - 09/04/20 0908    Subjective Patient reports no pain. Has been compliant with HEP.    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 Patient had PT and OT in the hospital for 3 months. He was DC from PT due to his insurance running out and he needed a different kind of PT. He was discharged home and he had HHPT 2 x week for 1 month. He was dischaged from [place] and was supposed to get out patient PT but his insurance was cancelled. He has a new insurance now and has [place].    Limitations Standing;Walking    How long can you sit comfortably? unlimited    How long can you stand comfortably? 1-3 mins with BUE support    How long can you walk comfortably? unable    Currently in Pain? No/denies                    Treatment:  Interpreter: [name]    Supine: -hamstring bent knee with leg on PT shoulder 60 seconds x1 trial each LE; deep pressure applied for tension/tone reduction each LE. ; reports LLE more affected than RLE - posterior calf stretch with progressive DF 60 second holds x1 trial each LE.  -single knee to chest with progressive hold x 60 each LE -piriformis stretch, stabilization to limb provided with progressive lengthening x60 second holds    Sidelying: Hip extension with AAROM 10x each side Clamshell with stabilization 10x each side  Reverse clamshell AAROM 10x each side Hip flexor lengthening stretch 30 seconds each LE      Standing in // bars:  -partial sit to stand 10x stabilization to knees provided with cues for weightbearing through LE's; 2 sets    standing with x min/mod assist, stabilizing bilateral knees and feet, , close CGA: static stand  x3 trials cues for weight acceptance through heels, trunk positioning etc.         -lateral weight shift 30 seconds x2 trials  Seated:  Nustep seat position 9, bilateral leg assists set at medium. Level 1, 8 minutes tactile cueing for quadriceps activation with occasional need for spasticity control to allow for patient to be flat footed.  Patient introduced to Nustep with leg assist positioning tolerating well. Patient challenged with capacity for prolonged standing with entire body trembling as he fatigues. Has appointment for KAFO's on Thursday which will greatly aide in standing tolerance and body mechanics.  As patient's tone progresses he has increased plantarflexion. Pt will continue to benefit from skilled therapy in order to address remaining goals/deficits still present at this time                     PT Education - 09/04/20 0908    Education Details exercise technique, body mechanics    Person(s) Educated Patient    Methods Explanation;Demonstration;Tactile cues;Verbal cues     Comprehension Verbalized understanding;Returned demonstration;Verbal cues required;Tactile cues required            PT Short Term Goals - 08/16/20 1453      PT SHORT TERM GOAL #1   Title Patient will be independent in home exercise program to improve strength/mobility for better functional independence with ADLs.    Baseline 06/12/20  pt performing HEP 1x/day. 3/30: performing 1x/day, interested in gym membership 4/27: progressing HEP    Time 4    Period Weeks    Status Partially Met    Target Date 09/13/20      PT SHORT TERM GOAL #2   Title Patient will be able to stand in parallel bars with support of BUE for 30 seconds with adequate weight bearing through BLE.    Baseline 01/03: less than 30s, 06/12/20  pt able to stand in parallel bars 3 mins with min to mod A cueing for posture and knee blocking.    Time 4    Period Weeks    Status Achieved    Target Date 07/10/20             PT Long Term Goals - 08/16/20 2919      PT LONG TERM GOAL #1   Title Patient will be able to stand in parallel bars with support of BUE for 30-60 seconds with CGA with adequate weight bearing through BLE.    Baseline 01/03: less than 30s, 06/12/20  pt able to stand in parallel bars 3 mins with min to mod A cueing for posture and knee blocking. 3/30: 60s with mod-max A +2 with blocking bil feet/knees and TC for quad mm activation 4/27: 1 min 15 seconds with min mod blocking of knees    Time 8    Period Weeks    Status Partially Met    Target Date 10/11/20      PT LONG TERM GOAL #2   Title Patient will increase BLE gross strength half a grade as to improve functional strength for increased standing tolerance and increased ADL ability.    Baseline 06/12/20 requires max assist for most movements impacting left LE, right min to mod assist depending on muscle group. 3/30: R hip flex, knee ext 3-/5, requires max A for LLE mvmt 4/27: R hip flex 3 knee extension 3- L trace    Time 8    Period Weeks    Status  Partially Met    Target Date 10/11/20      PT LONG TERM GOAL #3   Title Patient will reduce joint contracture in BLE ankle plantarflexion by being able to achieve ankle dorsiflexion to neutral for better functional mobility    Baseline 01/03: RLE patient actively moves it by 4 degrees, unable to actively move LLE into DF, 06/12/20  arom  into right DF 6 degrees, left unable, trace contraction dorsal foot with DF attempts on left. 3/30: continues to be limited with achieving neutral, but demo improvements with PROM s/p MT on LLE (~3deg improvement), lacking ~15deg DF on RLE. 4/27: LLE -9 PROM RLE able to AROM to -2 to neutral  with inversion    Time 8    Period Weeks    Status Partially Met    Target Date 10/11/20      PT LONG TERM GOAL #4   Title Patient will be independent with gym program for total body strengthening.    Baseline 4/27: introducing dumbbells    Time 8    Period Weeks    Status New    Target Date 10/11/20      PT LONG TERM GOAL #5   Title Patient will increase FOTO score to equal to or greater than 60%    to demonstrate statistically significant improvement in mobility and quality of life.    Baseline 4/27: 42%    Time 8    Period Weeks    Status New    Target Date 10/11/20                 Plan - 09/04/20 0940    Clinical Impression Statement Patient introduced to Nustep with leg assist positioning tolerating well. Patient challenged with capacity for prolonged standing with entire body trembling as he fatigues. Has appointment for KAFO's on Thursday which will greatly aide in standing tolerance and body mechanics.  As patient's tone progresses he has increased plantarflexion. Pt will continue to benefit from skilled therapy in order to address remaining goals/deficits still present at this time    Personal Factors and Comorbidities Comorbidity 3+;Time since onset of injury/illness/exacerbation    Comorbidities HIV, Urinary retention with bladder stretch injurt,  CNS toxoplasmosis (CMS-HCC), Paraplegia, incomplete, diabetic    Examination-Activity Limitations Bathing    Examination-Participation Restrictions Driving    Stability/Clinical Decision Making Evolving/Moderate complexity    Rehab Potential Fair    PT Frequency 2x / week    PT Duration 8 weeks    PT Treatment/Interventions ADLs/Self Care Home Management;Electrical Stimulation;Moist Heat;Gait training;Stair training;Functional mobility training;Therapeutic activities;Therapeutic exercise;Balance training;Neuromuscular re-education;Wheelchair mobility training;Manual techniques;Passive range of motion;Energy conservation    PT Next Visit Plan Continue with progressive LE strengthening in supine/seated position and continue with Standing balance/endurance in Parallel bars.    PT Home Exercise Plan No updates this session;    Consulted and Agree with Plan of Care Patient           Patient will benefit from skilled therapeutic intervention in order to improve the following deficits and impairments:  Decreased range of motion,Decreased strength,Impaired flexibility,Difficulty walking,Decreased mobility,Decreased activity tolerance,Decreased endurance,Impaired sensation,Decreased balance,Impaired tone  Visit Diagnosis: Difficulty in walking, not elsewhere classified  Muscle weakness (generalized)  Unsteadiness on feet     Problem List There are no problems to display for this patient.  Janna Arch, PT, DPT    09/04/2020, 9:41 AM  [place] MAIN [facility] SERVICES [address] [place], [place], [address] Phone: [phone]   Fax:  [phone]  Name: Ryan Bush MRN: [id number] Date of Birth: 07/30/1975

## 2020-09-06 ENCOUNTER — Other Ambulatory Visit: Payer: Self-pay

## 2020-09-06 ENCOUNTER — Ambulatory Visit: Payer: 59

## 2020-09-06 DIAGNOSIS — R262 Difficulty in walking, not elsewhere classified: Secondary | ICD-10-CM

## 2020-09-06 DIAGNOSIS — G8222 Paraplegia, incomplete: Secondary | ICD-10-CM | POA: Diagnosis not present

## 2020-09-06 DIAGNOSIS — M6281 Muscle weakness (generalized): Secondary | ICD-10-CM

## 2020-09-06 DIAGNOSIS — R2681 Unsteadiness on feet: Secondary | ICD-10-CM

## 2020-09-06 NOTE — Therapy (Signed)
[place] MAIN [facility] SERVICES [address] [place], [place], [address] Phone: [phone]   Fax:  [phone]  Physical Therapy Treatment  Patient Details  Name: Ryan Bush MRN: [id number] Date of Birth: 16-Jan-1976 Referring Provider (PT): Ryan Bush   Encounter Date: 09/06/2020   PT End of Session - 09/06/20 0925    Visit Number 33    Number of Visits 43    Date for PT Re-Evaluation 10/11/20    Authorization Type [employer]    Authorization Time Period 06/19/20-08/14/20; 3/10 PN 5/9    PT Start Time 0851    PT Stop Time 0930    PT Time Calculation (min) 39 min    Equipment Utilized During Treatment Gait belt    Activity Tolerance Patient tolerated treatment well;Patient limited by fatigue;No increased pain    Behavior During Therapy WFL for tasks assessed/performed           Past Medical History:  Diagnosis Date  . HIV (human immunodeficiency virus infection) ([place])     History reviewed. No pertinent surgical history.  There were no vitals filed for this visit.   Subjective Assessment - 09/06/20 0924    Subjective Patient reports he has his appointment tomorrow for his KAFO fitting.    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 Patient had PT and OT in the hospital for 3 months. He was DC from PT due to his insurance running out and he needed a different kind of PT. He was discharged home and he had HHPT 2 x week for 1 month. He was dischaged from [place] and was supposed to get out patient PT but his insurance was cancelled. He has a new insurance now and has [place].    Limitations Standing;Walking    How long can you sit comfortably? unlimited    How long can you stand comfortably? 1-3 mins with BUE support    How long can you walk comfortably? unable    Currently in Pain? No/denies                      Treatment:  Interpreter: Ryan Bush    Supine: -hamstring bent knee with leg on PT shoulder 60 seconds x1 trial each LE; deep pressure applied for tension/tone reduction each LE. ; reports LLE more affected than RLE - posterior calf stretch with progressive DF 60 second holds x1 trial each LE.  -single knee to chest with progressive hold x 60 each LE -piriformis stretch, stabilization to limb provided with progressive lengthening x60 second holds   Prone:  Hip flexor lengthening 6x 30 second holds   hamstring curl AAROM 10x each LE    Standing in // bars:  -partial sit to stand 10x stabilization to knees provided with cues for weightbearing through LE's; 2 sets    standing with x min/mod assist, stabilizing bilateral knees and feet, , close CGA: static stand  x3 trials cues for weight acceptance through heels, trunk positioning etc.         -lateral weight shift 30 seconds x2 trials   Seated:  Nustep seat position 9, bilateral leg assists set at medium. Level 1, 6 minutes tactile cueing for quadriceps activation with occasional need for spasticity control to allow for patient to be flat footed.      Patient arrived late limiting session duration. He is challenged with  maintaining standing position however is able to obtain standing stance with decreased assistance from PT. Has appointment for KAFO's on Thursday which will greatly aide in standing tolerance and body mechanics. Pt will continue to benefit from skilled therapy in order to address remaining goals/deficits still present at this time                        PT Education - 09/06/20 0925    Education Details exercise technique, body mechanics    Person(s) Educated Patient    Methods Explanation;Demonstration;Tactile cues;Verbal cues    Comprehension Verbalized understanding;Returned demonstration;Verbal cues required;Tactile cues required            PT Short Term Goals - 08/16/20 1453       PT SHORT TERM GOAL #1   Title Patient will be independent in home exercise program to improve strength/mobility for better functional independence with ADLs.    Baseline 06/12/20  pt performing HEP 1x/day. 3/30: performing 1x/day, interested in gym membership 4/27: progressing HEP    Time 4    Period Weeks    Status Partially Met    Target Date 09/13/20      PT SHORT TERM GOAL #2   Title Patient will be able to stand in parallel bars with support of BUE for 30 seconds with adequate weight bearing through BLE.    Baseline 01/03: less than 30s, 06/12/20  pt able to stand in parallel bars 3 mins with min to mod A cueing for posture and knee blocking.    Time 4    Period Weeks    Status Achieved    Target Date 07/10/20             PT Long Term Goals - 08/16/20 5170      PT LONG TERM GOAL #1   Title Patient will be able to stand in parallel bars with support of BUE for 30-60 seconds with CGA with adequate weight bearing through BLE.    Baseline 01/03: less than 30s, 06/12/20  pt able to stand in parallel bars 3 mins with min to mod A cueing for posture and knee blocking. 3/30: 60s with mod-max A +2 with blocking bil feet/knees and TC for quad mm activation 4/27: 1 min 15 seconds with min mod blocking of knees    Time 8    Period Weeks    Status Partially Met    Target Date 10/11/20      PT LONG TERM GOAL #2   Title Patient will increase BLE gross strength half a grade as to improve functional strength for increased standing tolerance and increased ADL ability.    Baseline 06/12/20 requires max assist for most movements impacting left LE, right min to mod assist depending on muscle group. 3/30: R hip flex, knee ext 3-/5, requires max A for LLE mvmt 4/27: R hip flex 3 knee extension 3- L trace    Time 8    Period Weeks    Status Partially Met    Target Date 10/11/20      PT LONG TERM GOAL #3   Title Patient will reduce joint contracture in BLE ankle plantarflexion by being able to  achieve ankle dorsiflexion to neutral for better functional mobility    Baseline 01/03: RLE patient actively moves it by 4 degrees, unable to actively move LLE into DF, 06/12/20  arom into right DF 6 degrees, left unable, trace contraction dorsal foot with DF attempts on  left. 3/30: continues to be limited with achieving neutral, but demo improvements with PROM s/p MT on LLE (~3deg improvement), lacking ~15deg DF on RLE. 4/27: LLE -9 PROM RLE able to AROM to -2 to neutral  with inversion    Time 8    Period Weeks    Status Partially Met    Target Date 10/11/20      PT LONG TERM GOAL #4   Title Patient will be independent with gym program for total body strengthening.    Baseline 4/27: introducing dumbbells    Time 8    Period Weeks    Status New    Target Date 10/11/20      PT LONG TERM GOAL #5   Title Patient will increase FOTO score to equal to or greater than 60%    to demonstrate statistically significant improvement in mobility and quality of life.    Baseline 4/27: 42%    Time 8    Period Weeks    Status New    Target Date 10/11/20                 Plan - 09/06/20 1249    Clinical Impression Statement Patient arrived late limiting session duration. He is challenged with maintaining standing position however is able to obtain standing stance with decreased assistance from PT. Has appointment for KAFO's on Thursday which will greatly aide in standing tolerance and body mechanics. Pt will continue to benefit from skilled therapy in order to address remaining goals/deficits still present at this time    Personal Factors and Comorbidities Comorbidity 3+;Time since onset of injury/illness/exacerbation    Comorbidities HIV, Urinary retention with bladder stretch injurt, CNS toxoplasmosis (CMS-HCC), Paraplegia, incomplete, diabetic    Examination-Activity Limitations Bathing    Examination-Participation Restrictions Driving    Stability/Clinical Decision Making Evolving/Moderate  complexity    Rehab Potential Fair    PT Frequency 2x / week    PT Duration 8 weeks    PT Treatment/Interventions ADLs/Self Care Home Management;Electrical Stimulation;Moist Heat;Gait training;Stair training;Functional mobility training;Therapeutic activities;Therapeutic exercise;Balance training;Neuromuscular re-education;Wheelchair mobility training;Manual techniques;Passive range of motion;Energy conservation    PT Next Visit Plan Continue with progressive LE strengthening in supine/seated position and continue with Standing balance/endurance in Parallel bars.    PT Home Exercise Plan No updates this session;    Consulted and Agree with Plan of Care Patient           Patient will benefit from skilled therapeutic intervention in order to improve the following deficits and impairments:  Decreased range of motion,Decreased strength,Impaired flexibility,Difficulty walking,Decreased mobility,Decreased activity tolerance,Decreased endurance,Impaired sensation,Decreased balance,Impaired tone  Visit Diagnosis: Difficulty in walking, not elsewhere classified  Muscle weakness (generalized)  Unsteadiness on feet  Paraplegia, incomplete ([place])     Problem List There are no problems to display for this patient.  Janna Arch, PT, DPT   09/06/2020, 12:50 PM  [place] MAIN [facility] SERVICES [address] [place], [place], [address] Phone: [phone]   Fax:  [phone]  Name: Ryan Bush MRN: [id number] Date of Birth: 1975-06-10

## 2020-09-11 ENCOUNTER — Ambulatory Visit: Payer: 59

## 2020-09-13 ENCOUNTER — Other Ambulatory Visit: Payer: Self-pay

## 2020-09-13 ENCOUNTER — Ambulatory Visit: Payer: 59

## 2020-09-13 DIAGNOSIS — M6281 Muscle weakness (generalized): Secondary | ICD-10-CM | POA: Diagnosis not present

## 2020-09-13 DIAGNOSIS — R262 Difficulty in walking, not elsewhere classified: Secondary | ICD-10-CM | POA: Diagnosis not present

## 2020-09-13 DIAGNOSIS — R2681 Unsteadiness on feet: Secondary | ICD-10-CM | POA: Diagnosis not present

## 2020-09-13 DIAGNOSIS — G8222 Paraplegia, incomplete: Secondary | ICD-10-CM | POA: Diagnosis not present

## 2020-09-13 NOTE — Therapy (Signed)
[place] MAIN [facility] SERVICES [address] [place], [place], [address] Phone: [phone]   Fax:  [phone]  Physical Therapy Treatment  Patient Details  Name: Ryan Bush MRN: [id number] Date of Birth: 11-Jul-1975 Referring Provider (PT): Clarise Cruz   Encounter Date: 09/13/2020   PT End of Session - 09/13/20 1319    Visit Number 34    Number of Visits 43    Date for PT Re-Evaluation 10/11/20    Authorization Type [employer]    Authorization Time Period 06/19/20-08/14/20; 4/10 PN 5/9    PT Start Time 0854    PT Stop Time 0930    PT Time Calculation (min) 36 min    Equipment Utilized During Treatment Gait belt    Activity Tolerance Patient tolerated treatment well;Patient limited by fatigue;No increased pain    Behavior During Therapy WFL for tasks assessed/performed           Past Medical History:  Diagnosis Date  . HIV (human immunodeficiency virus infection) ([place])     History reviewed. No pertinent surgical history.  There were no vitals filed for this visit.   Subjective Assessment - 09/13/20 0857    Subjective Patient missed last session. [facility] but then found out his insurance doesn't fully cover it and he can't afford to pay it.    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 Patient had PT and OT in the hospital for 3 months. He was DC from PT due to his insurance running out and he needed a different kind of PT. He was discharged home and he had HHPT 2 x week for 1 month. He was dischaged from [facility] and was supposed to get out patient PT but his insurance was cancelled. He has a new insurance now and has [place].    Limitations Standing;Walking    How long can you sit comfortably? unlimited    How long can you stand comfortably? 1-3 mins with BUE support    How long can you walk  comfortably? unable    Currently in Pain? No/denies                 Treatment:  Interpreter: Lyoda    Supine: -hamstring bent knee with leg on PT shoulder 60 seconds x1 trial each LE; deep pressure applied for tension/tone reduction each LE. ; reports LLE more affected than RLE - posterior calf stretch with progressive DF 60 second holds x1 trial each LE.  -single knee to chest with progressive hold x 60 each LE -piriformis stretch, stabilization to limb provided with progressive lengthening x60 second holds   Prone:  Hip flexor lengthening 6x 30 second holds   hamstring curl AAROM 10x each LE    Standing in // bars:  -partial sit to stand 10x stabilization to knees provided with cues for weightbearing through LE's; 2 sets    standing with x min/mod assist, stabilizing bilateral knees and feet, , close CGA: static stand  x3 trials cues for weight acceptance through heels, trunk positioning etc.         -lateral weight shift 30 seconds x2 trials   Call with [employer] with interpreter to schedule wheelchair fix/new parts due to chair falling apart, wheel falling off, and breaks no longer working.    Patient's session is limited by late arrival as well as call to [employer] in  regards to his wheelchair. Patient is unable to get his KAFOs due to insurance not covering whole cost and reports the cost would be about $4000 dollars. Reaching out to charitable foundations to see if there are any alternate ways to obtain KAFOs. Pt will continue to benefit from skilled therapy in order to address remaining goals/deficits still present at this time                        PT Education - 09/13/20 1319    Education Details [employer], exercise technique    Person(s) Educated Patient    Methods Explanation;Demonstration;Tactile cues;Verbal cues    Comprehension Verbalized understanding;Returned demonstration;Verbal cues required;Tactile cues required            PT Short Term  Goals - 08/16/20 1453      PT SHORT TERM GOAL #1   Title Patient will be independent in home exercise program to improve strength/mobility for better functional independence with ADLs.    Baseline 06/12/20  pt performing HEP 1x/day. 3/30: performing 1x/day, interested in gym membership 4/27: progressing HEP    Time 4    Period Weeks    Status Partially Met    Target Date 09/13/20      PT SHORT TERM GOAL #2   Title Patient will be able to stand in parallel bars with support of BUE for 30 seconds with adequate weight bearing through BLE.    Baseline 01/03: less than 30s, 06/12/20  pt able to stand in parallel bars 3 mins with min to mod A cueing for posture and knee blocking.    Time 4    Period Weeks    Status Achieved    Target Date 07/10/20             PT Long Term Goals - 08/16/20 5498      PT LONG TERM GOAL #1   Title Patient will be able to stand in parallel bars with support of BUE for 30-60 seconds with CGA with adequate weight bearing through BLE.    Baseline 01/03: less than 30s, 06/12/20  pt able to stand in parallel bars 3 mins with min to mod A cueing for posture and knee blocking. 3/30: 60s with mod-max A +2 with blocking bil feet/knees and TC for quad mm activation 4/27: 1 min 15 seconds with min mod blocking of knees    Time 8    Period Weeks    Status Partially Met    Target Date 10/11/20      PT LONG TERM GOAL #2   Title Patient will increase BLE gross strength half a grade as to improve functional strength for increased standing tolerance and increased ADL ability.    Baseline 06/12/20 requires max assist for most movements impacting left LE, right min to mod assist depending on muscle group. 3/30: R hip flex, knee ext 3-/5, requires max A for LLE mvmt 4/27: R hip flex 3 knee extension 3- L trace    Time 8    Period Weeks    Status Partially Met    Target Date 10/11/20      PT LONG TERM GOAL #3   Title Patient will reduce joint contracture in BLE ankle  plantarflexion by being able to achieve ankle dorsiflexion to neutral for better functional mobility    Baseline 01/03: RLE patient actively moves it by 4 degrees, unable to actively move LLE into DF, 06/12/20  arom into right DF 6  degrees, left unable, trace contraction dorsal foot with DF attempts on left. 3/30: continues to be limited with achieving neutral, but demo improvements with PROM s/p MT on LLE (~3deg improvement), lacking ~15deg DF on RLE. 4/27: LLE -9 PROM RLE able to AROM to -2 to neutral  with inversion    Time 8    Period Weeks    Status Partially Met    Target Date 10/11/20      PT LONG TERM GOAL #4   Title Patient will be independent with gym program for total body strengthening.    Baseline 4/27: introducing dumbbells    Time 8    Period Weeks    Status New    Target Date 10/11/20      PT LONG TERM GOAL #5   Title Patient will increase FOTO score to equal to or greater than 60%    to demonstrate statistically significant improvement in mobility and quality of life.    Baseline 4/27: 42%    Time 8    Period Weeks    Status New    Target Date 10/11/20                 Plan - 09/13/20 1321    Clinical Impression Statement Patient's session is limited by late arrival as well as call to [employer] in regards to his wheelchair. Patient is unable to get his KAFOs due to insurance not covering whole cost and reports the cost would be about $4000 dollars. Reaching out to charitable foundations to see if there are any alternate ways to obtain KAFOs. Pt will continue to benefit from skilled therapy in order to address remaining goals/deficits still present at this time    Personal Factors and Comorbidities Comorbidity 3+;Time since onset of injury/illness/exacerbation    Comorbidities HIV, Urinary retention with bladder stretch injurt, CNS toxoplasmosis (CMS-HCC), Paraplegia, incomplete, diabetic    Examination-Activity Limitations Bathing    Examination-Participation  Restrictions Driving    Stability/Clinical Decision Making Evolving/Moderate complexity    Rehab Potential Fair    PT Frequency 2x / week    PT Duration 8 weeks    PT Treatment/Interventions ADLs/Self Care Home Management;Electrical Stimulation;Moist Heat;Gait training;Stair training;Functional mobility training;Therapeutic activities;Therapeutic exercise;Balance training;Neuromuscular re-education;Wheelchair mobility training;Manual techniques;Passive range of motion;Energy conservation    PT Next Visit Plan Continue with progressive LE strengthening in supine/seated position and continue with Standing balance/endurance in Parallel bars.    PT Home Exercise Plan No updates this session;    Consulted and Agree with Plan of Care Patient           Patient will benefit from skilled therapeutic intervention in order to improve the following deficits and impairments:  Decreased range of motion,Decreased strength,Impaired flexibility,Difficulty walking,Decreased mobility,Decreased activity tolerance,Decreased endurance,Impaired sensation,Decreased balance,Impaired tone  Visit Diagnosis: Difficulty in walking, not elsewhere classified  Muscle weakness (generalized)  Unsteadiness on feet     Problem List There are no problems to display for this patient.  Janna Arch, PT, DPT   09/13/2020, 1:22 PM  [place] MAIN [facility] SERVICES [address] [place], [place], [address] Phone: [phone]   Fax:  [phone]  Name: Ryan Bush MRN: [id number] Date of Birth: 06-28-1975

## 2020-09-20 ENCOUNTER — Ambulatory Visit: Payer: 59

## 2020-09-25 ENCOUNTER — Other Ambulatory Visit: Payer: Self-pay

## 2020-09-25 ENCOUNTER — Ambulatory Visit: Payer: 59 | Attending: Physical Medicine and Rehabilitation

## 2020-09-25 DIAGNOSIS — R2681 Unsteadiness on feet: Secondary | ICD-10-CM | POA: Diagnosis not present

## 2020-09-25 DIAGNOSIS — M6281 Muscle weakness (generalized): Secondary | ICD-10-CM | POA: Diagnosis not present

## 2020-09-25 DIAGNOSIS — R262 Difficulty in walking, not elsewhere classified: Secondary | ICD-10-CM | POA: Diagnosis not present

## 2020-09-25 DIAGNOSIS — R339 Retention of urine, unspecified: Secondary | ICD-10-CM | POA: Diagnosis not present

## 2020-09-25 NOTE — Therapy (Signed)
[place] MAIN [facility] SERVICES [address] [place], [place], [address] Phone: [phone]   Fax:  [phone]  Physical Therapy Treatment  Patient Details  Name: Ryan Bush MRN: [id number] Date of Birth: 09-Mar-1976 Referring Provider (PT): Clarise Cruz   Encounter Date: 09/25/2020   PT End of Session - 09/25/20 0909    Visit Number 35    Number of Visits 43    Date for PT Re-Evaluation 10/11/20    Authorization Type [employer]    Authorization Time Period 06/19/20-08/14/20; 4/10 PN 5/9    PT Start Time 0845    PT Stop Time 0929    PT Time Calculation (min) 44 min    Equipment Utilized During Treatment Gait belt    Activity Tolerance Patient tolerated treatment well;Patient limited by fatigue;No increased pain    Behavior During Therapy WFL for tasks assessed/performed           Past Medical History:  Diagnosis Date  . HIV (human immunodeficiency virus infection) ([place])     History reviewed. No pertinent surgical history.  There were no vitals filed for this visit.   Subjective Assessment - 09/25/20 0848    Subjective Patient missed last week due to wife being ill. . 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 Patient had PT and OT in the hospital for 3 months. He was DC from PT due to his insurance running out and he needed a different kind of PT. He was discharged home and he had HHPT 2 x week for 1 month. He was dischaged from [facility] and was supposed to get out patient PT but his insurance was cancelled. He has a new insurance now and has [place].    Limitations Standing;Walking    How long can you sit comfortably? unlimited    How long can you stand comfortably? 1-3 mins with BUE support    How long can you walk comfortably? unable    Currently in Pain? No/denies                  Treatment:  Interpreter: Jacqui    Supine: -hamstring bent knee with leg on PT shoulder 60 seconds x1 trial each LE; deep pressure applied for tension/tone reduction each LE. ; reports LLE more affected than RLE - posterior calf stretch with progressive DF 60 second holds x1 trial each LE.  -single knee to chest with progressive hold x 60 each LE -piriformis stretch, stabilization to limb provided with progressive lengthening x60 second holds   Heel slide LLE AAROM 10x, RLE AROM 10x  Sidelying: Hip extension 10x each LE, AAROM bilateral LE with decreased assistance required RLE Clamshells 10x each LE    Standing in // bars:  -partial sit to stand 10x stabilization to knees provided with cues for weightbearing through LE's; 3 sets    standing with x min/mod assist, stabilizing bilateral knees and feet, , close CGA: static stand  x4 trials cues for weight acceptance through heels, trunk positioning etc.         -lateral weight shift 30 seconds x2 trials  Patient's session is highly motivated throughout physical therapy session. He has diffuse shaking of entire body with prolonged standing as he fatigues. He is worried about his wife as she is in the hospital at this time.  Pt  will continue to benefit from skilled therapy in order to address remaining goals/deficits still present at this time                       PT Education - 09/25/20 0909    Education Details exercise technique, body mechanics    Person(s) Educated Patient    Methods Explanation;Tactile cues;Demonstration;Verbal cues    Comprehension Verbalized understanding;Returned demonstration;Verbal cues required;Tactile cues required            PT Short Term Goals - 08/16/20 1453      PT SHORT TERM GOAL #1   Title Patient will be independent in home exercise program to improve strength/mobility for better functional independence with ADLs.    Baseline 06/12/20  pt performing HEP 1x/day.  3/30: performing 1x/day, interested in gym membership 4/27: progressing HEP    Time 4    Period Weeks    Status Partially Met    Target Date 09/13/20      PT SHORT TERM GOAL #2   Title Patient will be able to stand in parallel bars with support of BUE for 30 seconds with adequate weight bearing through BLE.    Baseline 01/03: less than 30s, 06/12/20  pt able to stand in parallel bars 3 mins with min to mod A cueing for posture and knee blocking.    Time 4    Period Weeks    Status Achieved    Target Date 07/10/20             PT Long Term Goals - 08/16/20 8101      PT LONG TERM GOAL #1   Title Patient will be able to stand in parallel bars with support of BUE for 30-60 seconds with CGA with adequate weight bearing through BLE.    Baseline 01/03: less than 30s, 06/12/20  pt able to stand in parallel bars 3 mins with min to mod A cueing for posture and knee blocking. 3/30: 60s with mod-max A +2 with blocking bil feet/knees and TC for quad mm activation 4/27: 1 min 15 seconds with min mod blocking of knees    Time 8    Period Weeks    Status Partially Met    Target Date 10/11/20      PT LONG TERM GOAL #2   Title Patient will increase BLE gross strength half a grade as to improve functional strength for increased standing tolerance and increased ADL ability.    Baseline 06/12/20 requires max assist for most movements impacting left LE, right min to mod assist depending on muscle group. 3/30: R hip flex, knee ext 3-/5, requires max A for LLE mvmt 4/27: R hip flex 3 knee extension 3- L trace    Time 8    Period Weeks    Status Partially Met    Target Date 10/11/20      PT LONG TERM GOAL #3   Title Patient will reduce joint contracture in BLE ankle plantarflexion by being able to achieve ankle dorsiflexion to neutral for better functional mobility    Baseline 01/03: RLE patient actively moves it by 4 degrees, unable to actively move LLE into DF, 06/12/20  arom into right DF 6 degrees, left  unable, trace contraction dorsal foot with DF attempts on left. 3/30: continues to be limited with achieving neutral, but demo improvements with PROM s/p MT on LLE (~3deg improvement), lacking ~15deg DF on RLE. 4/27: LLE -9 PROM RLE able to AROM  to -2 to neutral  with inversion    Time 8    Period Weeks    Status Partially Met    Target Date 10/11/20      PT LONG TERM GOAL #4   Title Patient will be independent with gym program for total body strengthening.    Baseline 4/27: introducing dumbbells    Time 8    Period Weeks    Status New    Target Date 10/11/20      PT LONG TERM GOAL #5   Title Patient will increase FOTO score to equal to or greater than 60%    to demonstrate statistically significant improvement in mobility and quality of life.    Baseline 4/27: 42%    Time 8    Period Weeks    Status New    Target Date 10/11/20                 Plan - 09/25/20 5374    Clinical Impression Statement Patient's session is highly motivated throughout physical therapy session. He has diffuse shaking of entire body with prolonged standing as he fatigues. He is worried about his wife as she is in the hospital at this time.  Pt will continue to benefit from skilled therapy in order to address remaining goals/deficits still present at this time    Personal Factors and Comorbidities Comorbidity 3+;Time since onset of injury/illness/exacerbation    Comorbidities HIV, Urinary retention with bladder stretch injurt, CNS toxoplasmosis (CMS-HCC), Paraplegia, incomplete, diabetic    Examination-Activity Limitations Bathing    Examination-Participation Restrictions Driving    Stability/Clinical Decision Making Evolving/Moderate complexity    Rehab Potential Fair    PT Frequency 2x / week    PT Duration 8 weeks    PT Treatment/Interventions ADLs/Self Care Home Management;Electrical Stimulation;Moist Heat;Gait training;Stair training;Functional mobility training;Therapeutic activities;Therapeutic  exercise;Balance training;Neuromuscular re-education;Wheelchair mobility training;Manual techniques;Passive range of motion;Energy conservation    PT Next Visit Plan Continue with progressive LE strengthening in supine/seated position and continue with Standing balance/endurance in Parallel bars.    PT Home Exercise Plan No updates this session;    Consulted and Agree with Plan of Care Patient           Patient will benefit from skilled therapeutic intervention in order to improve the following deficits and impairments:  Decreased range of motion,Decreased strength,Impaired flexibility,Difficulty walking,Decreased mobility,Decreased activity tolerance,Decreased endurance,Impaired sensation,Decreased balance,Impaired tone  Visit Diagnosis: Difficulty in walking, not elsewhere classified  Muscle weakness (generalized)  Unsteadiness on feet     Problem List There are no problems to display for this patient.  Janna Arch, PT, DPT   09/25/2020, 9:30 AM  [place] MAIN [facility] SERVICES [address] [place], [place], [address] Phone: [phone]   Fax:  [phone]  Name: Orian Figueira MRN: [id number] Date of Birth: Mar 04, 1976

## 2020-09-26 ENCOUNTER — Ambulatory Visit: Payer: 59 | Admitting: Urology

## 2020-09-27 ENCOUNTER — Ambulatory Visit: Payer: 59

## 2020-10-02 ENCOUNTER — Ambulatory Visit: Payer: 59

## 2020-10-04 ENCOUNTER — Ambulatory Visit: Payer: 59

## 2020-10-04 ENCOUNTER — Other Ambulatory Visit: Payer: Self-pay

## 2020-10-04 DIAGNOSIS — R262 Difficulty in walking, not elsewhere classified: Secondary | ICD-10-CM | POA: Diagnosis not present

## 2020-10-04 DIAGNOSIS — M6281 Muscle weakness (generalized): Secondary | ICD-10-CM | POA: Diagnosis not present

## 2020-10-04 DIAGNOSIS — R2681 Unsteadiness on feet: Secondary | ICD-10-CM

## 2020-10-04 NOTE — Therapy (Signed)
[place] MAIN [facility] SERVICES [address] [place], [place], [address] Phone: [phone]   Fax:  [phone]  Physical Therapy Treatment  Patient Details  Name: Ryan Bush MRN: [id number] Date of Birth: 13-Jun-1975 Referring Provider (PT): Clarise Cruz   Encounter Date: 10/04/2020   PT End of Session - 10/04/20 0841     Visit Number 36    Number of Visits 43    Date for PT Re-Evaluation 10/11/20    Authorization Type [employer]    Authorization Time Period 06/19/20-08/14/20; 6/10 PN 5/9    PT Start Time 0844    PT Stop Time 0929    PT Time Calculation (min) 45 min    Equipment Utilized During Treatment Gait belt    Activity Tolerance Patient tolerated treatment well;Patient limited by fatigue;No increased pain    Behavior During Therapy WFL for tasks assessed/performed             Past Medical History:  Diagnosis Date   HIV (human immunodeficiency virus infection) ([place])     History reviewed. No pertinent surgical history.  There were no vitals filed for this visit.   Subjective Assessment - 10/04/20 0919     Subjective Patient missed last session due to wife returning from the hospital and the PT was out sick.    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 Patient had PT and OT in the hospital for 3 months. He was DC from PT due to his insurance running out and he needed a different kind of PT. He was discharged home and he had HHPT 2 x week for 1 month. He was dischaged from [place] and was supposed to get out patient PT but his insurance was cancelled. He has a new insurance now and has [place].    Limitations Standing;Walking    How long can you sit comfortably? unlimited    How long can you stand comfortably? 1-3 mins with BUE support    How long can you walk comfortably? unable    Currently in Pain?  No/denies                   Treatment: Interpreter: Maritza   Supine: -hamstring bent knee with leg on PT shoulder 60 seconds x1 trial each LE; deep pressure applied for tension/tone reduction each LE. ; reports LLE more affected than RLE - posterior calf stretch with progressive DF 60 second holds x1 trial each LE. -single knee to chest with progressive hold x 60 each LE -piriformis stretch, stabilization to limb provided with progressive lengthening x60 second holds  -bridge 10x with arms crossed -Heel slide LLE AAROM 10x, RLE AROM 10x   Sidelying: Hip extension 10x each LE, AAROM bilateral LE with decreased assistance required RLE Clamshells 10x each LE    Standing in // bars:  -partial sit to stand 10x stabilization to knees provided with cues for weightbearing through LE's; 2 sets    standing with x min/mod assist, stabilizing bilateral knees and feet, , close CGA: static stand  x5 trials cues for weight acceptance through heels, trunk positioning etc.         -lateral weight shift 30 seconds x2 trials   seated: Hamstring isometric into dynadisc 10x each LE 5 second holds 5lb dumbell bent row 15x  5lb dumbbell chest fly 10x   Pt educated throughout session about proper  posture and technique with exercises. Improved exercise technique, movement at target joints, use of target muscles after min to mod verbal, visual, tactile cues  Patient is hihgly motivated throughout physical therapy session. He fatigues quickly in standing as he has not been seen in approximately one week. LE strength and coordination are more challenged this week as he is more stiff and requires additional assistance with transfers. Pt will continue to benefit from skilled therapy in order to address remaining goals/deficits still present at this time             PT Education - 10/04/20 0842     Education Details exercise technique, body mechanics    Person(s) Educated Patient     Methods Explanation;Demonstration;Tactile cues;Verbal cues    Comprehension Verbalized understanding;Returned demonstration;Verbal cues required;Tactile cues required              PT Short Term Goals - 08/16/20 1453       PT SHORT TERM GOAL #1   Title Patient will be independent in home exercise program to improve strength/mobility for better functional independence with ADLs.    Baseline 06/12/20  pt performing HEP 1x/day. 3/30: performing 1x/day, interested in gym membership 4/27: progressing HEP    Time 4    Period Weeks    Status Partially Met    Target Date 09/13/20      PT SHORT TERM GOAL #2   Title Patient will be able to stand in parallel bars with support of BUE for 30 seconds with adequate weight bearing through BLE.    Baseline 01/03: less than 30s, 06/12/20  pt able to stand in parallel bars 3 mins with min to mod A cueing for posture and knee blocking.    Time 4    Period Weeks    Status Achieved    Target Date 07/10/20               PT Long Term Goals - 08/16/20 0938       PT LONG TERM GOAL #1   Title Patient will be able to stand in parallel bars with support of BUE for 30-60 seconds with CGA with adequate weight bearing through BLE.    Baseline 01/03: less than 30s, 06/12/20  pt able to stand in parallel bars 3 mins with min to mod A cueing for posture and knee blocking. 3/30: 60s with mod-max A +2 with blocking bil feet/knees and TC for quad mm activation 4/27: 1 min 15 seconds with min mod blocking of knees    Time 8    Period Weeks    Status Partially Met    Target Date 10/11/20      PT LONG TERM GOAL #2   Title Patient will increase BLE gross strength half a grade as to improve functional strength for increased standing tolerance and increased ADL ability.    Baseline 06/12/20 requires max assist for most movements impacting left LE, right min to mod assist depending on muscle group. 3/30: R hip flex, knee ext 3-/5, requires max A for LLE mvmt 4/27: R  hip flex 3 knee extension 3- L trace    Time 8    Period Weeks    Status Partially Met    Target Date 10/11/20      PT LONG TERM GOAL #3   Title Patient will reduce joint contracture in BLE ankle plantarflexion by being able to achieve ankle dorsiflexion to neutral for better functional mobility    Baseline 01/03:  RLE patient actively moves it by 4 degrees, unable to actively move LLE into DF, 06/12/20  arom into right DF 6 degrees, left unable, trace contraction dorsal foot with DF attempts on left. 3/30: continues to be limited with achieving neutral, but demo improvements with PROM s/p MT on LLE (~3deg improvement), lacking ~15deg DF on RLE. 4/27: LLE -9 PROM RLE able to AROM to -2 to neutral  with inversion    Time 8    Period Weeks    Status Partially Met    Target Date 10/11/20      PT LONG TERM GOAL #4   Title Patient will be independent with gym program for total body strengthening.    Baseline 4/27: introducing dumbbells    Time 8    Period Weeks    Status New    Target Date 10/11/20      PT LONG TERM GOAL #5   Title Patient will increase FOTO score to equal to or greater than 60%    to demonstrate statistically significant improvement in mobility and quality of life.    Baseline 4/27: 42%    Time 8    Period Weeks    Status New    Target Date 10/11/20                   Plan - 10/04/20 6378     Clinical Impression Statement Patient is hihgly motivated throughout physical therapy session. He fatigues quickly in standing as he has not been seen in approximately one week. LE strength and coordination are more challenged this week as he is more stiff and requires additional assistance with transfers. Pt will continue to benefit from skilled therapy in order to address remaining goals/deficits still present at this time    Personal Factors and Comorbidities Comorbidity 3+;Time since onset of injury/illness/exacerbation    Comorbidities HIV, Urinary retention with  bladder stretch injurt, CNS toxoplasmosis (CMS-HCC), Paraplegia, incomplete, diabetic    Examination-Activity Limitations Bathing    Examination-Participation Restrictions Driving    Stability/Clinical Decision Making Evolving/Moderate complexity    Rehab Potential Fair    PT Frequency 2x / week    PT Duration 8 weeks    PT Treatment/Interventions ADLs/Self Care Home Management;Electrical Stimulation;Moist Heat;Gait training;Stair training;Functional mobility training;Therapeutic activities;Therapeutic exercise;Balance training;Neuromuscular re-education;Wheelchair mobility training;Manual techniques;Passive range of motion;Energy conservation    PT Next Visit Plan Continue with progressive LE strengthening in supine/seated position and continue with Standing balance/endurance in Parallel bars.    PT Home Exercise Plan No updates this session;    Consulted and Agree with Plan of Care Patient             Patient will benefit from skilled therapeutic intervention in order to improve the following deficits and impairments:  Decreased range of motion, Decreased strength, Impaired flexibility, Difficulty walking, Decreased mobility, Decreased activity tolerance, Decreased endurance, Impaired sensation, Decreased balance, Impaired tone  Visit Diagnosis: Difficulty in walking, not elsewhere classified  Muscle weakness (generalized)  Unsteadiness on feet     Problem List There are no problems to display for this patient.   Janna Arch, PT, DPT  10/04/2020, 9:39 AM  [place] MAIN [facility] SERVICES [address] [place], [place], [address] Phone: [phone]   Fax:  [phone]  Name: Ryan Bush MRN: [id number] Date of Birth: October 24, 1975

## 2020-10-09 ENCOUNTER — Ambulatory Visit: Payer: 59

## 2020-10-09 ENCOUNTER — Other Ambulatory Visit: Payer: Self-pay

## 2020-10-09 DIAGNOSIS — M6281 Muscle weakness (generalized): Secondary | ICD-10-CM

## 2020-10-09 DIAGNOSIS — R2681 Unsteadiness on feet: Secondary | ICD-10-CM

## 2020-10-09 DIAGNOSIS — R262 Difficulty in walking, not elsewhere classified: Secondary | ICD-10-CM | POA: Diagnosis not present

## 2020-10-09 NOTE — Therapy (Signed)
[place] MAIN [facility] SERVICES [address] [place], [place], [address] Phone: [phone]   Fax:  [phone]  Physical Therapy Treatment  Patient Details  Name: Ryan Bush MRN: [id number] Date of Birth: Oct 25, 1975 Referring Provider (PT): Clarise Cruz   Encounter Date: 10/09/2020   PT End of Session - 10/09/20 0856     Visit Number 37    Number of Visits 43    Date for PT Re-Evaluation 10/11/20    Authorization Type [employer]    Authorization Time Period 06/19/20-08/14/20; 7/10 PN 5/9    PT Start Time 0845    PT Stop Time 0929    PT Time Calculation (min) 44 min    Equipment Utilized During Treatment Gait belt    Activity Tolerance Patient tolerated treatment well;Patient limited by fatigue;No increased pain    Behavior During Therapy WFL for tasks assessed/performed             Past Medical History:  Diagnosis Date   HIV (human immunodeficiency virus infection) ([place])     History reviewed. No pertinent surgical history.  There were no vitals filed for this visit.   Subjective Assessment - 10/09/20 0857     Subjective Patient reports he had a good fathers day. Has been compliant with his HEP, no falls or LOB since last session.    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 Patient had PT and OT in the hospital for 3 months. He was DC from PT due to his insurance running out and he needed a different kind of PT. He was discharged home and he had HHPT 2 x week for 1 month. He was dischaged from [place] and was supposed to get out patient PT but his insurance was cancelled. He has a new insurance now and has [place].    Limitations Standing;Walking    How long can you sit comfortably? unlimited    How long can you stand comfortably? 1-3 mins with BUE support    How long can you walk comfortably? unable     Currently in Pain? No/denies                Treatment: Interpreter: Maritza   Supine: -hamstring bent knee with leg on PT shoulder 60 seconds x1 trial each LE; deep pressure applied for tension/tone reduction each LE. ; reports LLE more affected than RLE - posterior calf stretch with progressive DF 60 second holds x1 trial each LE. -single knee to chest with progressive hold x 60 each LE -piriformis stretch, stabilization to limb provided with progressive lengthening x60 second holds  -bridge 10x with arms crossed -Heel slide LLE AAROM 10x, RLE AROM 10x   Sidelying: Hip extension 10x each LE, AAROM bilateral LE with decreased assistance required RLE Clamshells 10x each LE    Standing in // bars:  -partial sit to stand 10x stabilization to knees provided with cues for weightbearing through LE's; 2 sets    standing with x min/mod assist, stabilizing bilateral knees and feet, , close CGA: static stand  x5 trials cues for weight acceptance through heels, trunk positioning etc.         -lateral weight shift 30 seconds x2 trials     seated: Hamstring isometric into dynadisc 10x each LE 5 second holds Nustep seat position 9, bilateral leg assists set at medium. Level 1-2, 6 minutes tactile cueing  for quadriceps activation with occasional need for spasticity control to allow for patient to be flat footed.       Pt educated throughout session about proper posture and technique with exercises. Improved exercise technique, movement at target joints, use of target muscles after min to mod verbal, visual, tactile cues   Patient has increased tone initially this session but improves throughout session duration. His standing continues to require blocking of L knee more than R as RLE is increasing with strength. Prolonged standing is fatiguing to patient requiring rest breaks after each one. Pt will continue to benefit from skilled therapy in order to address remaining goals/deficits still  present at this time                       PT Education - 10/09/20 0857     Education Details exercise technique, body mechanics    Methods Explanation;Demonstration;Tactile cues;Verbal cues    Comprehension Verbalized understanding;Returned demonstration;Tactile cues required;Verbal cues required              PT Short Term Goals - 08/16/20 1453       PT SHORT TERM GOAL #1   Title Patient will be independent in home exercise program to improve strength/mobility for better functional independence with ADLs.    Baseline 06/12/20  pt performing HEP 1x/day. 3/30: performing 1x/day, interested in gym membership 4/27: progressing HEP    Time 4    Period Weeks    Status Partially Met    Target Date 09/13/20      PT SHORT TERM GOAL #2   Title Patient will be able to stand in parallel bars with support of BUE for 30 seconds with adequate weight bearing through BLE.    Baseline 01/03: less than 30s, 06/12/20  pt able to stand in parallel bars 3 mins with min to mod A cueing for posture and knee blocking.    Time 4    Period Weeks    Status Achieved    Target Date 07/10/20               PT Long Term Goals - 08/16/20 5462       PT LONG TERM GOAL #1   Title Patient will be able to stand in parallel bars with support of BUE for 30-60 seconds with CGA with adequate weight bearing through BLE.    Baseline 01/03: less than 30s, 06/12/20  pt able to stand in parallel bars 3 mins with min to mod A cueing for posture and knee blocking. 3/30: 60s with mod-max A +2 with blocking bil feet/knees and TC for quad mm activation 4/27: 1 min 15 seconds with min mod blocking of knees    Time 8    Period Weeks    Status Partially Met    Target Date 10/11/20      PT LONG TERM GOAL #2   Title Patient will increase BLE gross strength half a grade as to improve functional strength for increased standing tolerance and increased ADL ability.    Baseline 06/12/20 requires max assist  for most movements impacting left LE, right min to mod assist depending on muscle group. 3/30: R hip flex, knee ext 3-/5, requires max A for LLE mvmt 4/27: R hip flex 3 knee extension 3- L trace    Time 8    Period Weeks    Status Partially Met    Target Date 10/11/20      PT LONG  TERM GOAL #3   Title Patient will reduce joint contracture in BLE ankle plantarflexion by being able to achieve ankle dorsiflexion to neutral for better functional mobility    Baseline 01/03: RLE patient actively moves it by 4 degrees, unable to actively move LLE into DF, 06/12/20  arom into right DF 6 degrees, left unable, trace contraction dorsal foot with DF attempts on left. 3/30: continues to be limited with achieving neutral, but demo improvements with PROM s/p MT on LLE (~3deg improvement), lacking ~15deg DF on RLE. 4/27: LLE -9 PROM RLE able to AROM to -2 to neutral  with inversion    Time 8    Period Weeks    Status Partially Met    Target Date 10/11/20      PT LONG TERM GOAL #4   Title Patient will be independent with gym program for total body strengthening.    Baseline 4/27: introducing dumbbells    Time 8    Period Weeks    Status New    Target Date 10/11/20      PT LONG TERM GOAL #5   Title Patient will increase FOTO score to equal to or greater than 60%    to demonstrate statistically significant improvement in mobility and quality of life.    Baseline 4/27: 42%    Time 8    Period Weeks    Status New    Target Date 10/11/20                   Plan - 10/09/20 0915     Clinical Impression Statement Patient has increased tone initially this session but improves throughout session duration. His standing continues to require blocking of L knee more than R as RLE is increasing with strength. Prolonged standing is fatiguing to patient requiring rest breaks after each one. Pt will continue to benefit from skilled therapy in order to address remaining goals/deficits still present at this time     Personal Factors and Comorbidities Comorbidity 3+;Time since onset of injury/illness/exacerbation    Comorbidities HIV, Urinary retention with bladder stretch injurt, CNS toxoplasmosis (CMS-HCC), Paraplegia, incomplete, diabetic    Examination-Activity Limitations Bathing    Examination-Participation Restrictions Driving    Stability/Clinical Decision Making Evolving/Moderate complexity    Rehab Potential Fair    PT Frequency 2x / week    PT Duration 8 weeks    PT Treatment/Interventions ADLs/Self Care Home Management;Electrical Stimulation;Moist Heat;Gait training;Stair training;Functional mobility training;Therapeutic activities;Therapeutic exercise;Balance training;Neuromuscular re-education;Wheelchair mobility training;Manual techniques;Passive range of motion;Energy conservation    PT Next Visit Plan Continue with progressive LE strengthening in supine/seated position and continue with Standing balance/endurance in Parallel bars.    PT Home Exercise Plan No updates this session;    Consulted and Agree with Plan of Care Patient             Patient will benefit from skilled therapeutic intervention in order to improve the following deficits and impairments:  Decreased range of motion, Decreased strength, Impaired flexibility, Difficulty walking, Decreased mobility, Decreased activity tolerance, Decreased endurance, Impaired sensation, Decreased balance, Impaired tone  Visit Diagnosis: Difficulty in walking, not elsewhere classified  Muscle weakness (generalized)  Unsteadiness on feet     Problem List There are no problems to display for this patient.  Janna Arch, PT, DPT  10/09/2020, 9:29 AM  [place] MAIN [facility] SERVICES [address] [place], [place], [address] Phone: [phone]   Fax:  [phone]  Name: Remiel Corti  Pricilla Holm MRN: [id number] Date of Birth: 12/24/1975

## 2020-10-11 ENCOUNTER — Other Ambulatory Visit: Payer: Self-pay

## 2020-10-11 ENCOUNTER — Ambulatory Visit: Payer: 59

## 2020-10-11 DIAGNOSIS — M6281 Muscle weakness (generalized): Secondary | ICD-10-CM

## 2020-10-11 DIAGNOSIS — R2681 Unsteadiness on feet: Secondary | ICD-10-CM | POA: Diagnosis not present

## 2020-10-11 DIAGNOSIS — R262 Difficulty in walking, not elsewhere classified: Secondary | ICD-10-CM | POA: Diagnosis not present

## 2020-10-11 NOTE — Therapy (Signed)
[place] MAIN [facility] SERVICES [address] [place], [place], [address] Phone: [phone]   Fax:  [phone]  Physical Therapy Treatment /RECERT  Patient Details  Name: Ryan Bush MRN: [id number] Date of Birth: [birth year] Referring Provider (PT): Clarise Cruz   Encounter Date: 10/11/2020   PT End of Session - 10/11/20 0932     Visit Number 38    Number of Visits 62    Date for PT Re-Evaluation 01/03/21    Authorization Type [employer]    Authorization Time Period 06/19/20-08/14/20; 7/10 PN 5/9    PT Start Time 0844    PT Stop Time 0930    PT Time Calculation (min) 46 min    Equipment Utilized During Treatment Gait belt    Activity Tolerance Patient tolerated treatment well;Patient limited by fatigue;No increased pain    Behavior During Therapy WFL for tasks assessed/performed             Past Medical History:  Diagnosis Date   HIV (human immunodeficiency virus infection) ([place])     History reviewed. No pertinent surgical history.  There were no vitals filed for this visit.   Subjective Assessment - 10/11/20 0846     Subjective Patient reports he is doing well, is aware today is goal day. No falls or LOB since last session. Has been compliant with HEP.    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 Patient had PT and OT in the hospital for 3 months. He was DC from PT due to his insurance running out and he needed a different kind of PT. He was discharged home and he had HHPT 2 x week for 1 month. He was dischaged from [place] and was supposed to get out patient PT but his insurance was cancelled. He has a new insurance now and has [place].    Limitations Standing;Walking    How long can you sit comfortably? unlimited    How long can you stand comfortably? 1-3 mins with BUE support    How long can you walk  comfortably? unable    Currently in Pain? No/denies                 Goals:  HEP: compliant  Stand in // bars 60 seconds: 62 seconds BLE strength   Right Left  Hip flexion 3 3-  Hip Abduction 4- 2  Hip Adduction 3+ 2  Knee Extension  3 2  Knee Flexion 3 2  DF 2+ 2  PF 2+ 2    Joint contractures in ankle: able to obtain neutral with PROM  Independent in gym program  FOTO: 34%    New goal: Patient will complete sit <> stand transfer with Mod I and LRAD to improve functional independence for household and community mobility -able to perform with stabilization to knees in // bars   Patient will ambulate at least 3 feet with min A using LRAD to improve mobility for household and community distances -unable to ambulate   Treatment:  Supine: -hamstring bent knee with leg on PT shoulder 60 seconds x1 trial each LE; deep pressure applied for tension/tone reduction each LE. ; reports LLE more affected than RLE - posterior calf stretch with progressive DF 60 second holds x1 trial each LE. -single knee to chest with progressive hold x 60 each LE -piriformis stretch, stabilization to limb provided with progressive  lengthening x60 second holds   Standing in // bars:  -partial sit to stand 10x stabilization to knees provided with cues for weightbearing through LE's; 2 sets    standing with x min/mod assist, stabilizing bilateral knees and feet, , close CGA: static stand  x5 trials cues for weight acceptance through heels, trunk positioning etc.         -lateral weight shift 30 seconds x4 trials; cues for breathing  Seated: GTB row with cues for exhale during concentric contraction for diaphragmatic control 20x AAROM LAQ 10x each LE      Pt educated throughout session about proper posture and technique with exercises. Improved exercise technique, movement at target joints, use of target muscles after min to mod verbal, visual, tactile cues   Patient is progressing with  standing, is able to perform with blocking of LE's only. Due to patient neurologic condition he will benefit from POC of 12 weeks rather than 8 weeks. New goal addressing sit to stand transfer as well as ability to take a step will be added. Patient's RLE has progressed in strength greatly since coming to therapy, with left still affected at this time. Neutral ankle alignment able to be reached with overpressure. Pt will continue to benefit from skilled therapy in order to address remaining goals/deficits still present at this time             PT Education - 10/11/20 0839     Education Details goals, POC    Person(s) Educated Patient    Methods Explanation;Demonstration;Tactile cues;Verbal cues    Comprehension Verbalized understanding;Returned demonstration;Verbal cues required;Tactile cues required              PT Short Term Goals - 10/11/20 0941       PT SHORT TERM GOAL #1   Title Patient will be independent in home exercise program to improve strength/mobility for better functional independence with ADLs.    Baseline 06/12/20  pt performing HEP 1x/day. 3/30: performing 1x/day, interested in gym membership 4/27: progressing HEP 6/22: HEP compliant    Time 4    Period Weeks    Status Achieved    Target Date 09/13/20      PT SHORT TERM GOAL #2   Title Patient will be able to stand in parallel bars with support of BUE for 30 seconds with adequate weight bearing through BLE.    Baseline 01/03: less than 30s, 06/12/20  pt able to stand in parallel bars 3 mins with min to mod A cueing for posture and knee blocking.    Time 4    Period Weeks    Status Achieved    Target Date 07/10/20               PT Long Term Goals - 10/11/20 0941       PT LONG TERM GOAL #1   Title Patient will be able to stand in parallel bars with support of BUE for 30-60 seconds with CGA with adequate weight bearing through BLE.    Baseline 01/03: less than 30s, 06/12/20  pt able to stand in  parallel bars 3 mins with min to mod A cueing for posture and knee blocking. 3/30: 60s with mod-max A +2 with blocking bil feet/knees and TC for quad mm activation 4/27: 1 min 15 seconds with min mod blocking of knees 6/22: 62 seconds with blocking of LE's only    Time 12    Period Weeks    Status Partially Met  Target Date 01/03/21      PT LONG TERM GOAL #2   Title Patient will increase BLE gross strength half a grade as to improve functional strength for increased standing tolerance and increased ADL ability.    Baseline 06/12/20 requires max assist for most movements impacting left LE, right min to mod assist depending on muscle group. 3/30: R hip flex, knee ext 3-/5, requires max A for LLE mvmt 4/27: R hip flex 3 knee extension 3- L trace 6/22: see note    Time 12    Period Weeks    Status Partially Met    Target Date 01/03/21      PT LONG TERM GOAL #3   Title Patient will reduce joint contracture in BLE ankle plantarflexion by being able to achieve ankle dorsiflexion to neutral for better functional mobility    Baseline 01/03: RLE patient actively moves it by 4 degrees, unable to actively move LLE into DF, 06/12/20  arom into right DF 6 degrees, left unable, trace contraction dorsal foot with DF attempts on left. 3/30: continues to be limited with achieving neutral, but demo improvements with PROM s/p MT on LLE (~3deg improvement), lacking ~15deg DF on RLE. 4/27: LLE -9 PROM RLE able to AROM to -2 to neutral  with inversion 6/22: able to obtain neutral with overpressure    Time 8    Period Weeks    Status Achieved      PT LONG TERM GOAL #4   Title Patient will be independent with gym program for total body strengthening.    Baseline 4/27: introducing dumbbells 6/22: doing some dumbbell exercises at home.    Time 12    Period Weeks    Status Partially Met    Target Date 01/03/21      PT LONG TERM GOAL #5   Title Patient will increase FOTO score to equal to or greater than 60%    to  demonstrate statistically significant improvement in mobility and quality of life.    Baseline 4/27: 42% 6/22: 34%    Time 12    Period Weeks    Status On-going    Target Date 01/03/21      Additional Long Term Goals   Additional Long Term Goals Yes      PT LONG TERM GOAL #6   Title Patient will complete sit <> stand transfer with Mod I and LRAD to improve functional independence for household and community mobility    Baseline able to perform with stabilization to knees in // bars    Time 12    Period Weeks    Status New    Target Date 01/03/21      PT LONG TERM GOAL #7   Title Patient will ambulate at least 3 feet with min A using LRAD to improve mobility for household and community distances    Baseline -unable to ambulate    Time 12    Period Weeks    Status New    Target Date 01/03/21                   Plan - 10/11/20 0945     Clinical Impression Statement Patient is progressing with standing, is able to perform with blocking of LE's only. Due to patient neurologic condition he will benefit from POC of 12 weeks rather than 8 weeks. New goal addressing sit to stand transfer as well as ability to take a step will be added. Patient's RLE has  progressed in strength greatly since coming to therapy, with left still affected at this time. Neutral ankle alignment able to be reached with overpressure. Pt will continue to benefit from skilled therapy in order to address remaining goals/deficits still present at this time    Personal Factors and Comorbidities Comorbidity 3+;Time since onset of injury/illness/exacerbation    Comorbidities HIV, Urinary retention with bladder stretch injurt, CNS toxoplasmosis (CMS-HCC), Paraplegia, incomplete, diabetic    Examination-Activity Limitations Bathing    Examination-Participation Restrictions Driving    Stability/Clinical Decision Making Evolving/Moderate complexity    Rehab Potential Fair    PT Frequency 2x / week    PT Duration 12  weeks    PT Treatment/Interventions ADLs/Self Care Home Management;Electrical Stimulation;Moist Heat;Gait training;Stair training;Functional mobility training;Therapeutic activities;Therapeutic exercise;Balance training;Neuromuscular re-education;Wheelchair mobility training;Manual techniques;Passive range of motion;Energy conservation;Orthotic Fit/Training;Biofeedback;Cryotherapy;Ultrasound;DME Instruction;Dry needling;Vestibular;Splinting;Patient/family education;Visual/perceptual remediation/compensation    PT Next Visit Plan Continue with progressive LE strengthening in supine/seated position and continue with Standing balance/endurance in Parallel bars.    PT Home Exercise Plan No updates this session;    Consulted and Agree with Plan of Care Patient             Patient will benefit from skilled therapeutic intervention in order to improve the following deficits and impairments:  Decreased range of motion, Decreased strength, Impaired flexibility, Difficulty walking, Decreased mobility, Decreased activity tolerance, Decreased endurance, Impaired sensation, Decreased balance, Impaired tone  Visit Diagnosis: Difficulty in walking, not elsewhere classified  Muscle weakness (generalized)  Unsteadiness on feet     Problem List There are no problems to display for this patient.   Janna Arch, PT, DPT  10/11/2020, 9:47 AM  [place] MAIN [facility] SERVICES [address] [place], [place], [address] Phone: [phone]   Fax:  [phone]  Name: Ryan Bush MRN: [id number] Date of Birth: 1976/04/19

## 2020-10-12 ENCOUNTER — Ambulatory Visit (LOCAL_COMMUNITY_HEALTH_CENTER): Payer: 59

## 2020-10-12 DIAGNOSIS — Z111 Encounter for screening for respiratory tuberculosis: Secondary | ICD-10-CM

## 2020-10-12 NOTE — Progress Notes (Signed)
Wife currently in [facility] hospital with covid and is TB suspect. In clinic today for QFT testing with children. No sx's of TB Richmond Campbell, RN   No charge QFT

## 2020-10-16 ENCOUNTER — Other Ambulatory Visit: Payer: Self-pay

## 2020-10-16 ENCOUNTER — Ambulatory Visit: Payer: 59

## 2020-10-16 DIAGNOSIS — R2681 Unsteadiness on feet: Secondary | ICD-10-CM

## 2020-10-16 DIAGNOSIS — M6281 Muscle weakness (generalized): Secondary | ICD-10-CM | POA: Diagnosis not present

## 2020-10-16 DIAGNOSIS — R262 Difficulty in walking, not elsewhere classified: Secondary | ICD-10-CM

## 2020-10-16 NOTE — Therapy (Signed)
[place] MAIN [facility] SERVICES [address] [place], [place], [address] Phone: [phone]   Fax:  [phone]  Physical Therapy Treatment  Patient Details  Name: Ryan Bush MRN: [id number] Date of Birth: January 25, 1976 Referring Provider (PT): Clarise Cruz   Encounter Date: 10/16/2020   PT End of Session - 10/16/20 0940     Visit Number 39    Number of Visits 62    Date for PT Re-Evaluation 01/03/21    Authorization Type [employer]    Authorization Time Period 06/19/20-08/14/20; 9/10 PN 5/9    PT Start Time 0845    PT Stop Time 0931    PT Time Calculation (min) 46 min    Equipment Utilized During Treatment Gait belt    Activity Tolerance Patient tolerated treatment well;Patient limited by fatigue;No increased pain    Behavior During Therapy WFL for tasks assessed/performed             Past Medical History:  Diagnosis Date   HIV (human immunodeficiency virus infection) ([place])     History reviewed. No pertinent surgical history.  There were no vitals filed for this visit.   Subjective Assessment - 10/16/20 0938     Subjective Patient reports he is doing ok. Having some financial issues due to wife's hospitlization.    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 Patient had PT and OT in the hospital for 3 months. He was DC from PT due to his insurance running out and he needed a different kind of PT. He was discharged home and he had HHPT 2 x week for 1 month. He was dischaged from [place] and was supposed to get out patient PT but his insurance was cancelled. He has a new insurance now and has [place].    Limitations Standing;Walking    How long can you sit comfortably? unlimited    How long can you stand comfortably? 1-3 mins with BUE support    How long can you walk comfortably? unable    Currently in Pain?  No/denies                Treatment: Interpreter: Ryan Bush    Supine: -hamstring bent knee with leg on PT shoulder 60 seconds x1 trial each LE; deep pressure applied for tension/tone reduction each LE. ; reports LLE more affected than RLE - posterior calf stretch with progressive DF 60 second holds x1 trial each LE. -single knee to chest with progressive hold x 60 each LE -piriformis stretch, stabilization to limb provided with progressive lengthening x60 second holds  -bridge 10x with arms crossed -Heel slide LLE AAROM 10x, RLE AROM 10x   Sidelying: Hip extension 10x each LE, AAROM bilateral LE with decreased assistance required RLE Clamshells 10x each LE    Standing in // bars:  -partial sit to stand 10x stabilization to knees provided with cues for weightbearing through LE's; 2 sets    standing with x min/mod assist, stabilizing bilateral knees and feet, , close CGA: static stand  x5 trials cues for weight acceptance through heels, trunk positioning etc.         -lateral weight shift 30 seconds x2 trials     seated: Hamstring isometric into dynadisc 10x each LE 5 second holds RTB adduction into band ; 10x; cues for exhale with concentric movement; very challenging for patient  Pt educated throughout session about proper posture and technique with exercises. Improved exercise technique, movement at target joints, use of target muscles after min to mod verbal, visual, tactile cues   Patient is highly motivated throughout session. Given information on programs available in area to assist him due to current financial difficulties. Patient continues to require education on pairing breathing techniques with exercise for optimal performance. It is more challenging for his left side due to increased demand on system. Pt will continue to benefit from skilled therapy in order to address remaining goals/deficits still present at this time                    PT  Education - 10/16/20 0940     Education Details exercise technique, website for assistance    Person(s) Educated Patient    Methods Explanation;Demonstration;Tactile cues;Verbal cues    Comprehension Verbalized understanding;Returned demonstration;Verbal cues required              PT Short Term Goals - 10/11/20 0941       PT SHORT TERM GOAL #1   Title Patient will be independent in home exercise program to improve strength/mobility for better functional independence with ADLs.    Baseline 06/12/20  pt performing HEP 1x/day. 3/30: performing 1x/day, interested in gym membership 4/27: progressing HEP 6/22: HEP compliant    Time 4    Period Weeks    Status Achieved    Target Date 09/13/20      PT SHORT TERM GOAL #2   Title Patient will be able to stand in parallel bars with support of BUE for 30 seconds with adequate weight bearing through BLE.    Baseline 01/03: less than 30s, 06/12/20  pt able to stand in parallel bars 3 mins with min to mod A cueing for posture and knee blocking.    Time 4    Period Weeks    Status Achieved    Target Date 07/10/20               PT Long Term Goals - 10/11/20 0941       PT LONG TERM GOAL #1   Title Patient will be able to stand in parallel bars with support of BUE for 30-60 seconds with CGA with adequate weight bearing through BLE.    Baseline 01/03: less than 30s, 06/12/20  pt able to stand in parallel bars 3 mins with min to mod A cueing for posture and knee blocking. 3/30: 60s with mod-max A +2 with blocking bil feet/knees and TC for quad mm activation 4/27: 1 min 15 seconds with min mod blocking of knees 6/22: 62 seconds with blocking of LE's only    Time 12    Period Weeks    Status Partially Met    Target Date 01/03/21      PT LONG TERM GOAL #2   Title Patient will increase BLE gross strength half a grade as to improve functional strength for increased standing tolerance and increased ADL ability.    Baseline 06/12/20 requires max  assist for most movements impacting left LE, right min to mod assist depending on muscle group. 3/30: R hip flex, knee ext 3-/5, requires max A for LLE mvmt 4/27: R hip flex 3 knee extension 3- L trace 6/22: see note    Time 12    Period Weeks    Status Partially Met    Target Date 01/03/21      PT LONG TERM  GOAL #3   Title Patient will reduce joint contracture in BLE ankle plantarflexion by being able to achieve ankle dorsiflexion to neutral for better functional mobility    Baseline 01/03: RLE patient actively moves it by 4 degrees, unable to actively move LLE into DF, 06/12/20  arom into right DF 6 degrees, left unable, trace contraction dorsal foot with DF attempts on left. 3/30: continues to be limited with achieving neutral, but demo improvements with PROM s/p MT on LLE (~3deg improvement), lacking ~15deg DF on RLE. 4/27: LLE -9 PROM RLE able to AROM to -2 to neutral  with inversion 6/22: able to obtain neutral with overpressure    Time 8    Period Weeks    Status Achieved      PT LONG TERM GOAL #4   Title Patient will be independent with gym program for total body strengthening.    Baseline 4/27: introducing dumbbells 6/22: doing some dumbbell exercises at home.    Time 12    Period Weeks    Status Partially Met    Target Date 01/03/21      PT LONG TERM GOAL #5   Title Patient will increase FOTO score to equal to or greater than 60%    to demonstrate statistically significant improvement in mobility and quality of life.    Baseline 4/27: 42% 6/22: 34%    Time 12    Period Weeks    Status On-going    Target Date 01/03/21      Additional Long Term Goals   Additional Long Term Goals Yes      PT LONG TERM GOAL #6   Title Patient will complete sit <> stand transfer with Mod I and LRAD to improve functional independence for household and community mobility    Baseline able to perform with stabilization to knees in // bars    Time 12    Period Weeks    Status New    Target Date  01/03/21      PT LONG TERM GOAL #7   Title Patient will ambulate at least 3 feet with min A using LRAD to improve mobility for household and community distances    Baseline -unable to ambulate    Time 12    Period Weeks    Status New    Target Date 01/03/21                   Plan - 10/16/20 0943     Clinical Impression Statement Patient is highly motivated throughout session. Given information on programs available in area to assist him due to current financial difficulties. Patient continues to require education on pairing breathing techniques with exercise for optimal performance. It is more challenging for his left side due to increased demand on system. Pt will continue to benefit from skilled therapy in order to address remaining goals/deficits still present at this time    Personal Factors and Comorbidities Comorbidity 3+;Time since onset of injury/illness/exacerbation    Comorbidities HIV, Urinary retention with bladder stretch injurt, CNS toxoplasmosis (CMS-HCC), Paraplegia, incomplete, diabetic    Examination-Activity Limitations Bathing    Examination-Participation Restrictions Driving    Stability/Clinical Decision Making Evolving/Moderate complexity    Rehab Potential Fair    PT Frequency 2x / week    PT Duration 12 weeks    PT Treatment/Interventions ADLs/Self Care Home Management;Electrical Stimulation;Moist Heat;Gait training;Stair training;Functional mobility training;Therapeutic activities;Therapeutic exercise;Balance training;Neuromuscular re-education;Wheelchair mobility training;Manual techniques;Passive range of motion;Energy conservation;Orthotic Fit/Training;Biofeedback;Cryotherapy;Ultrasound;DME Instruction;Dry needling;Vestibular;Splinting;Patient/family education;Visual/perceptual  remediation/compensation    PT Next Visit Plan Continue with progressive LE strengthening in supine/seated position and continue with Standing balance/endurance in Parallel bars.     PT Home Exercise Plan No updates this session;    Consulted and Agree with Plan of Care Patient             Patient will benefit from skilled therapeutic intervention in order to improve the following deficits and impairments:  Decreased range of motion, Decreased strength, Impaired flexibility, Difficulty walking, Decreased mobility, Decreased activity tolerance, Decreased endurance, Impaired sensation, Decreased balance, Impaired tone  Visit Diagnosis: Difficulty in walking, not elsewhere classified  Unsteadiness on feet  Muscle weakness (generalized)     Problem List There are no problems to display for this patient.   Janna Arch, PT, DPT  10/16/2020, 9:45 AM  [place] MAIN [facility] SERVICES [address] [place], [place], [address] Phone: [phone]   Fax:  [phone]  Name: Ryan Bush MRN: [id number] Date of Birth: 07-05-1975

## 2020-10-17 LAB — QUANTIFERON-TB GOLD PLUS
QuantiFERON Mitogen Value: >10 IU/mL
QuantiFERON Nil Value: 0.1 IU/mL
QuantiFERON TB1 Ag Value: 0 IU/mL
QuantiFERON TB2 Ag Value: 0.01 IU/mL
QuantiFERON-TB Gold Plus: NEGATIVE

## 2020-10-18 ENCOUNTER — Ambulatory Visit: Payer: 59

## 2020-10-18 ENCOUNTER — Other Ambulatory Visit: Payer: Self-pay

## 2020-10-18 DIAGNOSIS — M6281 Muscle weakness (generalized): Secondary | ICD-10-CM

## 2020-10-18 DIAGNOSIS — R262 Difficulty in walking, not elsewhere classified: Secondary | ICD-10-CM

## 2020-10-18 DIAGNOSIS — R2681 Unsteadiness on feet: Secondary | ICD-10-CM

## 2020-10-18 NOTE — Therapy (Signed)
[place] MAIN [facility] SERVICES [address] [place], [place], [address] Phone: [phone]   Fax:  [phone]  Physical Therapy Treatment/ Physical Therapy Progress Note   Dates of reporting period  08/28/20   to   10/18/20   Patient Details  Name: Ryan Bush MRN: [id number] Date of Birth: 16-Jul-1975 Referring Provider (PT): Clarise Cruz   Encounter Date: 10/18/2020   PT End of Session - 10/18/20 1230     Visit Number 40    Number of Visits 37    Date for PT Re-Evaluation 01/03/21    Authorization Type [employer]    Authorization Time Period 06/19/20-08/14/20; 10/10 PN 5/9; next sessoin 1/10 Pn 6/29    PT Start Time 0845    PT Stop Time 0931    PT Time Calculation (min) 46 min    Equipment Utilized During Treatment Gait belt    Activity Tolerance Patient tolerated treatment well;Patient limited by fatigue;No increased pain    Behavior During Therapy WFL for tasks assessed/performed             Past Medical History:  Diagnosis Date   HIV (human immunodeficiency virus infection) ([place])     History reviewed. No pertinent surgical history.  There were no vitals filed for this visit.   Subjective Assessment - 10/18/20 1228     Subjective Patient reports his wife has been sent bac to [facility] due to her COVID illness. Has been challenging at home taking care of everything without his wife.    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 Patient had PT and OT in the hospital for 3 months. He was DC from PT due to his insurance running out and he needed a different kind of PT. He was discharged home and he had HHPT 2 x week for 1 month. He was dischaged from [place] and was supposed to get out patient PT but his insurance was cancelled. He has a new insurance now and has [place].    Limitations Standing;Walking    How long  can you sit comfortably? unlimited    How long can you stand comfortably? 1-3 mins with BUE support    How long can you walk comfortably? unable    Currently in Pain? No/denies                     Progress note, goals performed 6/22, please refer to this note for further details   Treatment: Interpreter: [name]    Supine: -hamstring bent knee with leg on PT shoulder 60 seconds x1 trial each LE; deep pressure applied for tension/tone reduction each LE. ; reports LLE more affected than RLE - posterior calf stretch with progressive DF 60 second holds x1 trial each LE. -single knee to chest with progressive hold x 60 each LE -piriformis stretch, stabilization to limb provided with progressive lengthening x60 second holds  -bridge 10x with arms crossed -Heel slide LLE AAROM 10x, RLE AROM 10x   Sidelying: Hip extension 10x each LE, AAROM bilateral LE with decreased assistance required RLE Clamshells 10x each LE    Standing  knee immobilizers donned in supine position, transition to sitting EOB with Min A .  STS from raised plinth table with standard walker. Upon standing cue for step back ; able to perform with each LE able to self place leg. Cues for reducing demand  on arms allowing for LE's to assist in stabilizing self. X4 trials. Able to step back x 4 steps each stand. Min A to return to seated position due to inability to bend knees with immobilizers on.     Pt educated throughout session about proper posture and technique with exercises. Improved exercise technique, movement at target joints, use of target muscles after min to mod verbal, visual, tactile cues  Patient's condition has the potential to improve in response to therapy. Maximum improvement is yet to be obtained. The anticipated improvement is attainable and reasonable in a generally predictable time.  Patient reports he is getting stronger, especially in his right leg. Wants to be able to stand and take steps.      Patient's goals performed 6/22, please refer to this note for further details. Patient was introduced to knee immobilizer for first time this session. Was able to stand without support with walker as well as move legs independently without assistance for first time. Patient's condition has the potential to improve in response to therapy. Maximum improvement is yet to be obtained. The anticipated improvement is attainable and reasonable in a generally predictable time. Pt will continue to benefit from skilled therapy in order to address remaining goals/deficits still present at this time          PT Education - 10/18/20 1229     Education Details exercise technique, use immobilizer    Person(s) Educated Patient    Methods Explanation;Demonstration;Tactile cues;Verbal cues    Comprehension Verbalized understanding;Returned demonstration;Verbal cues required;Tactile cues required              PT Short Term Goals - 10/11/20 0941       PT SHORT TERM GOAL #1   Title Patient will be independent in home exercise program to improve strength/mobility for better functional independence with ADLs.    Baseline 06/12/20  pt performing HEP 1x/day. 3/30: performing 1x/day, interested in gym membership 4/27: progressing HEP 6/22: HEP compliant    Time 4    Period Weeks    Status Achieved    Target Date 09/13/20      PT SHORT TERM GOAL #2   Title Patient will be able to stand in parallel bars with support of BUE for 30 seconds with adequate weight bearing through BLE.    Baseline 01/03: less than 30s, 06/12/20  pt able to stand in parallel bars 3 mins with min to mod A cueing for posture and knee blocking.    Time 4    Period Weeks    Status Achieved    Target Date 07/10/20               PT Long Term Goals - 10/11/20 0941       PT LONG TERM GOAL #1   Title Patient will be able to stand in parallel bars with support of BUE for 30-60 seconds with CGA with adequate weight bearing  through BLE.    Baseline 01/03: less than 30s, 06/12/20  pt able to stand in parallel bars 3 mins with min to mod A cueing for posture and knee blocking. 3/30: 60s with mod-max A +2 with blocking bil feet/knees and TC for quad mm activation 4/27: 1 min 15 seconds with min mod blocking of knees 6/22: 62 seconds with blocking of LE's only    Time 12    Period Weeks    Status Partially Met    Target Date 01/03/21  PT LONG TERM GOAL #2   Title Patient will increase BLE gross strength half a grade as to improve functional strength for increased standing tolerance and increased ADL ability.    Baseline 06/12/20 requires max assist for most movements impacting left LE, right min to mod assist depending on muscle group. 3/30: R hip flex, knee ext 3-/5, requires max A for LLE mvmt 4/27: R hip flex 3 knee extension 3- L trace 6/22: see note    Time 12    Period Weeks    Status Partially Met    Target Date 01/03/21      PT LONG TERM GOAL #3   Title Patient will reduce joint contracture in BLE ankle plantarflexion by being able to achieve ankle dorsiflexion to neutral for better functional mobility    Baseline 01/03: RLE patient actively moves it by 4 degrees, unable to actively move LLE into DF, 06/12/20  arom into right DF 6 degrees, left unable, trace contraction dorsal foot with DF attempts on left. 3/30: continues to be limited with achieving neutral, but demo improvements with PROM s/p MT on LLE (~3deg improvement), lacking ~15deg DF on RLE. 4/27: LLE -9 PROM RLE able to AROM to -2 to neutral  with inversion 6/22: able to obtain neutral with overpressure    Time 8    Period Weeks    Status Achieved      PT LONG TERM GOAL #4   Title Patient will be independent with gym program for total body strengthening.    Baseline 4/27: introducing dumbbells 6/22: doing some dumbbell exercises at home.    Time 12    Period Weeks    Status Partially Met    Target Date 01/03/21      PT LONG TERM GOAL #5    Title Patient will increase FOTO score to equal to or greater than 60%    to demonstrate statistically significant improvement in mobility and quality of life.    Baseline 4/27: 42% 6/22: 34%    Time 12    Period Weeks    Status On-going    Target Date 01/03/21      Additional Long Term Goals   Additional Long Term Goals Yes      PT LONG TERM GOAL #6   Title Patient will complete sit <> stand transfer with Mod I and LRAD to improve functional independence for household and community mobility    Baseline able to perform with stabilization to knees in // bars    Time 12    Period Weeks    Status New    Target Date 01/03/21      PT LONG TERM GOAL #7   Title Patient will ambulate at least 3 feet with min A using LRAD to improve mobility for household and community distances    Baseline -unable to ambulate    Time 12    Period Weeks    Status New    Target Date 01/03/21                   Plan - 10/18/20 1233     Clinical Impression Statement Patient's goals performed 6/22, please refer to this note for further details. Patient was introduced to knee immobilizer for first time this session. Was able to stand without support with walker as well as move legs independently without assistance for first time. Patient's condition has the potential to improve in response to therapy. Maximum improvement is yet to be obtained.  The anticipated improvement is attainable and reasonable in a generally predictable time. Pt will continue to benefit from skilled therapy in order to address remaining goals/deficits still present at this time    Personal Factors and Comorbidities Comorbidity 3+;Time since onset of injury/illness/exacerbation    Comorbidities HIV, Urinary retention with bladder stretch injurt, CNS toxoplasmosis (CMS-HCC), Paraplegia, incomplete, diabetic    Examination-Activity Limitations Bathing    Examination-Participation Restrictions Driving    Stability/Clinical Decision  Making Evolving/Moderate complexity    Rehab Potential Fair    PT Frequency 2x / week    PT Duration 12 weeks    PT Treatment/Interventions ADLs/Self Care Home Management;Electrical Stimulation;Moist Heat;Gait training;Stair training;Functional mobility training;Therapeutic activities;Therapeutic exercise;Balance training;Neuromuscular re-education;Wheelchair mobility training;Manual techniques;Passive range of motion;Energy conservation;Orthotic Fit/Training;Biofeedback;Cryotherapy;Ultrasound;DME Instruction;Dry needling;Vestibular;Splinting;Patient/family education;Visual/perceptual remediation/compensation    PT Next Visit Plan Continue with progressive LE strengthening in supine/seated position and continue with Standing balance/endurance in Parallel bars.    PT Home Exercise Plan No updates this session;    Consulted and Agree with Plan of Care Patient             Patient will benefit from skilled therapeutic intervention in order to improve the following deficits and impairments:  Decreased range of motion, Decreased strength, Impaired flexibility, Difficulty walking, Decreased mobility, Decreased activity tolerance, Decreased endurance, Impaired sensation, Decreased balance, Impaired tone  Visit Diagnosis: Difficulty in walking, not elsewhere classified  Unsteadiness on feet  Muscle weakness (generalized)     Problem List There are no problems to display for this patient.  Janna Arch, PT, DPT  10/18/2020, 12:34 PM  [place] MAIN [facility] SERVICES [address] [place], [place], [address] Phone: [phone]   Fax:  [phone]  Name: Alejo Beamer MRN: [id number] Date of Birth: 02-22-1976

## 2020-10-25 ENCOUNTER — Ambulatory Visit: Payer: 59 | Attending: Physical Medicine and Rehabilitation

## 2020-10-25 ENCOUNTER — Other Ambulatory Visit: Payer: Self-pay

## 2020-10-25 DIAGNOSIS — G8222 Paraplegia, incomplete: Secondary | ICD-10-CM | POA: Insufficient documentation

## 2020-10-25 DIAGNOSIS — M6281 Muscle weakness (generalized): Secondary | ICD-10-CM | POA: Insufficient documentation

## 2020-10-25 DIAGNOSIS — R262 Difficulty in walking, not elsewhere classified: Secondary | ICD-10-CM

## 2020-10-25 DIAGNOSIS — R2681 Unsteadiness on feet: Secondary | ICD-10-CM

## 2020-10-25 DIAGNOSIS — R339 Retention of urine, unspecified: Secondary | ICD-10-CM | POA: Diagnosis not present

## 2020-10-25 NOTE — Therapy (Signed)
[place] MAIN [facility] SERVICES [address] [place], [place], [address] Phone: [phone]   Fax:  [phone]  Physical Therapy Treatment  Patient Details  Name: Ryan Bush MRN: [id number] Date of Birth: 24-Nov-1975 Referring Provider (PT): Clarise Cruz   Encounter Date: 10/25/2020   PT End of Session - 10/25/20 0920     Visit Number 41    Number of Visits 62    Date for PT Re-Evaluation 01/03/21    Authorization Type [employer]    Authorization Time Period 06/19/20-08/14/20; 1/10 Pn 6/29    PT Start Time 0845    PT Stop Time 0929    PT Time Calculation (min) 44 min    Equipment Utilized During Treatment Gait belt    Activity Tolerance Patient tolerated treatment well;Patient limited by fatigue;No increased pain    Behavior During Therapy WFL for tasks assessed/performed             Past Medical History:  Diagnosis Date   HIV (human immunodeficiency virus infection) ([place])     History reviewed. No pertinent surgical history.  There were no vitals filed for this visit.   Subjective Assessment - 10/25/20 0917     Subjective Patient reports he did well after last sessoin. His wife is still in the hospital. no falls or LOB since last session.    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 Patient had PT and OT in the hospital for 3 months. He was DC from PT due to his insurance running out and he needed a different kind of PT. He was discharged home and he had HHPT 2 x week for 1 month. He was dischaged from [place] and was supposed to get out patient PT but his insurance was cancelled. He has a new insurance now and has [place].    Limitations Standing;Walking    How long can you sit comfortably? unlimited    How long can you stand comfortably? 1-3 mins with BUE support    How long can you walk comfortably? unable     Currently in Pain? No/denies                     Supine: -hamstring bent knee with leg on PT shoulder 60 seconds x1 trial each LE; deep pressure applied for tension/tone reduction each LE. ; reports LLE more affected than RLE - posterior calf stretch with progressive DF 60 second holds x1 trial each LE. -single knee to chest with progressive hold x 60 each LE -piriformis stretch, stabilization to limb provided with progressive lengthening x60 second holds  -bridge 10x with arms crossed -Heel slide LLE AAROM 10x, RLE AROM 10x   Sidelying: Hip extension 10x each LE, AAROM bilateral LE with decreased assistance required RLE Clamshells 10x each LE    Standing  knee immobilizers donned in supine position, transition to sitting EOB with Min A . STS from raised plinth table with standard walker. Upon standing cue for step back ; able to perform with each LE able to self place leg. Cues for reducing demand on arms allowing for LE's to assist in stabilizing self. X3 trials. Able to step back x 4 steps each stand. Min A to return to seated position due to inability to bend knees with immobilizers on.    Nustep Lvl 0-1 with knee stabilizers on: seat position 9; 5  minutes for cardiovascular and musculoskeletal challenge at end of session. Frequent assistance for foot placement and cues for breathing techniques.     Pt educated throughout session about proper posture and technique with exercises. Improved exercise technique, movement at target joints, use of target muscles after min to mod verbal, visual, tactile cues    Patient is highly motivated throughout session. Utilization of knee immobilizers allows from standing from raised plinth table without stabilization to knees. He is able to take small steps forward and backwards with RLE but only backwards with LLE. Pt will continue to benefit from skilled therapy in order to address remaining goals/deficits still present at this  time                  PT Education - 10/25/20 0919     Education Details exercise technique, body mechanics    Person(s) Educated Patient    Methods Explanation;Demonstration;Tactile cues;Verbal cues    Comprehension Verbalized understanding;Returned demonstration;Verbal cues required;Tactile cues required              PT Short Term Goals - 10/11/20 0941       PT SHORT TERM GOAL #1   Title Patient will be independent in home exercise program to improve strength/mobility for better functional independence with ADLs.    Baseline 06/12/20  pt performing HEP 1x/day. 3/30: performing 1x/day, interested in gym membership 4/27: progressing HEP 6/22: HEP compliant    Time 4    Period Weeks    Status Achieved    Target Date 09/13/20      PT SHORT TERM GOAL #2   Title Patient will be able to stand in parallel bars with support of BUE for 30 seconds with adequate weight bearing through BLE.    Baseline 01/03: less than 30s, 06/12/20  pt able to stand in parallel bars 3 mins with min to mod A cueing for posture and knee blocking.    Time 4    Period Weeks    Status Achieved    Target Date 07/10/20               PT Long Term Goals - 10/11/20 0941       PT LONG TERM GOAL #1   Title Patient will be able to stand in parallel bars with support of BUE for 30-60 seconds with CGA with adequate weight bearing through BLE.    Baseline 01/03: less than 30s, 06/12/20  pt able to stand in parallel bars 3 mins with min to mod A cueing for posture and knee blocking. 3/30: 60s with mod-max A +2 with blocking bil feet/knees and TC for quad mm activation 4/27: 1 min 15 seconds with min mod blocking of knees 6/22: 62 seconds with blocking of LE's only    Time 12    Period Weeks    Status Partially Met    Target Date 01/03/21      PT LONG TERM GOAL #2   Title Patient will increase BLE gross strength half a grade as to improve functional strength for increased standing tolerance and  increased ADL ability.    Baseline 06/12/20 requires max assist for most movements impacting left LE, right min to mod assist depending on muscle group. 3/30: R hip flex, knee ext 3-/5, requires max A for LLE mvmt 4/27: R hip flex 3 knee extension 3- L trace 6/22: see note    Time 12    Period Weeks    Status Partially Met  Target Date 01/03/21      PT LONG TERM GOAL #3   Title Patient will reduce joint contracture in BLE ankle plantarflexion by being able to achieve ankle dorsiflexion to neutral for better functional mobility    Baseline 01/03: RLE patient actively moves it by 4 degrees, unable to actively move LLE into DF, 06/12/20  arom into right DF 6 degrees, left unable, trace contraction dorsal foot with DF attempts on left. 3/30: continues to be limited with achieving neutral, but demo improvements with PROM s/p MT on LLE (~3deg improvement), lacking ~15deg DF on RLE. 4/27: LLE -9 PROM RLE able to AROM to -2 to neutral  with inversion 6/22: able to obtain neutral with overpressure    Time 8    Period Weeks    Status Achieved      PT LONG TERM GOAL #4   Title Patient will be independent with gym program for total body strengthening.    Baseline 4/27: introducing dumbbells 6/22: doing some dumbbell exercises at home.    Time 12    Period Weeks    Status Partially Met    Target Date 01/03/21      PT LONG TERM GOAL #5   Title Patient will increase FOTO score to equal to or greater than 60%    to demonstrate statistically significant improvement in mobility and quality of life.    Baseline 4/27: 42% 6/22: 34%    Time 12    Period Weeks    Status On-going    Target Date 01/03/21      Additional Long Term Goals   Additional Long Term Goals Yes      PT LONG TERM GOAL #6   Title Patient will complete sit <> stand transfer with Mod I and LRAD to improve functional independence for household and community mobility    Baseline able to perform with stabilization to knees in // bars     Time 12    Period Weeks    Status New    Target Date 01/03/21      PT LONG TERM GOAL #7   Title Patient will ambulate at least 3 feet with min A using LRAD to improve mobility for household and community distances    Baseline -unable to ambulate    Time 12    Period Weeks    Status New    Target Date 01/03/21                   Plan - 10/25/20 0929     Clinical Impression Statement Patient is highly motivated throughout session. Utilization of knee immobilizers allows from standing from raised plinth table without stabilization to knees. He is able to take small steps forward and backwards with RLE but only backwards with LLE. Pt will continue to benefit from skilled therapy in order to address remaining goals/deficits still present at this time    Personal Factors and Comorbidities Comorbidity 3+;Time since onset of injury/illness/exacerbation    Comorbidities HIV, Urinary retention with bladder stretch injurt, CNS toxoplasmosis (CMS-HCC), Paraplegia, incomplete, diabetic    Examination-Activity Limitations Bathing    Examination-Participation Restrictions Driving    Stability/Clinical Decision Making Evolving/Moderate complexity    Rehab Potential Fair    PT Frequency 2x / week    PT Duration 12 weeks    PT Treatment/Interventions ADLs/Self Care Home Management;Electrical Stimulation;Moist Heat;Gait training;Stair training;Functional mobility training;Therapeutic activities;Therapeutic exercise;Balance training;Neuromuscular re-education;Wheelchair mobility training;Manual techniques;Passive range of motion;Energy conservation;Orthotic Fit/Training;Biofeedback;Cryotherapy;Ultrasound;DME Instruction;Dry needling;Vestibular;Splinting;Patient/family education;Visual/perceptual  remediation/compensation    PT Next Visit Plan Continue with progressive LE strengthening in supine/seated position and continue with Standing balance/endurance in Parallel bars.    PT Home Exercise Plan No  updates this session;    Consulted and Agree with Plan of Care Patient             Patient will benefit from skilled therapeutic intervention in order to improve the following deficits and impairments:  Decreased range of motion, Decreased strength, Impaired flexibility, Difficulty walking, Decreased mobility, Decreased activity tolerance, Decreased endurance, Impaired sensation, Decreased balance, Impaired tone  Visit Diagnosis: Difficulty in walking, not elsewhere classified  Unsteadiness on feet  Muscle weakness (generalized)     Problem List There are no problems to display for this patient.   Janna Arch, PT, DPT  10/25/2020, 9:30 AM  [place] MAIN [facility] SERVICES [address] [place], [place], [address] Phone: [phone]   Fax:  [phone]  Name: Chason Mciver MRN: [id number] Date of Birth: 1975-12-06

## 2020-10-30 ENCOUNTER — Other Ambulatory Visit: Payer: Self-pay

## 2020-10-30 ENCOUNTER — Ambulatory Visit: Payer: 59

## 2020-10-30 DIAGNOSIS — R2681 Unsteadiness on feet: Secondary | ICD-10-CM

## 2020-10-30 DIAGNOSIS — G8222 Paraplegia, incomplete: Secondary | ICD-10-CM | POA: Diagnosis not present

## 2020-10-30 DIAGNOSIS — R262 Difficulty in walking, not elsewhere classified: Secondary | ICD-10-CM

## 2020-10-30 DIAGNOSIS — M6281 Muscle weakness (generalized): Secondary | ICD-10-CM | POA: Diagnosis not present

## 2020-10-30 NOTE — Therapy (Signed)
[place] MAIN [facility] SERVICES [address] [place], [place], [address] Phone: [phone]   Fax:  [phone]  Physical Therapy Treatment  Patient Details  Name: Ryan Bush MRN: [id number] Date of Birth: 21-May-1975 Referring Provider (PT): Clarise Cruz   Encounter Date: 10/30/2020   PT End of Session - 10/30/20 0843     Visit Number 42    Number of Visits 62    Date for PT Re-Evaluation 01/03/21    Authorization Type [employer]    Authorization Time Period 06/19/20-08/14/20; 2/10 Pn 6/29    PT Start Time 0845    PT Stop Time 0930    PT Time Calculation (min) 45 min    Equipment Utilized During Treatment Gait belt    Activity Tolerance Patient tolerated treatment well;Patient limited by fatigue;No increased pain    Behavior During Therapy WFL for tasks assessed/performed             Past Medical History:  Diagnosis Date   HIV (human immunodeficiency virus infection) ([place])     History reviewed. No pertinent surgical history.  There were no vitals filed for this visit.   Subjective Assessment - 10/30/20 0944     Subjective Patient's wife has returned home from the hospital. Is happy to have her home. Has been compliant with HEP. Was unable to use immobilizers at home, is having diffuclty transporting them requests to leave them with PT until he is stronger.    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 Patient had PT and OT in the hospital for 3 months. He was DC from PT due to his insurance running out and he needed a different kind of PT. He was discharged home and he had HHPT 2 x week for 1 month. He was dischaged from [place] and was supposed to get out patient PT but his insurance was cancelled. He has a new insurance now and has [place].    Limitations Standing;Walking    How long can you sit comfortably?  unlimited    How long can you stand comfortably? 1-3 mins with BUE support    How long can you walk comfortably? unable    Currently in Pain? No/denies               Interpreter : [name]    Supine: -hamstring bent knee with leg on PT shoulder 60 seconds x1 trial each LE; deep pressure applied for tension/tone reduction each LE. ; reports LLE more affected than RLE - posterior calf stretch with progressive DF 60 second holds x1 trial each LE. -single knee to chest with progressive hold x 60 each LE -piriformis stretch, stabilization to limb provided with progressive lengthening x60 second holds  -bridge 10x with arms crossed -Heel slide LLE AAROM 10x, RLE AROM 10x   Sidelying: Hip extension 10x each LE, AAROM bilateral LE with decreased assistance required RLE Clamshells 10x each LE    Seated: Hamstring isometric into dynadisc 10x 5 seconds each LE  Standing  knee immobilizers donned in supine position, transition to sitting EOB with Min A . STS from raised plinth table with standard walker. Upon standing cue for step back ; able to perform with each LE able to self place leg. Cues for reducing demand on arms allowing for LE's to assist in stabilizing self. X3 trials.  Min A to return to seated position due  to inability to bend knees with immobilizers on.  -second trial took two steps forward/backwards with LLE and RLE -third trial took three steps forward/backwards with LLE and RLE        Pt educated throughout session about proper posture and technique with exercises. Improved exercise technique, movement at target joints, use of target muscles after min to mod verbal, visual, tactile cues   Patient is able to perform three consecutive steps with LLE with immobilizers on for first time. He is highly motivated and eager to progress his mobility. Informed that his wife is welcome to a session now that she is back home to allow for continuation of standing interventions in his  home setting. Pt will continue to benefit from skilled therapy in order to address remaining goals/deficits still present at this time                    PT Education - 10/30/20 0842     Education Details exercise technique, body mechanics    Person(s) Educated Patient    Methods Explanation;Demonstration;Tactile cues;Verbal cues    Comprehension Verbalized understanding;Returned demonstration;Verbal cues required;Tactile cues required              PT Short Term Goals - 10/11/20 0941       PT SHORT TERM GOAL #1   Title Patient will be independent in home exercise program to improve strength/mobility for better functional independence with ADLs.    Baseline 06/12/20  pt performing HEP 1x/day. 3/30: performing 1x/day, interested in gym membership 4/27: progressing HEP 6/22: HEP compliant    Time 4    Period Weeks    Status Achieved    Target Date 09/13/20      PT SHORT TERM GOAL #2   Title Patient will be able to stand in parallel bars with support of BUE for 30 seconds with adequate weight bearing through BLE.    Baseline 01/03: less than 30s, 06/12/20  pt able to stand in parallel bars 3 mins with min to mod A cueing for posture and knee blocking.    Time 4    Period Weeks    Status Achieved    Target Date 07/10/20               PT Long Term Goals - 10/11/20 0941       PT LONG TERM GOAL #1   Title Patient will be able to stand in parallel bars with support of BUE for 30-60 seconds with CGA with adequate weight bearing through BLE.    Baseline 01/03: less than 30s, 06/12/20  pt able to stand in parallel bars 3 mins with min to mod A cueing for posture and knee blocking. 3/30: 60s with mod-max A +2 with blocking bil feet/knees and TC for quad mm activation 4/27: 1 min 15 seconds with min mod blocking of knees 6/22: 62 seconds with blocking of LE's only    Time 12    Period Weeks    Status Partially Met    Target Date 01/03/21      PT LONG TERM GOAL #2    Title Patient will increase BLE gross strength half a grade as to improve functional strength for increased standing tolerance and increased ADL ability.    Baseline 06/12/20 requires max assist for most movements impacting left LE, right min to mod assist depending on muscle group. 3/30: R hip flex, knee ext 3-/5, requires max A for LLE mvmt 4/27: R hip  flex 3 knee extension 3- L trace 6/22: see note    Time 12    Period Weeks    Status Partially Met    Target Date 01/03/21      PT LONG TERM GOAL #3   Title Patient will reduce joint contracture in BLE ankle plantarflexion by being able to achieve ankle dorsiflexion to neutral for better functional mobility    Baseline 01/03: RLE patient actively moves it by 4 degrees, unable to actively move LLE into DF, 06/12/20  arom into right DF 6 degrees, left unable, trace contraction dorsal foot with DF attempts on left. 3/30: continues to be limited with achieving neutral, but demo improvements with PROM s/p MT on LLE (~3deg improvement), lacking ~15deg DF on RLE. 4/27: LLE -9 PROM RLE able to AROM to -2 to neutral  with inversion 6/22: able to obtain neutral with overpressure    Time 8    Period Weeks    Status Achieved      PT LONG TERM GOAL #4   Title Patient will be independent with gym program for total body strengthening.    Baseline 4/27: introducing dumbbells 6/22: doing some dumbbell exercises at home.    Time 12    Period Weeks    Status Partially Met    Target Date 01/03/21      PT LONG TERM GOAL #5   Title Patient will increase FOTO score to equal to or greater than 60%    to demonstrate statistically significant improvement in mobility and quality of life.    Baseline 4/27: 42% 6/22: 34%    Time 12    Period Weeks    Status On-going    Target Date 01/03/21      Additional Long Term Goals   Additional Long Term Goals Yes      PT LONG TERM GOAL #6   Title Patient will complete sit <> stand transfer with Mod I and LRAD to improve  functional independence for household and community mobility    Baseline able to perform with stabilization to knees in // bars    Time 12    Period Weeks    Status New    Target Date 01/03/21      PT LONG TERM GOAL #7   Title Patient will ambulate at least 3 feet with min A using LRAD to improve mobility for household and community distances    Baseline -unable to ambulate    Time 12    Period Weeks    Status New    Target Date 01/03/21                   Plan - 10/30/20 0948     Clinical Impression Statement Patient is able to perform three consecutive steps with LLE with immobilizers on for first time. He is highly motivated and eager to progress his mobility. Informed that his wife is welcome to a session now that she is back home to allow for continuation of standing interventions in his home setting. Pt will continue to benefit from skilled therapy in order to address remaining goals/deficits still present at this time    Personal Factors and Comorbidities Comorbidity 3+;Time since onset of injury/illness/exacerbation    Comorbidities HIV, Urinary retention with bladder stretch injurt, CNS toxoplasmosis (CMS-HCC), Paraplegia, incomplete, diabetic    Examination-Activity Limitations Bathing    Examination-Participation Restrictions Driving    Stability/Clinical Decision Making Evolving/Moderate complexity    Rehab Potential Fair  PT Frequency 2x / week    PT Duration 12 weeks    PT Treatment/Interventions ADLs/Self Care Home Management;Electrical Stimulation;Moist Heat;Gait training;Stair training;Functional mobility training;Therapeutic activities;Therapeutic exercise;Balance training;Neuromuscular re-education;Wheelchair mobility training;Manual techniques;Passive range of motion;Energy conservation;Orthotic Fit/Training;Biofeedback;Cryotherapy;Ultrasound;DME Instruction;Dry needling;Vestibular;Splinting;Patient/family education;Visual/perceptual remediation/compensation     PT Next Visit Plan Continue with progressive LE strengthening in supine/seated position and continue with Standing balance/endurance in Parallel bars.    PT Home Exercise Plan No updates this session;    Consulted and Agree with Plan of Care Patient             Patient will benefit from skilled therapeutic intervention in order to improve the following deficits and impairments:  Decreased range of motion, Decreased strength, Impaired flexibility, Difficulty walking, Decreased mobility, Decreased activity tolerance, Decreased endurance, Impaired sensation, Decreased balance, Impaired tone  Visit Diagnosis: Difficulty in walking, not elsewhere classified  Unsteadiness on feet  Muscle weakness (generalized)     Problem List There are no problems to display for this patient.   Janna Arch, PT, DPT  10/30/2020, 9:50 AM  [place] MAIN [facility] SERVICES [address] [place], [place], [address] Phone: [phone]   Fax:  [phone]  Name: Ryan Bush MRN: [id number] Date of Birth: 1975-08-27

## 2020-11-01 ENCOUNTER — Other Ambulatory Visit: Payer: Self-pay

## 2020-11-01 ENCOUNTER — Ambulatory Visit: Payer: 59

## 2020-11-01 DIAGNOSIS — R262 Difficulty in walking, not elsewhere classified: Secondary | ICD-10-CM

## 2020-11-01 DIAGNOSIS — G8222 Paraplegia, incomplete: Secondary | ICD-10-CM | POA: Diagnosis not present

## 2020-11-01 DIAGNOSIS — R2681 Unsteadiness on feet: Secondary | ICD-10-CM | POA: Diagnosis not present

## 2020-11-01 DIAGNOSIS — M6281 Muscle weakness (generalized): Secondary | ICD-10-CM

## 2020-11-01 NOTE — Therapy (Signed)
[place] MAIN [facility] SERVICES [address] [place], [place], [address] Phone: [phone]   Fax:  [phone]  Physical Therapy Treatment  Patient Details  Name: Ryan Bush MRN: [id number] Date of Birth: 1975-12-26 Referring Provider (PT): Clarise Cruz   Encounter Date: 11/01/2020   PT End of Session - 11/01/20 0936     Visit Number 43    Number of Visits 62    Date for PT Re-Evaluation 01/03/21    Authorization Type [employer]    Authorization Time Period 06/19/20-08/14/20; 2/10 Pn 6/29    PT Start Time 0845    PT Stop Time 0929    PT Time Calculation (min) 44 min    Equipment Utilized During Treatment Gait belt    Activity Tolerance Patient tolerated treatment well;Patient limited by fatigue;No increased pain    Behavior During Therapy WFL for tasks assessed/performed             Past Medical History:  Diagnosis Date   HIV (human immunodeficiency virus infection) ([place])     History reviewed. No pertinent surgical history.  There were no vitals filed for this visit.   Subjective Assessment - 11/01/20 0846     Subjective Patient reports compliance with HEP. No accidents or falls since last session.    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 Patient had PT and OT in the hospital for 3 months. He was DC from PT due to his insurance running out and he needed a different kind of PT. He was discharged home and he had HHPT 2 x week for 1 month. He was dischaged from [place] and was supposed to get out patient PT but his insurance was cancelled. He has a new insurance now and has [place].    Limitations Standing;Walking    How long can you sit comfortably? unlimited    How long can you stand comfortably? 1-3 mins with BUE support    How long can you walk comfortably? unable    Currently in Pain? No/denies                       Interpreter : Jacqui       Supine: -hamstring bent knee with leg on PT shoulder 60 seconds x1 trial each LE; deep pressure applied for tension/tone reduction each LE. ; reports LLE more affected than RLE - posterior calf stretch with progressive DF 60 second holds x1 trial each LE. -single knee to chest with progressive hold x 60 each LE -piriformis stretch, stabilization to limb provided with progressive lengthening x60 second holds  -bridge 10x with arms crossed -Heel slide LLE AAROM 10x, RLE AROM 10x   Sidelying: Hip extension 10x each LE, AAROM bilateral LE with decreased assistance required RLE Clamshells 10x each LE    Seated: Hamstring isometric into dynadisc 10x 5 seconds each LE   Standing  knee immobilizers donned in supine position, transition to sitting EOB with Min A . STS from raised plinth table with standard walker. Upon standing cue for step back ; able to perform with each LE able to self place leg. Cues for reducing demand on arms allowing for LE's to assist in stabilizing self. X3 trials.  Min A to return to seated position due to inability to bend knees with immobilizers on.  -first trial four steps forward backwards LLE and RLE ;  static stand 3 minutes after -second trial took four steps forward/backwards with LLE and RLE; static stand 2 minutes after -third trial lateral stepping 2x each LE ; very challenging for adduction of LLE ; then stand 2 minutes        Pt educated throughout session about proper posture and technique with exercises. Improved exercise technique, movement at target joints, use of target muscles after min to mod verbal, visual, tactile cues   Patient able to perform increased mobility of affected LE's. He is challenged with adduction and flexion with LLE more than RLE. He is highly motivated and eager to progress with his functional mobility to increase his independence. Pt will continue to benefit from skilled  therapy in order to address remaining goals/deficits still present at this time                PT Education - 11/01/20 0936     Education Details exercise technique, body mechanics    Person(s) Educated Patient    Methods Explanation;Demonstration;Tactile cues;Verbal cues    Comprehension Verbalized understanding;Returned demonstration;Verbal cues required;Tactile cues required              PT Short Term Goals - 10/11/20 0941       PT SHORT TERM GOAL #1   Title Patient will be independent in home exercise program to improve strength/mobility for better functional independence with ADLs.    Baseline 06/12/20  pt performing HEP 1x/day. 3/30: performing 1x/day, interested in gym membership 4/27: progressing HEP 6/22: HEP compliant    Time 4    Period Weeks    Status Achieved    Target Date 09/13/20      PT SHORT TERM GOAL #2   Title Patient will be able to stand in parallel bars with support of BUE for 30 seconds with adequate weight bearing through BLE.    Baseline 01/03: less than 30s, 06/12/20  pt able to stand in parallel bars 3 mins with min to mod A cueing for posture and knee blocking.    Time 4    Period Weeks    Status Achieved    Target Date 07/10/20               PT Long Term Goals - 10/11/20 0941       PT LONG TERM GOAL #1   Title Patient will be able to stand in parallel bars with support of BUE for 30-60 seconds with CGA with adequate weight bearing through BLE.    Baseline 01/03: less than 30s, 06/12/20  pt able to stand in parallel bars 3 mins with min to mod A cueing for posture and knee blocking. 3/30: 60s with mod-max A +2 with blocking bil feet/knees and TC for quad mm activation 4/27: 1 min 15 seconds with min mod blocking of knees 6/22: 62 seconds with blocking of LE's only    Time 12    Period Weeks    Status Partially Met    Target Date 01/03/21      PT LONG TERM GOAL #2   Title Patient will increase BLE gross strength half a grade  as to improve functional strength for increased standing tolerance and increased ADL ability.    Baseline 06/12/20 requires max assist for most movements impacting left LE, right min to mod assist depending on muscle group. 3/30: R hip flex, knee ext 3-/5, requires max A for LLE mvmt 4/27: R hip flex 3 knee extension 3- L trace 6/22: see  note    Time 12    Period Weeks    Status Partially Met    Target Date 01/03/21      PT LONG TERM GOAL #3   Title Patient will reduce joint contracture in BLE ankle plantarflexion by being able to achieve ankle dorsiflexion to neutral for better functional mobility    Baseline 01/03: RLE patient actively moves it by 4 degrees, unable to actively move LLE into DF, 06/12/20  arom into right DF 6 degrees, left unable, trace contraction dorsal foot with DF attempts on left. 3/30: continues to be limited with achieving neutral, but demo improvements with PROM s/p MT on LLE (~3deg improvement), lacking ~15deg DF on RLE. 4/27: LLE -9 PROM RLE able to AROM to -2 to neutral  with inversion 6/22: able to obtain neutral with overpressure    Time 8    Period Weeks    Status Achieved      PT LONG TERM GOAL #4   Title Patient will be independent with gym program for total body strengthening.    Baseline 4/27: introducing dumbbells 6/22: doing some dumbbell exercises at home.    Time 12    Period Weeks    Status Partially Met    Target Date 01/03/21      PT LONG TERM GOAL #5   Title Patient will increase FOTO score to equal to or greater than 60%    to demonstrate statistically significant improvement in mobility and quality of life.    Baseline 4/27: 42% 6/22: 34%    Time 12    Period Weeks    Status On-going    Target Date 01/03/21      Additional Long Term Goals   Additional Long Term Goals Yes      PT LONG TERM GOAL #6   Title Patient will complete sit <> stand transfer with Mod I and LRAD to improve functional independence for household and community mobility     Baseline able to perform with stabilization to knees in // bars    Time 12    Period Weeks    Status New    Target Date 01/03/21      PT LONG TERM GOAL #7   Title Patient will ambulate at least 3 feet with min A using LRAD to improve mobility for household and community distances    Baseline -unable to ambulate    Time 12    Period Weeks    Status New    Target Date 01/03/21                   Plan - 11/01/20 1455     Clinical Impression Statement Patient able to perform increased mobility of affected LE's. He is challenged with adduction and flexion with LLE more than RLE. He is highly motivated and eager to progress with his functional mobility to increase his independence. Pt will continue to benefit from skilled therapy in order to address remaining goals/deficits still present at this time    Personal Factors and Comorbidities Comorbidity 3+;Time since onset of injury/illness/exacerbation    Comorbidities HIV, Urinary retention with bladder stretch injurt, CNS toxoplasmosis (CMS-HCC), Paraplegia, incomplete, diabetic    Examination-Activity Limitations Bathing    Examination-Participation Restrictions Driving    Stability/Clinical Decision Making Evolving/Moderate complexity    Rehab Potential Fair    PT Frequency 2x / week    PT Duration 12 weeks    PT Treatment/Interventions ADLs/Self Care Home Management;Electrical Stimulation;Moist Heat;Gait  training;Stair training;Functional mobility training;Therapeutic activities;Therapeutic exercise;Balance training;Neuromuscular re-education;Wheelchair mobility training;Manual techniques;Passive range of motion;Energy conservation;Orthotic Fit/Training;Biofeedback;Cryotherapy;Ultrasound;DME Instruction;Dry needling;Vestibular;Splinting;Patient/family education;Visual/perceptual remediation/compensation    PT Next Visit Plan Continue with progressive LE strengthening in supine/seated position and continue with Standing  balance/endurance in Parallel bars.    PT Home Exercise Plan No updates this session;    Consulted and Agree with Plan of Care Patient             Patient will benefit from skilled therapeutic intervention in order to improve the following deficits and impairments:  Decreased range of motion, Decreased strength, Impaired flexibility, Difficulty walking, Decreased mobility, Decreased activity tolerance, Decreased endurance, Impaired sensation, Decreased balance, Impaired tone  Visit Diagnosis: Difficulty in walking, not elsewhere classified  Unsteadiness on feet  Muscle weakness (generalized)     Problem List There are no problems to display for this patient.  Janna Arch, PT, DPT  11/01/2020, 2:56 PM  [place] MAIN [facility] SERVICES [address] [place], [place], [address] Phone: [phone]   Fax:  [phone]  Name: Janthony Holleman MRN: [id number] Date of Birth: 1976/02/17

## 2020-11-06 ENCOUNTER — Other Ambulatory Visit: Payer: Self-pay

## 2020-11-06 ENCOUNTER — Ambulatory Visit: Payer: 59

## 2020-11-06 DIAGNOSIS — M6281 Muscle weakness (generalized): Secondary | ICD-10-CM | POA: Diagnosis not present

## 2020-11-06 DIAGNOSIS — R2681 Unsteadiness on feet: Secondary | ICD-10-CM

## 2020-11-06 DIAGNOSIS — G8222 Paraplegia, incomplete: Secondary | ICD-10-CM | POA: Diagnosis not present

## 2020-11-06 DIAGNOSIS — R262 Difficulty in walking, not elsewhere classified: Secondary | ICD-10-CM

## 2020-11-06 NOTE — Therapy (Signed)
[place] MAIN [facility] SERVICES [address] [place], [place], [address] Phone: [phone]   Fax:  [phone]  Physical Therapy Treatment  Patient Details  Name: Ryan Bush MRN: [id number] Date of Birth: October 22, 1975 Referring Provider (PT): Clarise Cruz   Encounter Date: 11/06/2020   PT End of Session - 11/06/20 0843     Visit Number 44    Number of Visits 62    Date for PT Re-Evaluation 01/03/21    Authorization Type [employer]    Authorization Time Period 06/19/20-08/14/20; 4/10 Pn 6/29    PT Start Time 0845    PT Stop Time 0929    PT Time Calculation (min) 44 min    Equipment Utilized During Treatment Gait belt    Activity Tolerance Patient tolerated treatment well;Patient limited by fatigue;No increased pain    Behavior During Therapy WFL for tasks assessed/performed             Past Medical History:  Diagnosis Date   HIV (human immunodeficiency virus infection) ([place])     History reviewed. No pertinent surgical history.  There were no vitals filed for this visit.   Subjective Assessment - 11/06/20 0857     Subjective Patient reports no falls or LOB, has been compliant with HEP. Went to the park with his kids over the weekend.    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 Patient had PT and OT in the hospital for 3 months. He was DC from PT due to his insurance running out and he needed a different kind of PT. He was discharged home and he had HHPT 2 x week for 1 month. He was dischaged from [place] and was supposed to get out patient PT but his insurance was cancelled. He has a new insurance now and has [place].    Limitations Standing;Walking    How long can you sit comfortably? unlimited    How long can you stand comfortably? 1-3 mins with BUE support    How long can you walk comfortably? unable     Currently in Pain? No/denies                Interpreter : Jacqui       Supine: -hamstring bent knee with leg on PT shoulder 60 seconds x1 trial each LE; deep pressure applied for tension/tone reduction each LE. ; reports LLE more affected than RLE - posterior calf stretch with progressive DF 60 second holds x1 trial each LE. -single knee to chest with progressive hold x 60 each LE -piriformis stretch, stabilization to limb provided with progressive lengthening x60 second holds  -bridge 10x with arms crossed -Heel slide LLE AAROM 10x, RLE AROM 10x   Sidelying: Hip extension 10x each LE, AAROM bilateral LE with decreased assistance required RLE Clamshells 10x each LE    Seated: Hamstring isometric into dynadisc 10x 5 seconds each LE   Standing  knee immobilizers donned in supine position, transition to sitting EOB with Min A . STS from raised plinth table with standard walker. Upon standing cue for step back ; able to perform with each LE able to self place leg. Cues for reducing demand on arms allowing for LE's to assist in stabilizing self. X3 trials.  Min A to return to seated position due to inability to bend knees with immobilizers on.  -first trial five steps forward backwards  LLE and RLE ; static stand 3 minutes after -second trial took three steps alternating forward/backwards with LLE and RLE; able to perform alternating for first time  -third trial lateral stepping 2x each LE ; very challenging for adduction of LLE ; then stand 2 minutes        Pt educated throughout session about proper posture and technique with exercises. Improved exercise technique, movement at target joints, use of target muscles after min to mod verbal, visual, tactile cues   Patient is highly motivated throughout physical therapy session. He is fatigued with weightbearing interventions with LLE fatiguing quicker than RLE at this time. Lateral stepping with RLE continues to be an area of  limitation. He is able to successfully perform alternating steppage. Pt will continue to benefit from skilled therapy in order to address remaining goals/deficits still present at this time                    PT Education - 11/06/20 0842     Education Details exercise technique, body mechanics    Person(s) Educated Patient    Methods Explanation;Demonstration;Tactile cues;Verbal cues    Comprehension Verbalized understanding;Returned demonstration;Verbal cues required;Tactile cues required              PT Short Term Goals - 10/11/20 0941       PT SHORT TERM GOAL #1   Title Patient will be independent in home exercise program to improve strength/mobility for better functional independence with ADLs.    Baseline 06/12/20  pt performing HEP 1x/day. 3/30: performing 1x/day, interested in gym membership 4/27: progressing HEP 6/22: HEP compliant    Time 4    Period Weeks    Status Achieved    Target Date 09/13/20      PT SHORT TERM GOAL #2   Title Patient will be able to stand in parallel bars with support of BUE for 30 seconds with adequate weight bearing through BLE.    Baseline 01/03: less than 30s, 06/12/20  pt able to stand in parallel bars 3 mins with min to mod A cueing for posture and knee blocking.    Time 4    Period Weeks    Status Achieved    Target Date 07/10/20               PT Long Term Goals - 10/11/20 0941       PT LONG TERM GOAL #1   Title Patient will be able to stand in parallel bars with support of BUE for 30-60 seconds with CGA with adequate weight bearing through BLE.    Baseline 01/03: less than 30s, 06/12/20  pt able to stand in parallel bars 3 mins with min to mod A cueing for posture and knee blocking. 3/30: 60s with mod-max A +2 with blocking bil feet/knees and TC for quad mm activation 4/27: 1 min 15 seconds with min mod blocking of knees 6/22: 62 seconds with blocking of LE's only    Time 12    Period Weeks    Status Partially Met     Target Date 01/03/21      PT LONG TERM GOAL #2   Title Patient will increase BLE gross strength half a grade as to improve functional strength for increased standing tolerance and increased ADL ability.    Baseline 06/12/20 requires max assist for most movements impacting left LE, right min to mod assist depending on muscle group. 3/30: R hip flex, knee ext 3-/5, requires  max A for LLE mvmt 4/27: R hip flex 3 knee extension 3- L trace 6/22: see note    Time 12    Period Weeks    Status Partially Met    Target Date 01/03/21      PT LONG TERM GOAL #3   Title Patient will reduce joint contracture in BLE ankle plantarflexion by being able to achieve ankle dorsiflexion to neutral for better functional mobility    Baseline 01/03: RLE patient actively moves it by 4 degrees, unable to actively move LLE into DF, 06/12/20  arom into right DF 6 degrees, left unable, trace contraction dorsal foot with DF attempts on left. 3/30: continues to be limited with achieving neutral, but demo improvements with PROM s/p MT on LLE (~3deg improvement), lacking ~15deg DF on RLE. 4/27: LLE -9 PROM RLE able to AROM to -2 to neutral  with inversion 6/22: able to obtain neutral with overpressure    Time 8    Period Weeks    Status Achieved      PT LONG TERM GOAL #4   Title Patient will be independent with gym program for total body strengthening.    Baseline 4/27: introducing dumbbells 6/22: doing some dumbbell exercises at home.    Time 12    Period Weeks    Status Partially Met    Target Date 01/03/21      PT LONG TERM GOAL #5   Title Patient will increase FOTO score to equal to or greater than 60%    to demonstrate statistically significant improvement in mobility and quality of life.    Baseline 4/27: 42% 6/22: 34%    Time 12    Period Weeks    Status On-going    Target Date 01/03/21      Additional Long Term Goals   Additional Long Term Goals Yes      PT LONG TERM GOAL #6   Title Patient will complete  sit <> stand transfer with Mod I and LRAD to improve functional independence for household and community mobility    Baseline able to perform with stabilization to knees in // bars    Time 12    Period Weeks    Status New    Target Date 01/03/21      PT LONG TERM GOAL #7   Title Patient will ambulate at least 3 feet with min A using LRAD to improve mobility for household and community distances    Baseline -unable to ambulate    Time 12    Period Weeks    Status New    Target Date 01/03/21                   Plan - 11/06/20 1024     Clinical Impression Statement Patient is highly motivated throughout physical therapy session. He is fatigued with weightbearing interventions with LLE fatiguing quicker than RLE at this time. Lateral stepping with RLE continues to be an area of limitation. He is able to successfully perform alternating steppage. Pt will continue to benefit from skilled therapy in order to address remaining goals/deficits still present at this time    Personal Factors and Comorbidities Comorbidity 3+;Time since onset of injury/illness/exacerbation    Comorbidities HIV, Urinary retention with bladder stretch injurt, CNS toxoplasmosis (CMS-HCC), Paraplegia, incomplete, diabetic    Examination-Activity Limitations Bathing    Examination-Participation Restrictions Driving    Stability/Clinical Decision Making Evolving/Moderate complexity    Rehab Potential Fair    PT  Frequency 2x / week    PT Duration 12 weeks    PT Treatment/Interventions ADLs/Self Care Home Management;Electrical Stimulation;Moist Heat;Gait training;Stair training;Functional mobility training;Therapeutic activities;Therapeutic exercise;Balance training;Neuromuscular re-education;Wheelchair mobility training;Manual techniques;Passive range of motion;Energy conservation;Orthotic Fit/Training;Biofeedback;Cryotherapy;Ultrasound;DME Instruction;Dry needling;Vestibular;Splinting;Patient/family  education;Visual/perceptual remediation/compensation    PT Next Visit Plan Continue with progressive LE strengthening in supine/seated position and continue with Standing balance/endurance in Parallel bars.    PT Home Exercise Plan No updates this session;    Consulted and Agree with Plan of Care Patient             Patient will benefit from skilled therapeutic intervention in order to improve the following deficits and impairments:  Decreased range of motion, Decreased strength, Impaired flexibility, Difficulty walking, Decreased mobility, Decreased activity tolerance, Decreased endurance, Impaired sensation, Decreased balance, Impaired tone  Visit Diagnosis: Difficulty in walking, not elsewhere classified  Unsteadiness on feet  Muscle weakness (generalized)     Problem List There are no problems to display for this patient.   Janna Arch, PT, DPT  11/06/2020, 10:26 AM  [place] MAIN [facility] SERVICES [address] [place], [place], [address] Phone: [phone]   Fax:  [phone]  Name: Ryan Bush MRN: [id number] Date of Birth: 06-24-1975

## 2020-11-08 ENCOUNTER — Ambulatory Visit: Payer: 59

## 2020-11-08 ENCOUNTER — Other Ambulatory Visit: Payer: Self-pay

## 2020-11-08 DIAGNOSIS — M6281 Muscle weakness (generalized): Secondary | ICD-10-CM

## 2020-11-08 DIAGNOSIS — R262 Difficulty in walking, not elsewhere classified: Secondary | ICD-10-CM

## 2020-11-08 DIAGNOSIS — R2681 Unsteadiness on feet: Secondary | ICD-10-CM

## 2020-11-08 DIAGNOSIS — G8222 Paraplegia, incomplete: Secondary | ICD-10-CM | POA: Diagnosis not present

## 2020-11-08 NOTE — Therapy (Signed)
[place] MAIN [facility] SERVICES [address] [place], [place], [address] Phone: [phone]   Fax:  [phone]  Physical Therapy Treatment  Patient Details  Name: Ryan Bush MRN: [id number] Date of Birth: [birth year] Referring Provider (PT): Clarise Cruz   Encounter Date: 11/08/2020   PT End of Session - 11/08/20 1029     Visit Number 45    Number of Visits 62    Date for PT Re-Evaluation 01/03/21    Authorization Type [employer]    Authorization Time Period 06/19/20-08/14/20; 5/10 Pn 6/29    PT Start Time 0845    PT Stop Time 0929    PT Time Calculation (min) 44 min    Equipment Utilized During Treatment Gait belt    Activity Tolerance Patient tolerated treatment well;Patient limited by fatigue;No increased pain    Behavior During Therapy WFL for tasks assessed/performed             Past Medical History:  Diagnosis Date   HIV (human immunodeficiency virus infection) ([place])     History reviewed. No pertinent surgical history.  There were no vitals filed for this visit.   Subjective Assessment - 11/08/20 1029     Subjective Patient reports he did his HEP yesterday and did some stretching this morning.    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 Patient had PT and OT in the hospital for 3 months. He was DC from PT due to his insurance running out and he needed a different kind of PT. He was discharged home and he had HHPT 2 x week for 1 month. He was dischaged from [place] and was supposed to get out patient PT but his insurance was cancelled. He has a new insurance now and has [place].    Limitations Standing;Walking    How long can you sit comfortably? unlimited    How long can you stand comfortably? 1-3 mins with BUE support    How long can you walk comfortably? unable    Currently in Pain? No/denies                    Interpreter : Marit[place]        Supine: -hamstring bent knee with leg on PT shoulder 60 seconds x1 trial each LE; deep pressure applied for tension/tone reduction each LE. ; reports LLE more affected than RLE - posterior calf stretch with progressive DF 60 second holds x1 trial each LE. -single knee to chest with progressive hold x 60 each LE -piriformis stretch, stabilization to limb provided with progressive lengthening x60 second holds  -bridge 10x with arms crossed -Heel slide LLE AAROM 10x, RLE AROM 10x   Sidelying: Hip extension 10x each LE, AAROM bilateral LE with decreased assistance required RLE Clamshells 10x each LE    Seated: Hamstring isometric into dynadisc 10x 5 seconds each LE  seated LAQ 10x RLE, AAROM with LLE  Standing  knee immobilizers donned in supine position, transition to sitting EOB with Min A . STS from raised plinth table with standard walker. Upon standing cue for step back ; able to perform with each LE able to self place leg. Cues for reducing demand on arms allowing for LE's to assist in stabilizing self. X3 trials.  Min A to return to seated position due to inability to bend knees with immobilizers on.  -first trial five  steps alternating forward backwards LLE and RLE ; static stand 3 minutes after -second trial took six steps alternating LLE and RLE ; very challenging cues for breathing.  -third trial static stand 2 minutes; very fatigued by end.         Pt educated throughout session about proper posture and technique with exercises. Improved exercise technique, movement at target joints, use of target muscles after min to mod verbal, visual, tactile cues  Patient is significantly improving with ability to stand at this time with knee immobilizers. Continued focus on utilizing LE's for standing rather than pressing only through arms tolerated well. He is able to perform alternating steps and is getting close to being able to walk  away from table at this time. Pt will continue to benefit from skilled therapy in order to address remaining goals/deficits still present at this time                  PT Education - 11/08/20 1029     Education Details exercise technique, standing with LE's    Person(s) Educated Patient    Methods Explanation;Demonstration;Tactile cues;Verbal cues    Comprehension Verbalized understanding;Returned demonstration;Verbal cues required;Tactile cues required              PT Short Term Goals - 10/11/20 0941       PT SHORT TERM GOAL #1   Title Patient will be independent in home exercise program to improve strength/mobility for better functional independence with ADLs.    Baseline 06/12/20  pt performing HEP 1x/day. 3/30: performing 1x/day, interested in gym membership 4/27: progressing HEP 6/22: HEP compliant    Time 4    Period Weeks    Status Achieved    Target Date 09/13/20      PT SHORT TERM GOAL #2   Title Patient will be able to stand in parallel bars with support of BUE for 30 seconds with adequate weight bearing through BLE.    Baseline 01/03: less than 30s, 06/12/20  pt able to stand in parallel bars 3 mins with min to mod A cueing for posture and knee blocking.    Time 4    Period Weeks    Status Achieved    Target Date 07/10/20               PT Long Term Goals - 10/11/20 0941       PT LONG TERM GOAL #1   Title Patient will be able to stand in parallel bars with support of BUE for 30-60 seconds with CGA with adequate weight bearing through BLE.    Baseline 01/03: less than 30s, 06/12/20  pt able to stand in parallel bars 3 mins with min to mod A cueing for posture and knee blocking. 3/30: 60s with mod-max A +2 with blocking bil feet/knees and TC for quad mm activation 4/27: 1 min 15 seconds with min mod blocking of knees 6/22: 62 seconds with blocking of LE's only    Time 12    Period Weeks    Status Partially Met    Target Date 01/03/21      PT  LONG TERM GOAL #2   Title Patient will increase BLE gross strength half a grade as to improve functional strength for increased standing tolerance and increased ADL ability.    Baseline 06/12/20 requires max assist for most movements impacting left LE, right min to mod assist depending on muscle group. 3/30: R hip flex, knee ext 3-/5, requires  max A for LLE mvmt 4/27: R hip flex 3 knee extension 3- L trace 6/22: see note    Time 12    Period Weeks    Status Partially Met    Target Date 01/03/21      PT LONG TERM GOAL #3   Title Patient will reduce joint contracture in BLE ankle plantarflexion by being able to achieve ankle dorsiflexion to neutral for better functional mobility    Baseline 01/03: RLE patient actively moves it by 4 degrees, unable to actively move LLE into DF, 06/12/20  arom into right DF 6 degrees, left unable, trace contraction dorsal foot with DF attempts on left. 3/30: continues to be limited with achieving neutral, but demo improvements with PROM s/p MT on LLE (~3deg improvement), lacking ~15deg DF on RLE. 4/27: LLE -9 PROM RLE able to AROM to -2 to neutral  with inversion 6/22: able to obtain neutral with overpressure    Time 8    Period Weeks    Status Achieved      PT LONG TERM GOAL #4   Title Patient will be independent with gym program for total body strengthening.    Baseline 4/27: introducing dumbbells 6/22: doing some dumbbell exercises at home.    Time 12    Period Weeks    Status Partially Met    Target Date 01/03/21      PT LONG TERM GOAL #5   Title Patient will increase FOTO score to equal to or greater than 60%    to demonstrate statistically significant improvement in mobility and quality of life.    Baseline 4/27: 42% 6/22: 34%    Time 12    Period Weeks    Status On-going    Target Date 01/03/21      Additional Long Term Goals   Additional Long Term Goals Yes      PT LONG TERM GOAL #6   Title Patient will complete sit <> stand transfer with Mod I  and LRAD to improve functional independence for household and community mobility    Baseline able to perform with stabilization to knees in // bars    Time 12    Period Weeks    Status New    Target Date 01/03/21      PT LONG TERM GOAL #7   Title Patient will ambulate at least 3 feet with min A using LRAD to improve mobility for household and community distances    Baseline -unable to ambulate    Time 12    Period Weeks    Status New    Target Date 01/03/21                   Plan - 11/08/20 1032     Clinical Impression Statement Patient is significantly improving with ability to stand at this time with knee immobilizers. Continued focus on utilizing LE's for standing rather than pressing only through arms tolerated well. He is able to perform alternating steps and is getting close to being able to walk away from table at this time. Pt will continue to benefit from skilled therapy in order to address remaining goals/deficits still present at this time    Personal Factors and Comorbidities Comorbidity 3+;Time since onset of injury/illness/exacerbation    Comorbidities HIV, Urinary retention with bladder stretch injurt, CNS toxoplasmosis (CMS-HCC), Paraplegia, incomplete, diabetic    Examination-Activity Limitations Bathing    Examination-Participation Restrictions Driving    Stability/Clinical Decision Making Evolving/Moderate complexity  Rehab Potential Fair    PT Frequency 2x / week    PT Duration 12 weeks    PT Treatment/Interventions ADLs/Self Care Home Management;Electrical Stimulation;Moist Heat;Gait training;Stair training;Functional mobility training;Therapeutic activities;Therapeutic exercise;Balance training;Neuromuscular re-education;Wheelchair mobility training;Manual techniques;Passive range of motion;Energy conservation;Orthotic Fit/Training;Biofeedback;Cryotherapy;Ultrasound;DME Instruction;Dry needling;Vestibular;Splinting;Patient/family  education;Visual/perceptual remediation/compensation    PT Next Visit Plan Continue with progressive LE strengthening in supine/seated position and continue with Standing balance/endurance in Parallel bars.    PT Home Exercise Plan No updates this session;    Consulted and Agree with Plan of Care Patient             Patient will benefit from skilled therapeutic intervention in order to improve the following deficits and impairments:  Decreased range of motion, Decreased strength, Impaired flexibility, Difficulty walking, Decreased mobility, Decreased activity tolerance, Decreased endurance, Impaired sensation, Decreased balance, Impaired tone  Visit Diagnosis: Difficulty in walking, not elsewhere classified  Unsteadiness on feet  Muscle weakness (generalized)     Problem List There are no problems to display for this patient.  Janna Arch, PT, DPT  11/08/2020, 10:33 AM  [place] MAIN [facility] SERVICES [address] [place], [place], [address] Phone: [phone]   Fax:  [phone]  Name: Ryan Bush MRN: [id number] Date of Birth: [birth year]

## 2020-11-13 ENCOUNTER — Ambulatory Visit: Payer: 59 | Admitting: Physical Therapy

## 2020-11-13 ENCOUNTER — Other Ambulatory Visit: Payer: Self-pay

## 2020-11-13 DIAGNOSIS — R2681 Unsteadiness on feet: Secondary | ICD-10-CM | POA: Diagnosis not present

## 2020-11-13 DIAGNOSIS — M6281 Muscle weakness (generalized): Secondary | ICD-10-CM | POA: Diagnosis not present

## 2020-11-13 DIAGNOSIS — G8222 Paraplegia, incomplete: Secondary | ICD-10-CM | POA: Diagnosis not present

## 2020-11-13 DIAGNOSIS — R262 Difficulty in walking, not elsewhere classified: Secondary | ICD-10-CM | POA: Diagnosis not present

## 2020-11-13 NOTE — Therapy (Signed)
[place] MAIN [facility] SERVICES [address] [place], [place], [address] Phone: [phone]   Fax:  [phone]  Physical Therapy Treatment  Patient Details  Name: Ryan Bush MRN: [id number] Date of Birth: [birth year] Referring Provider (PT): Clarise Cruz   Encounter Date: 11/13/2020   PT End of Session - 11/13/20 1233     Visit Number 46    Number of Visits 62    Date for PT Re-Evaluation 01/03/21    Authorization Type [employer]    Authorization Time Period 06/19/20-08/14/20; 5/10 Pn 6/29    PT Start Time 0845    PT Stop Time 0932    PT Time Calculation (min) 47 min    Equipment Utilized During Treatment Gait belt    Activity Tolerance Patient tolerated treatment well;Patient limited by fatigue;No increased pain    Behavior During Therapy WFL for tasks assessed/performed             Past Medical History:  Diagnosis Date   HIV (human immunodeficiency virus infection) ([place])     History reviewed. No pertinent surgical history.  There were no vitals filed for this visit.   Subjective Assessment - 11/13/20 1227     Subjective Patient reports performing stretches and HEP at home. He also states he stands at home in makeshift parallel bars that his friend made him. He expresses gratefullness for how much progress he has made since starting PT and how he is much more mobile and independent at home.    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 Patient had PT and OT in the hospital for 3 months. He was DC from PT due to his insurance running out and he needed a different kind of PT. He was discharged home and he had HHPT 2 x week for 1 month. He was dischaged from [place] and was supposed to get out patient PT but his insurance was cancelled. He has a new insurance now and has [place].    Limitations Standing;Walking     How long can you sit comfortably? unlimited    How long can you stand comfortably? 1-3 mins with BUE support    How long can you walk comfortably? unable    Currently in Pain? No/denies              Interpreter : Effie Shy     Supine: -hamstring bent knee with leg on PT shoulder 60 seconds x1 trial each LE; deep pressure applied for tension/tone reduction each LE. ; reports LLE more affected than RLE - posterior calf stretch with progressive DF 60 second holds x1 trial each LE. -single knee to chest with progressive hold x 60 each LE -piriformis stretch, stabilization to limb provided with progressive lengthening x60 second holds -Heel slide LLE AAROM 10x, RLE AROM 10x   Seated: Hamstring isometric into dynadisc 10x 5 seconds each LE Seated LAQ 10x RLE, AAROM with LLE  Knee extension stretch x60 each LE Standing  knee immobilizers donned in supine position, transition to sitting EOB with SUP and increased time STS from raised plinth table with standard walker. Pt able to reposition BLE once standing to position feet under the hips. Cues for reducing demand on arms allowing for LE's to assist in stabilizing self. X3 trials.  Min A to return to seated position due to inability to bend knees with immobilizers on.  -first trial:  five steps alternating forward backwards LLE and RLE ; static stand 2 minutes after -second trial: alternating march with static standing rest breaks; 3 minutes  -third trial: alternating lateral steps x 6 in each direction; 2 minutes.       Pt educated throughout session about proper posture and technique with exercises. Improved exercise technique, movement at target joints, use of target muscles after min to mod verbal, visual, tactile cues   Pt arrives to PT with excellent motivation. Standing bouts continue to progress with increased time as well as challenge in the standing position. He does demonstrate spasticity in bilateral gastrocs resulting in PF and  knee flexion during standing. PT provided VC to decrease UE support and shift weight posteriorly in an attempt to increase DF with body weight providing a stretch. Ambulation in the // bars was offered, pt declined at this time. Pt will continue to benefit from skilled therapy in order to address remaining goals/deficits still present at this time.          PT Short Term Goals - 10/11/20 0941       PT SHORT TERM GOAL #1   Title Patient will be independent in home exercise program to improve strength/mobility for better functional independence with ADLs.    Baseline 06/12/20  pt performing HEP 1x/day. 3/30: performing 1x/day, interested in gym membership 4/27: progressing HEP 6/22: HEP compliant    Time 4    Period Weeks    Status Achieved    Target Date 09/13/20      PT SHORT TERM GOAL #2   Title Patient will be able to stand in parallel bars with support of BUE for 30 seconds with adequate weight bearing through BLE.    Baseline 01/03: less than 30s, 06/12/20  pt able to stand in parallel bars 3 mins with min to mod A cueing for posture and knee blocking.    Time 4    Period Weeks    Status Achieved    Target Date 07/10/20               PT Long Term Goals - 10/11/20 0941       PT LONG TERM GOAL #1   Title Patient will be able to stand in parallel bars with support of BUE for 30-60 seconds with CGA with adequate weight bearing through BLE.    Baseline 01/03: less than 30s, 06/12/20  pt able to stand in parallel bars 3 mins with min to mod A cueing for posture and knee blocking. 3/30: 60s with mod-max A +2 with blocking bil feet/knees and TC for quad mm activation 4/27: 1 min 15 seconds with min mod blocking of knees 6/22: 62 seconds with blocking of LE's only    Time 12    Period Weeks    Status Partially Met    Target Date 01/03/21      PT LONG TERM GOAL #2   Title Patient will increase BLE gross strength half a grade as to improve functional strength for increased  standing tolerance and increased ADL ability.    Baseline 06/12/20 requires max assist for most movements impacting left LE, right min to mod assist depending on muscle group. 3/30: R hip flex, knee ext 3-/5, requires max A for LLE mvmt 4/27: R hip flex 3 knee extension 3- L trace 6/22: see note    Time 12    Period Weeks    Status Partially Met    Target Date 01/03/21  PT LONG TERM GOAL #3   Title Patient will reduce joint contracture in BLE ankle plantarflexion by being able to achieve ankle dorsiflexion to neutral for better functional mobility    Baseline 01/03: RLE patient actively moves it by 4 degrees, unable to actively move LLE into DF, 06/12/20  arom into right DF 6 degrees, left unable, trace contraction dorsal foot with DF attempts on left. 3/30: continues to be limited with achieving neutral, but demo improvements with PROM s/p MT on LLE (~3deg improvement), lacking ~15deg DF on RLE. 4/27: LLE -9 PROM RLE able to AROM to -2 to neutral  with inversion 6/22: able to obtain neutral with overpressure    Time 8    Period Weeks    Status Achieved      PT LONG TERM GOAL #4   Title Patient will be independent with gym program for total body strengthening.    Baseline 4/27: introducing dumbbells 6/22: doing some dumbbell exercises at home.    Time 12    Period Weeks    Status Partially Met    Target Date 01/03/21      PT LONG TERM GOAL #5   Title Patient will increase FOTO score to equal to or greater than 60%    to demonstrate statistically significant improvement in mobility and quality of life.    Baseline 4/27: 42% 6/22: 34%    Time 12    Period Weeks    Status On-going    Target Date 01/03/21      Additional Long Term Goals   Additional Long Term Goals Yes      PT LONG TERM GOAL #6   Title Patient will complete sit <> stand transfer with Mod I and LRAD to improve functional independence for household and community mobility    Baseline able to perform with stabilization  to knees in // bars    Time 12    Period Weeks    Status New    Target Date 01/03/21      PT LONG TERM GOAL #7   Title Patient will ambulate at least 3 feet with min A using LRAD to improve mobility for household and community distances    Baseline -unable to ambulate    Time 12    Period Weeks    Status New    Target Date 01/03/21                   Plan - 11/13/20 1234     Clinical Impression Statement Pt arrives to PT with excellent motivation. Standing bouts continue to progress with increased time as well as challenge in the standing position. He does demonstrate spasticity in bilateral gastrocs resulting in PF and knee flexion during standing. PT provided VC to decrease UE support and shift weight posteriorly in an attempt to increase DF with body weight providing a stretch. Ambulation in the // bars was offered, pt declined at this time. Pt will continue to benefit from skilled therapy in order to address remaining goals/deficits still present at this time.    Personal Factors and Comorbidities Comorbidity 3+;Time since onset of injury/illness/exacerbation    Comorbidities HIV, Urinary retention with bladder stretch injurt, CNS toxoplasmosis (CMS-HCC), Paraplegia, incomplete, diabetic    Examination-Activity Limitations Bathing    Examination-Participation Restrictions Driving    Stability/Clinical Decision Making Evolving/Moderate complexity    Rehab Potential Fair    PT Frequency 2x / week    PT Duration 12 weeks  PT Treatment/Interventions ADLs/Self Care Home Management;Electrical Stimulation;Moist Heat;Gait training;Stair training;Functional mobility training;Therapeutic activities;Therapeutic exercise;Balance training;Neuromuscular re-education;Wheelchair mobility training;Manual techniques;Passive range of motion;Energy conservation;Orthotic Fit/Training;Biofeedback;Cryotherapy;Ultrasound;DME Instruction;Dry needling;Vestibular;Splinting;Patient/family  education;Visual/perceptual remediation/compensation    PT Next Visit Plan Continue with progressive LE strengthening in supine/seated position and continue with Standing balance/endurance in Parallel bars.    PT Home Exercise Plan No updates this session;    Consulted and Agree with Plan of Care Patient             Patient will benefit from skilled therapeutic intervention in order to improve the following deficits and impairments:  Decreased range of motion, Decreased strength, Impaired flexibility, Difficulty walking, Decreased mobility, Decreased activity tolerance, Decreased endurance, Impaired sensation, Decreased balance, Impaired tone  Visit Diagnosis: Difficulty in walking, not elsewhere classified  Unsteadiness on feet  Muscle weakness (generalized)  Paraplegia, incomplete (HCC)     Problem List There are no problems to display for this patient.  Patrina Levering PT, DPT  [facility] 11/13/2020, 12:38 PM  [place] MAIN [facility] SERVICES [address] [place], [place], [address] Phone: [phone]   Fax:  [phone]  Name: Ryan Bush MRN: [id number] Date of Birth: 02/22/1976

## 2020-11-14 ENCOUNTER — Ambulatory Visit: Admit: 2020-11-14 | Discharge: 2020-11-15 | Payer: PRIVATE HEALTH INSURANCE

## 2020-11-14 DIAGNOSIS — Z23 Encounter for immunization: Secondary | ICD-10-CM | POA: Diagnosis not present

## 2020-11-14 DIAGNOSIS — N319 Neuromuscular dysfunction of bladder, unspecified: Secondary | ICD-10-CM | POA: Diagnosis not present

## 2020-11-14 DIAGNOSIS — R69 Illness, unspecified: Secondary | ICD-10-CM | POA: Diagnosis not present

## 2020-11-14 DIAGNOSIS — B20 Human immunodeficiency virus [HIV] disease: Principal | ICD-10-CM

## 2020-11-15 ENCOUNTER — Other Ambulatory Visit: Payer: Self-pay

## 2020-11-15 ENCOUNTER — Ambulatory Visit: Payer: 59

## 2020-11-15 DIAGNOSIS — R2681 Unsteadiness on feet: Secondary | ICD-10-CM | POA: Diagnosis not present

## 2020-11-15 DIAGNOSIS — R262 Difficulty in walking, not elsewhere classified: Secondary | ICD-10-CM

## 2020-11-15 DIAGNOSIS — M6281 Muscle weakness (generalized): Secondary | ICD-10-CM

## 2020-11-15 DIAGNOSIS — G8222 Paraplegia, incomplete: Secondary | ICD-10-CM | POA: Diagnosis not present

## 2020-11-15 NOTE — Therapy (Signed)
[place] MAIN [facility] SERVICES [address] [place], [place], [address] Phone: [phone]   Fax:  [phone]  Physical Therapy Treatment  Patient Details  Name: Ryan Bush MRN: [id number] Date of Birth: 1975-12-05 Referring Provider (PT): Clarise Cruz   Encounter Date: 11/15/2020   PT End of Session - 11/15/20 0856     Visit Number 21    Number of Visits 62    Date for PT Re-Evaluation 01/03/21    Authorization Type [employer]    Authorization Time Period 06/19/20-08/14/20; 7/10 Pn 6/29    PT Start Time 0845    PT Stop Time 0929    PT Time Calculation (min) 44 min    Equipment Utilized During Treatment Gait belt    Activity Tolerance Patient tolerated treatment well;Patient limited by fatigue;No increased pain    Behavior During Therapy WFL for tasks assessed/performed             Past Medical History:  Diagnosis Date   HIV (human immunodeficiency virus infection) ([place])     History reviewed. No pertinent surgical history.  There were no vitals filed for this visit.   Subjective Assessment - 11/15/20 0847     Subjective Patient reports compliance with HEP. Had doctor appointments yesterday.    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 Patient had PT and OT in the hospital for 3 months. He was DC from PT due to his insurance running out and he needed a different kind of PT. He was discharged home and he had HHPT 2 x week for 1 month. He was dischaged from [place] and was supposed to get out patient PT but his insurance was cancelled. He has a new insurance now and has [place].    Limitations Standing;Walking    How long can you sit comfortably? unlimited    How long can you stand comfortably? 1-3 mins with BUE support    How long can you walk comfortably? unable    Currently in Pain? No/denies                      Supine: -hamstring bent knee with leg on PT shoulder 60 seconds x1 trial each LE; deep pressure applied for tension/tone reduction each LE. ; reports LLE more affected than RLE - posterior calf stretch with progressive DF 60 second holds x1 trial each LE. -single knee to chest with progressive hold x 60 each LE -piriformis stretch, stabilization to limb provided with progressive lengthening x60 second holds  -bridge 10x with arms crossed -Heel slide LLE AAROM 10x, RLE AROM 10x    Seated: Hamstring isometric into dynadisc 10x 5 seconds each LE  seated LAQ 10x RLE, AAROM with LLE  Standing  knee immobilizers donned in supine position, transition to sitting EOB with Min A . STS from raised plinth table with standard walker. Upon standing cue for step back ; able to perform with each LE able to self place leg. Cues for reducing demand on arms allowing for LE's to assist in stabilizing self. X3 trials.  Min A to return to seated position due to inability to bend knees with immobilizers on.  -first trial five steps alternating forward backwards LLE and RLE ; static stand 3 minutes after -second trial push walker forward/backwards 4x  -third trial forward/backwards stepping 8x each LE with alternating pattern (most steps  performed to this date)         Pt educated throughout session about proper posture and technique with exercises. Improved exercise technique, movement at target joints, use of target muscles after min to mod verbal, visual, tactile cues  Patient is able to tolerate eight reciprocating steps forward/backwards for first time this session in standing with walker. He is progressing with his ability to stand as well as capacity for LE activation. Patient will continue to progress with functional mobilization with focused interventions. Pt will continue to benefit from skilled therapy in order to address remaining goals/deficits still present at this  time.                       PT Education - 11/15/20 0856     Education Details exercise technique, body mechanics    Person(s) Educated Patient    Methods Explanation;Demonstration;Tactile cues;Verbal cues    Comprehension Returned demonstration;Verbal cues required;Tactile cues required;Verbalized understanding              PT Short Term Goals - 10/11/20 0941       PT SHORT TERM GOAL #1   Title Patient will be independent in home exercise program to improve strength/mobility for better functional independence with ADLs.    Baseline 06/12/20  pt performing HEP 1x/day. 3/30: performing 1x/day, interested in gym membership 4/27: progressing HEP 6/22: HEP compliant    Time 4    Period Weeks    Status Achieved    Target Date 09/13/20      PT SHORT TERM GOAL #2   Title Patient will be able to stand in parallel bars with support of BUE for 30 seconds with adequate weight bearing through BLE.    Baseline 01/03: less than 30s, 06/12/20  pt able to stand in parallel bars 3 mins with min to mod A cueing for posture and knee blocking.    Time 4    Period Weeks    Status Achieved    Target Date 07/10/20               PT Long Term Goals - 10/11/20 0941       PT LONG TERM GOAL #1   Title Patient will be able to stand in parallel bars with support of BUE for 30-60 seconds with CGA with adequate weight bearing through BLE.    Baseline 01/03: less than 30s, 06/12/20  pt able to stand in parallel bars 3 mins with min to mod A cueing for posture and knee blocking. 3/30: 60s with mod-max A +2 with blocking bil feet/knees and TC for quad mm activation 4/27: 1 min 15 seconds with min mod blocking of knees 6/22: 62 seconds with blocking of LE's only    Time 12    Period Weeks    Status Partially Met    Target Date 01/03/21      PT LONG TERM GOAL #2   Title Patient will increase BLE gross strength half a grade as to improve functional strength for increased standing  tolerance and increased ADL ability.    Baseline 06/12/20 requires max assist for most movements impacting left LE, right min to mod assist depending on muscle group. 3/30: R hip flex, knee ext 3-/5, requires max A for LLE mvmt 4/27: R hip flex 3 knee extension 3- L trace 6/22: see note    Time 12    Period Weeks    Status Partially Met    Target  Date 01/03/21      PT LONG TERM GOAL #3   Title Patient will reduce joint contracture in BLE ankle plantarflexion by being able to achieve ankle dorsiflexion to neutral for better functional mobility    Baseline 01/03: RLE patient actively moves it by 4 degrees, unable to actively move LLE into DF, 06/12/20  arom into right DF 6 degrees, left unable, trace contraction dorsal foot with DF attempts on left. 3/30: continues to be limited with achieving neutral, but demo improvements with PROM s/p MT on LLE (~3deg improvement), lacking ~15deg DF on RLE. 4/27: LLE -9 PROM RLE able to AROM to -2 to neutral  with inversion 6/22: able to obtain neutral with overpressure    Time 8    Period Weeks    Status Achieved      PT LONG TERM GOAL #4   Title Patient will be independent with gym program for total body strengthening.    Baseline 4/27: introducing dumbbells 6/22: doing some dumbbell exercises at home.    Time 12    Period Weeks    Status Partially Met    Target Date 01/03/21      PT LONG TERM GOAL #5   Title Patient will increase FOTO score to equal to or greater than 60%    to demonstrate statistically significant improvement in mobility and quality of life.    Baseline 4/27: 42% 6/22: 34%    Time 12    Period Weeks    Status On-going    Target Date 01/03/21      Additional Long Term Goals   Additional Long Term Goals Yes      PT LONG TERM GOAL #6   Title Patient will complete sit <> stand transfer with Mod I and LRAD to improve functional independence for household and community mobility    Baseline able to perform with stabilization to knees  in // bars    Time 12    Period Weeks    Status New    Target Date 01/03/21      PT LONG TERM GOAL #7   Title Patient will ambulate at least 3 feet with min A using LRAD to improve mobility for household and community distances    Baseline -unable to ambulate    Time 12    Period Weeks    Status New    Target Date 01/03/21                   Plan - 11/15/20 1232     Clinical Impression Statement Patient is able to tolerate eight reciprocating steps forward/backwards for first time this session in standing with walker. He is progressing with his ability to stand as well as capacity for LE activation. Patient will continue to progress with functional mobilization with focused interventions. Pt will continue to benefit from skilled therapy in order to address remaining goals/deficits still present at this time.    Personal Factors and Comorbidities Comorbidity 3+;Time since onset of injury/illness/exacerbation    Comorbidities HIV, Urinary retention with bladder stretch injurt, CNS toxoplasmosis (CMS-HCC), Paraplegia, incomplete, diabetic    Examination-Activity Limitations Bathing    Examination-Participation Restrictions Driving    Stability/Clinical Decision Making Evolving/Moderate complexity    Rehab Potential Fair    PT Frequency 2x / week    PT Duration 12 weeks    PT Treatment/Interventions ADLs/Self Care Home Management;Electrical Stimulation;Moist Heat;Gait training;Stair training;Functional mobility training;Therapeutic activities;Therapeutic exercise;Balance training;Neuromuscular re-education;Wheelchair mobility training;Manual techniques;Passive range of motion;Energy  conservation;Orthotic Fit/Training;Biofeedback;Cryotherapy;Ultrasound;DME Instruction;Dry needling;Vestibular;Splinting;Patient/family education;Visual/perceptual remediation/compensation    PT Next Visit Plan Continue with progressive LE strengthening in supine/seated position and continue with Standing  balance/endurance in Parallel bars.    PT Home Exercise Plan No updates this session;    Consulted and Agree with Plan of Care Patient             Patient will benefit from skilled therapeutic intervention in order to improve the following deficits and impairments:  Decreased range of motion, Decreased strength, Impaired flexibility, Difficulty walking, Decreased mobility, Decreased activity tolerance, Decreased endurance, Impaired sensation, Decreased balance, Impaired tone  Visit Diagnosis: Difficulty in walking, not elsewhere classified  Unsteadiness on feet  Muscle weakness (generalized)     Problem List There are no problems to display for this patient.   Janna Arch, PT, DPT  11/15/2020, 12:33 PM  [place] MAIN [facility] SERVICES [address] [place], [place], [address] Phone: [phone]   Fax:  [phone]  Name: Madison Albea MRN: [id number] Date of Birth: 1976-02-08

## 2020-11-20 ENCOUNTER — Ambulatory Visit: Payer: 59 | Attending: Physical Medicine and Rehabilitation

## 2020-11-20 ENCOUNTER — Other Ambulatory Visit: Payer: Self-pay

## 2020-11-20 DIAGNOSIS — R2681 Unsteadiness on feet: Secondary | ICD-10-CM | POA: Insufficient documentation

## 2020-11-20 DIAGNOSIS — M6281 Muscle weakness (generalized): Secondary | ICD-10-CM | POA: Diagnosis not present

## 2020-11-20 DIAGNOSIS — R262 Difficulty in walking, not elsewhere classified: Secondary | ICD-10-CM | POA: Insufficient documentation

## 2020-11-20 NOTE — Therapy (Signed)
[place] MAIN [facility] SERVICES [address] [place], [place], [address] Phone: [phone]   Fax:  [phone]  Physical Therapy Treatment  Patient Details  Name: Ryan Bush MRN: [id number] Date of Birth: 1975-10-27 Referring Provider (PT): Ryan Bush   Encounter Date: 11/20/2020   PT End of Session - 11/20/20 0926     Visit Number 48    Number of Visits 62    Date for PT Re-Evaluation 01/03/21    Authorization Type [employer]    Authorization Time Period 06/19/20-08/14/20; 8/10 Pn 6/29    PT Start Time 0850    PT Stop Time 0929    PT Time Calculation (min) 39 min    Equipment Utilized During Treatment Gait belt    Activity Tolerance Patient tolerated treatment well;Patient limited by fatigue;No increased pain    Behavior During Therapy WFL for tasks assessed/performed             Past Medical History:  Diagnosis Date   HIV (human immunodeficiency virus infection) ([place])     History reviewed. No pertinent surgical history.  There were no vitals filed for this visit.   Subjective Assessment - 11/20/20 0919     Subjective Patient reports compliance with HEP. Wife tried to come today but they had their children which are not allowed in the hospital.    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 Patient had PT and OT in the hospital for 3 months. He was DC from PT due to his insurance running out and he needed a different kind of PT. He was discharged home and he had HHPT 2 x week for 1 month. He was dischaged from [place] and was supposed to get out patient PT but his insurance was cancelled. He has a new insurance now and has [place].    Limitations Standing;Walking    How long can you sit comfortably? unlimited    How long can you stand comfortably? 1-3 mins with BUE support    How long can you walk comfortably?  unable    Currently in Pain? No/denies                     Supine: -hamstring bent knee with leg on PT shoulder 60 seconds x1 trial each LE; deep pressure applied for tension/tone reduction each LE. ; reports LLE more affected than RLE - posterior calf stretch with progressive DF 60 second holds x1 trial each LE. -single knee to chest with progressive hold x 60 each LE -piriformis stretch, stabilization to limb provided with progressive lengthening x60 second holds  -bridge 10x with arms crossed -Heel slide LLE AAROM 10x, RLE AROM 10x   Prone: Hip flexor lengthening 3x30 seconds each LE    Seated: Hamstring isometric into dynadisc 10x 5 seconds each LE  seated LAQ 10x RLE, AAROM with LLE  Standing  knee immobilizers donned in supine position, transition to sitting EOB with Min A . STS from raised plinth table with standard walker. Upon standing cue for step back ; able to perform with each LE able to self place leg. Cues for reducing demand on arms allowing for LE's to assist in stabilizing self. X3 trials.  Min A to return to seated position due to inability to bend knees with immobilizers on.  -first trial 13 steps alternating forward backwards LLE and RLE ;  -  second trial push walker forward/backwards 4x  -third trial forward/backwards stepping 8x each LE with alternating pattern (most steps performed to this date)         Pt educated throughout session about proper posture and technique with exercises. Improved exercise technique, movement at target joints, use of target muscles after min to mod verbal, visual, tactile cues    Patient is able to perform increased steps forwards/backwards this session indicating potential ability to progress to ambulation with assistance next session. Discussed possibility of progression with patient with patient agreeing to attempt next session. Pt will continue to benefit from skilled therapy in order to address remaining goals/deficits  still present at this time.                  PT Education - 11/20/20 0926     Education Details exercise technique, body mechanics    Person(s) Educated Patient    Methods Explanation;Demonstration;Tactile cues;Verbal cues    Comprehension Verbalized understanding;Returned demonstration;Verbal cues required;Tactile cues required              PT Short Term Goals - 10/11/20 0941       PT SHORT TERM GOAL #1   Title Patient will be independent in home exercise program to improve strength/mobility for better functional independence with ADLs.    Baseline 06/12/20  pt performing HEP 1x/day. 3/30: performing 1x/day, interested in gym membership 4/27: progressing HEP 6/22: HEP compliant    Time 4    Period Weeks    Status Achieved    Target Date 09/13/20      PT SHORT TERM GOAL #2   Title Patient will be able to stand in parallel bars with support of BUE for 30 seconds with adequate weight bearing through BLE.    Baseline 01/03: less than 30s, 06/12/20  pt able to stand in parallel bars 3 mins with min to mod A cueing for posture and knee blocking.    Time 4    Period Weeks    Status Achieved    Target Date 07/10/20               PT Long Term Goals - 10/11/20 0941       PT LONG TERM GOAL #1   Title Patient will be able to stand in parallel bars with support of BUE for 30-60 seconds with CGA with adequate weight bearing through BLE.    Baseline 01/03: less than 30s, 06/12/20  pt able to stand in parallel bars 3 mins with min to mod A cueing for posture and knee blocking. 3/30: 60s with mod-max A +2 with blocking bil feet/knees and TC for quad mm activation 4/27: 1 min 15 seconds with min mod blocking of knees 6/22: 62 seconds with blocking of LE's only    Time 12    Period Weeks    Status Partially Met    Target Date 01/03/21      PT LONG TERM GOAL #2   Title Patient will increase BLE gross strength half a grade as to improve functional strength for increased  standing tolerance and increased ADL ability.    Baseline 06/12/20 requires max assist for most movements impacting left LE, right min to mod assist depending on muscle group. 3/30: R hip flex, knee ext 3-/5, requires max A for LLE mvmt 4/27: R hip flex 3 knee extension 3- L trace 6/22: see note    Time 12    Period Weeks    Status  Partially Met    Target Date 01/03/21      PT LONG TERM GOAL #3   Title Patient will reduce joint contracture in BLE ankle plantarflexion by being able to achieve ankle dorsiflexion to neutral for better functional mobility    Baseline 01/03: RLE patient actively moves it by 4 degrees, unable to actively move LLE into DF, 06/12/20  arom into right DF 6 degrees, left unable, trace contraction dorsal foot with DF attempts on left. 3/30: continues to be limited with achieving neutral, but demo improvements with PROM s/p MT on LLE (~3deg improvement), lacking ~15deg DF on RLE. 4/27: LLE -9 PROM RLE able to AROM to -2 to neutral  with inversion 6/22: able to obtain neutral with overpressure    Time 8    Period Weeks    Status Achieved      PT LONG TERM GOAL #4   Title Patient will be independent with gym program for total body strengthening.    Baseline 4/27: introducing dumbbells 6/22: doing some dumbbell exercises at home.    Time 12    Period Weeks    Status Partially Met    Target Date 01/03/21      PT LONG TERM GOAL #5   Title Patient will increase FOTO score to equal to or greater than 60%    to demonstrate statistically significant improvement in mobility and quality of life.    Baseline 4/27: 42% 6/22: 34%    Time 12    Period Weeks    Status On-going    Target Date 01/03/21      Additional Long Term Goals   Additional Long Term Goals Yes      PT LONG TERM GOAL #6   Title Patient will complete sit <> stand transfer with Mod I and LRAD to improve functional independence for household and community mobility    Baseline able to perform with stabilization  to knees in // bars    Time 12    Period Weeks    Status New    Target Date 01/03/21      PT LONG TERM GOAL #7   Title Patient will ambulate at least 3 feet with min A using LRAD to improve mobility for household and community distances    Baseline -unable to ambulate    Time 12    Period Weeks    Status New    Target Date 01/03/21                   Plan - 11/20/20 1244     Clinical Impression Statement Patient is able to perform increased steps forwards/backwards this session indicating potential ability to progress to ambulation with assistance next session. Discussed possibility of progression with patient with patient agreeing to attempt next session. Pt will continue to benefit from skilled therapy in order to address remaining goals/deficits still present at this time    Personal Factors and Comorbidities Comorbidity 3+;Time since onset of injury/illness/exacerbation    Comorbidities HIV, Urinary retention with bladder stretch injurt, CNS toxoplasmosis (CMS-HCC), Paraplegia, incomplete, diabetic    Examination-Activity Limitations Bathing    Examination-Participation Restrictions Driving    Stability/Clinical Decision Making Evolving/Moderate complexity    Rehab Potential Fair    PT Frequency 2x / week    PT Duration 12 weeks    PT Treatment/Interventions ADLs/Self Care Home Management;Electrical Stimulation;Moist Heat;Gait training;Stair training;Functional mobility training;Therapeutic activities;Therapeutic exercise;Balance training;Neuromuscular re-education;Wheelchair mobility training;Manual techniques;Passive range of motion;Energy conservation;Orthotic Fit/Training;Biofeedback;Cryotherapy;Ultrasound;DME Instruction;Dry needling;Vestibular;Splinting;Patient/family  education;Visual/perceptual remediation/compensation    PT Next Visit Plan Continue with progressive LE strengthening in supine/seated position and continue with Standing balance/endurance in Parallel bars.     PT Home Exercise Plan No updates this session;    Consulted and Agree with Plan of Care Patient             Patient will benefit from skilled therapeutic intervention in order to improve the following deficits and impairments:  Decreased range of motion, Decreased strength, Impaired flexibility, Difficulty walking, Decreased mobility, Decreased activity tolerance, Decreased endurance, Impaired sensation, Decreased balance, Impaired tone  Visit Diagnosis: Difficulty in walking, not elsewhere classified  Unsteadiness on feet  Muscle weakness (generalized)     Problem List There are no problems to display for this patient.   Janna Arch, PT, DPT  11/20/2020, 12:46 PM  [place] MAIN [facility] SERVICES [address] [place], [place], [address] Phone: [phone]   Fax:  [phone]  Name: Ryan Bush MRN: [id number] Date of Birth: 17-Feb-1976

## 2020-11-22 ENCOUNTER — Other Ambulatory Visit: Payer: Self-pay

## 2020-11-22 ENCOUNTER — Ambulatory Visit: Payer: 59

## 2020-11-22 DIAGNOSIS — R2681 Unsteadiness on feet: Secondary | ICD-10-CM | POA: Diagnosis not present

## 2020-11-22 DIAGNOSIS — M6281 Muscle weakness (generalized): Secondary | ICD-10-CM

## 2020-11-22 DIAGNOSIS — R339 Retention of urine, unspecified: Secondary | ICD-10-CM | POA: Diagnosis not present

## 2020-11-22 DIAGNOSIS — R262 Difficulty in walking, not elsewhere classified: Secondary | ICD-10-CM

## 2020-11-22 NOTE — Therapy (Signed)
[place] MAIN [facility] SERVICES [address] [place], [place], [address] Phone: [phone]   Fax:  [phone]  Physical Therapy Treatment  Patient Details  Name: Ryan Bush MRN: [id number] Date of Birth: May 14, 1975 Referring Provider (PT): Clarise Cruz   Encounter Date: 11/22/2020   PT End of Session - 11/22/20 1020     Visit Number 72    Number of Visits 62    Date for PT Re-Evaluation 01/03/21    Authorization Type [employer]    Authorization Time Period 06/19/20-08/14/20; 9/10 Pn 6/29    PT Start Time 0845    PT Stop Time 0932    PT Time Calculation (min) 47 min    Equipment Utilized During Treatment Gait belt    Activity Tolerance Patient tolerated treatment well;Patient limited by fatigue;No increased pain    Behavior During Therapy WFL for tasks assessed/performed             Past Medical History:  Diagnosis Date   HIV (human immunodeficiency virus infection) ([place])     History reviewed. No pertinent surgical history.  There were no vitals filed for this visit.   Subjective Assessment - 11/22/20 1015     Subjective Patient is eager to attempt ambulation today. Reports his wife is waiting in the car with his daughter. Has been compliant with HEP.    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 Patient had PT and OT in the hospital for 3 months. He was DC from PT due to his insurance running out and he needed a different kind of PT. He was discharged home and he had HHPT 2 x week for 1 month. He was dischaged from [place] and was supposed to get out patient PT but his insurance was cancelled. He has a new insurance now and has [place].    Limitations Standing;Walking    How long can you sit comfortably? unlimited    How long can you stand comfortably? 1-3 mins with BUE support    How long can you walk  comfortably? unable    Currently in Pain? No/denies                   Supine: -hamstring bent knee with leg on PT shoulder 60 seconds x1 trial each LE; deep pressure applied for tension/tone reduction each LE. ; reports LLE more affected than RLE - posterior calf stretch with progressive DF 60 second holds x1 trial each LE. -single knee to chest with progressive hold x 60 each LE -piriformis stretch, stabilization to limb provided with progressive lengthening x60 second holds  -bridge 10x with arms crossed -Heel slide LLE AAROM 10x, RLE AROM 10x     Seated: Hamstring isometric into dynadisc 10x 5 seconds each LE  seated LAQ 10x RLE, AAROM with LLE  RTB row 15x cue for scapular retraction and posterior chain activation RTB adduction 15x each LE; very challenging against resistance  RTB ER 15x, cue for keeping elbows tucked to side  Standing / walking knee immobilizers and lite gait belt donned in supine position, transition to sitting EOB with Min A . Sitting applied lite gait belt to machine and tightened to proper tautness with assistance x 2.  STS from raised plinth table with standard walker. Upon standing cue for step back ; able to perform with each LE able to self place leg. Upon standing  take step forward pick up and push walker forward, then step again, repeat x 11 steps each LE, one PT assist with pushing lite gait one PT assist in walker placement. CGA x 2. Cues for foot placement and sequencing. Assisted back to wheelchair due to fatigue.    Pt educated throughout session about proper posture and technique with exercises. Improved exercise technique, movement at target joints, use of target muscles after min to mod verbal, visual, tactile cues   Patient ambulated for first time this session with use of lite gait and x2 assistance for 11 steps each LE. Patient and this therapist is exteremly pleased with this progression. Patient is highly motivated and requests to  attept ambulation again next session. Pt will continue to benefit from skilled therapy in order to address remaining goals/deficits still present at this time                PT Education - 11/22/20 1015     Education Details exercise technique, body mechanics    Person(s) Educated Patient    Methods Explanation;Demonstration;Tactile cues;Verbal cues    Comprehension Verbalized understanding;Returned demonstration;Verbal cues required;Tactile cues required              PT Short Term Goals - 10/11/20 0941       PT SHORT TERM GOAL #1   Title Patient will be independent in home exercise program to improve strength/mobility for better functional independence with ADLs.    Baseline 06/12/20  pt performing HEP 1x/day. 3/30: performing 1x/day, interested in gym membership 4/27: progressing HEP 6/22: HEP compliant    Time 4    Period Weeks    Status Achieved    Target Date 09/13/20      PT SHORT TERM GOAL #2   Title Patient will be able to stand in parallel bars with support of BUE for 30 seconds with adequate weight bearing through BLE.    Baseline 01/03: less than 30s, 06/12/20  pt able to stand in parallel bars 3 mins with min to mod A cueing for posture and knee blocking.    Time 4    Period Weeks    Status Achieved    Target Date 07/10/20               PT Long Term Goals - 10/11/20 0941       PT LONG TERM GOAL #1   Title Patient will be able to stand in parallel bars with support of BUE for 30-60 seconds with CGA with adequate weight bearing through BLE.    Baseline 01/03: less than 30s, 06/12/20  pt able to stand in parallel bars 3 mins with min to mod A cueing for posture and knee blocking. 3/30: 60s with mod-max A +2 with blocking bil feet/knees and TC for quad mm activation 4/27: 1 min 15 seconds with min mod blocking of knees 6/22: 62 seconds with blocking of LE's only    Time 12    Period Weeks    Status Partially Met    Target Date 01/03/21      PT  LONG TERM GOAL #2   Title Patient will increase BLE gross strength half a grade as to improve functional strength for increased standing tolerance and increased ADL ability.    Baseline 06/12/20 requires max assist for most movements impacting left LE, right min to mod assist depending on muscle group. 3/30: R hip flex, knee ext 3-/5, requires max A for LLE mvmt 4/27: R hip  flex 3 knee extension 3- L trace 6/22: see note    Time 12    Period Weeks    Status Partially Met    Target Date 01/03/21      PT LONG TERM GOAL #3   Title Patient will reduce joint contracture in BLE ankle plantarflexion by being able to achieve ankle dorsiflexion to neutral for better functional mobility    Baseline 01/03: RLE patient actively moves it by 4 degrees, unable to actively move LLE into DF, 06/12/20  arom into right DF 6 degrees, left unable, trace contraction dorsal foot with DF attempts on left. 3/30: continues to be limited with achieving neutral, but demo improvements with PROM s/p MT on LLE (~3deg improvement), lacking ~15deg DF on RLE. 4/27: LLE -9 PROM RLE able to AROM to -2 to neutral  with inversion 6/22: able to obtain neutral with overpressure    Time 8    Period Weeks    Status Achieved      PT LONG TERM GOAL #4   Title Patient will be independent with gym program for total body strengthening.    Baseline 4/27: introducing dumbbells 6/22: doing some dumbbell exercises at home.    Time 12    Period Weeks    Status Partially Met    Target Date 01/03/21      PT LONG TERM GOAL #5   Title Patient will increase FOTO score to equal to or greater than 60%    to demonstrate statistically significant improvement in mobility and quality of life.    Baseline 4/27: 42% 6/22: 34%    Time 12    Period Weeks    Status On-going    Target Date 01/03/21      Additional Long Term Goals   Additional Long Term Goals Yes      PT LONG TERM GOAL #6   Title Patient will complete sit <> stand transfer with Mod I  and LRAD to improve functional independence for household and community mobility    Baseline able to perform with stabilization to knees in // bars    Time 12    Period Weeks    Status New    Target Date 01/03/21      PT LONG TERM GOAL #7   Title Patient will ambulate at least 3 feet with min A using LRAD to improve mobility for household and community distances    Baseline -unable to ambulate    Time 12    Period Weeks    Status New    Target Date 01/03/21                   Plan - 11/22/20 1042     Clinical Impression Statement Patient ambulated for first time this session with use of lite gait and x2 assistance for 11 steps each LE. Patient and this therapist is exteremly pleased with this progression. Patient is highly motivated and requests to attept ambulation again next session. Pt will continue to benefit from skilled therapy in order to address remaining goals/deficits still present at this time    Personal Factors and Comorbidities Comorbidity 3+;Time since onset of injury/illness/exacerbation    Comorbidities HIV, Urinary retention with bladder stretch injurt, CNS toxoplasmosis (CMS-HCC), Paraplegia, incomplete, diabetic    Examination-Activity Limitations Bathing    Examination-Participation Restrictions Driving    Stability/Clinical Decision Making Evolving/Moderate complexity    Rehab Potential Fair    PT Frequency 2x / week    PT  Duration 12 weeks    PT Treatment/Interventions ADLs/Self Care Home Management;Electrical Stimulation;Moist Heat;Gait training;Stair training;Functional mobility training;Therapeutic activities;Therapeutic exercise;Balance training;Neuromuscular re-education;Wheelchair mobility training;Manual techniques;Passive range of motion;Energy conservation;Orthotic Fit/Training;Biofeedback;Cryotherapy;Ultrasound;DME Instruction;Dry needling;Vestibular;Splinting;Patient/family education;Visual/perceptual remediation/compensation    PT Next Visit  Plan Continue with progressive LE strengthening in supine/seated position and continue with Standing balance/endurance in Parallel bars.    PT Home Exercise Plan No updates this session;    Consulted and Agree with Plan of Care Patient             Patient will benefit from skilled therapeutic intervention in order to improve the following deficits and impairments:  Decreased range of motion, Decreased strength, Impaired flexibility, Difficulty walking, Decreased mobility, Decreased activity tolerance, Decreased endurance, Impaired sensation, Decreased balance, Impaired tone  Visit Diagnosis: Difficulty in walking, not elsewhere classified  Unsteadiness on feet  Muscle weakness (generalized)     Problem List There are no problems to display for this patient.  Janna Arch, PT, DPT  11/22/2020, 10:44 AM  [place] MAIN [facility] SERVICES [address] [place], [place], [address] Phone: [phone]   Fax:  [phone]  Name: Donis Pinder MRN: [id number] Date of Birth: [birth year]

## 2020-11-27 ENCOUNTER — Ambulatory Visit: Payer: 59

## 2020-11-27 ENCOUNTER — Other Ambulatory Visit: Payer: Self-pay

## 2020-11-27 DIAGNOSIS — R262 Difficulty in walking, not elsewhere classified: Secondary | ICD-10-CM

## 2020-11-27 DIAGNOSIS — R2681 Unsteadiness on feet: Secondary | ICD-10-CM

## 2020-11-27 DIAGNOSIS — M6281 Muscle weakness (generalized): Secondary | ICD-10-CM

## 2020-11-27 NOTE — Therapy (Signed)
[place] MAIN [facility] SERVICES [address] [place], [place], [address] Phone: [phone]   Fax:  [phone]  Physical Therapy Treatment Physical Therapy Progress Note   Dates of reporting period  10/18/20   to   11/27/20   Patient Details  Name: Ryan Bush MRN: [id number] Date of Birth: [birth year] Referring Provider (PT): Clarise Cruz   Encounter Date: 11/27/2020   PT End of Session - 11/27/20 0915     Visit Number 50    Number of Visits 50    Date for PT Re-Evaluation 01/03/21    Authorization Type [employer]    Authorization Time Period 06/19/20-08/14/20; next sessoin 1/10 PN 11/27/20    PT Start Time 0845    PT Stop Time 0928    PT Time Calculation (min) 43 min    Equipment Utilized During Treatment Gait belt    Activity Tolerance Patient tolerated treatment well;Patient limited by fatigue;No increased pain    Behavior During Therapy WFL for tasks assessed/performed             Past Medical History:  Diagnosis Date   HIV (human immunodeficiency virus infection) ([place])     History reviewed. No pertinent surgical history.  There were no vitals filed for this visit.   Subjective Assessment - 11/27/20 1032     Subjective Patient reports compliance with HEP. Is eager to walk again today.    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 Patient had PT and OT in the hospital for 3 months. He was DC from PT due to his insurance running out and he needed a different kind of PT. He was discharged home and he had HHPT 2 x week for 1 month. He was dischaged from [place] and was supposed to get out patient PT but his insurance was cancelled. He has a new insurance now and has [place].    Limitations Standing;Walking    How long can you sit comfortably? unlimited    How long can you stand comfortably? 1-3 mins with BUE support     How long can you walk comfortably? unable    Currently in Pain? No/denies                   Goals:  Stand 60 seconds with adequate weight bearing through BLE: met with knee immobilizers  BLE strength Gym program: works with weights at home  STS with mod I : requires knee immobilizers FOTO: 56%  Ambulate 3 ft with min a using LRAD: using LiteGait and RW and knee immobilizers: 10 ft        Supine: -hamstring bent knee with leg on PT shoulder 60 seconds x1 trial each LE; deep pressure applied for tension/tone reduction each LE. ; reports LLE more affected than RLE - posterior calf stretch with progressive DF 60 second holds x1 trial each LE. -single knee to chest with progressive hold x 60 each LE -piriformis stretch, stabilization to limb provided with progressive lengthening x60 second holds        Seated: Hamstring isometric into dynadisc 10x 5 seconds each LE  seated LAQ 10x RLE, AAROM with LLE  RTB row 15x cue for scapular retraction and posterior chain activation RTB adduction 15x each LE; very challenging against resistance RTB UE ER 15x, cue for keeping elbows tucked to side   Standing / walking knee immobilizers and  lite gait belt donned in supine position, transition to sitting EOB with Min A . Sitting applied lite gait belt to machine and tightened to proper tautness with assistance x 2. STS from raised plinth table with standard walker. Upon standing cue for step back ; able to perform with each LE able to self place leg. Upon standing take step forward pick up and push walker forward, then step again, repeat ;  10 ft total;  one PT assist with pushing lite gait one PT assist in walker placement. CGA x 2. Cues for foot placement and sequencing. Assisted back to wheelchair once he is unable to progress his LLE any further.      Pt educated throughout session about proper posture and technique with exercises. Improved exercise technique, movement at target  joints, use of target muscles after min to mod verbal, visual, tactile cues  Patient's condition has the potential to improve in response to therapy. Maximum improvement is yet to be obtained. The anticipated improvement is attainable and reasonable in a generally predictable time.  Patient reports he wants to continue working on his ambulation.    Patient demonstrates excellent progression towards goals at this time, he is able to ambulate with utilization litegait, RW, and x 2 assistance with knee immobilizers. Patient ambulated 10 ft this session; longest duration to date. He has met his static standing goals with his knee immobilizers however does continue to have high dependence on Ue's for stabilization at this time. Patient's condition has the potential to improve in response to therapy. Maximum improvement is yet to be obtained. The anticipated improvement is attainable and reasonable in a generally predictable time. Pt will continue to benefit from skilled therapy in order to address remaining goals/deficits still present at this time                 PT Education - 11/27/20 1032     Education Details goals, progress note, ambulation with Lite Gait    Person(s) Educated Patient    Methods Explanation;Demonstration;Tactile cues;Verbal cues    Comprehension Verbalized understanding;Returned demonstration;Verbal cues required;Tactile cues required              PT Short Term Goals - 10/11/20 0941       PT SHORT TERM GOAL #1   Title Patient will be independent in home exercise program to improve strength/mobility for better functional independence with ADLs.    Baseline 06/12/20  pt performing HEP 1x/day. 3/30: performing 1x/day, interested in gym membership 4/27: progressing HEP 6/22: HEP compliant    Time 4    Period Weeks    Status Achieved    Target Date 09/13/20      PT SHORT TERM GOAL #2   Title Patient will be able to stand in parallel bars with support of BUE  for 30 seconds with adequate weight bearing through BLE.    Baseline 01/03: less than 30s, 06/12/20  pt able to stand in parallel bars 3 mins with min to mod A cueing for posture and knee blocking.    Time 4    Period Weeks    Status Achieved    Target Date 07/10/20               PT Long Term Goals - 11/27/20 1034       PT LONG TERM GOAL #1   Title Patient will be able to stand in parallel bars with support of BUE for 30-60 seconds with CGA with adequate weight  bearing through BLE.    Baseline 01/03: less than 30s, 06/12/20  pt able to stand in parallel bars 3 mins with min to mod A cueing for posture and knee blocking. 3/30: 60s with mod-max A +2 with blocking bil feet/knees and TC for quad mm activation 4/27: 1 min 15 seconds with min mod blocking of knees 6/22: 62 seconds with blocking of LE's only 8/8: able to perform with knee immobilizers    Time 12    Period Weeks    Status Achieved      PT LONG TERM GOAL #2   Title Patient will increase BLE gross strength half a grade as to improve functional strength for increased standing tolerance and increased ADL ability.    Baseline 06/12/20 requires max assist for most movements impacting left LE, right min to mod assist depending on muscle group. 3/30: R hip flex, knee ext 3-/5, requires max A for LLE mvmt 4/27: R hip flex 3 knee extension 3- L trace 6/22: see note    Time 12    Period Weeks    Status Partially Met    Target Date 01/03/21      PT LONG TERM GOAL #3   Title Patient will reduce joint contracture in BLE ankle plantarflexion by being able to achieve ankle dorsiflexion to neutral for better functional mobility    Baseline 01/03: RLE patient actively moves it by 4 degrees, unable to actively move LLE into DF, 06/12/20  arom into right DF 6 degrees, left unable, trace contraction dorsal foot with DF attempts on left. 3/30: continues to be limited with achieving neutral, but demo improvements with PROM s/p MT on LLE (~3deg  improvement), lacking ~15deg DF on RLE. 4/27: LLE -9 PROM RLE able to AROM to -2 to neutral  with inversion 6/22: able to obtain neutral with overpressure    Time 8    Period Weeks    Status Achieved      PT LONG TERM GOAL #4   Title Patient will be independent with gym program for total body strengthening.    Baseline 4/27: introducing dumbbells 6/22: doing some dumbbell exercises at home. 8/8: compliant with dumbbell exercises    Time 12    Period Weeks    Status Achieved      PT LONG TERM GOAL #5   Title Patient will increase FOTO score to equal to or greater than 60%    to demonstrate statistically significant improvement in mobility and quality of life.    Baseline 4/27: 42% 6/22: 34% 8/8: 56%    Time 12    Period Weeks    Status Partially Met    Target Date 01/03/21      PT LONG TERM GOAL #6   Title Patient will complete sit <> stand transfer with Mod I and LRAD to improve functional independence for household and community mobility    Baseline able to perform with stabilization to knees in // bars 8/8: needs assistance with knee immobilizers    Time 12    Period Weeks    Status On-going    Target Date 01/03/21      PT LONG TERM GOAL #7   Title Patient will ambulate at least 3 feet with min A using LRAD to improve mobility for household and community distances    Baseline -unable to ambulate 8/8: 10 ft with LiteGait, RW,and knee immobilizers    Time 12    Period Weeks    Status Partially Met  Target Date 01/03/21                   Plan - 11/27/20 1041     Clinical Impression Statement Patient demonstrates excellent progression towards goals at this time, he is able to ambulate with utilization litegait, RW, and x 2 assistance with knee immobilizers. Patient ambulated 10 ft this session; longest duration to date. He has met his static standing goals with his knee immobilizers however does continue to have high dependence on Ue's for stabilization at this time.  Patient's condition has the potential to improve in response to therapy. Maximum improvement is yet to be obtained. The anticipated improvement is attainable and reasonable in a generally predictable time. Pt will continue to benefit from skilled therapy in order to address remaining goals/deficits still present at this time    Personal Factors and Comorbidities Comorbidity 3+;Time since onset of injury/illness/exacerbation    Comorbidities HIV, Urinary retention with bladder stretch injurt, CNS toxoplasmosis (CMS-HCC), Paraplegia, incomplete, diabetic    Examination-Activity Limitations Bathing    Examination-Participation Restrictions Driving    Stability/Clinical Decision Making Evolving/Moderate complexity    Rehab Potential Fair    PT Frequency 2x / week    PT Duration 12 weeks    PT Treatment/Interventions ADLs/Self Care Home Management;Electrical Stimulation;Moist Heat;Gait training;Stair training;Functional mobility training;Therapeutic activities;Therapeutic exercise;Balance training;Neuromuscular re-education;Wheelchair mobility training;Manual techniques;Passive range of motion;Energy conservation;Orthotic Fit/Training;Biofeedback;Cryotherapy;Ultrasound;DME Instruction;Dry needling;Vestibular;Splinting;Patient/family education;Visual/perceptual remediation/compensation    PT Next Visit Plan Continue with progressive LE strengthening in supine/seated position and continue with Standing balance/endurance in Parallel bars.    PT Home Exercise Plan No updates this session;    Consulted and Agree with Plan of Care Patient             Patient will benefit from skilled therapeutic intervention in order to improve the following deficits and impairments:  Decreased range of motion, Decreased strength, Impaired flexibility, Difficulty walking, Decreased mobility, Decreased activity tolerance, Decreased endurance, Impaired sensation, Decreased balance, Impaired tone  Visit  Diagnosis: Difficulty in walking, not elsewhere classified  Unsteadiness on feet  Muscle weakness (generalized)     Problem List There are no problems to display for this patient.   Janna Arch, PT, DPT  11/27/2020, 10:43 AM  [place] MAIN [facility] SERVICES [address] [place], [place], [address] Phone: [phone]   Fax:  [phone]  Name: Ryan Bush MRN: [id number] Date of Birth: 07/30/1975

## 2020-11-29 ENCOUNTER — Ambulatory Visit: Payer: 59

## 2020-11-29 ENCOUNTER — Other Ambulatory Visit: Payer: Self-pay

## 2020-11-29 DIAGNOSIS — M6281 Muscle weakness (generalized): Secondary | ICD-10-CM | POA: Diagnosis not present

## 2020-11-29 DIAGNOSIS — R2681 Unsteadiness on feet: Secondary | ICD-10-CM

## 2020-11-29 DIAGNOSIS — R262 Difficulty in walking, not elsewhere classified: Secondary | ICD-10-CM

## 2020-11-29 NOTE — Therapy (Signed)
[place] MAIN [facility] SERVICES [address] [place], [place], [address] Phone: [phone]   Fax:  [phone]  Physical Therapy Treatment  Patient Details  Name: Ryan Bush MRN: [id number] Date of Birth: December 19, 1975 Referring Provider (PT): Clarise Cruz   Encounter Date: 11/29/2020   PT End of Session - 11/29/20 0836     Visit Number 51    Number of Visits 62    Date for PT Re-Evaluation 01/03/21    Authorization Type [employer]    Authorization Time Period 06/19/20-08/14/20; 1/10 PN 11/27/20    PT Start Time 0845    PT Stop Time 0929    PT Time Calculation (min) 44 min    Equipment Utilized During Treatment Gait belt    Activity Tolerance Patient tolerated treatment well;Patient limited by fatigue;No increased pain    Behavior During Therapy WFL for tasks assessed/performed             Past Medical History:  Diagnosis Date   HIV (human immunodeficiency virus infection) ([place])     History reviewed. No pertinent surgical history.  There were no vitals filed for this visit.   Subjective Assessment - 11/29/20 0853     Subjective Patient reports no falls or LOB, has been compliant with HEP. Is eager to participate with walking today.    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 Patient had PT and OT in the hospital for 3 months. He was DC from PT due to his insurance running out and he needed a different kind of PT. He was discharged home and he had HHPT 2 x week for 1 month. He was dischaged from [place] and was supposed to get out patient PT but his insurance was cancelled. He has a new insurance now and has [place].    Limitations Standing;Walking    How long can you sit comfortably? unlimited    How long can you stand comfortably? 1-3 mins with BUE support    How long can you walk comfortably? unable     Currently in Pain? No/denies                     Supine: -hamstring bent knee with leg on PT shoulder 60 seconds x1 trial each LE; deep pressure applied for tension/tone reduction each LE. ; reports LLE more affected than RLE - posterior calf stretch with progressive DF 60 second holds x1 trial each LE. -single knee to chest with progressive hold x 60 each LE -piriformis stretch, stabilization to limb provided with progressive lengthening x60 second holds    Seated: Lateral trunk reach pick up cone x4 trials x 4 trials   seated LAQ 10x RLE, AAROM with LLE  RTB row 15x cue for scapular retraction and posterior chain activation RTB adduction 12x each LE; very challenging against resistance RTB UE ER 15x, cue for keeping elbows tucked to side   Standing / walking knee immobilizers and lite gait belt donned in supine position, transition to sitting EOB with Min A . Sitting applied lite gait belt to machine and tightened to proper tautness with assistance x 2. STS from raised plinth table with standard walker. Upon standing cue for step back ; able to perform with each LE able to self place leg. Upon standing take step forward pick up and push walker forward, then step again, repeat ;  10 ft total;  one PT assist with pushing lite gait one PT assist in walker placement. CGA x 2. Cues for foot placement and sequencing. Assisted back to wheelchair once he is unable to progress his LLE any further.      Pt educated throughout session about proper posture and technique with exercises. Improved exercise technique, movement at target joints, use of target muscles after min to mod verbal, visual, tactile cues   Patient is highly motivated throughout physical therapy session. He is initially challenged with standing this session but was able to correct for instability with change of Lite Gait position. Patient is very fatigued by end of session and is eager to continue progressing with  functional ambulation. Pt will continue to benefit from skilled therapy in order to address remaining goals/deficits still present at this time                    PT Education - 11/29/20 0835     Education Details exercise technique, body mechanics, Lite Gait    Person(s) Educated Patient    Methods Explanation;Demonstration;Tactile cues;Verbal cues    Comprehension Verbalized understanding;Returned demonstration;Verbal cues required;Tactile cues required              PT Short Term Goals - 10/11/20 0941       PT SHORT TERM GOAL #1   Title Patient will be independent in home exercise program to improve strength/mobility for better functional independence with ADLs.    Baseline 06/12/20  pt performing HEP 1x/day. 3/30: performing 1x/day, interested in gym membership 4/27: progressing HEP 6/22: HEP compliant    Time 4    Period Weeks    Status Achieved    Target Date 09/13/20      PT SHORT TERM GOAL #2   Title Patient will be able to stand in parallel bars with support of BUE for 30 seconds with adequate weight bearing through BLE.    Baseline 01/03: less than 30s, 06/12/20  pt able to stand in parallel bars 3 mins with min to mod A cueing for posture and knee blocking.    Time 4    Period Weeks    Status Achieved    Target Date 07/10/20               PT Long Term Goals - 11/27/20 1034       PT LONG TERM GOAL #1   Title Patient will be able to stand in parallel bars with support of BUE for 30-60 seconds with CGA with adequate weight bearing through BLE.    Baseline 01/03: less than 30s, 06/12/20  pt able to stand in parallel bars 3 mins with min to mod A cueing for posture and knee blocking. 3/30: 60s with mod-max A +2 with blocking bil feet/knees and TC for quad mm activation 4/27: 1 min 15 seconds with min mod blocking of knees 6/22: 62 seconds with blocking of LE's only 8/8: able to perform with knee immobilizers    Time 12    Period Weeks    Status  Achieved      PT LONG TERM GOAL #2   Title Patient will increase BLE gross strength half a grade as to improve functional strength for increased standing tolerance and increased ADL ability.    Baseline 06/12/20 requires max assist for most movements impacting left LE, right min to mod assist depending on muscle group. 3/30: R hip flex, knee ext 3-/5, requires max A for  LLE mvmt 4/27: R hip flex 3 knee extension 3- L trace 6/22: see note    Time 12    Period Weeks    Status Partially Met    Target Date 01/03/21      PT LONG TERM GOAL #3   Title Patient will reduce joint contracture in BLE ankle plantarflexion by being able to achieve ankle dorsiflexion to neutral for better functional mobility    Baseline 01/03: RLE patient actively moves it by 4 degrees, unable to actively move LLE into DF, 06/12/20  arom into right DF 6 degrees, left unable, trace contraction dorsal foot with DF attempts on left. 3/30: continues to be limited with achieving neutral, but demo improvements with PROM s/p MT on LLE (~3deg improvement), lacking ~15deg DF on RLE. 4/27: LLE -9 PROM RLE able to AROM to -2 to neutral  with inversion 6/22: able to obtain neutral with overpressure    Time 8    Period Weeks    Status Achieved      PT LONG TERM GOAL #4   Title Patient will be independent with gym program for total body strengthening.    Baseline 4/27: introducing dumbbells 6/22: doing some dumbbell exercises at home. 8/8: compliant with dumbbell exercises    Time 12    Period Weeks    Status Achieved      PT LONG TERM GOAL #5   Title Patient will increase FOTO score to equal to or greater than 60%    to demonstrate statistically significant improvement in mobility and quality of life.    Baseline 4/27: 42% 6/22: 34% 8/8: 56%    Time 12    Period Weeks    Status Partially Met    Target Date 01/03/21      PT LONG TERM GOAL #6   Title Patient will complete sit <> stand transfer with Mod I and LRAD to improve  functional independence for household and community mobility    Baseline able to perform with stabilization to knees in // bars 8/8: needs assistance with knee immobilizers    Time 12    Period Weeks    Status On-going    Target Date 01/03/21      PT LONG TERM GOAL #7   Title Patient will ambulate at least 3 feet with min A using LRAD to improve mobility for household and community distances    Baseline -unable to ambulate 8/8: 10 ft with LiteGait, RW,and knee immobilizers    Time 12    Period Weeks    Status Partially Met    Target Date 01/03/21                   Plan - 11/29/20 0929     Clinical Impression Statement Patient is highly motivated throughout physical therapy session. He is initially challenged with standing this session but was able to correct for instability with change of Lite Gait position. Patient is very fatigued by end of session and is eager to continue progressing with functional ambulation. Pt will continue to benefit from skilled therapy in order to address remaining goals/deficits still present at this time    Personal Factors and Comorbidities Comorbidity 3+;Time since onset of injury/illness/exacerbation    Comorbidities HIV, Urinary retention with bladder stretch injurt, CNS toxoplasmosis (CMS-HCC), Paraplegia, incomplete, diabetic    Examination-Activity Limitations Bathing    Examination-Participation Restrictions Driving    Stability/Clinical Decision Making Evolving/Moderate complexity    Rehab Potential Fair  PT Frequency 2x / week    PT Duration 12 weeks    PT Treatment/Interventions ADLs/Self Care Home Management;Electrical Stimulation;Moist Heat;Gait training;Stair training;Functional mobility training;Therapeutic activities;Therapeutic exercise;Balance training;Neuromuscular re-education;Wheelchair mobility training;Manual techniques;Passive range of motion;Energy conservation;Orthotic Fit/Training;Biofeedback;Cryotherapy;Ultrasound;DME  Instruction;Dry needling;Vestibular;Splinting;Patient/family education;Visual/perceptual remediation/compensation    PT Next Visit Plan Continue with progressive LE strengthening in supine/seated position and continue with Standing balance/endurance in Parallel bars.    PT Home Exercise Plan No updates this session;    Consulted and Agree with Plan of Care Patient             Patient will benefit from skilled therapeutic intervention in order to improve the following deficits and impairments:  Decreased range of motion, Decreased strength, Impaired flexibility, Difficulty walking, Decreased mobility, Decreased activity tolerance, Decreased endurance, Impaired sensation, Decreased balance, Impaired tone  Visit Diagnosis: Difficulty in walking, not elsewhere classified  Unsteadiness on feet  Muscle weakness (generalized)     Problem List There are no problems to display for this patient.  Janna Arch, PT, DPT  11/29/2020, 9:32 AM  [place] MAIN [facility] SERVICES [address] [place], [place], [address] Phone: [phone]   Fax:  [phone]  Name: Ryan Bush MRN: [id number] Date of Birth: 20-Nov-1975

## 2020-12-04 ENCOUNTER — Ambulatory Visit: Payer: 59

## 2020-12-04 ENCOUNTER — Other Ambulatory Visit: Payer: Self-pay

## 2020-12-04 DIAGNOSIS — R262 Difficulty in walking, not elsewhere classified: Secondary | ICD-10-CM

## 2020-12-04 DIAGNOSIS — R2681 Unsteadiness on feet: Secondary | ICD-10-CM

## 2020-12-04 DIAGNOSIS — M6281 Muscle weakness (generalized): Secondary | ICD-10-CM | POA: Diagnosis not present

## 2020-12-04 NOTE — Therapy (Signed)
[place] [facility] [address] SERVICES [address] [place], [place], [address] Phone: [phone]   Fax:  [phone]  Physical Therapy Treatment  Patient Details  Name: Ryan Bush MRN: [id number] Date of Birth: Nov 09, 1975 Referring Provider (PT): Clarise Cruz   Encounter Date: 12/04/2020   PT End of Session - 12/04/20 1236     Visit Number [id number]    Number of Visits 62    Date for PT Re-Evaluation 01/03/21    Authorization Type [employer]    Authorization Time Period 06/19/20-08/14/20; 2/10 PN 11/27/20    PT Start Time 0845    PT Stop Time 0929    PT Time Calculation (min) 44 min    Equipment Utilized During Treatment Gait belt    Activity Tolerance Patient tolerated treatment well;Patient limited by fatigue;No increased pain    Behavior During Therapy WFL for tasks assessed/performed             Past Medical History:  Diagnosis Date   HIV (human immunodeficiency virus infection) ([facility])     History reviewed. No pertinent surgical history.  There were no vitals filed for this visit.   Subjective Assessment - 12/04/20 1235     Subjective Patient is excited for ambulation again today. Has been compliant with HEP, no falls or LOB since last session. No pain reported.    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 Patient had PT and OT in the hospital for 3 months. He was DC from PT due to his insurance running out and he needed a different kind of PT. He was discharged home and he had HHPT 2 x week for 1 month. He was dischaged from [facility] and was supposed to get out patient PT but his insurance was cancelled. He has a new insurance now and has [place].    Limitations Standing;Walking    How long can you sit comfortably? unlimited    How long can you stand comfortably? 1-3 mins with BUE support    How long can you walk  comfortably? unable    Currently in Pain? No/denies                 Supine: -hamstring bent knee with leg on PT shoulder 60 seconds x1 trial each LE; deep pressure applied for tension/tone reduction each LE. ; reports LLE more affected than RLE - posterior calf stretch with progressive DF 60 second holds x1 trial each LE. -single knee to chest with progressive hold x 60 each LE -piriformis stretch, stabilization to limb provided with progressive lengthening x60 second holds -bridges 10x each side      Seated: RTB straight arm flexion with abduction 12x RTB row 20x cue for scapular retraction and posterior chain activation RTB adduction 12x each LE; very challenging against resistance RTB abduction 12x each LE RTB UE ER 15x, cue for keeping elbows tucked to side   Standing / walking knee immobilizers and lite gait belt donned in supine position, transition to sitting EOB with Min A . Sitting applied lite gait belt to machine and tightened to proper tautness with assistance x 2. STS from raised plinth table with standard walker. Upon standing cue for step back ; able to perform with each LE able to self place leg. Upon standing take step forward pick up and push walker forward, then step again, repeat ;  20 ft total;  one PT assist with pushing lite gait one PT assist in walker placement. CGA x 2. Cues for foot placement and sequencing. Assisted back to wheelchair once he is unable to progress his LLE any further.      Pt educated throughout session about proper posture and technique with exercises. Improved exercise technique, movement at target joints, use of target muscles after min to mod verbal, visual, tactile cues    Patient is able to ambulate twice his duration of last session. His step lengths are improving bilaterally with decreased episodes of crossing his limbs. Heavy reliance upon Ue's required for ambulation at this time. Use of Lite Gait is required for safety  however patient had decreased reliance upon it with no body weight support provided. Pt will continue to benefit from skilled therapy in order to address remaining goals/deficits still present at this time                    PT Education - 12/04/20 1235     Education Details exercise technique, body mechanics, ambulating with Lite Gait    Person(s) Educated Patient    Methods Explanation;Demonstration;Tactile cues;Verbal cues    Comprehension Verbalized understanding;Returned demonstration;Verbal cues required;Tactile cues required              PT Short Term Goals - 10/11/20 0941       PT SHORT TERM GOAL #1   Title Patient will be independent in home exercise program to improve strength/mobility for better functional independence with ADLs.    Baseline 06/12/20  pt performing HEP 1x/day. 3/30: performing 1x/day, interested in gym membership 4/27: progressing HEP 6/22: HEP compliant    Time 4    Period Weeks    Status Achieved    Target Date 09/13/20      PT SHORT TERM GOAL #2   Title Patient will be able to stand in parallel bars with support of BUE for 30 seconds with adequate weight bearing through BLE.    Baseline 01/03: less than 30s, 06/12/20  pt able to stand in parallel bars 3 mins with min to mod A cueing for posture and knee blocking.    Time 4    Period Weeks    Status Achieved    Target Date 07/10/20               PT Long Term Goals - 11/27/20 1034       PT LONG TERM GOAL #1   Title Patient will be able to stand in parallel bars with support of BUE for 30-60 seconds with CGA with adequate weight bearing through BLE.    Baseline 01/03: less than 30s, 06/12/20  pt able to stand in parallel bars 3 mins with min to mod A cueing for posture and knee blocking. 3/30: 60s with mod-max A +2 with blocking bil feet/knees and TC for quad mm activation 4/27: 1 min 15 seconds with min mod blocking of knees 6/22: 62 seconds with blocking of LE's only 8/8: able  to perform with knee immobilizers    Time 12    Period Weeks    Status Achieved      PT LONG TERM GOAL #2   Title Patient will increase BLE gross strength half a grade as to improve functional strength for increased standing tolerance and increased ADL ability.    Baseline 06/12/20 requires max assist for most movements impacting left LE, right min to mod assist depending on muscle group. 3/30: R hip  flex, knee ext 3-/5, requires max A for LLE mvmt 4/27: R hip flex 3 knee extension 3- L trace 6/22: see note    Time 12    Period Weeks    Status Partially Met    Target Date 01/03/21      PT LONG TERM GOAL #3   Title Patient will reduce joint contracture in BLE ankle plantarflexion by being able to achieve ankle dorsiflexion to neutral for better functional mobility    Baseline 01/03: RLE patient actively moves it by 4 degrees, unable to actively move LLE into DF, 06/12/20  arom into right DF 6 degrees, left unable, trace contraction dorsal foot with DF attempts on left. 3/30: continues to be limited with achieving neutral, but demo improvements with PROM s/p MT on LLE (~3deg improvement), lacking ~15deg DF on RLE. 4/27: LLE -9 PROM RLE able to AROM to -2 to neutral  with inversion 6/22: able to obtain neutral with overpressure    Time 8    Period Weeks    Status Achieved      PT LONG TERM GOAL #4   Title Patient will be independent with gym program for total body strengthening.    Baseline 4/27: introducing dumbbells 6/22: doing some dumbbell exercises at home. 8/8: compliant with dumbbell exercises    Time 12    Period Weeks    Status Achieved      PT LONG TERM GOAL #5   Title Patient will increase FOTO score to equal to or greater than 60%    to demonstrate statistically significant improvement in mobility and quality of life.    Baseline 4/27: 42% 6/22: 34% 8/8: 56%    Time 12    Period Weeks    Status Partially Met    Target Date 01/03/21      PT LONG TERM GOAL #6   Title Patient  will complete sit <> stand transfer with Mod I and LRAD to improve functional independence for household and community mobility    Baseline able to perform with stabilization to knees in // bars 8/8: needs assistance with knee immobilizers    Time 12    Period Weeks    Status On-going    Target Date 01/03/21      PT LONG TERM GOAL #7   Title Patient will ambulate at least 3 feet with min A using LRAD to improve mobility for household and community distances    Baseline -unable to ambulate 8/8: 10 ft with LiteGait, RW,and knee immobilizers    Time 12    Period Weeks    Status Partially Met    Target Date 01/03/21                   Plan - 12/04/20 1241     Clinical Impression Statement Patient is able to ambulate twice his duration of last session. His step lengths are improving bilaterally with decreased episodes of crossing his limbs. Heavy reliance upon Ue's required for ambulation at this time. Use of Lite Gait is required for safety however patient had decreased reliance upon it with no body weight support provided. Pt will continue to benefit from skilled therapy in order to address remaining goals/deficits still present at this time    Personal Factors and Comorbidities Comorbidity 3+;Time since onset of injury/illness/exacerbation    Comorbidities HIV, Urinary retention with bladder stretch injurt, CNS toxoplasmosis (CMS-HCC), Paraplegia, incomplete, diabetic    Examination-Activity Limitations Bathing    Examination-Participation Restrictions Driving  Stability/Clinical Decision Making Evolving/Moderate complexity    Rehab Potential Fair    PT Frequency 2x / week    PT Duration 12 weeks    PT Treatment/Interventions ADLs/Self Care Home Management;Electrical Stimulation;Moist Heat;Gait training;Stair training;Functional mobility training;Therapeutic activities;Therapeutic exercise;Balance training;Neuromuscular re-education;Wheelchair mobility training;Manual  techniques;Passive range of motion;Energy conservation;Orthotic Fit/Training;Biofeedback;Cryotherapy;Ultrasound;DME Instruction;Dry needling;Vestibular;Splinting;Patient/family education;Visual/perceptual remediation/compensation    PT Next Visit Plan Continue with progressive LE strengthening in supine/seated position and continue with Standing balance/endurance in Parallel bars.    PT Home Exercise Plan No updates this session;    Consulted and Agree with Plan of Care Patient             Patient will benefit from skilled therapeutic intervention in order to improve the following deficits and impairments:  Decreased range of motion, Decreased strength, Impaired flexibility, Difficulty walking, Decreased mobility, Decreased activity tolerance, Decreased endurance, Impaired sensation, Decreased balance, Impaired tone  Visit Diagnosis: Difficulty in walking, not elsewhere classified  Unsteadiness on feet  Muscle weakness (generalized)     Problem List There are no problems to display for this patient.  Janna Arch, PT, DPT  12/04/2020, 12:43 PM  [place] [facility] SERVICES [address] [place], [place], [address] Phone: [phone]   Fax:  [phone]  Name: Ryan Bush MRN: [id number] Date of Birth: [birth year]

## 2020-12-06 ENCOUNTER — Other Ambulatory Visit: Payer: Self-pay

## 2020-12-06 ENCOUNTER — Ambulatory Visit: Payer: 59

## 2020-12-06 DIAGNOSIS — M6281 Muscle weakness (generalized): Secondary | ICD-10-CM | POA: Diagnosis not present

## 2020-12-06 DIAGNOSIS — R262 Difficulty in walking, not elsewhere classified: Secondary | ICD-10-CM | POA: Diagnosis not present

## 2020-12-06 DIAGNOSIS — R2681 Unsteadiness on feet: Secondary | ICD-10-CM | POA: Diagnosis not present

## 2020-12-06 NOTE — Therapy (Signed)
[place] [facility] SERVICES [address] [place], [place], [address] Phone: [phone]   Fax:  [phone]  Physical Therapy Treatment  Patient Details  Name: Ryan Bush MRN: [id number] Date of Birth: [birth year] Referring Provider (PT): Clarise Cruz   Encounter Date: 12/06/2020   PT End of Session - 12/06/20 0844     Visit Number 64    Number of Visits 62    Date for PT Re-Evaluation 01/03/21    Authorization Type [employer]    Authorization Time Period 06/19/20-08/14/20; 3/10 PN 11/27/20    PT Start Time 0845    PT Stop Time 0928    PT Time Calculation (min) 43 min    Equipment Utilized During Treatment Gait belt    Activity Tolerance Patient tolerated treatment well;Patient limited by fatigue;No increased pain    Behavior During Therapy WFL for tasks assessed/performed             Past Medical History:  Diagnosis Date   HIV (human immunodeficiency virus infection) ([facility])     History reviewed. No pertinent surgical history.  There were no vitals filed for this visit.   Subjective Assessment - 12/06/20 1142     Subjective Patient presents with wife for session today. Has been compliant with HEP.    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 Patient had PT and OT in the hospital for 3 months. He was DC from PT due to his insurance running out and he needed a different kind of PT. He was discharged home and he had HHPT 2 x week for 1 month. He was dischaged from [place] and was supposed to get out patient PT but his insurance was cancelled. He has a new insurance now and has [place].    Limitations Standing;Walking    How long can you sit comfortably? unlimited    How long can you stand comfortably? 1-3 mins with BUE support    How long can you walk comfortably? unable    Currently in Pain? No/denies                       Supine: -hamstring bent knee with leg on PT shoulder 60 seconds x1 trial each LE; deep pressure applied for tension/tone reduction each LE. ; reports LLE more affected than RLE - posterior calf stretch with progressive DF 60 second holds x1 trial each LE. -single knee to chest with progressive hold x 60 each LE -piriformis stretch, stabilization to limb provided with progressive lengthening x60 second holds      Seated: GTB straight arm flexion with abduction 12x GTB row 20x cue for scapular retraction and posterior chain activation RTB adduction 12x each LE; very challenging against resistance RTB abduction 12x each LE RTB UE ER 15x, cue for keeping elbows tucked to side  lateral cone reach and transfer 4 cones x 4 trials  Standing / walking knee immobilizers and lite gait belt donned in supine position, transition to sitting EOB with Min A . Sitting applied lite gait belt to machine and tightened to proper tautness with assistance x 2. STS from raised plinth table with standard walker. Upon standing cue for step back ; able to perform with each LE able to self place leg. Upon standing take step forward pick up and push walker forward, then step again, repeat ;  40 ft  total;  one PT assist with pushing lite gait one PT assist in walker placement. CGA x 2. Cues for foot placement and sequencing. Assisted back to wheelchair once he is unable to progress his LLE any further.      Pt educated throughout session about proper posture and technique with exercises. Improved exercise technique, movement at target joints, use of target muscles after min to mod verbal, visual, tactile cues     Patient ambulated longest duration to date with knee immobilizers, LiteGait, and walker ambulating 40 ft.  Patient's wife is present allowing for education of knee immobilizers, walker, and mobility. Patient is very fatigued but pleased with his progress. He initially has excessive  plantarflexion in standing due to tone that improves with cues for breathing and body mechanics. Pt will continue to benefit from skilled therapy in order to address remaining goals/deficits still present at this time            PT Education - 12/06/20 0843     Education Details exercise technique, ambulation, body mechanics    Person(s) Educated Patient    Methods Explanation;Demonstration;Tactile cues;Verbal cues    Comprehension Verbalized understanding;Returned demonstration;Verbal cues required;Tactile cues required              PT Short Term Goals - 10/11/20 0941       PT SHORT TERM GOAL #1   Title Patient will be independent in home exercise program to improve strength/mobility for better functional independence with ADLs.    Baseline 06/12/20  pt performing HEP 1x/day. 3/30: performing 1x/day, interested in gym membership 4/27: progressing HEP 6/22: HEP compliant    Time 4    Period Weeks    Status Achieved    Target Date 09/13/20      PT SHORT TERM GOAL #2   Title Patient will be able to stand in parallel bars with support of BUE for 30 seconds with adequate weight bearing through BLE.    Baseline 01/03: less than 30s, 06/12/20  pt able to stand in parallel bars 3 mins with min to mod A cueing for posture and knee blocking.    Time 4    Period Weeks    Status Achieved    Target Date 07/10/20               PT Long Term Goals - 11/27/20 1034       PT LONG TERM GOAL #1   Title Patient will be able to stand in parallel bars with support of BUE for 30-60 seconds with CGA with adequate weight bearing through BLE.    Baseline 01/03: less than 30s, 06/12/20  pt able to stand in parallel bars 3 mins with min to mod A cueing for posture and knee blocking. 3/30: 60s with mod-max A +2 with blocking bil feet/knees and TC for quad mm activation 4/27: 1 min 15 seconds with min mod blocking of knees 6/22: 62 seconds with blocking of LE's only 8/8: able to perform with  knee immobilizers    Time 12    Period Weeks    Status Achieved      PT LONG TERM GOAL #2   Title Patient will increase BLE gross strength half a grade as to improve functional strength for increased standing tolerance and increased ADL ability.    Baseline 06/12/20 requires max assist for most movements impacting left LE, right min to mod assist depending on muscle group. 3/30: R hip flex, knee ext 3-/5, requires max  A for LLE mvmt 4/27: R hip flex 3 knee extension 3- L trace 6/22: see note    Time 12    Period Weeks    Status Partially Met    Target Date 01/03/21      PT LONG TERM GOAL #3   Title Patient will reduce joint contracture in BLE ankle plantarflexion by being able to achieve ankle dorsiflexion to neutral for better functional mobility    Baseline 01/03: RLE patient actively moves it by 4 degrees, unable to actively move LLE into DF, 06/12/20  arom into right DF 6 degrees, left unable, trace contraction dorsal foot with DF attempts on left. 3/30: continues to be limited with achieving neutral, but demo improvements with PROM s/p MT on LLE (~3deg improvement), lacking ~15deg DF on RLE. 4/27: LLE -9 PROM RLE able to AROM to -2 to neutral  with inversion 6/22: able to obtain neutral with overpressure    Time 8    Period Weeks    Status Achieved      PT LONG TERM GOAL #4   Title Patient will be independent with gym program for total body strengthening.    Baseline 4/27: introducing dumbbells 6/22: doing some dumbbell exercises at home. 8/8: compliant with dumbbell exercises    Time 12    Period Weeks    Status Achieved      PT LONG TERM GOAL #5   Title Patient will increase FOTO score to equal to or greater than 60%    to demonstrate statistically significant improvement in mobility and quality of life.    Baseline 4/27: 42% 6/22: 34% 8/8: 56%    Time 12    Period Weeks    Status Partially Met    Target Date 01/03/21      PT LONG TERM GOAL #6   Title Patient will complete  sit <> stand transfer with Mod I and LRAD to improve functional independence for household and community mobility    Baseline able to perform with stabilization to knees in // bars 8/8: needs assistance with knee immobilizers    Time 12    Period Weeks    Status On-going    Target Date 01/03/21      PT LONG TERM GOAL #7   Title Patient will ambulate at least 3 feet with min A using LRAD to improve mobility for household and community distances    Baseline -unable to ambulate 8/8: 10 ft with LiteGait, RW,and knee immobilizers    Time 12    Period Weeks    Status Partially Met    Target Date 01/03/21                   Plan - 12/06/20 1154     Clinical Impression Statement Patient ambulated longest duration to date with knee immobilizers, LiteGait, and walker ambulating 40 ft.  Patient's wife is present allowing for education of knee immobilizers, walker, and mobility. Patient is very fatigued but pleased with his progress. He initially has excessive plantarflexion in standing due to tone that improves with cues for breathing and body mechanics. Pt will continue to benefit from skilled therapy in order to address remaining goals/deficits still present at this time    Personal Factors and Comorbidities Comorbidity 3+;Time since onset of injury/illness/exacerbation    Comorbidities HIV, Urinary retention with bladder stretch injurt, CNS toxoplasmosis (CMS-HCC), Paraplegia, incomplete, diabetic    Examination-Activity Limitations Bathing    Examination-Participation Restrictions Driving    Stability/Clinical  Decision Making Evolving/Moderate complexity    Rehab Potential Fair    PT Frequency 2x / week    PT Duration 12 weeks    PT Treatment/Interventions ADLs/Self Care Home Management;Electrical Stimulation;Moist Heat;Gait training;Stair training;Functional mobility training;Therapeutic activities;Therapeutic exercise;Balance training;Neuromuscular re-education;Wheelchair mobility  training;Manual techniques;Passive range of motion;Energy conservation;Orthotic Fit/Training;Biofeedback;Cryotherapy;Ultrasound;DME Instruction;Dry needling;Vestibular;Splinting;Patient/family education;Visual/perceptual remediation/compensation    PT Next Visit Plan Continue with progressive LE strengthening in supine/seated position and continue with Standing balance/endurance in Parallel bars.    PT Home Exercise Plan No updates this session;    Consulted and Agree with Plan of Care Patient             Patient will benefit from skilled therapeutic intervention in order to improve the following deficits and impairments:  Decreased range of motion, Decreased strength, Impaired flexibility, Difficulty walking, Decreased mobility, Decreased activity tolerance, Decreased endurance, Impaired sensation, Decreased balance, Impaired tone  Visit Diagnosis: Difficulty in walking, not elsewhere classified  Unsteadiness on feet  Muscle weakness (generalized)     Problem Bush There are no problems to display for this patient.  Janna Arch, PT, DPT  12/06/2020, 11:55 AM  [place] [facility] SERVICES [address] [place], [place], [address] Phone: [phone]   Fax:  [phone]  Name: Ryan Bush MRN: [id number] Date of Birth: 05-10-1975

## 2020-12-11 ENCOUNTER — Ambulatory Visit: Payer: 59

## 2020-12-11 ENCOUNTER — Other Ambulatory Visit: Payer: Self-pay

## 2020-12-11 DIAGNOSIS — M6281 Muscle weakness (generalized): Secondary | ICD-10-CM | POA: Diagnosis not present

## 2020-12-11 DIAGNOSIS — R2681 Unsteadiness on feet: Secondary | ICD-10-CM

## 2020-12-11 DIAGNOSIS — R262 Difficulty in walking, not elsewhere classified: Secondary | ICD-10-CM

## 2020-12-11 NOTE — Therapy (Signed)
[place] MAIN [facility] SERVICES [address] [place], [place], [address] Phone: [phone]   Fax:  [phone]  Physical Therapy Treatment  Patient Details  Name: Ryan Bush MRN: [id number] Date of Birth: Mar 06, 1976 Referring Provider (PT): Clarise Cruz   Encounter Date: 12/11/2020   PT End of Session - 12/11/20 1125     Visit Number 59    Number of Visits 94    Date for PT Re-Evaluation 01/03/21    Authorization Type [employer]    Authorization Time Period 06/19/20-08/14/20; 4/10 PN 11/27/20    PT Start Time 0845    PT Stop Time 0928    PT Time Calculation (min) 43 min    Equipment Utilized During Treatment Gait belt    Activity Tolerance Patient tolerated treatment well;Patient limited by fatigue;No increased pain    Behavior During Therapy WFL for tasks assessed/performed             Past Medical History:  Diagnosis Date   HIV (human immunodeficiency virus infection) ([place])     History reviewed. No pertinent surgical history.  There were no vitals filed for this visit.   Subjective Assessment - 12/11/20 0917     Subjective Patient reports having a great weekend, no falls or LOB, has been compliant with HEP.    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 Patient had PT and OT in the hospital for 3 months. He was DC from PT due to his insurance running out and he needed a different kind of PT. He was discharged home and he had HHPT 2 x week for 1 month. He was dischaged from [place] and was supposed to get out patient PT but his insurance was cancelled. He has a new insurance now and has [place].    Limitations Standing;Walking    How long can you sit comfortably? unlimited    How long can you stand comfortably? 1-3 mins with BUE support    How long can you walk comfortably? unable    Currently in Pain? No/denies                   Supine: -hamstring bent knee with leg on PT shoulder 60 seconds x1 trial each LE; deep pressure applied for tension/tone reduction each LE. ; reports LLE more affected than RLE - posterior calf stretch with progressive DF 60 second holds x1 trial each LE. -single knee to chest with progressive hold x 60 each LE -piriformis stretch, stabilization to limb provided with progressive lengthening x60 second holds   Side lying:  Hip extension AAROM 10x each LE      Seated: GTB row 20x cue for scapular retraction and posterior chain activation GTB UE ER 15x, cue for keeping elbows tucked to side  lateral cone reach and transfer 4 cones x 4 trials 5lb bar: -chest press 15x cue for body mechanics -straight arm raise 12x  -witches brew circle 10x clockwise, 10x counterclockwise   LAQ AAROM 10x each LE Hip flexion AAROM LLE, AROM RLE 10x each LE  Standing / walking knee immobilizers and lite gait belt donned in supine position, transition to sitting EOB with Min A . Sitting applied lite gait belt to machine and tightened to proper tautness with assistance x 2. STS from raised plinth table with standard walker. Upon standing cue for step back ; able to perform with  each LE able to self place leg. Upon standing take step forward pick up and push walker forward, then step again, repeat ;  45 ft total;  one PT assist with pushing lite gait one PT assist in walker placement. CGA x 2. Cues for foot placement and sequencing. Assisted back to wheelchair once he is unable to progress his LLE any further.      Pt educated throughout session about proper posture and technique with exercises. Improved exercise technique, movement at target joints, use of target muscles after min to mod verbal, visual, tactile cues   Patient is able to continue progressing ambulation duration ambulating an additional 5 ft this session. He remains highly motivated throughout PT session for  continuation of mobility and independence. Increased tolerance for interventions noted throughout session indicating full body strengthening. Pt will continue to benefit from skilled therapy in order to address remaining goals/deficits still present at this time                  PT Education - 12/11/20 0917     Education Details exercise technique, body mechanics    Person(s) Educated Patient    Methods Explanation;Demonstration;Tactile cues;Verbal cues    Comprehension Verbalized understanding;Returned demonstration;Verbal cues required;Tactile cues required;Need further instruction              PT Short Term Goals - 10/11/20 0941       PT SHORT TERM GOAL #1   Title Patient will be independent in home exercise program to improve strength/mobility for better functional independence with ADLs.    Baseline 06/12/20  pt performing HEP 1x/day. 3/30: performing 1x/day, interested in gym membership 4/27: progressing HEP 6/22: HEP compliant    Time 4    Period Weeks    Status Achieved    Target Date 09/13/20      PT SHORT TERM GOAL #2   Title Patient will be able to stand in parallel bars with support of BUE for 30 seconds with adequate weight bearing through BLE.    Baseline 01/03: less than 30s, 06/12/20  pt able to stand in parallel bars 3 mins with min to mod A cueing for posture and knee blocking.    Time 4    Period Weeks    Status Achieved    Target Date 07/10/20               PT Long Term Goals - 11/27/20 1034       PT LONG TERM GOAL #1   Title Patient will be able to stand in parallel bars with support of BUE for 30-60 seconds with CGA with adequate weight bearing through BLE.    Baseline 01/03: less than 30s, 06/12/20  pt able to stand in parallel bars 3 mins with min to mod A cueing for posture and knee blocking. 3/30: 60s with mod-max A +2 with blocking bil feet/knees and TC for quad mm activation 4/27: 1 min 15 seconds with min mod blocking of knees  6/22: 62 seconds with blocking of LE's only 8/8: able to perform with knee immobilizers    Time 12    Period Weeks    Status Achieved      PT LONG TERM GOAL #2   Title Patient will increase BLE gross strength half a grade as to improve functional strength for increased standing tolerance and increased ADL ability.    Baseline 06/12/20 requires max assist for most movements impacting left LE, right min to mod assist  depending on muscle group. 3/30: R hip flex, knee ext 3-/5, requires max A for LLE mvmt 4/27: R hip flex 3 knee extension 3- L trace 6/22: see note    Time 12    Period Weeks    Status Partially Met    Target Date 01/03/21      PT LONG TERM GOAL #3   Title Patient will reduce joint contracture in BLE ankle plantarflexion by being able to achieve ankle dorsiflexion to neutral for better functional mobility    Baseline 01/03: RLE patient actively moves it by 4 degrees, unable to actively move LLE into DF, 06/12/20  arom into right DF 6 degrees, left unable, trace contraction dorsal foot with DF attempts on left. 3/30: continues to be limited with achieving neutral, but demo improvements with PROM s/p MT on LLE (~3deg improvement), lacking ~15deg DF on RLE. 4/27: LLE -9 PROM RLE able to AROM to -2 to neutral  with inversion 6/22: able to obtain neutral with overpressure    Time 8    Period Weeks    Status Achieved      PT LONG TERM GOAL #4   Title Patient will be independent with gym program for total body strengthening.    Baseline 4/27: introducing dumbbells 6/22: doing some dumbbell exercises at home. 8/8: compliant with dumbbell exercises    Time 12    Period Weeks    Status Achieved      PT LONG TERM GOAL #5   Title Patient will increase FOTO score to equal to or greater than 60%    to demonstrate statistically significant improvement in mobility and quality of life.    Baseline 4/27: 42% 6/22: 34% 8/8: 56%    Time 12    Period Weeks    Status Partially Met    Target Date  01/03/21      PT LONG TERM GOAL #6   Title Patient will complete sit <> stand transfer with Mod I and LRAD to improve functional independence for household and community mobility    Baseline able to perform with stabilization to knees in // bars 8/8: needs assistance with knee immobilizers    Time 12    Period Weeks    Status On-going    Target Date 01/03/21      PT LONG TERM GOAL #7   Title Patient will ambulate at least 3 feet with min A using LRAD to improve mobility for household and community distances    Baseline -unable to ambulate 8/8: 10 ft with LiteGait, RW,and knee immobilizers    Time 12    Period Weeks    Status Partially Met    Target Date 01/03/21                   Plan - 12/11/20 1126     Clinical Impression Statement Patient is able to continue progressing ambulation duration ambulating an additional 5 ft this session. He remains highly motivated throughout PT session for continuation of mobility and independence. Increased tolerance for interventions noted throughout session indicating full body strengthening. Pt will continue to benefit from skilled therapy in order to address remaining goals/deficits still present at this time    Personal Factors and Comorbidities Comorbidity 3+;Time since onset of injury/illness/exacerbation    Comorbidities HIV, Urinary retention with bladder stretch injurt, CNS toxoplasmosis (CMS-HCC), Paraplegia, incomplete, diabetic    Examination-Activity Limitations Bathing    Examination-Participation Restrictions Driving    Stability/Clinical Decision Making Evolving/Moderate complexity  Rehab Potential Fair    PT Frequency 2x / week    PT Duration 12 weeks    PT Treatment/Interventions ADLs/Self Care Home Management;Electrical Stimulation;Moist Heat;Gait training;Stair training;Functional mobility training;Therapeutic activities;Therapeutic exercise;Balance training;Neuromuscular re-education;Wheelchair mobility training;Manual  techniques;Passive range of motion;Energy conservation;Orthotic Fit/Training;Biofeedback;Cryotherapy;Ultrasound;DME Instruction;Dry needling;Vestibular;Splinting;Patient/family education;Visual/perceptual remediation/compensation    PT Next Visit Plan Continue with progressive LE strengthening in supine/seated position and continue with Standing balance/endurance in Parallel bars.    PT Home Exercise Plan No updates this session;    Consulted and Agree with Plan of Care Patient             Patient will benefit from skilled therapeutic intervention in order to improve the following deficits and impairments:  Decreased range of motion, Decreased strength, Impaired flexibility, Difficulty walking, Decreased mobility, Decreased activity tolerance, Decreased endurance, Impaired sensation, Decreased balance, Impaired tone  Visit Diagnosis: Difficulty in walking, not elsewhere classified  Unsteadiness on feet  Muscle weakness (generalized)     Problem List There are no problems to display for this patient.   Janna Arch, PT, DPT  12/11/2020, 11:28 AM  [place] MAIN [facility] SERVICES [address] [place], [place], [address] Phone: [phone]   Fax:  [phone]  Name: Ryan Bush MRN: [id number] Date of Birth: [birth year]

## 2020-12-13 ENCOUNTER — Ambulatory Visit: Payer: 59

## 2020-12-13 ENCOUNTER — Other Ambulatory Visit: Payer: Self-pay

## 2020-12-13 DIAGNOSIS — R2681 Unsteadiness on feet: Secondary | ICD-10-CM

## 2020-12-13 DIAGNOSIS — M6281 Muscle weakness (generalized): Secondary | ICD-10-CM

## 2020-12-13 DIAGNOSIS — R262 Difficulty in walking, not elsewhere classified: Secondary | ICD-10-CM | POA: Diagnosis not present

## 2020-12-13 NOTE — Therapy (Signed)
[place] MAIN [facility] SERVICES [address] [place], [place], [address] Phone: [phone]   Fax:  [phone]  Physical Therapy Treatment  Patient Details  Name: Ryan Bush MRN: [id number] Date of Birth: [birth year] Referring Provider (PT): Clarise Cruz   Encounter Date: 12/13/2020   PT End of Session - 12/13/20 0951     Visit Number 55    Number of Visits 62    Date for PT Re-Evaluation 01/03/21    Authorization Type [employer]    Authorization Time Period 06/19/20-08/14/20; 5/10 PN 11/27/20    PT Start Time 0844    PT Stop Time 0928    PT Time Calculation (min) 44 min    Equipment Utilized During Treatment Gait belt    Activity Tolerance Patient tolerated treatment well;Patient limited by fatigue;No increased pain    Behavior During Therapy WFL for tasks assessed/performed             Past Medical History:  Diagnosis Date   HIV (human immunodeficiency virus infection) ([place])     History reviewed. No pertinent surgical history.  There were no vitals filed for this visit.   Subjective Assessment - 12/13/20 0949     Subjective Patient reports he has been compliant with his HEP. His wife and daughter are in the car waiting since she is too young to come into the hospital.    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 Patient had PT and OT in the hospital for 3 months. He was DC from PT due to his insurance running out and he needed a different kind of PT. He was discharged home and he had HHPT 2 x week for 1 month. He was dischaged from [place] and was supposed to get out patient PT but his insurance was cancelled. He has a new insurance now and has [place].    Limitations Standing;Walking    How long can you sit comfortably? unlimited    How long can you stand comfortably? 1-3 mins with BUE support    How long can  you walk comfortably? unable    Currently in Pain? No/denies                   Supine: -hamstring bent knee with leg on PT shoulder 60 seconds x1 trial each LE; deep pressure applied for tension/tone reduction each LE. ; reports LLE more affected than RLE - posterior calf stretch with progressive DF 60 second holds x1 trial each LE. -single knee to chest with progressive hold x 60 each LE -piriformis stretch, stabilization to limb provided with progressive lengthening x60 second holds       Seated: GTB row 20x cue for scapular retraction and posterior chain activation GTB UE ER 15x, cue for keeping elbows tucked to side  lateral cone reach and transfer 4 cones x 4 trials RTB adduction 10x each LE RTB abduction 10x each LE  LAQ AAROM 10x each LE Hip flexion AAROM LLE, AROM RLE 10x each LE   Standing / walking knee immobilizers and lite gait belt donned in supine position, transition to sitting EOB with Min A . Sitting applied lite gait belt to machine and tightened to proper tautness with assistance x 2. STS from raised plinth table with standard walker. Upon standing cue for step back ; able to perform with each LE able to self place leg.  Upon standing take step forward pick up and push walker forward, then step again, repeat ;  50.9 ft total;  one PT assist with pushing lite gait one PT assist in walker placement. CGA x 2. Cues for foot placement and sequencing. Assisted back to wheelchair once he is unable to progress his LLE any further.    Sit to stand from wheelchair with min A with bilateral knee immobilizers x 2 trials; stabilization to RW provided.    Pt educated throughout session about proper posture and technique with exercises. Improved exercise technique, movement at target joints, use of target muscles after min to mod verbal, visual, tactile cues  Discussed with patient that he has only 5 visits left of his 60 remaining. Will change from 2x/week to 1x/week to  conserve visits. Upon reaching limit from insurance will enroll patient in [facility] as he is a prime candidate. Patient is able to ambulate furthest duration to date with LiteGait, knee immobilizers, and walker. He additionally is being trained to stand from wheelchair to walker with immobilizers on to progress range of mobility. Pt will continue to benefit from skilled therapy in order to address remaining goals/deficits still present at this time                    PT Education - 12/13/20 0951     Education Details exercise technique, body mechanics    Person(s) Educated Patient    Methods Explanation;Demonstration;Tactile cues;Verbal cues    Comprehension Verbalized understanding;Returned demonstration;Verbal cues required;Tactile cues required              PT Short Term Goals - 10/11/20 0941       PT SHORT TERM GOAL #1   Title Patient will be independent in home exercise program to improve strength/mobility for better functional independence with ADLs.    Baseline 06/12/20  pt performing HEP 1x/day. 3/30: performing 1x/day, interested in gym membership 4/27: progressing HEP 6/22: HEP compliant    Time 4    Period Weeks    Status Achieved    Target Date 09/13/20      PT SHORT TERM GOAL #2   Title Patient will be able to stand in parallel bars with support of BUE for 30 seconds with adequate weight bearing through BLE.    Baseline 01/03: less than 30s, 06/12/20  pt able to stand in parallel bars 3 mins with min to mod A cueing for posture and knee blocking.    Time 4    Period Weeks    Status Achieved    Target Date 07/10/20               PT Long Term Goals - 11/27/20 1034       PT LONG TERM GOAL #1   Title Patient will be able to stand in parallel bars with support of BUE for 30-60 seconds with CGA with adequate weight bearing through BLE.    Baseline 01/03: less than 30s, 06/12/20  pt able to stand in parallel bars 3 mins with min to mod A cueing for  posture and knee blocking. 3/30: 60s with mod-max A +2 with blocking bil feet/knees and TC for quad mm activation 4/27: 1 min 15 seconds with min mod blocking of knees 6/22: 62 seconds with blocking of LE's only 8/8: able to perform with knee immobilizers    Time 12    Period Weeks    Status Achieved      PT LONG  TERM GOAL #2   Title Patient will increase BLE gross strength half a grade as to improve functional strength for increased standing tolerance and increased ADL ability.    Baseline 06/12/20 requires max assist for most movements impacting left LE, right min to mod assist depending on muscle group. 3/30: R hip flex, knee ext 3-/5, requires max A for LLE mvmt 4/27: R hip flex 3 knee extension 3- L trace 6/22: see note    Time 12    Period Weeks    Status Partially Met    Target Date 01/03/21      PT LONG TERM GOAL #3   Title Patient will reduce joint contracture in BLE ankle plantarflexion by being able to achieve ankle dorsiflexion to neutral for better functional mobility    Baseline 01/03: RLE patient actively moves it by 4 degrees, unable to actively move LLE into DF, 06/12/20  arom into right DF 6 degrees, left unable, trace contraction dorsal foot with DF attempts on left. 3/30: continues to be limited with achieving neutral, but demo improvements with PROM s/p MT on LLE (~3deg improvement), lacking ~15deg DF on RLE. 4/27: LLE -9 PROM RLE able to AROM to -2 to neutral  with inversion 6/22: able to obtain neutral with overpressure    Time 8    Period Weeks    Status Achieved      PT LONG TERM GOAL #4   Title Patient will be independent with gym program for total body strengthening.    Baseline 4/27: introducing dumbbells 6/22: doing some dumbbell exercises at home. 8/8: compliant with dumbbell exercises    Time 12    Period Weeks    Status Achieved      PT LONG TERM GOAL #5   Title Patient will increase FOTO score to equal to or greater than 60%    to demonstrate statistically  significant improvement in mobility and quality of life.    Baseline 4/27: 42% 6/22: 34% 8/8: 56%    Time 12    Period Weeks    Status Partially Met    Target Date 01/03/21      PT LONG TERM GOAL #6   Title Patient will complete sit <> stand transfer with Mod I and LRAD to improve functional independence for household and community mobility    Baseline able to perform with stabilization to knees in // bars 8/8: needs assistance with knee immobilizers    Time 12    Period Weeks    Status On-going    Target Date 01/03/21      PT LONG TERM GOAL #7   Title Patient will ambulate at least 3 feet with min A using LRAD to improve mobility for household and community distances    Baseline -unable to ambulate 8/8: 10 ft with LiteGait, RW,and knee immobilizers    Time 12    Period Weeks    Status Partially Met    Target Date 01/03/21                   Plan - 12/13/20 0953     Clinical Impression Statement Discussed with patient that he has only 5 visits left of his 37 remaining. Will change from 2x/week to 1x/week to conserve visits. Upon reaching limit from insurance will enroll patient in [facility] as he is a prime candidate. Patient is able to ambulate furthest duration to date with LiteGait, knee immobilizers, and walker. He additionally is being trained to stand  from wheelchair to walker with immobilizers on to progress range of mobility. Pt will continue to benefit from skilled therapy in order to address remaining goals/deficits still present at this time    Personal Factors and Comorbidities Comorbidity 3+;Time since onset of injury/illness/exacerbation    Comorbidities HIV, Urinary retention with bladder stretch injurt, CNS toxoplasmosis (CMS-HCC), Paraplegia, incomplete, diabetic    Examination-Activity Limitations Bathing    Examination-Participation Restrictions Driving    Stability/Clinical Decision Making Evolving/Moderate complexity    Rehab Potential Fair    PT  Frequency 2x / week    PT Duration 12 weeks    PT Treatment/Interventions ADLs/Self Care Home Management;Electrical Stimulation;Moist Heat;Gait training;Stair training;Functional mobility training;Therapeutic activities;Therapeutic exercise;Balance training;Neuromuscular re-education;Wheelchair mobility training;Manual techniques;Passive range of motion;Energy conservation;Orthotic Fit/Training;Biofeedback;Cryotherapy;Ultrasound;DME Instruction;Dry needling;Vestibular;Splinting;Patient/family education;Visual/perceptual remediation/compensation    PT Next Visit Plan Continue with progressive LE strengthening in supine/seated position and continue with Standing balance/endurance in Parallel bars.    PT Home Exercise Plan No updates this session;    Consulted and Agree with Plan of Care Patient             Patient will benefit from skilled therapeutic intervention in order to improve the following deficits and impairments:  Decreased range of motion, Decreased strength, Impaired flexibility, Difficulty walking, Decreased mobility, Decreased activity tolerance, Decreased endurance, Impaired sensation, Decreased balance, Impaired tone  Visit Diagnosis: Difficulty in walking, not elsewhere classified  Unsteadiness on feet  Muscle weakness (generalized)     Problem List There are no problems to display for this patient.   Janna Arch, PT, DPT  12/13/2020, 9:54 AM  [place] MAIN [facility] SERVICES [address] [place], [place], [address] Phone: [phone]   Fax:  [phone]  Name: Ryan Bush MRN: [id number] Date of Birth: 1975-07-01

## 2020-12-18 ENCOUNTER — Ambulatory Visit: Payer: 59

## 2020-12-20 ENCOUNTER — Other Ambulatory Visit: Payer: Self-pay

## 2020-12-20 ENCOUNTER — Ambulatory Visit: Payer: 59

## 2020-12-20 DIAGNOSIS — R262 Difficulty in walking, not elsewhere classified: Secondary | ICD-10-CM | POA: Diagnosis not present

## 2020-12-20 DIAGNOSIS — R2681 Unsteadiness on feet: Secondary | ICD-10-CM | POA: Diagnosis not present

## 2020-12-20 DIAGNOSIS — M6281 Muscle weakness (generalized): Secondary | ICD-10-CM

## 2020-12-20 NOTE — Therapy (Signed)
[place] MAIN [facility] SERVICES [address] [place], [place], [address] Phone: [phone]   Fax:  [phone]  Physical Therapy Treatment  Patient Details  Name: Ryan Bush MRN: [id number] Date of Birth: [birth year] Referring Provider (PT): Clarise Cruz   Encounter Date: 12/20/2020   PT End of Session - 12/20/20 0943     Visit Number [id number]    Number of Visits 62    Date for PT Re-Evaluation 01/03/21    Authorization Type [employer]    Authorization Time Period 06/19/20-08/14/20; 6/10 PN 11/27/20    PT Start Time 0845    PT Stop Time 0930    PT Time Calculation (min) 45 min    Equipment Utilized During Treatment Gait belt    Activity Tolerance Patient tolerated treatment well;Patient limited by fatigue;No increased pain    Behavior During Therapy WFL for tasks assessed/performed             Past Medical History:  Diagnosis Date   HIV (human immunodeficiency virus infection) ([place])     History reviewed. No pertinent surgical history.  There were no vitals filed for this visit.   Subjective Assessment - 12/20/20 0942     Subjective Patient reports he has been compliant with his HEP, has had no falls or LOB since last session.    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 Patient had PT and OT in the hospital for 3 months. He was DC from PT due to his insurance running out and he needed a different kind of PT. He was discharged home and he had HHPT 2 x week for 1 month. He was dischaged from [facility] and was supposed to get out patient PT but his insurance was cancelled. He has a new insurance now and has [place].    Limitations Standing;Walking    How long can you sit comfortably? unlimited    How long can you stand comfortably? 1-3 mins with BUE support    How long can you walk comfortably? unable    Currently in Pain?  No/denies                   Supine: -hamstring bent knee with leg on PT shoulder 60 seconds x1 trial each LE; deep pressure applied for tension/tone reduction each LE. ; reports LLE more affected than RLE - posterior calf stretch with progressive DF 60 second holds x1 trial each LE. -single knee to chest with progressive hold x 60 each LE -piriformis stretch, stabilization to limb provided with progressive lengthening x60 second holds -LE rotation x 60 seconds      Seated: GTB row 20x cue for scapular retraction and posterior chain activation GTB UE ER 15x, cue for keeping elbows tucked to side  lateral cone reach and transfer 4 cones x 4 trials RTB adduction 10x each LE RTB abduction 10x each LE  LAQ AAROM 10x each LE Hip flexion AAROM LLE, AROM RLE 10x each LE   Standing / walking knee immobilizers and lite gait belt donned in supine position, transition to sitting EOB with Min A . Sitting applied lite gait belt to machine and tightened to proper tautness with assistance x 2. STS from raised plinth table with standard walker. Upon standing cue for step back ; able to perform with each LE able to self place leg. Upon standing take step forward pick  up and push walker forward, then step again, repeat ;  67 ft total;  one PT assist with pushing lite gait one PT assist in walker placement. CGA x 2. Cues for foot placement and sequencing. Assisted back to wheelchair once he is unable to progress his LLE any further.    Sit to stand from wheelchair with min/mod A with bilateral knee immobilizers x 2 trials; stabilization to RW provided.    Pt educated throughout session about proper posture and technique with exercises. Improved exercise technique, movement at target joints, use of target muscles after min to mod verbal, visual, tactile cues   Patient ambulated longest duration to date this session. He remains highly motivated despite recent news of limited visits with insurance.  Patient took his knee immobilizers home this session to practice donning/doffing with wife between now and next session. His RLE continues to progress with strength and mobility however L continues to be impaired at this time. Pt will continue to benefit from skilled therapy in order to address remaining goals/deficits still present at this time                  PT Education - 12/20/20 0943     Education Details ambulation, body mechanics, use of knee immobilizers    Person(s) Educated Patient    Methods Explanation;Demonstration;Tactile cues;Verbal cues    Comprehension Verbalized understanding;Returned demonstration;Verbal cues required;Tactile cues required              PT Short Term Goals - 10/11/20 0941       PT SHORT TERM GOAL #1   Title Patient will be independent in home exercise program to improve strength/mobility for better functional independence with ADLs.    Baseline 06/12/20  pt performing HEP 1x/day. 3/30: performing 1x/day, interested in gym membership 4/27: progressing HEP 6/22: HEP compliant    Time 4    Period Weeks    Status Achieved    Target Date 09/13/20      PT SHORT TERM GOAL #2   Title Patient will be able to stand in parallel bars with support of BUE for 30 seconds with adequate weight bearing through BLE.    Baseline 01/03: less than 30s, 06/12/20  pt able to stand in parallel bars 3 mins with min to mod A cueing for posture and knee blocking.    Time 4    Period Weeks    Status Achieved    Target Date 07/10/20               PT Long Term Goals - 11/27/20 1034       PT LONG TERM GOAL #1   Title Patient will be able to stand in parallel bars with support of BUE for 30-60 seconds with CGA with adequate weight bearing through BLE.    Baseline 01/03: less than 30s, 06/12/20  pt able to stand in parallel bars 3 mins with min to mod A cueing for posture and knee blocking. 3/30: 60s with mod-max A +2 with blocking bil feet/knees and TC  for quad mm activation 4/27: 1 min 15 seconds with min mod blocking of knees 6/22: 62 seconds with blocking of LE's only 8/8: able to perform with knee immobilizers    Time 12    Period Weeks    Status Achieved      PT LONG TERM GOAL #2   Title Patient will increase BLE gross strength half a grade as to improve functional strength for increased  standing tolerance and increased ADL ability.    Baseline 06/12/20 requires max assist for most movements impacting left LE, right min to mod assist depending on muscle group. 3/30: R hip flex, knee ext 3-/5, requires max A for LLE mvmt 4/27: R hip flex 3 knee extension 3- L trace 6/22: see note    Time 12    Period Weeks    Status Partially Met    Target Date 01/03/21      PT LONG TERM GOAL #3   Title Patient will reduce joint contracture in BLE ankle plantarflexion by being able to achieve ankle dorsiflexion to neutral for better functional mobility    Baseline 01/03: RLE patient actively moves it by 4 degrees, unable to actively move LLE into DF, 06/12/20  arom into right DF 6 degrees, left unable, trace contraction dorsal foot with DF attempts on left. 3/30: continues to be limited with achieving neutral, but demo improvements with PROM s/p MT on LLE (~3deg improvement), lacking ~15deg DF on RLE. 4/27: LLE -9 PROM RLE able to AROM to -2 to neutral  with inversion 6/22: able to obtain neutral with overpressure    Time 8    Period Weeks    Status Achieved      PT LONG TERM GOAL #4   Title Patient will be independent with gym program for total body strengthening.    Baseline 4/27: introducing dumbbells 6/22: doing some dumbbell exercises at home. 8/8: compliant with dumbbell exercises    Time 12    Period Weeks    Status Achieved      PT LONG TERM GOAL #5   Title Patient will increase FOTO score to equal to or greater than 60%    to demonstrate statistically significant improvement in mobility and quality of life.    Baseline 4/27: 42% 6/22: 34%  8/8: 56%    Time 12    Period Weeks    Status Partially Met    Target Date 01/03/21      PT LONG TERM GOAL #6   Title Patient will complete sit <> stand transfer with Mod I and LRAD to improve functional independence for household and community mobility    Baseline able to perform with stabilization to knees in // bars 8/8: needs assistance with knee immobilizers    Time 12    Period Weeks    Status On-going    Target Date 01/03/21      PT LONG TERM GOAL #7   Title Patient will ambulate at least 3 feet with min A using LRAD to improve mobility for household and community distances    Baseline -unable to ambulate 8/8: 10 ft with LiteGait, RW,and knee immobilizers    Time 12    Period Weeks    Status Partially Met    Target Date 01/03/21                   Plan - 12/20/20 0946     Clinical Impression Statement Patient ambulated longest duration to date this session. He remains highly motivated despite recent news of limited visits with insurance. Patient took his knee immobilizers home this session to practice donning/doffing with wife between now and next session. His RLE continues to progress with strength and mobility however L continues to be impaired at this time. Pt will continue to benefit from skilled therapy in order to address remaining goals/deficits still present at this time    Personal Factors and Comorbidities Comorbidity 3+;Time  since onset of injury/illness/exacerbation    Comorbidities HIV, Urinary retention with bladder stretch injurt, CNS toxoplasmosis (CMS-HCC), Paraplegia, incomplete, diabetic    Examination-Activity Limitations Bathing    Examination-Participation Restrictions Driving    Stability/Clinical Decision Making Evolving/Moderate complexity    Rehab Potential Fair    PT Frequency 2x / week    PT Duration 12 weeks    PT Treatment/Interventions ADLs/Self Care Home Management;Electrical Stimulation;Moist Heat;Gait training;Stair  training;Functional mobility training;Therapeutic activities;Therapeutic exercise;Balance training;Neuromuscular re-education;Wheelchair mobility training;Manual techniques;Passive range of motion;Energy conservation;Orthotic Fit/Training;Biofeedback;Cryotherapy;Ultrasound;DME Instruction;Dry needling;Vestibular;Splinting;Patient/family education;Visual/perceptual remediation/compensation    PT Next Visit Plan Continue with progressive LE strengthening in supine/seated position and continue with Standing balance/endurance in Parallel bars.    PT Home Exercise Plan No updates this session;    Consulted and Agree with Plan of Care Patient             Patient will benefit from skilled therapeutic intervention in order to improve the following deficits and impairments:  Decreased range of motion, Decreased strength, Impaired flexibility, Difficulty walking, Decreased mobility, Decreased activity tolerance, Decreased endurance, Impaired sensation, Decreased balance, Impaired tone  Visit Diagnosis: Difficulty in walking, not elsewhere classified  Unsteadiness on feet  Muscle weakness (generalized)     Problem List There are no problems to display for this patient.   Janna Arch, PT, DPT  12/20/2020, 9:47 AM  [place] MAIN [facility] SERVICES [address] [place], [place], [address] Phone: [phone]   Fax:  [phone]  Name: Ryan Bush MRN: [id number] Date of Birth: 07-19-1975

## 2020-12-26 DIAGNOSIS — R339 Retention of urine, unspecified: Secondary | ICD-10-CM | POA: Diagnosis not present

## 2020-12-27 ENCOUNTER — Other Ambulatory Visit: Payer: Self-pay

## 2020-12-27 ENCOUNTER — Ambulatory Visit: Payer: 59 | Attending: Physical Medicine and Rehabilitation

## 2020-12-27 DIAGNOSIS — R2681 Unsteadiness on feet: Secondary | ICD-10-CM | POA: Diagnosis not present

## 2020-12-27 DIAGNOSIS — G8222 Paraplegia, incomplete: Secondary | ICD-10-CM | POA: Diagnosis not present

## 2020-12-27 DIAGNOSIS — M6281 Muscle weakness (generalized): Secondary | ICD-10-CM | POA: Insufficient documentation

## 2020-12-27 DIAGNOSIS — R262 Difficulty in walking, not elsewhere classified: Secondary | ICD-10-CM | POA: Diagnosis not present

## 2020-12-27 NOTE — Therapy (Signed)
[place] MAIN [facility] SERVICES [address] [place], [place], [address] Phone: [phone]   Fax:  [phone]  Physical Therapy Treatment  Patient Details  Name: Ryan Bush MRN: [id number] Date of Birth: December 30, 1975 Referring Provider (PT): Clarise Cruz   Encounter Date: 12/27/2020   PT End of Session - 12/27/20 0918     Visit Number 83    Number of Visits 62    Date for PT Re-Evaluation 01/03/21    Authorization Type [employer]    Authorization Time Period 06/19/20-08/14/20; 7/10 PN 11/27/20    PT Start Time 0845    PT Stop Time 0926    PT Time Calculation (min) 41 min    Equipment Utilized During Treatment Gait belt    Activity Tolerance Patient tolerated treatment well;Patient limited by fatigue;No increased pain    Behavior During Therapy WFL for tasks assessed/performed             Past Medical History:  Diagnosis Date   HIV (human immunodeficiency virus infection) ([place])     History reviewed. No pertinent surgical history.  There were no vitals filed for this visit.   Subjective Assessment - 12/27/20 1250     Subjective Patient reports no falls or loss of balance since last session.  Was not able to utilize braces as his wife was too weak to take them and daughter has been ill lately.  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 Patient had PT and OT in the hospital for 3 months. He was DC from PT due to his insurance running out and he needed a different kind of PT. He was discharged home and he had HHPT 2 x week for 1 month. He was dischaged from [place] and was supposed to get out patient PT but his insurance was cancelled. He has a new insurance now and has [place].    Limitations Standing;Walking    How  long can you sit comfortably? unlimited    How long can you stand comfortably? 1-3 mins with BUE support    How long can you walk comfortably? unable    Currently in Pain? No/denies               Supine: -hamstring bent knee with leg on PT shoulder 60 seconds x1 trial each LE; deep pressure applied for tension/tone reduction each LE. ; reports LLE more affected than RLE - posterior calf stretch with progressive DF 60 second holds x1 trial each LE. -single knee to chest with progressive hold x 60 each LE -piriformis stretch, stabilization to limb provided with progressive lengthening x60 second holds -LE rotation x 60 seconds  -LE bridge 10x with stabilization to legs provided     Seated: Weighted ball -arc/rainbow 10x each direction -chest press 15x -straight arm raise 15x  RTB adduction 10x each LE RTB abduction 10x each LE  LAQ AAROM 10x each LE Hip flexion AAROM LLE, AROM RLE 10x each LE   Standing / walking knee immobilizers and lite gait belt donned in supine position, transition to sitting EOB with Min A . Sitting applied lite gait belt to machine and tightened to proper tautness with assistance x 2. STS from raised plinth table with standard walker. Upon standing cue for step back ;  able to perform with each LE able to self place leg. Upon standing take step forward pick up and push walker forward, then step again, repeat ;  80.7 ft total;  one PT assist with pushing lite gait one PT assist in walker placement. CGA x 2. Cues for foot placement and sequencing. Assisted back to wheelchair once he is unable to progress his LLE any further.    Sit to stand from wheelchair with min/mod A with bilateral knee immobilizers x 2 trials; stabilization to RW provided.    Pt educated throughout session about proper posture and technique with exercises. Improved exercise technique, movement at target joints, use of target muscles after min to mod verbal, visual, tactile cues  Note:  Portions of this document were prepared using Dragon voice recognition software and although reviewed may contain unintentional dictation errors in syntax, grammar, or spelling.   Patient is able to ambulate longest duration today performing 80.7 ft. Patient is highly motivated and aware that he has 3 remaining visits approved by insurance.  Patient will be attending [facility] clinic upon completioPt will continue to benefit from skilled therapy in order to address remaining goals/deficits still present at this timen of physical therapy at this clinic.                   Upper Extremity Functional Index Score :   /80   PT Education - 12/28/20 0731     Education Details [facility]    Person(s) Educated Patient    Methods Explanation    Comprehension Verbalized understanding              PT Short Term Goals - 10/11/20 0941       PT SHORT TERM GOAL #1   Title Patient will be independent in home exercise program to improve strength/mobility for better functional independence with ADLs.    Baseline 06/12/20  pt performing HEP 1x/day. 3/30: performing 1x/day, interested in gym membership 4/27: progressing HEP 6/22: HEP compliant    Time 4    Period Weeks    Status Achieved    Target Date 09/13/20      PT SHORT TERM GOAL #2   Title Patient will be able to stand in parallel bars with support of BUE for 30 seconds with adequate weight bearing through BLE.    Baseline 01/03: less than 30s, 06/12/20  pt able to stand in parallel bars 3 mins with min to mod A cueing for posture and knee blocking.    Time 4    Period Weeks    Status Achieved    Target Date 07/10/20               PT Long Term Goals - 11/27/20 1034       PT LONG TERM GOAL #1   Title Patient will be able to stand in parallel bars with support of BUE for 30-60 seconds with CGA with adequate weight bearing through BLE.    Baseline 01/03: less than 30s, 06/12/20  pt able to stand in parallel bars 3  mins with min to mod A cueing for posture and knee blocking. 3/30: 60s with mod-max A +2 with blocking bil feet/knees and TC for quad mm activation 4/27: 1 min 15 seconds with min mod blocking of knees 6/22: 62 seconds with blocking of LE's only 8/8: able to perform with knee immobilizers    Time 12    Period Weeks    Status Achieved  PT LONG TERM GOAL #2   Title Patient will increase BLE gross strength half a grade as to improve functional strength for increased standing tolerance and increased ADL ability.    Baseline 06/12/20 requires max assist for most movements impacting left LE, right min to mod assist depending on muscle group. 3/30: R hip flex, knee ext 3-/5, requires max A for LLE mvmt 4/27: R hip flex 3 knee extension 3- L trace 6/22: see note    Time 12    Period Weeks    Status Partially Met    Target Date 01/03/21      PT LONG TERM GOAL #3   Title Patient will reduce joint contracture in BLE ankle plantarflexion by being able to achieve ankle dorsiflexion to neutral for better functional mobility    Baseline 01/03: RLE patient actively moves it by 4 degrees, unable to actively move LLE into DF, 06/12/20  arom into right DF 6 degrees, left unable, trace contraction dorsal foot with DF attempts on left. 3/30: continues to be limited with achieving neutral, but demo improvements with PROM s/p MT on LLE (~3deg improvement), lacking ~15deg DF on RLE. 4/27: LLE -9 PROM RLE able to AROM to -2 to neutral  with inversion 6/22: able to obtain neutral with overpressure    Time 8    Period Weeks    Status Achieved      PT LONG TERM GOAL #4   Title Patient will be independent with gym program for total body strengthening.    Baseline 4/27: introducing dumbbells 6/22: doing some dumbbell exercises at home. 8/8: compliant with dumbbell exercises    Time 12    Period Weeks    Status Achieved      PT LONG TERM GOAL #5   Title Patient will increase FOTO score to equal to or greater than 60%     to demonstrate statistically significant improvement in mobility and quality of life.    Baseline 4/27: 42% 6/22: 34% 8/8: 56%    Time 12    Period Weeks    Status Partially Met    Target Date 01/03/21      PT LONG TERM GOAL #6   Title Patient will complete sit <> stand transfer with Mod I and LRAD to improve functional independence for household and community mobility    Baseline able to perform with stabilization to knees in // bars 8/8: needs assistance with knee immobilizers    Time 12    Period Weeks    Status On-going    Target Date 01/03/21      PT LONG TERM GOAL #7   Title Patient will ambulate at least 3 feet with min A using LRAD to improve mobility for household and community distances    Baseline -unable to ambulate 8/8: 10 ft with LiteGait, RW,and knee immobilizers    Time 12    Period Weeks    Status Partially Met    Target Date 01/03/21                   Plan - 12/28/20 0733     Clinical Impression Statement Patient is able to ambulate longest duration today performing 80.7 ft. Patient is highly motivated and aware that he has 3 remaining visits approved by insurance.  Patient will be attending [facility] clinic upon completioPt will continue to benefit from skilled therapy in order to address remaining goals/deficits still present at this timen of physical therapy at this clinic.  Personal Factors and Comorbidities Comorbidity 3+;Time since onset of injury/illness/exacerbation    Comorbidities HIV, Urinary retention with bladder stretch injurt, CNS toxoplasmosis (CMS-HCC), Paraplegia, incomplete, diabetic    Examination-Activity Limitations Bathing    Examination-Participation Restrictions Driving    Stability/Clinical Decision Making Evolving/Moderate complexity    Rehab Potential Fair    PT Frequency 2x / week    PT Duration 12 weeks    PT Treatment/Interventions ADLs/Self Care Home Management;Electrical Stimulation;Moist Heat;Gait training;Stair  training;Functional mobility training;Therapeutic activities;Therapeutic exercise;Balance training;Neuromuscular re-education;Wheelchair mobility training;Manual techniques;Passive range of motion;Energy conservation;Orthotic Fit/Training;Biofeedback;Cryotherapy;Ultrasound;DME Instruction;Dry needling;Vestibular;Splinting;Patient/family education;Visual/perceptual remediation/compensation    PT Next Visit Plan Continue with progressive LE strengthening in supine/seated position and continue with Standing balance/endurance in Parallel bars.    PT Home Exercise Plan No updates this session;    Consulted and Agree with Plan of Care Patient             Patient will benefit from skilled therapeutic intervention in order to improve the following deficits and impairments:  Decreased range of motion, Decreased strength, Impaired flexibility, Difficulty walking, Decreased mobility, Decreased activity tolerance, Decreased endurance, Impaired sensation, Decreased balance, Impaired tone  Visit Diagnosis: Difficulty in walking, not elsewhere classified  Unsteadiness on feet  Muscle weakness (generalized)     Problem List There are no problems to display for this patient.   Janna Arch, PT, DPT  12/28/2020, 7:34 AM  [place] MAIN [facility] SERVICES [address] [place], [place], [address] Phone: [phone]   Fax:  [phone]  Name: Ryan Bush MRN: [id number] Date of Birth: [birth year]

## 2021-01-01 ENCOUNTER — Ambulatory Visit: Payer: 59

## 2021-01-03 ENCOUNTER — Other Ambulatory Visit: Payer: Self-pay

## 2021-01-03 ENCOUNTER — Ambulatory Visit: Payer: 59 | Admitting: Physical Therapy

## 2021-01-03 DIAGNOSIS — R2681 Unsteadiness on feet: Secondary | ICD-10-CM

## 2021-01-03 DIAGNOSIS — G8222 Paraplegia, incomplete: Secondary | ICD-10-CM | POA: Diagnosis not present

## 2021-01-03 DIAGNOSIS — M6281 Muscle weakness (generalized): Secondary | ICD-10-CM

## 2021-01-03 DIAGNOSIS — R262 Difficulty in walking, not elsewhere classified: Secondary | ICD-10-CM | POA: Diagnosis not present

## 2021-01-03 NOTE — Therapy (Signed)
[place] MAIN [facility] SERVICES [address] [place], [place], [address] Phone: [phone]   Fax:  [phone]  Physical Therapy Treatment  Patient Details  Name: Ryan Bush MRN: [id number] Date of Birth: [birth year] Referring Provider (PT): Clarise Cruz   Encounter Date: 01/03/2021   PT End of Session - 01/03/21 1050     Visit Number 61    Number of Visits 62    Date for PT Re-Evaluation 01/03/21    Authorization Type [employer]    Authorization Time Period 06/19/20-08/14/20; 7/10 PN 11/27/20    PT Start Time 0846    PT Stop Time 0934    PT Time Calculation (min) 48 min    Equipment Utilized During Treatment Gait belt    Activity Tolerance Patient tolerated treatment well;Patient limited by fatigue;No increased pain    Behavior During Therapy WFL for tasks assessed/performed             Past Medical History:  Diagnosis Date   HIV (human immunodeficiency virus infection) ([place])     History reviewed. No pertinent surgical history.  There were no vitals filed for this visit.   Subjective Assessment - 01/03/21 1049     Subjective Patient reports no falls or loss of balance since last session. No questions or concerns.    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 Patient had PT and OT in the hospital for 3 months. He was DC from PT due to his insurance running out and he needed a different kind of PT. He was discharged home and he had HHPT 2 x week for 1 month. He was dischaged from [place] and was supposed to get out patient PT but his insurance was cancelled. He has a new insurance now and has [place].    Limitations Standing;Walking    How long can you sit comfortably? unlimited    How long can you stand comfortably? 1-3 mins with BUE support    How long can you walk comfortably? unable    Currently in Pain?  No/denies             Supine: -hamstring bent knee with leg on PT shoulder 60 seconds x1 trial each LE; deep pressure applied for tension/tone reduction each LE. ; reports LLE more affected than RLE - posterior calf stretch with progressive DF 60 second holds x1 trial each LE. -single knee to chest with progressive hold x 60 each LE -piriformis stretch, stabilization to limb provided with progressive lengthening x60 second holds -LE rotation x 60 seconds  -LE bridge 10x with stabilization to legs provided     Seated: Weighted ball -arc/rainbow 10x each direction -chest press 15x -straight arm raise 15x   RTB adduction 15x each LE RTB abduction 15x each LE  LAQ AAROM 15x each LE Hip flexion AAROM LLE, AROM RLE 15x each LE   Standing / walking knee immobilizers and lite gait belt donned in supine position, transition to sitting EOB with Min A . Sitting applied lite gait belt to machine and tightened to proper tautness with assistance x 2. STS from raised plinth table with rolling walker. Upon standing cue for step back ; able to perform with each LE able to self place leg. Upon standing take step forward pick up and push walker forward, then step again, repeat ;  15 ft total;  one PT assist with  pushing lite gait one PT assist in walker and BLE placement. CGA x 2. Cues for sequencing. Assisted back to wheelchair after pt reported feeling "bad" during ambulation.    Pt educated throughout session about proper posture and technique with exercises. Improved exercise technique, movement at target joints, use of target muscles after min to mod verbal, visual, tactile cues  Patient pleasant with excellent motivation throughout today's session. He demo increased PF tone bilaterally during stretching and ambulation. Ambulation distance decreased today compared to previous sessions due to patient stating he felt "bad" as he was attached to the [place]. PT and colleague (another PT) checked  for correct harness placement and connection to LiteGait - all in proper placement. Patient is aware that he has 2 remaining visits approved by insurance.  Patient will be attending [facility] clinic upon completion. Pt will continue to benefit from skilled therapy in order to address remaining goals/deficits still present at this timen of physical therapy at this clinic.        PT Short Term Goals - 10/11/20 0941       PT SHORT TERM GOAL #1   Title Patient will be independent in home exercise program to improve strength/mobility for better functional independence with ADLs.    Baseline 06/12/20  pt performing HEP 1x/day. 3/30: performing 1x/day, interested in gym membership 4/27: progressing HEP 6/22: HEP compliant    Time 4    Period Weeks    Status Achieved    Target Date 09/13/20      PT SHORT TERM GOAL #2   Title Patient will be able to stand in parallel bars with support of BUE for 30 seconds with adequate weight bearing through BLE.    Baseline 01/03: less than 30s, 06/12/20  pt able to stand in parallel bars 3 mins with min to mod A cueing for posture and knee blocking.    Time 4    Period Weeks    Status Achieved    Target Date 07/10/20               PT Long Term Goals - 11/27/20 1034       PT LONG TERM GOAL #1   Title Patient will be able to stand in parallel bars with support of BUE for 30-60 seconds with CGA with adequate weight bearing through BLE.    Baseline 01/03: less than 30s, 06/12/20  pt able to stand in parallel bars 3 mins with min to mod A cueing for posture and knee blocking. 3/30: 60s with mod-max A +2 with blocking bil feet/knees and TC for quad mm activation 4/27: 1 min 15 seconds with min mod blocking of knees 6/22: 62 seconds with blocking of LE's only 8/8: able to perform with knee immobilizers    Time 12    Period Weeks    Status Achieved      PT LONG TERM GOAL #2   Title Patient will increase BLE gross strength half a grade as to improve functional  strength for increased standing tolerance and increased ADL ability.    Baseline 06/12/20 requires max assist for most movements impacting left LE, right min to mod assist depending on muscle group. 3/30: R hip flex, knee ext 3-/5, requires max A for LLE mvmt 4/27: R hip flex 3 knee extension 3- L trace 6/22: see note    Time 12    Period Weeks    Status Partially Met    Target Date 01/03/21  PT LONG TERM GOAL #3   Title Patient will reduce joint contracture in BLE ankle plantarflexion by being able to achieve ankle dorsiflexion to neutral for better functional mobility    Baseline 01/03: RLE patient actively moves it by 4 degrees, unable to actively move LLE into DF, 06/12/20  arom into right DF 6 degrees, left unable, trace contraction dorsal foot with DF attempts on left. 3/30: continues to be limited with achieving neutral, but demo improvements with PROM s/p MT on LLE (~3deg improvement), lacking ~15deg DF on RLE. 4/27: LLE -9 PROM RLE able to AROM to -2 to neutral  with inversion 6/22: able to obtain neutral with overpressure    Time 8    Period Weeks    Status Achieved      PT LONG TERM GOAL #4   Title Patient will be independent with gym program for total body strengthening.    Baseline 4/27: introducing dumbbells 6/22: doing some dumbbell exercises at home. 8/8: compliant with dumbbell exercises    Time 12    Period Weeks    Status Achieved      PT LONG TERM GOAL #5   Title Patient will increase FOTO score to equal to or greater than 60%    to demonstrate statistically significant improvement in mobility and quality of life.    Baseline 4/27: 42% 6/22: 34% 8/8: 56%    Time 12    Period Weeks    Status Partially Met    Target Date 01/03/21      PT LONG TERM GOAL #6   Title Patient will complete sit <> stand transfer with Mod I and LRAD to improve functional independence for household and community mobility    Baseline able to perform with stabilization to knees in // bars  8/8: needs assistance with knee immobilizers    Time 12    Period Weeks    Status On-going    Target Date 01/03/21      PT LONG TERM GOAL #7   Title Patient will ambulate at least 3 feet with min A using LRAD to improve mobility for household and community distances    Baseline -unable to ambulate 8/8: 10 ft with LiteGait, RW,and knee immobilizers    Time 12    Period Weeks    Status Partially Met    Target Date 01/03/21                   Plan - 01/03/21 1052     Clinical Impression Statement Patient pleasant with excellent motivation throughout today's session. He demo increased PF tone bilaterally during stretching and ambulation. Ambulation distance decreased today compared to previous sessions due to patient stating he felt "bad" as he was attached to the [place]. PT and colleague (another PT) checked for correct harness placement and connection to LiteGait - all in proper placement. Patient is aware that he has 2 remaining visits approved by insurance.  Patient will be attending [facility] clinic upon completion. Pt will continue to benefit from skilled therapy in order to address remaining goals/deficits still present at this timen of physical therapy at this clinic.    Personal Factors and Comorbidities Comorbidity 3+;Time since onset of injury/illness/exacerbation    Comorbidities HIV, Urinary retention with bladder stretch injurt, CNS toxoplasmosis (CMS-HCC), Paraplegia, incomplete, diabetic    Examination-Activity Limitations Bathing    Examination-Participation Restrictions Driving    Stability/Clinical Decision Making Evolving/Moderate complexity    Rehab Potential Fair    PT Frequency  2x / week    PT Duration 12 weeks    PT Treatment/Interventions ADLs/Self Care Home Management;Electrical Stimulation;Moist Heat;Gait training;Stair training;Functional mobility training;Therapeutic activities;Therapeutic exercise;Balance training;Neuromuscular re-education;Wheelchair  mobility training;Manual techniques;Passive range of motion;Energy conservation;Orthotic Fit/Training;Biofeedback;Cryotherapy;Ultrasound;DME Instruction;Dry needling;Vestibular;Splinting;Patient/family education;Visual/perceptual remediation/compensation    PT Next Visit Plan Continue with progressive LE strengthening in supine/seated position and continue with Standing balance/endurance in Parallel bars.    PT Home Exercise Plan No updates this session;    Consulted and Agree with Plan of Care Patient             Patient will benefit from skilled therapeutic intervention in order to improve the following deficits and impairments:  Decreased range of motion, Decreased strength, Impaired flexibility, Difficulty walking, Decreased mobility, Decreased activity tolerance, Decreased endurance, Impaired sensation, Decreased balance, Impaired tone  Visit Diagnosis: Difficulty in walking, not elsewhere classified  Unsteadiness on feet  Muscle weakness (generalized)  Paraplegia, incomplete (HCC)     Problem List There are no problems to display for this patient.   Patrina Levering PT, DPT  Ramonita Lab, PT 01/03/2021, 10:54 AM  [place] MAIN [facility] SERVICES [address] [place], [place], [address] Phone: [phone]   Fax:  [phone]  Name: Kathan Kirker MRN: [id number] Date of Birth: 1975/09/29

## 2021-01-08 ENCOUNTER — Ambulatory Visit: Payer: 59

## 2021-01-10 ENCOUNTER — Other Ambulatory Visit: Payer: Self-pay

## 2021-01-10 ENCOUNTER — Ambulatory Visit: Payer: 59

## 2021-01-10 DIAGNOSIS — R2681 Unsteadiness on feet: Secondary | ICD-10-CM

## 2021-01-10 DIAGNOSIS — G8222 Paraplegia, incomplete: Secondary | ICD-10-CM | POA: Diagnosis not present

## 2021-01-10 DIAGNOSIS — M6281 Muscle weakness (generalized): Secondary | ICD-10-CM | POA: Diagnosis not present

## 2021-01-10 DIAGNOSIS — R262 Difficulty in walking, not elsewhere classified: Secondary | ICD-10-CM

## 2021-01-10 NOTE — Therapy (Signed)
[name] [address] [place], [place], [address] Phone: [phone]   Fax:  [phone]  Physical Therapy Treatment/RECERT  Patient Details  Name: Ryan Bush MRN: [id number] Date of Birth: 06-05-1975 Referring Provider (PT): Clarise Cruz   Encounter Date: 01/10/2021   PT End of Session - 01/10/21 1355     Visit Number 27    Number of Visits 62    Date for PT Re-Evaluation 01/24/21    Authorization Type [employer]    Authorization Time Period 06/19/20-08/14/20; 9/10 PN 11/27/20    PT Start Time 0845    PT Stop Time 0929    PT Time Calculation (min) 44 min    Equipment Utilized During Treatment Gait belt    Activity Tolerance Patient tolerated treatment well;Patient limited by fatigue;No increased pain    Behavior During Therapy WFL for tasks assessed/performed             Past Medical History:  Diagnosis Date   HIV (human immunodeficiency virus infection) ([place])     History reviewed. No pertinent surgical history.  There were no vitals filed for this visit.   Subjective Assessment - 01/10/21 1348     Subjective Patient reports frustration with limited ability to ambulate last session. Has been practicing his standing and HEP at home. waiting to hear from [facility] clinic.    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 Patient had PT and OT in the hospital for 3 months. He was DC from PT due to his insurance running out and he needed a different kind of PT. He was discharged home and he had HHPT 2 x week for 1 month. He was dischaged from [place] and was supposed to get out patient PT but his insurance was cancelled. He has a new insurance now and has [place].    Limitations Standing;Walking    How long can you sit comfortably? unlimited    How long can you stand comfortably? 1-3 mins with BUE  support    How long can you walk comfortably? unable    Currently in Pain? No/denies                  Goals:  FOTO 54% STS with mod I and LRAD : occasional min A with knee immobilizers. Able to perform without assistance without immobilizers.  Ambulate 3 ft with min A and LRAD : ambulates 115 ft with RW and Lite Gait with bilateral knee immobilizers.   Treatment:  Supine: -hamstring bent knee with leg on PT shoulder 60 seconds x1 trial each LE; deep pressure applied for tension/tone reduction each LE. ; reports LLE more affected than RLE - posterior calf stretch with progressive DF 60 second holds x1 trial each LE. -single knee to chest with progressive hold x 60 each LE -piriformis stretch, stabilization to limb provided with progressive lengthening x60 second holds -LE rotation x 60 seconds  -LE bridge 10x with stabilization to legs provided    Standing / walking knee immobilizers and lite gait belt donned in supine position, transition to sitting EOB with Min A . Sitting applied lite gait belt to machine and tightened to proper tautness allowing for patient to fully weightbear through LE's with assistance x 2. STS from raised plinth table with standard walker. Upon standing cue for step back ; able to perform with each LE able  to self place leg. Upon standing take step forward pick up and push walker forward, then step again, repeat ;  115 ft total;  one PT assist with pushing lite gait one PT assist in walker placement. CGA x 2. Cues for foot placement and sequencing. Assisted back to wheelchair once he is unable to progress his LLE any further.   Pt educated throughout session about proper posture and technique with exercises. Improved exercise technique, movement at target joints, use of target muscles after min to mod verbal, visual, tactile cues.          Patient is making excellent progression towards functional goals however due to insurance limitations only has  one more approved visit after today's session. Patient will be attending [facility] after last approved PT session. Patient ambulated longest duration to date this session with 115 ft utilizing LiteGait, bilateral knee immobilizers, and RW. Tone is decreased after prolonged muscle lengthening holds prior to ambulation. Ambulating with Lite Gait unweighted for safety performed and tolerated well. Pt will continue to benefit from skilled therapy in order to address remaining goals/deficits still present at this timen of physical therapy at this clinic.           PT Education - 01/10/21 1351     Education Details ambulation, [facility], last PT appointment    Person(s) Educated Patient    Methods Explanation;Demonstration;Verbal cues;Tactile cues    Comprehension Verbalized understanding;Returned demonstration;Verbal cues required;Tactile cues required              PT Short Term Goals - 10/11/20 0941       PT SHORT TERM GOAL #1   Title Patient will be independent in home exercise program to improve strength/mobility for better functional independence with ADLs.    Baseline 06/12/20  pt performing HEP 1x/day. 3/30: performing 1x/day, interested in gym membership 4/27: progressing HEP 6/22: HEP compliant    Time 4    Period Weeks    Status Achieved    Target Date 09/13/20      PT SHORT TERM GOAL #2   Title Patient will be able to stand in parallel bars with support of BUE for 30 seconds with adequate weight bearing through BLE.    Baseline 01/03: less than 30s, 06/12/20  pt able to stand in parallel bars 3 mins with min to mod A cueing for posture and knee blocking.    Time 4    Period Weeks    Status Achieved    Target Date 07/10/20               PT Long Term Goals - 01/10/21 1634       PT LONG TERM GOAL #1   Title Patient will be able to stand in parallel bars with support of BUE for 30-60 seconds with CGA with adequate weight bearing through BLE.    Baseline 01/03:  less than 30s, 06/12/20  pt able to stand in parallel bars 3 mins with min to mod A cueing for posture and knee blocking. 3/30: 60s with mod-max A +2 with blocking bil feet/knees and TC for quad mm activation 4/27: 1 min 15 seconds with min mod blocking of knees 6/22: 62 seconds with blocking of LE's only 8/8: able to perform with knee immobilizers    Time 12    Period Weeks    Status Achieved      PT LONG TERM GOAL #2   Title Patient will increase BLE gross strength half  a grade as to improve functional strength for increased standing tolerance and increased ADL ability.    Baseline 06/12/20 requires max assist for most movements impacting left LE, right min to mod assist depending on muscle group. 3/30: R hip flex, knee ext 3-/5, requires max A for LLE mvmt 4/27: R hip flex 3 knee extension 3- L trace 6/22: see note    Time 2    Period Weeks    Status Partially Met    Target Date 01/24/21      PT LONG TERM GOAL #3   Title Patient will reduce joint contracture in BLE ankle plantarflexion by being able to achieve ankle dorsiflexion to neutral for better functional mobility    Baseline 01/03: RLE patient actively moves it by 4 degrees, unable to actively move LLE into DF, 06/12/20  arom into right DF 6 degrees, left unable, trace contraction dorsal foot with DF attempts on left. 3/30: continues to be limited with achieving neutral, but demo improvements with PROM s/p MT on LLE (~3deg improvement), lacking ~15deg DF on RLE. 4/27: LLE -9 PROM RLE able to AROM to -2 to neutral  with inversion 6/22: able to obtain neutral with overpressure    Time 8    Period Weeks    Status Achieved      PT LONG TERM GOAL #4   Title Patient will be independent with gym program for total body strengthening.    Baseline 4/27: introducing dumbbells 6/22: doing some dumbbell exercises at home. 8/8: compliant with dumbbell exercises    Time 12    Period Weeks    Status Achieved      PT LONG TERM GOAL #5   Title  Patient will increase FOTO score to equal to or greater than 60%    to demonstrate statistically significant improvement in mobility and quality of life.    Baseline 4/27: 42% 6/22: 34% 8/8: 56% 9/21: 54%    Time 2    Period Weeks    Status On-going    Target Date 01/24/21      PT LONG TERM GOAL #6   Title Patient will complete sit <> stand transfer with Mod I and LRAD to improve functional independence for household and community mobility    Baseline able to perform with stabilization to knees in // bars 8/8: needs assistance with knee immobilizers 9/21: occasional min A with knee immobilizers. Able to perform without assistance without immobilizers.    Time 2    Period Weeks    Status Partially Met    Target Date 01/24/21      PT LONG TERM GOAL #7   Title Patient will ambulate at least 3 feet with min A using LRAD to improve mobility for household and community distances    Baseline -unable to ambulate 8/8: 10 ft with LiteGait, RW,and knee immobilizers 9/21; ambulates 115 ft with RW and Lite Gait with bilateral knee immobilizers.    Time 12    Period Weeks    Status Achieved    Target Date 01/24/21                   Plan - 01/10/21 1636     Clinical Impression Statement Patient is making excellent progression towards functional goals however due to insurance limitations only has one more approved visit after today's session. Patient will be attending [facility] after last approved PT session. Patient ambulated longest duration to date this session with 115 ft utilizing LiteGait, bilateral  knee immobilizers, and RW. Tone is decreased after prolonged muscle lengthening holds prior to ambulation. Ambulating with Lite Gait unweighted for safety performed and tolerated well. Pt will continue to benefit from skilled therapy in order to address remaining goals/deficits still present at this timen of physical therapy at this clinic.    Personal Factors and Comorbidities Comorbidity  3+;Time since onset of injury/illness/exacerbation    Comorbidities HIV, Urinary retention with bladder stretch injurt, CNS toxoplasmosis (CMS-HCC), Paraplegia, incomplete, diabetic    Examination-Activity Limitations Bathing    Examination-Participation Restrictions Driving    Stability/Clinical Decision Making Evolving/Moderate complexity    Rehab Potential Fair    PT Frequency 2x / week    PT Duration 12 weeks    PT Treatment/Interventions ADLs/Self Care Home Management;Electrical Stimulation;Moist Heat;Gait training;Stair training;Functional mobility training;Therapeutic activities;Therapeutic exercise;Balance training;Neuromuscular re-education;Wheelchair mobility training;Manual techniques;Passive range of motion;Energy conservation;Orthotic Fit/Training;Biofeedback;Cryotherapy;Ultrasound;DME Instruction;Dry needling;Vestibular;Splinting;Patient/family education;Visual/perceptual remediation/compensation    PT Next Visit Plan ambulate    PT Home Exercise Plan No updates this session;    Consulted and Agree with Plan of Care Patient             Patient will benefit from skilled therapeutic intervention in order to improve the following deficits and impairments:  Decreased range of motion, Decreased strength, Impaired flexibility, Difficulty walking, Decreased mobility, Decreased activity tolerance, Decreased endurance, Impaired sensation, Decreased balance, Impaired tone  Visit Diagnosis: Difficulty in walking, not elsewhere classified  Unsteadiness on feet  Muscle weakness (generalized)     Problem List There are no problems to display for this patient.   Janna Arch, PT, DPT  01/10/2021, 4:38 PM  [place] MAIN [facility] SERVICES [address] [place], [place], [address] Phone: [phone]   Fax:  [phone]  Name: Ryan Bush MRN: [id number] Date of Birth: 1975-12-30

## 2021-01-15 ENCOUNTER — Ambulatory Visit: Payer: 59

## 2021-01-17 ENCOUNTER — Ambulatory Visit: Payer: 59

## 2021-01-17 ENCOUNTER — Other Ambulatory Visit: Payer: Self-pay

## 2021-01-17 DIAGNOSIS — R2681 Unsteadiness on feet: Secondary | ICD-10-CM | POA: Diagnosis not present

## 2021-01-17 DIAGNOSIS — M6281 Muscle weakness (generalized): Secondary | ICD-10-CM | POA: Diagnosis not present

## 2021-01-17 DIAGNOSIS — G8222 Paraplegia, incomplete: Secondary | ICD-10-CM

## 2021-01-17 DIAGNOSIS — R262 Difficulty in walking, not elsewhere classified: Secondary | ICD-10-CM

## 2021-01-17 NOTE — Therapy (Deleted)
[facility] MAIN [facility] SERVICES [address] [place], [place], [address] Phone: [phone]   Fax:  [phone]  January 17, 2021   No Recipients  Physical Therapy Discharge Summary  Patient: Ryan Bush  MRN: [id number]  Date of Birth: 05-03-1975   Diagnosis: Difficulty in walking, not elsewhere classified  Unsteadiness on feet  Muscle weakness (generalized)  Paraplegia, incomplete [facility]) Referring Provider (PT): Felton Clinton   The above patient had been seen in Physical Therapy *** times of *** treatments scheduled with *** no shows and *** cancellations.  The treatment consisted of *** The patient is: {improved/worse/unchanged:3041574}  Subjective: ***  Discharge Findings: ***  Functional Status at Discharge: ***  {ONGEX:[id number]}   Plan - 01/17/21 1027     Clinical Impression Statement Pt assisted with stretching in WC, then author provides maxA for set up of bilateral knee immobilizers, RW, lite gait. Pt able to partake in AMB training, lite gait setup for safety only, no active unloading. Pt AMB 51ft, then asked to break long enough for RW adjustment, then AMB another 24ft. Educated on improved ergonomics with AMB which result in fast speed, decreased RR/DOE, and inmproved subjective reports of efforts. Pt will be DC at this time due to maxing out his available visits with insurance, plans to work with charity clinic and hopes to return in January once his insurance visits start over. Pt would greatly benefit from KAFO in th efuture, would allow similar gait patterns, but allow knee flexion for transfers. Pt could potentially advance to lostrand training in the future if his knee and ankle bracing is successful, however, unclear what insurance limitations exist.    Personal Factors and Comorbidities Comorbidity 3+;Time since onset of injury/illness/exacerbation    Comorbidities HIV, Urinary retention with bladder  stretch injurt, CNS toxoplasmosis (CMS-HCC), Paraplegia, incomplete, diabetic    Examination-Activity Limitations Bathing    Examination-Participation Restrictions Driving    Stability/Clinical Decision Making Evolving/Moderate complexity    Clinical Decision Making Moderate    Rehab Potential Fair    PT Frequency 2x / week    PT Duration 12 weeks    PT Treatment/Interventions ADLs/Self Care Home Management;Electrical Stimulation;Moist Heat;Gait training;Stair training;Functional mobility training;Therapeutic activities;Therapeutic exercise;Balance training;Neuromuscular re-education;Wheelchair mobility training;Manual techniques;Passive range of motion;Energy conservation;Orthotic Fit/Training;Biofeedback;Cryotherapy;Ultrasound;DME Instruction;Dry needling;Vestibular;Splinting;Patient/family education;Visual/perceptual remediation/compensation    PT Next Visit Plan Now DC from PT    PT Home Exercise Plan No updates this session;    Consulted and Agree with Plan of Care Patient             Sincerely,   Rosamaria Lints, PT   CC No Recipients  [facility] MAIN [facility] SERVICES [address] [place], [place], [address] Phone: [phone]   Fax:  [phone]  Patient: Ryan Bush  MRN: [id number]  Date of Birth: 05-16-1975

## 2021-01-17 NOTE — Therapy (Signed)
[place] MAIN [facility] SERVICES [address] [place], [place], [address] Phone: [phone]   Fax:  [phone]  Physical Therapy Treatment/Discharge  Physical Therapy Progress Note   Dates of reporting period  11/27/20   to   01/17/21   Patient Details  Name: Ryan Bush MRN: [id number] Date of Birth: [birth year] Referring Provider (PT): Clarise Cruz   Encounter Date: 01/17/2021   PT End of Session - 01/17/21 1022     Visit Number 60    Number of Visits 18    Date for PT Re-Evaluation 01/24/21    Authorization Type [employer]    Authorization Time Period 01/10/21-01/24/21    PT Start Time 0850    PT Stop Time 0945    PT Time Calculation (min) 55 min    Equipment Utilized During Treatment Other (comment)   Lite gait   Activity Tolerance Patient tolerated treatment well;Patient limited by fatigue;No increased pain    Behavior During Therapy WFL for tasks assessed/performed             Past Medical History:  Diagnosis Date   HIV (human immunodeficiency virus infection) ([place])     No past surgical history on file.  There were no vitals filed for this visit.   Subjective Assessment - 01/17/21 1020     Subjective Pt arrives for session ready to AMB further, denies any updates since prior visit, denies any outstanding questions prior to DC. Pt aware today is his last visit and will plan to start services with a local chairty PT clinic soon. Interpreter Leola Brazil is here for Spanish language interpreting.    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 Patient had PT and OT in the hospital for 3 months. He was DC from PT due to his insurance running out and he needed a different kind of PT. He was discharged home and he had HHPT 2 x week for 1 month. He was dischaged from [place] and was supposed to get out patient PT but his  insurance was cancelled. He has a new insurance now and has [place].    Limitations Standing;Walking    How long can you sit comfortably? unlimited    How long can you stand comfortably? 1-3 mins with BUE support    How long can you walk comfortably? does not walk since SCI    Patient Stated Goals to walk sideways at his house    Currently in Pain? No/denies              Intervention : -WC based P/ROM Stretching 2x60sec hamstrings, ankle DF, SKTC -Total Assist KI donning bilat, squat pivot transfer WC to table maxA  -Total A setup of Lite Gait for safety support in gait, no unloading -MaxA STS transfer c RW -AMB 68f c RW, minGuardA, Litegait support -Seated break for RW adjustment down (decreasing elbow flexion during support -education on forward trunk excusrion for passive swing phase from extension to neutral.  -MaxA STS c RW -AMB 961f 4-point gait with RW, improve elbow extension, forward COM excursion, passive swing phase bilat, toes drag is not prohibitive, but likely detracts from ergonomics -[facility] -MAXA STS c RW, then change from stnadard WC to pt''s liteweight WC -Doffing bilat KI       PT Education - 01/17/21 1022     Education Details ergonomic strategeies for RW[employer] & Johnson  gait    Person(s) Educated Patient    Methods Explanation;Demonstration    Comprehension Verbalized understanding;Returned demonstration              PT Short Term Goals - 10/11/20 0941       PT SHORT TERM GOAL #1   Title Patient will be independent in home exercise program to improve strength/mobility for better functional independence with ADLs.    Baseline 06/12/20  pt performing HEP 1x/day. 3/30: performing 1x/day, interested in gym membership 4/27: progressing HEP 6/22: HEP compliant    Time 4    Period Weeks    Status Achieved    Target Date 09/13/20      PT SHORT TERM GOAL #2   Title Patient will be able to stand in parallel bars with support of BUE for  30 seconds with adequate weight bearing through BLE.    Baseline 01/03: less than 30s, 06/12/20  pt able to stand in parallel bars 3 mins with min to mod A cueing for posture and knee blocking.    Time 4    Period Weeks    Status Achieved    Target Date 07/10/20               PT Long Term Goals - 01/10/21 1634       PT LONG TERM GOAL #1   Title Patient will be able to stand in parallel bars with support of BUE for 30-60 seconds with CGA with adequate weight bearing through BLE.    Baseline 01/03: less than 30s, 06/12/20  pt able to stand in parallel bars 3 mins with min to mod A cueing for posture and knee blocking. 3/30: 60s with mod-max A +2 with blocking bil feet/knees and TC for quad mm activation 4/27: 1 min 15 seconds with min mod blocking of knees 6/22: 62 seconds with blocking of LE's only 8/8: able to perform with knee immobilizers    Time 12    Period Weeks    Status Achieved      PT LONG TERM GOAL #2   Title Patient will increase BLE gross strength half a grade as to improve functional strength for increased standing tolerance and increased ADL ability.    Baseline 06/12/20 requires max assist for most movements impacting left LE, right min to mod assist depending on muscle group. 3/30: R hip flex, knee ext 3-/5, requires max A for LLE mvmt 4/27: R hip flex 3 knee extension 3- L trace 6/22: see note    Time 2    Period Weeks    Status Partially Met    Target Date 01/24/21      PT LONG TERM GOAL #3   Title Patient will reduce joint contracture in BLE ankle plantarflexion by being able to achieve ankle dorsiflexion to neutral for better functional mobility    Baseline 01/03: RLE patient actively moves it by 4 degrees, unable to actively move LLE into DF, 06/12/20  arom into right DF 6 degrees, left unable, trace contraction dorsal foot with DF attempts on left. 3/30: continues to be limited with achieving neutral, but demo improvements with PROM s/p MT on LLE (~3deg  improvement), lacking ~15deg DF on RLE. 4/27: LLE -9 PROM RLE able to AROM to -2 to neutral  with inversion 6/22: able to obtain neutral with overpressure    Time 8    Period Weeks    Status Achieved      PT LONG TERM GOAL #4  Title Patient will be independent with gym program for total body strengthening.    Baseline 4/27: introducing dumbbells 6/22: doing some dumbbell exercises at home. 8/8: compliant with dumbbell exercises    Time 12    Period Weeks    Status Achieved      PT LONG TERM GOAL #5   Title Patient will increase FOTO score to equal to or greater than 60%    to demonstrate statistically significant improvement in mobility and quality of life.    Baseline 4/27: 42% 6/22: 34% 8/8: 56% 9/21: 54%    Time 2    Period Weeks    Status On-going    Target Date 01/24/21      PT LONG TERM GOAL #6   Title Patient will complete sit <> stand transfer with Mod I and LRAD to improve functional independence for household and community mobility    Baseline able to perform with stabilization to knees in // bars 8/8: needs assistance with knee immobilizers 9/21: occasional min A with knee immobilizers. Able to perform without assistance without immobilizers.    Time 2    Period Weeks    Status Partially Met    Target Date 01/24/21      PT LONG TERM GOAL #7   Title Patient will ambulate at least 3 feet with min A using LRAD to improve mobility for household and community distances    Baseline -unable to ambulate 8/8: 10 ft with LiteGait, RW,and knee immobilizers 9/21; ambulates 115 ft with RW and Lite Gait with bilateral knee immobilizers.    Time 12    Period Weeks    Status Achieved    Target Date 01/24/21                   Plan - 01/17/21 1027     Clinical Impression Statement Pt assisted with stretching in WC, then author provides maxA for set up of bilateral knee immobilizers, RW, lite gait. Pt able to partake in AMB training, lite gait setup for safety only, no  active unloading. Pt AMB 38f, then asked to break long enough for RW adjustment, then AMB another 962f Educated on improved ergonomics with AMB which result in fast speed, decreased RR/DOE, and inmproved subjective reports of efforts. Pt will be DC at this time due to maxing out his available visits with insurance, plans to work with charity clinic and hopes to return in January once his insurance visits start over. Pt would greatly benefit from KAFO in th efuture, would allow similar gait patterns, but allow knee flexion for transfers. Pt could potentially advance to lostrand training in the future if his knee and ankle bracing is successful, however, unclear what insurance limitations exist.    Personal Factors and Comorbidities Comorbidity 3+;Time since onset of injury/illness/exacerbation    Comorbidities HIV, Urinary retention with bladder stretch injurt, CNS toxoplasmosis (CMS-HCC), Paraplegia, incomplete, diabetic    Examination-Activity Limitations Bathing    Examination-Participation Restrictions Driving    Stability/Clinical Decision Making Evolving/Moderate complexity    Clinical Decision Making Moderate    Rehab Potential Fair    PT Frequency 2x / week    PT Duration 12 weeks    PT Treatment/Interventions ADLs/Self Care Home Management;Electrical Stimulation;Moist Heat;Gait training;Stair training;Functional mobility training;Therapeutic activities;Therapeutic exercise;Balance training;Neuromuscular re-education;Wheelchair mobility training;Manual techniques;Passive range of motion;Energy conservation;Orthotic Fit/Training;Biofeedback;Cryotherapy;Ultrasound;DME Instruction;Dry needling;Vestibular;Splinting;Patient/family education;Visual/perceptual remediation/compensation    PT Next Visit Plan Now DC from PT    PT Home Exercise Plan No updates this session;  Consulted and Agree with Plan of Care Patient             Patient will benefit from skilled therapeutic intervention in  order to improve the following deficits and impairments:  Decreased range of motion, Decreased strength, Impaired flexibility, Difficulty walking, Decreased mobility, Decreased activity tolerance, Decreased endurance, Impaired sensation, Decreased balance, Impaired tone  Visit Diagnosis: Difficulty in walking, not elsewhere classified  Unsteadiness on feet  Muscle weakness (generalized)  Paraplegia, incomplete (HCC)     Problem List There are no problems to display for this patient.  10:44 AM, 01/17/21 Etta Grandchild, PT, DPT Physical Therapist - [facility]  Outpatient Physical Therapy- [place] [phone]     [place], [place] 01/17/2021, 10:37 AM  [place] MAIN [facility] SERVICES [address] [place], [place], [address] Phone: [phone]   Fax:  [phone]  Name: Amirr Achord MRN: [id number] Date of Birth: 04-20-1976

## 2021-01-22 ENCOUNTER — Ambulatory Visit: Payer: 59

## 2021-01-24 ENCOUNTER — Ambulatory Visit: Payer: 59

## 2021-01-24 DIAGNOSIS — R339 Retention of urine, unspecified: Secondary | ICD-10-CM | POA: Diagnosis not present

## 2021-01-29 ENCOUNTER — Ambulatory Visit: Payer: 59

## 2021-01-31 ENCOUNTER — Ambulatory Visit: Payer: 59

## 2021-02-01 DIAGNOSIS — B20 Human immunodeficiency virus [HIV] disease: Principal | ICD-10-CM

## 2021-02-01 MED ORDER — BICTEGRAVIR 50 MG-EMTRICITABINE 200 MG-TENOFOVIR ALAFENAM 25 MG TABLET
ORAL_TABLET | Freq: Every day | ORAL | 9 refills | 30 days | Status: CP
Start: 2021-02-01 — End: 2022-02-01

## 2021-02-05 ENCOUNTER — Ambulatory Visit: Payer: 59

## 2021-02-07 ENCOUNTER — Ambulatory Visit: Payer: 59

## 2021-02-12 ENCOUNTER — Ambulatory Visit: Payer: 59

## 2021-02-14 ENCOUNTER — Ambulatory Visit: Payer: 59

## 2021-02-19 ENCOUNTER — Ambulatory Visit: Payer: 59

## 2021-02-21 ENCOUNTER — Ambulatory Visit: Payer: 59

## 2021-02-21 DIAGNOSIS — R339 Retention of urine, unspecified: Secondary | ICD-10-CM | POA: Diagnosis not present

## 2021-02-26 ENCOUNTER — Ambulatory Visit: Payer: 59

## 2021-02-28 ENCOUNTER — Ambulatory Visit: Payer: 59

## 2021-03-05 ENCOUNTER — Ambulatory Visit: Payer: 59

## 2021-03-07 ENCOUNTER — Ambulatory Visit: Payer: 59

## 2021-03-12 ENCOUNTER — Ambulatory Visit: Payer: 59

## 2021-03-14 ENCOUNTER — Ambulatory Visit: Payer: 59

## 2021-03-19 ENCOUNTER — Ambulatory Visit: Payer: 59

## 2021-03-21 ENCOUNTER — Ambulatory Visit: Payer: 59

## 2021-03-23 DIAGNOSIS — R339 Retention of urine, unspecified: Secondary | ICD-10-CM | POA: Diagnosis not present

## 2021-03-26 ENCOUNTER — Ambulatory Visit: Payer: 59

## 2021-03-28 ENCOUNTER — Ambulatory Visit: Payer: 59

## 2021-04-02 ENCOUNTER — Ambulatory Visit: Payer: 59

## 2021-04-04 ENCOUNTER — Ambulatory Visit: Payer: 59

## 2021-04-09 ENCOUNTER — Ambulatory Visit: Payer: 59

## 2021-04-11 ENCOUNTER — Ambulatory Visit: Payer: 59

## 2021-04-18 ENCOUNTER — Ambulatory Visit: Payer: 59

## 2021-04-25 ENCOUNTER — Ambulatory Visit: Payer: 59

## 2021-04-25 DIAGNOSIS — R339 Retention of urine, unspecified: Secondary | ICD-10-CM | POA: Diagnosis not present

## 2021-04-27 IMAGING — US US EXTREM LOW VENOUS*L*
1 series · 13 of 24 positions shown · non-contrast
Comparison: None.

CLINICAL DATA: Pain and swelling.  Left foot swelling today.



[Series 1: us extrem low venous*left* · 13 of 33 slices shown]
[im 1/33]
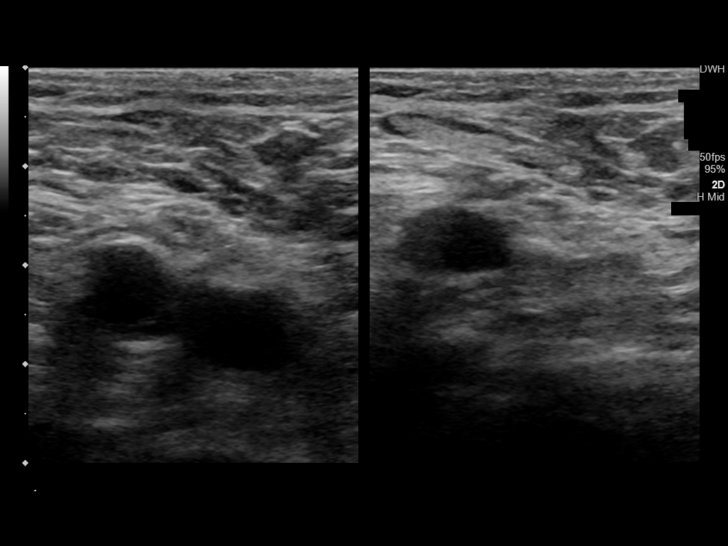
[im 3/33]
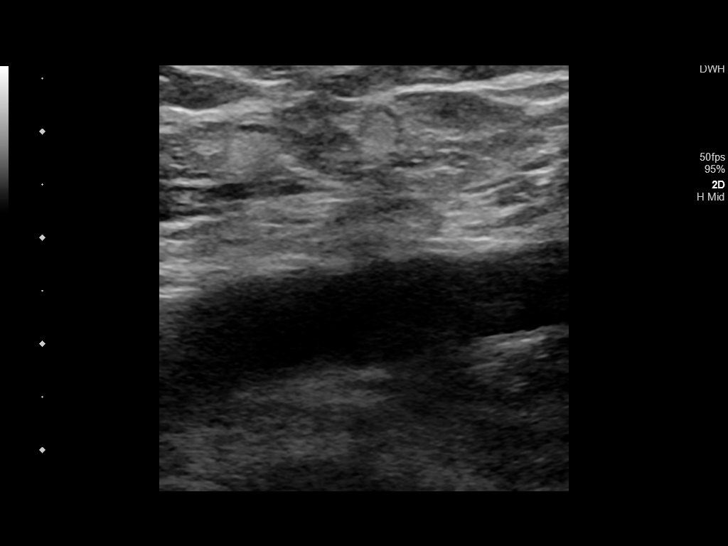
[im 6/33]
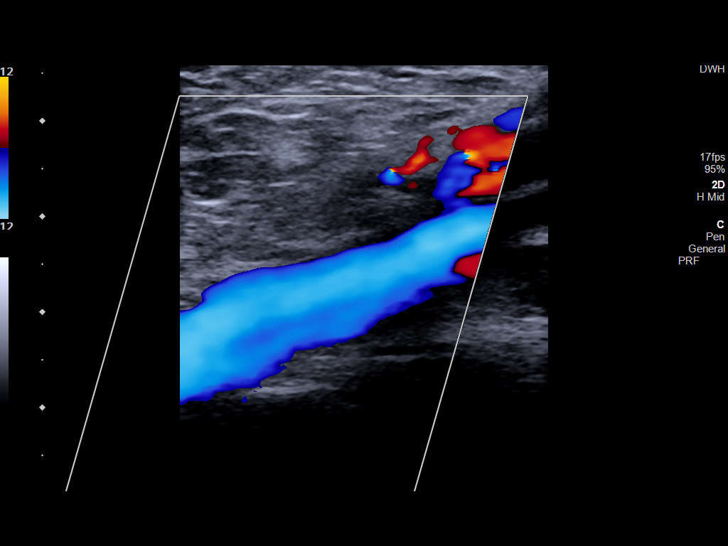
[im 9/33]
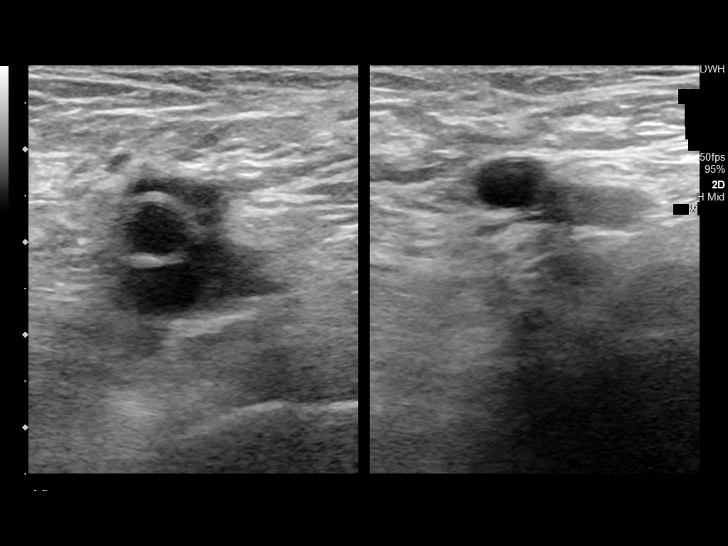
[im 12/33]
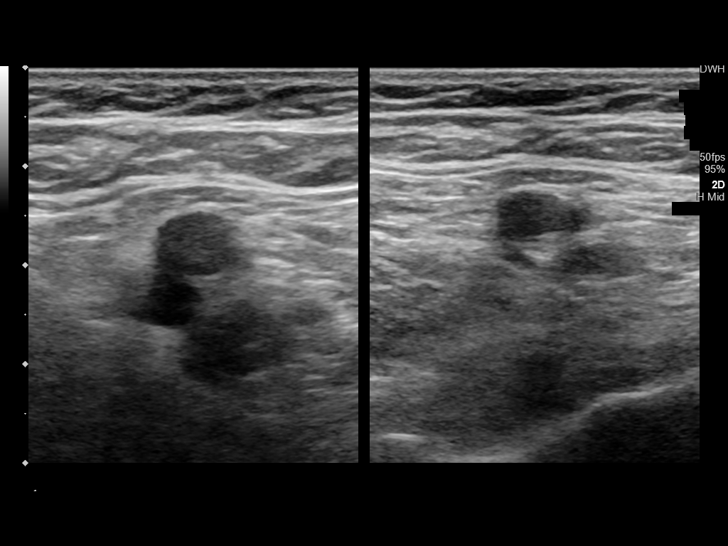
[im 14/33]
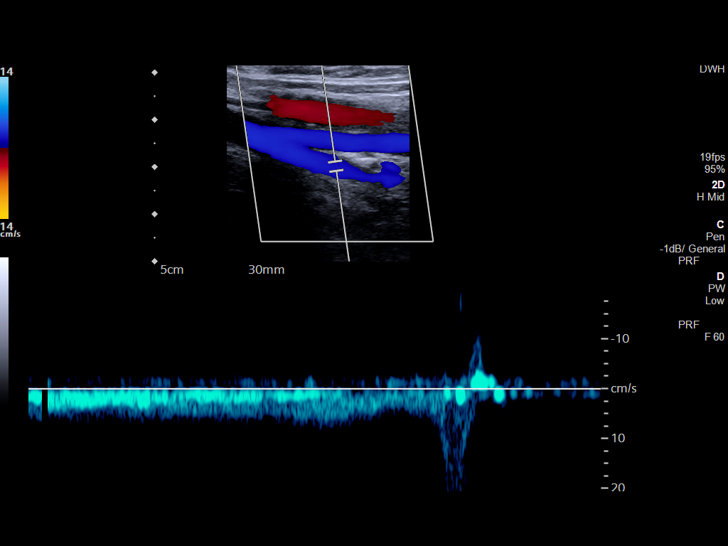
[im 17/33]
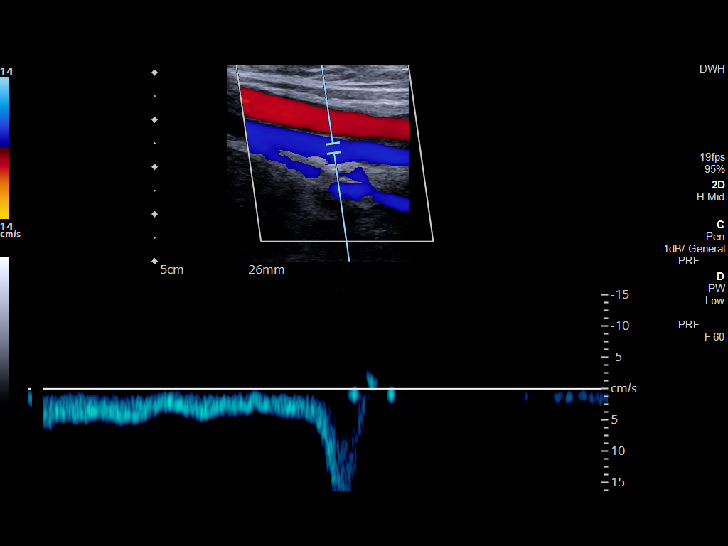
[im 19/33]
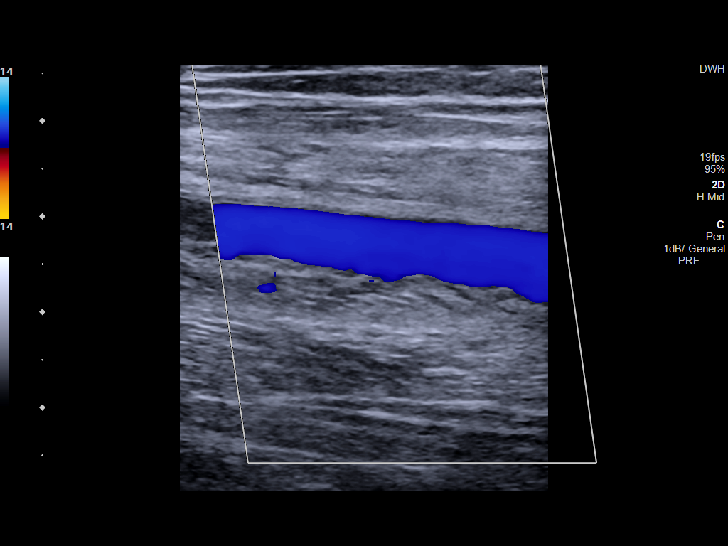
[im 21/33]
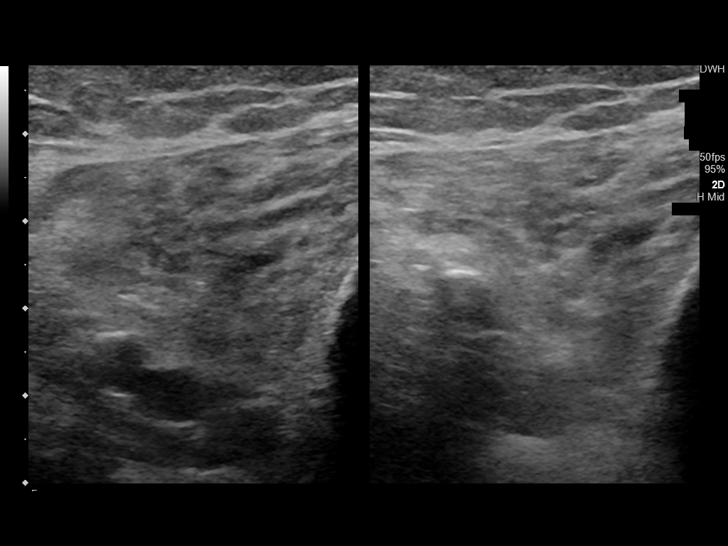
[im 24/33]
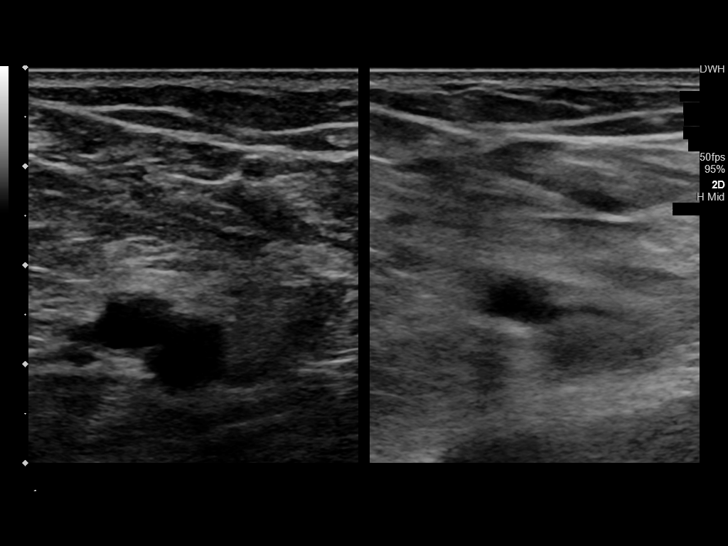
[im 27/33]
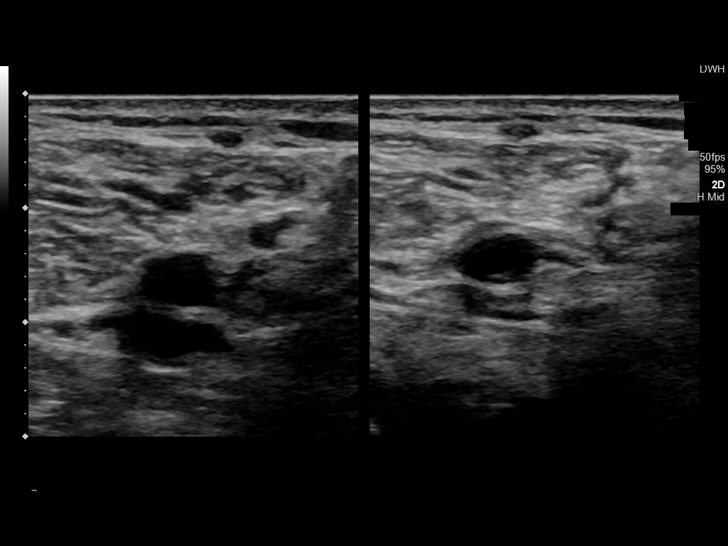
[im 30/33]
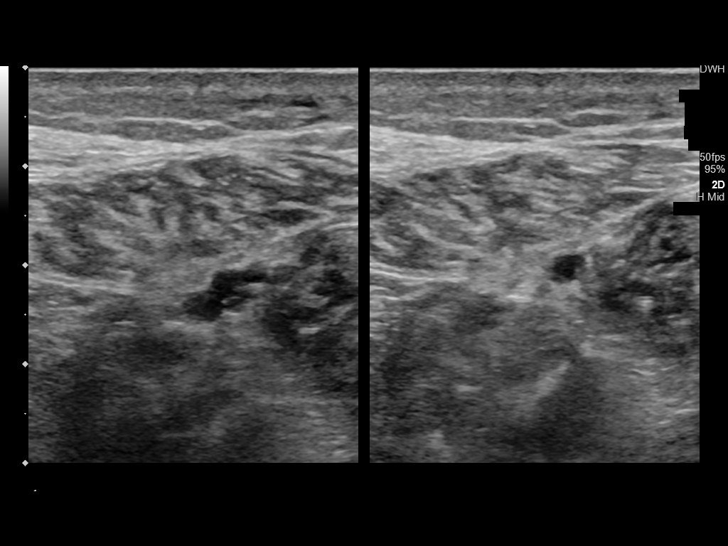
[im 33/33]
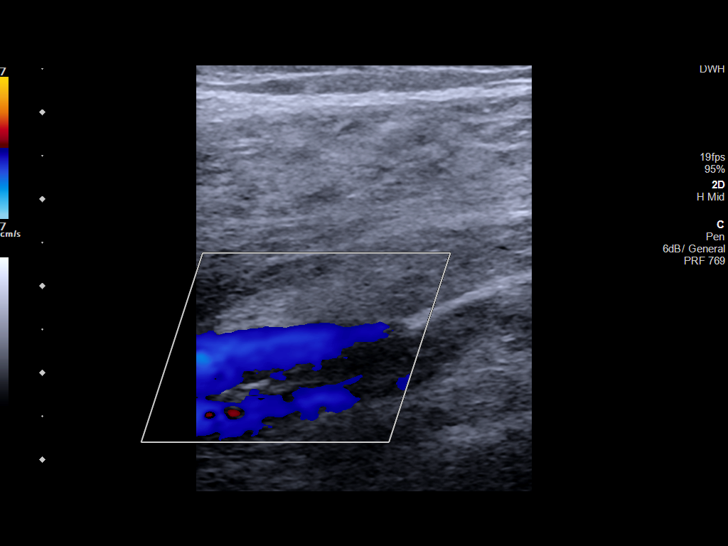

[13 of 24 positions shown; findings below may reference images not displayed]

FINDINGS: Contralateral Common Femoral Vein: Respiratory phasicity is normal
and symmetric with the symptomatic side. No evidence of thrombus.
Normal compressibility.

Common Femoral Vein: No evidence of thrombus. Normal
compressibility, respiratory phasicity and response to augmentation.

Saphenofemoral Junction: No evidence of thrombus. Normal
compressibility and flow on color Doppler imaging.

Profunda Femoral Vein: No evidence of thrombus. Normal
compressibility and flow on color Doppler imaging.

Femoral Vein: No evidence of thrombus. Normal compressibility,
respiratory phasicity and response to augmentation.

Popliteal Vein: No evidence of thrombus. Normal compressibility,
respiratory phasicity and response to augmentation.

Calf Veins: No evidence of thrombus. Normal compressibility and flow
on color Doppler imaging.

Superficial Great Saphenous Vein: No evidence of thrombus. Normal
compressibility.

Venous Reflux:  None.

Other Findings:  None.
IMPRESSION: No evidence of left lower extremity deep venous thrombosis.

## 2021-04-30 ENCOUNTER — Ambulatory Visit: Payer: 59

## 2021-05-02 ENCOUNTER — Ambulatory Visit: Payer: 59

## 2021-05-03 DIAGNOSIS — Z139 Encounter for screening, unspecified: Secondary | ICD-10-CM

## 2021-05-07 ENCOUNTER — Ambulatory Visit: Payer: 59

## 2021-05-09 ENCOUNTER — Ambulatory Visit: Payer: 59

## 2021-05-10 LAB — GLUCOSE, POCT (MANUAL RESULT ENTRY): POC Glucose: 92 mg/dl (ref 70–99)

## 2021-05-10 NOTE — Congregational Nurse Program (Signed)
°  Dept: [phone]   Congregational Nurse Program Note  Date of Encounter: 05/10/2021  Past Medical History: Past Medical History:  Diagnosis Date   HIV (human immunodeficiency virus infection) (HCC)     Encounter Details:  CNP Questionnaire - 05/10/21 1703       Questionnaire   Do you give verbal consent to treat you today? Yes    Location Patient Served  [facility]    Visit Setting Church or Organization    Patient Status Immigrant    Engineer, building services or [place] Insurance    Insurance Referral Charitable Care;[facility] Financial Assistance    Medication Referred to Medication Assistance;Have Medication Insecurities    Medical Provider Yes    Screening Referrals Annual Wellness Visit    Medical Referral [facility] PCP[facility]Clinic    Medical Appointment Made N/A    Food N/A    Transportation N/A    Housing/Utilities N/A    Interpersonal Safety N/A    Intervention Blood pressure;Navigate Healthcare System;Educate;Case Management    ED Visit Averted N/A    Life-Saving Intervention Made N/A             Patient given clothing and food. [facility], medication management clinic, charity care,  financial assistance applications given to patient.  Vitals with BMI 05/10/2021 04/24/2020 11/13/2019  Height - - -  Weight - - -  BMI - - -  Systolic 141 145 240  Diastolic 87 90 100  Pulse 78 102 89

## 2021-05-10 NOTE — Congregational Nurse Program (Signed)
°  Dept: [phone]   Congregational Nurse Program Note  Date of Encounter: 05/03/2021  Past Medical History: Past Medical History:  Diagnosis Date   HIV (human immunodeficiency virus infection) (HCC)     Encounter Details:  CNP Questionnaire - 05/10/21 1656       Questionnaire   Do you give verbal consent to treat you today? Yes    Location Patient Served  [facility]    Visit Setting [facility] or Organization    Patient Status Immigrant    Engineer, building services or [place] Insurance    Insurance Referral Charitable Care;[facility] Financial Assistance    Medication Referred to Medication Assistance;Have Medication Insecurities    Medical Provider Yes    Screening Referrals N/A    Medical Referral [facility] PCP/Clinic    Medical Appointment Made N/A    Food N/A    Transportation N/A    Housing/Utilities N/A    Interpersonal Safety N/A    Intervention Blood glucose;Blood pressure;Navigate Healthcare System;Educate;Case Management    ED Visit Averted N/A    Life-Saving Intervention Made N/A             Patient seen today for health insurance resources. Patient needs assistance in finding a low cost PT, due to insurance insecurities. Patient was referred to [facility]

## 2021-05-14 ENCOUNTER — Ambulatory Visit: Payer: 59

## 2021-05-16 ENCOUNTER — Ambulatory Visit: Payer: 59

## 2021-05-21 ENCOUNTER — Ambulatory Visit: Payer: 59

## 2021-05-22 DIAGNOSIS — R339 Retention of urine, unspecified: Secondary | ICD-10-CM | POA: Diagnosis not present

## 2021-05-23 ENCOUNTER — Ambulatory Visit: Payer: 59

## 2021-05-28 ENCOUNTER — Ambulatory Visit: Payer: 59

## 2021-05-30 ENCOUNTER — Ambulatory Visit: Payer: 59

## 2021-06-04 DIAGNOSIS — B20 Human immunodeficiency virus [HIV] disease: Principal | ICD-10-CM

## 2021-06-04 MED ORDER — BICTEGRAVIR 50 MG-EMTRICITABINE 200 MG-TENOFOVIR ALAFENAM 25 MG TABLET
ORAL_TABLET | Freq: Every day | ORAL | 1 refills | 30.00000 days | Status: CP
Start: 2021-06-04 — End: 2022-06-04

## 2021-06-18 ENCOUNTER — Ambulatory Visit: Admit: 2021-06-18 | Discharge: 2021-06-19 | Payer: PRIVATE HEALTH INSURANCE

## 2021-06-18 DIAGNOSIS — Z23 Encounter for immunization: Secondary | ICD-10-CM | POA: Diagnosis not present

## 2021-06-18 DIAGNOSIS — R69 Illness, unspecified: Secondary | ICD-10-CM | POA: Diagnosis not present

## 2021-06-18 DIAGNOSIS — B20 Human immunodeficiency virus [HIV] disease: Principal | ICD-10-CM

## 2021-06-18 MED ORDER — BICTEGRAVIR 50 MG-EMTRICITABINE 200 MG-TENOFOVIR ALAFENAM 25 MG TABLET
ORAL_TABLET | Freq: Every day | ORAL | 11 refills | 30.00000 days | Status: CP
Start: 2021-06-18 — End: 2022-06-18

## 2021-06-21 DIAGNOSIS — R339 Retention of urine, unspecified: Secondary | ICD-10-CM | POA: Diagnosis not present

## 2021-07-12 NOTE — Congregational Nurse Program (Signed)
?  Dept: [phone] ? ? ?Congregational Nurse Program Note ? ?Date of Encounter: 07/12/2021 ? ?Past Medical History: ?Past Medical History:  ?Diagnosis Date  ? HIV (human immunodeficiency virus infection) (HCC)   ? ? ?Encounter Details: ? CNP Questionnaire - 07/12/21 1543   ? ?  ? Questionnaire  ? Do you give verbal consent to treat you today? Yes   ? Location Patient Served  [facility]   ? Visit Setting Church or Organization   ? Patient Status Immigrant   ? Insurance Uninsured ([place]/Care Connects/Self-Pay)   ? Insurance Referral Charitable Care   ? Medication Referred to Medication Assistance;Have Medication Insecurities   ? Medical Provider Yes   ? Screening Referrals N/A   ? Medical Referral [facility] PCP/Clinic   ? Medical Appointment Made N/A   ? Food N/A   ? Transportation N/A   ? Housing/Utilities N/A   ? Interpersonal Safety N/A   ? Intervention [employer];Case Management   ? ED Visit Averted N/A   ? Life-Saving Intervention Made N/A   ? ?  ?  ? ?  ? ? ?Patient was called to see if he had reached out to his provider/clinic at [facility] for financial assistance since his insurance will soon expire. Patient verbalized that he would call tomorrow. Patient states he will also call [facility] to let them know as well.  ? ? ?

## 2021-07-16 DIAGNOSIS — E119 Type 2 diabetes mellitus without complications: Secondary | ICD-10-CM | POA: Diagnosis not present

## 2021-07-16 DIAGNOSIS — N319 Neuromuscular dysfunction of bladder, unspecified: Secondary | ICD-10-CM | POA: Diagnosis not present

## 2021-07-16 DIAGNOSIS — I998 Other disorder of circulatory system: Secondary | ICD-10-CM | POA: Diagnosis not present

## 2021-07-16 DIAGNOSIS — Z0000 Encounter for general adult medical examination without abnormal findings: Secondary | ICD-10-CM | POA: Diagnosis not present

## 2021-07-16 DIAGNOSIS — R69 Illness, unspecified: Secondary | ICD-10-CM | POA: Diagnosis not present

## 2021-07-20 DIAGNOSIS — Z1211 Encounter for screening for malignant neoplasm of colon: Secondary | ICD-10-CM | POA: Diagnosis not present

## 2021-07-20 DIAGNOSIS — R339 Retention of urine, unspecified: Secondary | ICD-10-CM | POA: Diagnosis not present

## 2021-08-16 DIAGNOSIS — Z0000 Encounter for general adult medical examination without abnormal findings: Secondary | ICD-10-CM | POA: Diagnosis not present

## 2021-08-16 DIAGNOSIS — R339 Retention of urine, unspecified: Secondary | ICD-10-CM | POA: Diagnosis not present

## 2021-08-16 DIAGNOSIS — R030 Elevated blood-pressure reading, without diagnosis of hypertension: Secondary | ICD-10-CM | POA: Diagnosis not present

## 2021-08-30 MED ORDER — ASPIRIN 81 MG CHEWABLE TABLET
ORAL_TABLET | Freq: Every day | ORAL | 11 refills | 30 days | Status: CP
Start: 2021-08-30 — End: 2022-08-30

## 2021-08-30 MED ORDER — ATORVASTATIN 20 MG TABLET
ORAL_TABLET | Freq: Every day | ORAL | 11 refills | 30 days | Status: CP
Start: 2021-08-30 — End: 2022-08-30

## 2021-09-13 DIAGNOSIS — R339 Retention of urine, unspecified: Secondary | ICD-10-CM | POA: Diagnosis not present

## 2021-09-20 NOTE — Congregational Nurse Program (Signed)
  Dept: [phone]   Congregational Nurse Program Note  Date of Encounter: 09/20/2021  Past Medical History: Past Medical History:  Diagnosis Date   HIV (human immunodeficiency virus infection) (HCC)     Encounter Details:  CNP Questionnaire - 09/20/21 1352       Questionnaire   Do you give verbal consent to treat you today? Yes    Location Patient Served  [facility]    Visit Setting Church or Organization    Patient Status Immigrant    Insurance Uninsured ([place] Card/Care Connects/Self-Pay)    Corporate treasurer Care;N/A    Medication Have Medication Insecurities    Medical Provider Yes    Screening Referrals N/A    Medical Referral N/A    Medical Appointment Made N/A    Food N/A    Transportation N/A    Housing/Utilities N/A    Interpersonal Safety N/A    Intervention Case Management    ED Visit Averted N/A    Life-Saving Intervention Made N/A             Patient was asked if [facility] clinic needed anything other document faxed over. Patient stated the [facility] clinic had received all necessary paperwork through fax. Fax was sent on 09/06/21.

## 2021-10-11 NOTE — Congregational Nurse Program (Signed)
  Dept: [phone]   Congregational Nurse Program Note  Date of Encounter: 10/11/2021  Past Medical History: Past Medical History:  Diagnosis Date   HIV (human immunodeficiency virus infection) (HCC)     Encounter Details:  CNP Questionnaire - 10/11/21 1705       Questionnaire   Do you give verbal consent to treat you today? Yes    Location Patient Served  [facility]    Visit Setting Phone/Text/Email    Patient Status Immigrant    Insurance Uninsured ([employer] Card/Care Connects/Self-Pay)    Insurance Referral Charitable Care    Medication Have Medication Insecurities    Medical Provider Yes    Screening Referrals N/A    Medical Referral N/A    Medical Appointment Made N/A    Food N/A    Transportation N/A    Housing/Utilities N/A    Interpersonal Safety N/A    Intervention Case Management    ED Visit Averted N/A    Life-Saving Intervention Made N/A            Followed up with patient, he stated he was unable to  attend his appt on 10/11/21 for therapy d/t climate.

## 2021-10-26 MED ORDER — CATHETER 14 FR
11 refills | 0 days | Status: CP
Start: 2021-10-26 — End: ?

## 2021-10-31 MED ORDER — CATHETER 14 FR
11 refills | 0 days | Status: CP
Start: 2021-10-31 — End: ?

## 2021-11-06 DIAGNOSIS — G8220 Paraplegia, unspecified: Principal | ICD-10-CM

## 2021-11-08 ENCOUNTER — Ambulatory Visit: Admit: 2021-11-08 | Discharge: 2021-11-09 | Attending: Urology | Primary: Urology

## 2021-11-08 MED ORDER — SILDENAFIL 100 MG TABLET
ORAL_TABLET | Freq: Every day | ORAL | prn refills | 30 days | Status: CP | PRN
Start: 2021-11-08 — End: ?

## 2021-11-15 NOTE — Congregational Nurse Program (Signed)
  Dept: [phone]   Congregational Nurse Program Note  Date of Encounter: 11/15/2021  Past Medical History: Past Medical History:  Diagnosis Date   HIV (human immunodeficiency virus infection) (HCC)     Encounter Details:  CNP Questionnaire - 11/15/21 1725       Questionnaire   Do you give verbal consent to treat you today? Yes    Location Patient Served  [facility]    Visit Setting Phone/Text/Email    Patient Status Immigrant    Insurance Uninsured ([place] Card/Care Connects/Self-Pay)    Corporate treasurer Care    Medication Have Medication Insecurities    Medical Provider Yes    Screening Referrals N/A    Medical Referral N/A    Medical Appointment Made N/A    Food N/A    Transportation N/A    Housing/Utilities N/A    Interpersonal Safety N/A    Intervention Case Management    ED Visit Averted N/A    Life-Saving Intervention Made N/A              Patient requested a phone call, he is inquiring about rent assistance. Patient was given the update that [facility] is no longer helping with rent assistance. Patient verbalized understanding.

## 2021-11-29 NOTE — Congregational Nurse Program (Signed)
  Dept: [phone]   Congregational Nurse Program Note  Date of Encounter: 11/29/2021  Past Medical History: Past Medical History:  Diagnosis Date   HIV (human immunodeficiency virus infection) (HCC)     Encounter Details:  CNP Questionnaire - 11/29/21 1330       Questionnaire   Do you give verbal consent to treat you today? Yes    Location Patient Served  [facility]    Visit Setting Phone/Text/Email    Patient Status Immigrant    Insurance Uninsured ([place] Card/Care Connects/Self-Pay)    Insurance Referral N/A    Medication Have Medication Insecurities    Medical Provider Yes    Screening Referrals N/A    Medical Referral Other   [facility], [place], [facility] ID clinic   Medical Appointment Made N/A    Food N/A    Transportation N/A    Housing/Utilities N/A    Economist N/A    Intervention Case Management;Navigate Healthcare System    ED Visit Averted N/A    Life-Saving Intervention Made N/A              Patient referred to [employer]. They will be supplying him with a used wheelchair and giving him caregiver resources.

## 2021-12-13 NOTE — Congregational Nurse Program (Signed)
  Dept: [phone]   Congregational Nurse Program Note  Date of Encounter: 12/13/2021  Past Medical History: Past Medical History:  Diagnosis Date   HIV (human immunodeficiency virus infection) (HCC)     Encounter Details:  CNP Questionnaire - 12/13/21 1754       Questionnaire   Do you give verbal consent to treat you today? Yes    Location Patient Served  [facility]    Visit Setting Phone/Text/Email    Patient Status Immigrant    Insurance Uninsured ([place] Card/Care Connects/Self-Pay)    Insurance Referral N/A    Medication Have Medication Insecurities    Medical Provider Yes    Screening Referrals N/A    Medical Referral Other   [place] [facility], [place], [place] [facility]   Medical Appointment Made N/A    Food N/A    Transportation N/A    Housing/Utilities N/A    Interpersonal Safety N/A    Intervention Case Management;Navigate Healthcare System    ED Visit Averted N/A    Life-Saving Intervention Made N/A              Patient called to remind them of a meeting to assess patients further needs, patient states d/t transportation he will be unable to meet with me

## 2021-12-29 NOTE — Congregational Nurse Program (Signed)
  Dept: [phone]   Congregational Nurse Program Note  Date of Encounter: 12/27/2021  Past Medical History: Past Medical History:  Diagnosis Date   HIV (human immunodeficiency virus infection) (HCC)     Encounter Details:  CNP Questionnaire - 12/27/21 1743       Questionnaire   Do you give verbal consent to treat you today? Yes    Location Patient Served  [facility]    Visit Setting Phone/Text/Email    Patient Status Immigrant    Insurance Uninsured ([place] Card/Care Connects/Self-Pay)    Insurance Referral N/A    Medication Have Medication Insecurities    Medical Provider Yes    Screening Referrals N/A    Medical Referral Other   [place] [facility], [place], [place] [facility]   Medical Appointment Made N/A    Food N/A    Transportation N/A    Housing/Utilities N/A    Interpersonal Safety N/A    Intervention Case Management;Navigate Healthcare System    ED Visit Averted N/A    Life-Saving Intervention Made N/A             Patient was taken adult diapers, clothing and food pantry items to his home. Patient informed me he has attempted to get in contact with urology [facility] to obtain more catheter supplies sent to his home. He will continue to attempt to call [facility] urology clinic.

## 2022-01-14 ENCOUNTER — Ambulatory Visit: Admit: 2022-01-14 | Discharge: 2022-01-15 | Attending: Family | Primary: Family

## 2022-01-14 DIAGNOSIS — Z23 Encounter for immunization: Principal | ICD-10-CM

## 2022-01-14 DIAGNOSIS — Z79899 Other long term (current) drug therapy: Principal | ICD-10-CM

## 2022-01-14 DIAGNOSIS — Z5181 Encounter for therapeutic drug level monitoring: Principal | ICD-10-CM

## 2022-01-14 DIAGNOSIS — B20 Human immunodeficiency virus [HIV] disease: Principal | ICD-10-CM

## 2022-01-14 DIAGNOSIS — Z113 Encounter for screening for infections with a predominantly sexual mode of transmission: Principal | ICD-10-CM

## 2022-01-14 DIAGNOSIS — Z9189 Other specified personal risk factors, not elsewhere classified: Principal | ICD-10-CM

## 2022-01-14 MED ORDER — BICTEGRAVIR 50 MG-EMTRICITABINE 200 MG-TENOFOVIR ALAFENAM 25 MG TABLET
ORAL_TABLET | Freq: Every day | ORAL | 5 refills | 30 days | Status: CP
Start: 2022-01-14 — End: ?

## 2022-01-17 NOTE — Congregational Nurse Program (Signed)
  Dept: [phone]   Congregational Nurse Program Note  Date of Encounter: 01/17/2022  Past Medical History: Past Medical History:  Diagnosis Date   HIV (human immunodeficiency virus infection) ([place])     Encounter Details:  CNP Questionnaire - 01/17/22 1548       Questionnaire   Do you give verbal consent to treat you today? Yes    Location Patient [place]    Visit Setting Phone/Text/Email    Patient Status Immigrant    Insurance Uninsured ([place] Card/Care Connects/Self-Pay)    Insurance Referral N/A    Medication Have Medication Insecurities    Medical Provider Yes    Screening Referrals N/A    Medical Referral Other   [place] [facility], [place], [facility] clinic   Medical Appointment Made N/A    Food N/A    Transportation N/A    Housing/Utilities N/A    Interpersonal Safety N/A    Intervention Case Management;Navigate Healthcare System    ED Visit Averted N/A    Life-Saving Intervention Made N/A             Patient was taken hygiene products to his home. Adult diapers, and wipes.

## 2022-02-21 NOTE — Congregational Nurse Program (Signed)
  Dept: [phone]   Congregational Nurse Program Note  Date of Encounter: 02/21/2022  Past Medical History: Past Medical History:  Diagnosis Date   HIV (human immunodeficiency virus infection) ([place])     Encounter Details:  CNP Questionnaire - 02/21/22 1318       Questionnaire   Ask client: Do you give verbal consent for me to treat you today? Yes    Student Assistance [employer] Nurse   Maryjean Morn and vang   Location Patient [place]    Visit Setting with Client Phone/Text/Email    Patient Status Unknown    Insurance Unknown    Insurance/Financial Assistance Referral N/A    Medication Have Medication Insecurities    Medical Provider Yes    Screening Referrals Made N/A    Medical Referrals Made N/A    Medical Appointment Made N/A    Recently w/o PCP, now 1st time PCP visit completed due to CNs referral or appointment made N/A    Food N/A    Transportation N/A    Housing/Utilities N/A    Interpersonal Safety N/A    Interventions Counsel;Case Management    Abnormal to Normal Screening Since Last CN Visit N/A    Screenings CN Performed N/A    Sent Client to Lab for: N/A    Did client attend any of the following based off CNs referral or appointments made? N/A    ED Visit Averted N/A    Life-Saving Intervention Made N/A            Client was called to follow up with and assessed hygiene product needs and food needs. Patient states they just received a food donation. Will continue to f/u with client

## 2022-02-25 ENCOUNTER — Ambulatory Visit
Admit: 2022-02-25 | Discharge: 2022-02-26 | Attending: Spinal Cord Injury Medicine | Primary: Spinal Cord Injury Medicine

## 2022-02-25 ENCOUNTER — Institutional Professional Consult (permissible substitution): Admit: 2022-02-25 | Discharge: 2022-02-26

## 2022-02-25 DIAGNOSIS — G373 Acute transverse myelitis in demyelinating disease of central nervous system: Principal | ICD-10-CM

## 2022-02-25 DIAGNOSIS — B582 Toxoplasma meningoencephalitis: Principal | ICD-10-CM

## 2022-02-25 DIAGNOSIS — G8222 Paraplegia, incomplete: Principal | ICD-10-CM

## 2022-02-25 DIAGNOSIS — Z23 Encounter for immunization: Principal | ICD-10-CM

## 2022-03-21 NOTE — Congregational Nurse Program (Signed)
  Dept: [phone]   Congregational Nurse Program Note  Date of Encounter: 03/21/2022  Past Medical History: Past Medical History:  Diagnosis Date   HIV (human immunodeficiency virus infection) (HCC)     Encounter Details:  CNP Questionnaire - 03/21/22 1417       Questionnaire   Ask client: Do you give verbal consent for me to treat you today? Yes    Student Assistance N/A    Location Patient Served  [facility]    Visit Setting with Client Phone/Text/Email    Patient Status Unknown    Insurance Uninsured ([place] Card/Care Connects/Self-Pay/Medicaid Family Planning)    Insurance/Financial Assistance Referral N/A    Medication N/A    Medical Provider Yes    Screening Referrals Made N/A    Medical Referrals Made N/A    Medical Appointment Made N/A    Recently w/o PCP, now 1st time PCP visit completed due to CNs referral or appointment made N/A    Food N/A    Transportation Need transportation assistance    Housing/Utilities N/A    Interpersonal Safety N/A    Interventions Advocate/Support    Abnormal to Normal Screening Since Last CN Visit N/A    Screenings CN Performed N/A    Did client attend any of the following based off CNs referral or appointments made? N/A    ED Visit Averted N/A    Life-Saving Intervention Made N/A             Client was called twice, no answer. Will attempt call at a later time.

## 2022-03-28 ENCOUNTER — Ambulatory Visit: Admit: 2022-03-28 | Discharge: 2022-03-29 | Attending: Urology | Primary: Urology

## 2022-03-28 DIAGNOSIS — R339 Retention of urine, unspecified: Principal | ICD-10-CM

## 2022-03-28 MED ORDER — SILDENAFIL (PULMONARY HYPERTENSION) 20 MG TABLET
ORAL_TABLET | Freq: Every day | ORAL | 3 refills | 90 days | Status: CP | PRN
Start: 2022-03-28 — End: ?

## 2022-04-03 NOTE — Congregational Nurse Program (Signed)
  Dept: [phone]   Congregational Nurse Program Note  Date of Encounter: 04/03/2022  Past Medical History: Past Medical History:  Diagnosis Date   HIV (human immunodeficiency virus infection) (HCC)     Encounter Details:  CNP Questionnaire - 04/03/22 1056       Questionnaire   Ask client: Do you give verbal consent for me to treat you today? Yes    Student Assistance N/A    Location Patient Served  [facility]    Visit Setting with Client Phone/Text/Email    Patient Status Unknown    Insurance Uninsured ([place] Card/Care Connects/Self-Pay/Medicaid Family Planning)    Insurance/Financial Assistance Referral N/A    Medication N/A    Medical Provider Yes    Screening Referrals Made N/A    Medical Referrals Made N/A    Medical Appointment Made N/A    Recently w/o PCP, now 1st time PCP visit completed due to CNs referral or appointment made N/A    Food N/A    Transportation Need transportation assistance    Housing/Utilities N/A    Interpersonal Safety N/A    Interventions Case Management;Advocate/Support    Abnormal to Normal Screening Since Last CN Visit N/A    Screenings CN Performed N/A    Sent Client to Lab for: N/A    Did client attend any of the following based off CNs referral or appointments made? N/A    ED Visit Averted N/A    Life-Saving Intervention Made N/A            Client in need of Lubricant for his catheter insertion. Will drop this off to client on 04/04/2022.

## 2022-04-25 ENCOUNTER — Ambulatory Visit
Admit: 2022-04-25 | Attending: Rehabilitative and Restorative Service Providers" | Primary: Rehabilitative and Restorative Service Providers"

## 2022-05-09 NOTE — Congregational Nurse Program (Signed)
  Dept: [phone]   Congregational Nurse Program Note  Date of Encounter: 05/09/2022  Past Medical History: Past Medical History:  Diagnosis Date   HIV (human immunodeficiency virus infection) ([place])     Encounter Details:  CNP Questionnaire - 05/09/22 1527       Questionnaire   Ask client: Do you give verbal consent for me to treat you today? Yes    Insurance underwriter    Location Patient [place]    Visit Setting with Client Phone/Text/Email    Patient Status Unknown    Insurance Uninsured ([place] Card/Care Connects/Self-Pay/Medicaid Family Planning)    Insurance/Financial Assistance Referral N/A    Medication Have Medication Insecurities    Medical Provider Yes    Screening Referrals Made N/A    Medical Referrals Made N/A    Medical Appointment Made N/A    Recently w/o PCP, now 1st time PCP visit completed due to CNs referral or appointment made N/A    Food N/A    Transportation N/A    Housing/Utilities N/A    Interpersonal Safety N/A    Interventions Counsel    Abnormal to Normal Screening Since Last CN Visit N/A    Screenings CN Performed N/A    Sent Client to Lab for: N/A    Did client attend any of the following based off CNs referral or appointments made? N/A    ED Visit Averted N/A    Life-Saving Intervention Made N/A             Client needed [facility] with [facility]. [facility] answered clients questions, has an appt on 05/09/22 at [place] at the [facility]. Client to attend.

## 2022-05-13 NOTE — Congregational Nurse Program (Signed)
  Dept: [phone]   Congregational Nurse Program Note  Date of Encounter: 05/13/2022  Past Medical History: Past Medical History:  Diagnosis Date   HIV (human immunodeficiency virus infection) ([place])     Encounter Details:  CNP Questionnaire - 05/13/22 1534       Questionnaire   Ask client: Do you give verbal consent for me to treat you today? Yes    Insurance underwriter    Location Patient [place]    Visit Setting with Client Phone/Text/Email    Patient Status Unknown    Insurance Uninsured ([place] Card/Care Connects/Self-Pay/Medicaid Family Planning)    Insurance/Financial Assistance Referral N/A    Medication Have Medication Insecurities    Medical Provider Yes    Screening Referrals Made N/A    Medical Referrals Made N/A    Medical Appointment Made [facility] PCP[facility]clinic    Recently w/o PCP, now 1st time PCP visit completed due to CNs referral or appointment made N/A    Food N/A    Transportation N/A    Housing/Utilities N/A    Interpersonal Safety N/A    Interventions Counsel    Abnormal to Normal Screening Since Last CN Visit N/A    Screenings CN Performed N/A    Sent Client to Lab for: N/A    Did client attend any of the following based off CNs referral or appointments made? N/A    ED Visit Averted N/A    Life-Saving Intervention Made N/A             Made annual exam appt for client for 05/13/2022 at 5:20pm. Patient in need of urinary catheter.

## 2022-05-16 DIAGNOSIS — R339 Retention of urine, unspecified: Principal | ICD-10-CM

## 2022-05-16 NOTE — Congregational Nurse Program (Signed)
  Dept: [phone]   Congregational Nurse Program Note  Date of Encounter: 05/16/2022  Past Medical History: Past Medical History:  Diagnosis Date   HIV (human immunodeficiency virus infection) ([place])     Encounter Details:  CNP Questionnaire - 05/16/22 1535       Questionnaire   Ask client: Do you give verbal consent for me to treat you today? Yes    Insurance underwriter    Location Patient [place]    Visit Setting with Client Phone/Text/Email    Patient Status Unknown    Insurance Unknown    Insurance/Financial Assistance Referral N/A    Medication Have Medication Insecurities    Medical Provider Yes    Screening Referrals Made N/A    Medical Referrals Made [facility] PCP[facility]Clinic    Medical Appointment Made N/A    Recently w/o PCP, now 1st time PCP visit completed due to CNs referral or appointment made Yes    Food N/A    Transportation Need transportation assistance    Housing/Utilities N/A    Interpersonal Safety N/A    Interventions [place];Reviewed Medications;Case Management    Abnormal to Normal Screening Since Last CN Visit N/A    Screenings CN Performed N/A    Sent Client to Lab for: N/A    Did client attend any of the following based off CNs referral or appointments made? Yes;Medical    ED Visit Averted N/A    Life-Saving Intervention Made N/A             Made appt for client for 5:20pm 05/13/22 to see provider for catheter prescription, provider unable to write script for catheters at [facility] clinic in [place]. Urology at [facility] was called to get a catheter Rx, they stated they wold give me a call back. Client informed clinic he only had enough catheters for 10 more days. Clinic stated he could pick up catheters at the [facility] clinic.  [facility] Dr. Donella Stade has funds available from [facility] to buy client catheters, however we need his urologist to write him a Rx.

## 2022-05-17 ENCOUNTER — Other Ambulatory Visit: Payer: Self-pay | Admitting: Pharmacy Technician

## 2022-05-17 ENCOUNTER — Other Ambulatory Visit: Payer: Self-pay

## 2022-05-17 NOTE — Progress Notes (Signed)
Received prescription from [facility] for Patient Catheters.  [place] at [facility] unable to provide assistance.  Patient is not seeing a [place] provider.  Attempted to contact patient to make aware.  Also, sent referral to [facility] [place] to screen patient for [place] HIV Medication Assistance Program.  Jacquelynn Cree Patient Advocate Specialist [place] at [facility]

## 2022-05-20 ENCOUNTER — Ambulatory Visit: Admit: 2022-05-20 | Discharge: 2022-05-20

## 2022-06-10 ENCOUNTER — Ambulatory Visit: Admit: 2022-06-10 | Discharge: 2022-06-11

## 2022-06-10 DIAGNOSIS — B20 Human immunodeficiency virus [HIV] disease: Principal | ICD-10-CM

## 2022-06-10 DIAGNOSIS — Z79899 Other long term (current) drug therapy: Principal | ICD-10-CM

## 2022-06-10 DIAGNOSIS — Z5181 Encounter for therapeutic drug level monitoring: Principal | ICD-10-CM

## 2022-06-10 DIAGNOSIS — Z111 Encounter for screening for respiratory tuberculosis: Principal | ICD-10-CM

## 2022-06-10 DIAGNOSIS — Z9189 Other specified personal risk factors, not elsewhere classified: Principal | ICD-10-CM

## 2022-06-18 NOTE — Congregational Nurse Program (Signed)
  Dept: [phone]   Congregational Nurse Program Note  Date of Encounter: 06/13/2022  Past Medical History: Past Medical History:  Diagnosis Date   HIV (human immunodeficiency virus infection) [facility])     Encounter Details:  CNP Questionnaire - 06/13/22 0934       Questionnaire   Ask client: Do you give verbal consent for me to treat you today? Yes    Student Assistance [employer] Nurse    Location Patient [place]    Visit Setting with Client Phone/Text/Email    Patient Status Unknown    Insurance Unknown    Insurance/Financial Assistance Referral N/A    Medication Have Medication Insecurities    Medical Provider Yes    Screening Referrals Made N/A    Medical Referrals Made [facility]inic    Medical Appointment Made [facility] PCP[facility]clinic    Recently w/o PCP, now 1st time PCP visit completed due to CNs referral or appointment made Yes    Food N/A    Transportation Need transportation assistance    Housing/Utilities N/A    Interpersonal Safety N/A    Interventions Case Management    Abnormal to Normal Screening Since Last CN Visit N/A    Screenings CN Performed N/A    Sent Client to Lab for: N/A    Did client attend any of the following based off CNs referral or appointments made? N/A    ED Visit Averted N/A    Life-Saving Intervention Made N/A              Client in need of a working used dryer, unfortunately his dryer stopped working. [facility] to [facility] for use of dryer until he can find a used dryer.

## 2022-07-18 NOTE — Congregational Nurse Program (Signed)
  Dept: [phone]   Congregational Nurse Program Note  Date of Encounter: 07/18/2022  Past Medical History: Past Medical History:  Diagnosis Date   HIV (human immunodeficiency virus infection) [facility])     Encounter Details:  CNP Questionnaire - 07/18/22 2029       Questionnaire   Ask client: Do you give verbal consent for me to treat you today? Yes    Student Assistance [employer] Nurse;Medical Student   [employer] med student   Location Patient [place]    Visit Setting with Client Phone/Text/Email;Home    Patient Status Unknown    Insurance Uninsured ([place] Card/Care Connects/Self-Pay/Medicaid Family Planning)    Insurance/Financial Assistance Referral N/A    Medication N/A    Medical Provider Yes   [facility]   Screening Referrals Made N/A    Medical Referrals Made N/A    Medical Appointment Made N/A    Recently w/o PCP, now 1st time PCP visit completed due to CNs referral or appointment made N/A    Food N/A    Transportation Need transportation assistance    Housing/Utilities N/A    Interpersonal Safety N/A    Interventions Case Management;Counsel    Abnormal to Normal Screening Since Last CN Visit N/A    Screenings CN Performed N/A    Sent Client to Lab for: N/A    Did client attend any of the following based off CNs referral or appointments made? N/A    ED Visit Averted N/A    Life-Saving Intervention Made N/A            Client in need of adult diapers size medium and wipes. Diapers and dry wipes taken to clients home. [employer] and [employer] were present at the time of the home visit.

## 2022-08-08 NOTE — Congregational Nurse Program (Signed)
  Dept: [phone]   Congregational Nurse Program Note  Date of Encounter: 08/08/2022  Past Medical History: Past Medical History:  Diagnosis Date   HIV (human immunodeficiency virus infection) (HCC)     Encounter Details:  CNP Questionnaire - 08/08/22 1417       Questionnaire   Ask client: Do you give verbal consent for me to treat you today? Yes    Chiropractor    Location Patient Served  [facility]    Visit Setting with Client Phone/Text/Email    Patient Status Unknown    Insurance Unknown    Insurance/Financial Assistance Referral N/A    Medication N/A    Medical Provider Yes    Screening Referrals Made N/A    Medical Referrals Made N/A    Medical Appointment Made N/A    Recently w/o PCP, now 1st time PCP visit completed due to CNs referral or appointment made N/A    Food N/A    Transportation N/A    Housing/Utilities N/A    Interpersonal Safety N/A    Interventions Counsel;Educate    Abnormal to Normal Screening Since Last CN Visit N/A    Screenings CN Performed N/A    Sent Client to Lab for: N/A    Did client attend any of the following based off CNs referral or appointments made? N/A    ED Visit Averted N/A    Life-Saving Intervention Made N/A            F/u with client to discuss last annual exam at [facility], states he has a follow up in May for a follow up on his diabetes medication and to talk about his blood pressure medication that was newly prescribed medication. He states he is need of catheters and lubricate. States he has been walking some with his braces, but that the braces are not tight and loosen up and he will contact Crystal from [facility].

## 2022-09-03 DIAGNOSIS — B20 Human immunodeficiency virus [HIV] disease: Principal | ICD-10-CM

## 2022-09-03 MED ORDER — BICTEGRAVIR 50 MG-EMTRICITABINE 200 MG-TENOFOVIR ALAFENAM 25 MG TABLET
ORAL_TABLET | Freq: Every day | ORAL | 5 refills | 30 days | Status: CP
Start: 2022-09-03 — End: ?

## 2022-10-17 NOTE — Congregational Nurse Program (Signed)
  Dept: [phone]   Congregational Nurse Program Note  Date of Encounter: 10/17/2022  Past Medical History: Past Medical History:  Diagnosis Date   HIV (human immunodeficiency virus infection) (HCC)     Encounter Details:  CNP Questionnaire - 10/17/22 1606       Questionnaire   Ask client: Do you give verbal consent for me to treat you today? Yes    Student Assistance N/A    Location Patient Served  [facility]    Visit Setting with Client Phone/Text/Email;Organization    Patient Status Unknown    Insurance Unknown    Insurance/Financial Assistance Referral N/A    Medication N/A    Medical Provider Yes    Screening Referrals Made N/A    Medical Referrals Made N/A    Medical Appointment Made N/A    Recently w/o PCP, now 1st time PCP visit completed due to CNs referral or appointment made N/A    Food N/A    Transportation N/A    Housing/Utilities N/A    Interpersonal Safety N/A    Interventions Case Management    Abnormal to Normal Screening Since Last CN Visit N/A    Screenings CN Performed N/A    Sent Client to Lab for: N/A    Did client attend any of the following based off CNs referral or appointments made? N/A    ED Visit Averted N/A    Life-Saving Intervention Made N/A             Wheelchair client taken adult diapers size small/medium pull-ups. Client does not have transportation to pick up products.

## 2022-11-14 NOTE — Congregational Nurse Program (Signed)
  Dept: [phone]   Congregational Nurse Program Note  Date of Encounter: 11/14/2022  Past Medical History: Past Medical History:  Diagnosis Date   HIV (human immunodeficiency virus infection) (HCC)     Encounter Details:  CNP Questionnaire - 11/14/22 1021       Questionnaire   Ask client: Do you give verbal consent for me to treat you today? Yes    Student Assistance N/A    Location Patient Served  [facility]    Visit Setting with Client Phone/Text/Email    Patient Status Unknown    Insurance Unknown    Insurance/Financial Assistance Referral N/A    Medication N/A    Medical Provider Yes    Screening Referrals Made N/A    Medical Referrals Made N/A    Medical Appointment Made N/A    Recently w/o PCP, now 1st time PCP visit completed due to CNs referral or appointment made N/A    Food N/A    Transportation N/A    Housing/Utilities N/A    Interpersonal Safety N/A    Interventions Case Management    Abnormal to Normal Screening Since Last CN Visit N/A    Screenings CN Performed N/A    Sent Client to Lab for: N/A    Did client attend any of the following based off CNs referral or appointments made? N/A    ED Visit Averted N/A    Life-Saving Intervention Made N/A            Client voiced interest in COVID-19 vaccine.Discussed with a client location options for obtaining a free covid vaccine. Needs lubricate for catheters, will try to obtain them today.

## 2022-12-09 ENCOUNTER — Ambulatory Visit: Admit: 2022-12-09 | Discharge: 2022-12-10 | Attending: Family | Primary: Family

## 2022-12-09 DIAGNOSIS — Z79899 Other long term (current) drug therapy: Principal | ICD-10-CM

## 2022-12-09 DIAGNOSIS — Z5181 Encounter for therapeutic drug level monitoring: Principal | ICD-10-CM

## 2022-12-09 DIAGNOSIS — Z1159 Encounter for screening for other viral diseases: Principal | ICD-10-CM

## 2022-12-09 DIAGNOSIS — Z113 Encounter for screening for infections with a predominantly sexual mode of transmission: Principal | ICD-10-CM

## 2022-12-09 DIAGNOSIS — Z9189 Other specified personal risk factors, not elsewhere classified: Principal | ICD-10-CM

## 2022-12-09 DIAGNOSIS — B20 Human immunodeficiency virus [HIV] disease: Principal | ICD-10-CM

## 2022-12-09 MED ORDER — BICTEGRAVIR 50 MG-EMTRICITABINE 200 MG-TENOFOVIR ALAFENAM 25 MG TABLET
ORAL_TABLET | Freq: Every day | ORAL | 11 refills | 30 days | Status: CP
Start: 2022-12-09 — End: ?

## 2023-06-05 NOTE — Congregational Nurse Program (Signed)
  Dept: [phone]   Congregational Nurse Program Note  Date of Encounter: 06/05/2023  Past Medical History: Past Medical History:  Diagnosis Date   HIV (human immunodeficiency virus infection) [facility])     Encounter Details:  Community Questionnaire - 06/05/23 1138       Questionnaire   Ask client: Do you give verbal consent for me to treat you today? Yes    Student Assistance N/A    Location Patient Served  [facility]    Encounter Setting Phone/Text/Email;Home    Population Status Unknown    Insurance Unknown    Insurance/Financial Assistance Referral N/A    Medication N/A    Medical Provider Yes    Screening Referrals Made N/A    Medical Referrals Made N/A    Medical Appointment Completed N/A    CNP Interventions Case Management    Screenings CN Performed N/A    ED Visit Averted N/A    Life-Saving Intervention Made N/A             Client in need of adult medium sized diapers, to be delivered to home today.

## 2023-06-16 ENCOUNTER — Ambulatory Visit: Admit: 2023-06-16 | Discharge: 2023-06-17 | Attending: Family | Primary: Family

## 2023-06-16 DIAGNOSIS — Z79899 Other long term (current) drug therapy: Principal | ICD-10-CM

## 2023-06-16 DIAGNOSIS — Z5181 Encounter for therapeutic drug level monitoring: Principal | ICD-10-CM

## 2023-06-16 DIAGNOSIS — Z23 Encounter for immunization: Principal | ICD-10-CM

## 2023-06-16 DIAGNOSIS — Z21 Asymptomatic human immunodeficiency virus [HIV] infection status: Principal | ICD-10-CM

## 2023-06-16 DIAGNOSIS — Z1151 Encounter for screening for human papillomavirus (HPV): Principal | ICD-10-CM

## 2023-06-16 DIAGNOSIS — Z9189 Other specified personal risk factors, not elsewhere classified: Principal | ICD-10-CM

## 2023-06-16 DIAGNOSIS — B20 Human immunodeficiency virus [HIV] disease: Principal | ICD-10-CM

## 2023-06-16 MED ORDER — BICTEGRAVIR 50 MG-EMTRICITABINE 200 MG-TENOFOVIR ALAFENAM 25 MG TABLET
ORAL_TABLET | Freq: Every day | ORAL | 11 refills | 30.00 days | Status: CP
Start: 2023-06-16 — End: ?

## 2023-07-23 NOTE — Congregational Nurse Program (Signed)
  Dept: [phone]   Congregational Nurse Program Note  Date of Encounter: 07/23/2023  Past Medical History: Past Medical History:  Diagnosis Date   HIV (human immunodeficiency virus infection) [facility])     Encounter Details:  Community Questionnaire - 07/23/23 1009       Questionnaire   Ask client: Do you give verbal consent for me to treat you today? Yes    Student Assistance N/A    Location Patient Served  [facility]    Encounter Setting Phone/Text/Email;Home    Population Status Unknown    Insurance Unknown    Insurance/Financial Assistance Referral N/A    Medication N/A    Medical Provider Yes    Screening Referrals Made N/A    Medical Referrals Made N/A    Medical Appointment Completed N/A    CNP Interventions N/A    Screenings CN Performed N/A    ED Visit Averted N/A    Life-Saving Intervention Made N/A              Set up transportation for client for the next 6 weeks to attend pro bono physical therapy at [facility] which is located at the [place] at [address] [place]. Client will be going through [employer] transportation. Transport arranged. Client called and informed to ready by 11am, pick up time between 11:15am and 11:30am.

## 2023-08-28 ENCOUNTER — Ambulatory Visit: Admit: 2023-08-28 | Discharge: 2023-08-29 | Attending: Urology | Primary: Urology

## 2023-08-28 DIAGNOSIS — N319 Neuromuscular dysfunction of bladder, unspecified: Principal | ICD-10-CM

## 2023-08-28 DIAGNOSIS — R339 Retention of urine, unspecified: Principal | ICD-10-CM

## 2023-08-28 DIAGNOSIS — N528 Other male erectile dysfunction: Principal | ICD-10-CM

## 2023-08-28 MED ORDER — SILDENAFIL 100 MG TABLET
ORAL_TABLET | Freq: Every day | ORAL | 11 refills | 30.00000 days | Status: CP | PRN
Start: 2023-08-28 — End: 2023-09-27

## 2023-12-15 ENCOUNTER — Encounter: Admit: 2023-12-15 | Discharge: 2023-12-15 | Attending: Family | Primary: Family

## 2023-12-15 DIAGNOSIS — Z23 Encounter for immunization: Principal | ICD-10-CM

## 2023-12-15 DIAGNOSIS — K0889 Other specified disorders of teeth and supporting structures: Principal | ICD-10-CM

## 2023-12-15 DIAGNOSIS — Z5181 Encounter for therapeutic drug level monitoring: Principal | ICD-10-CM

## 2023-12-15 DIAGNOSIS — Z79899 Other long term (current) drug therapy: Principal | ICD-10-CM

## 2023-12-15 DIAGNOSIS — Z113 Encounter for screening for infections with a predominantly sexual mode of transmission: Principal | ICD-10-CM

## 2023-12-15 DIAGNOSIS — Z8719 Personal history of other diseases of the digestive system: Principal | ICD-10-CM

## 2023-12-15 DIAGNOSIS — Z21 Asymptomatic human immunodeficiency virus [HIV] infection status: Principal | ICD-10-CM

## 2023-12-15 DIAGNOSIS — B20 Human immunodeficiency virus [HIV] disease: Principal | ICD-10-CM

## 2023-12-15 DIAGNOSIS — Z9189 Other specified personal risk factors, not elsewhere classified: Principal | ICD-10-CM

## 2023-12-15 DIAGNOSIS — Z0184 Encounter for antibody response examination: Principal | ICD-10-CM

## 2023-12-15 DIAGNOSIS — E119 Type 2 diabetes mellitus without complications: Principal | ICD-10-CM

## 2023-12-15 MED ORDER — BICTEGRAVIR 50 MG-EMTRICITABINE 200 MG-TENOFOVIR ALAFENAM 25 MG TABLET
ORAL_TABLET | Freq: Every day | ORAL | 11 refills | 30.00000 days | Status: CP
Start: 2023-12-15 — End: ?
